# Patient Record
Sex: Female | Born: 1935 | Race: White | Hispanic: No | Smoking: Former smoker
Health system: Southern US, Community
[De-identification: ages and names within clinical notes are randomized; demographics above are authoritative.]

## PROBLEM LIST (undated history)

## (undated) DIAGNOSIS — E039 Hypothyroidism, unspecified: Secondary | ICD-10-CM

## (undated) DIAGNOSIS — Z952 Presence of prosthetic heart valve: Secondary | ICD-10-CM

## (undated) DIAGNOSIS — Z7901 Long term (current) use of anticoagulants: Secondary | ICD-10-CM

## (undated) DIAGNOSIS — M810 Age-related osteoporosis without current pathological fracture: Secondary | ICD-10-CM

## (undated) DIAGNOSIS — I639 Cerebral infarction, unspecified: Secondary | ICD-10-CM

## (undated) DIAGNOSIS — I517 Cardiomegaly: Secondary | ICD-10-CM

## (undated) DIAGNOSIS — F0150 Vascular dementia without behavioral disturbance: Secondary | ICD-10-CM

## (undated) DIAGNOSIS — I10 Essential (primary) hypertension: Secondary | ICD-10-CM

## (undated) DIAGNOSIS — I272 Pulmonary hypertension, unspecified: Secondary | ICD-10-CM

## (undated) DIAGNOSIS — I4821 Permanent atrial fibrillation: Secondary | ICD-10-CM

## (undated) DIAGNOSIS — N390 Urinary tract infection, site not specified: Secondary | ICD-10-CM

## (undated) HISTORY — PX: ABDOMINAL HYSTERECTOMY: SHX81

## (undated) HISTORY — PX: FRACTURE SURGERY: SHX138

## (undated) HISTORY — DX: Age-related osteoporosis without current pathological fracture: M81.0

## (undated) HISTORY — PX: MITRAL VALVE REPLACEMENT: SHX147

---

## 2015-07-06 DIAGNOSIS — N390 Urinary tract infection, site not specified: Secondary | ICD-10-CM

## 2015-07-06 HISTORY — DX: Urinary tract infection, site not specified: N39.0

## 2015-07-12 ENCOUNTER — Encounter (HOSPITAL_COMMUNITY): Payer: Self-pay

## 2015-07-12 ENCOUNTER — Emergency Department (HOSPITAL_COMMUNITY): Payer: Medicare Other

## 2015-07-12 ENCOUNTER — Inpatient Hospital Stay (HOSPITAL_COMMUNITY)
Admission: EM | Admit: 2015-07-12 | Discharge: 2015-07-14 | DRG: 690 | Disposition: A | Discharge status: Home / Self Care | Payer: Medicare Other | Attending: Internal Medicine | Admitting: Internal Medicine

## 2015-07-12 DIAGNOSIS — N390 Urinary tract infection, site not specified: Secondary | ICD-10-CM | POA: Diagnosis present

## 2015-07-12 DIAGNOSIS — I10 Essential (primary) hypertension: Secondary | ICD-10-CM | POA: Diagnosis present

## 2015-07-12 DIAGNOSIS — E860 Dehydration: Secondary | ICD-10-CM | POA: Diagnosis present

## 2015-07-12 DIAGNOSIS — R17 Unspecified jaundice: Secondary | ICD-10-CM | POA: Diagnosis present

## 2015-07-12 DIAGNOSIS — Z79899 Other long term (current) drug therapy: Secondary | ICD-10-CM

## 2015-07-12 DIAGNOSIS — R509 Fever, unspecified: Secondary | ICD-10-CM

## 2015-07-12 DIAGNOSIS — N179 Acute kidney failure, unspecified: Secondary | ICD-10-CM | POA: Diagnosis present

## 2015-07-12 DIAGNOSIS — D649 Anemia, unspecified: Secondary | ICD-10-CM | POA: Diagnosis present

## 2015-07-12 DIAGNOSIS — Z7901 Long term (current) use of anticoagulants: Secondary | ICD-10-CM | POA: Diagnosis not present

## 2015-07-12 DIAGNOSIS — F0150 Vascular dementia without behavioral disturbance: Secondary | ICD-10-CM | POA: Diagnosis present

## 2015-07-12 DIAGNOSIS — Z8673 Personal history of transient ischemic attack (TIA), and cerebral infarction without residual deficits: Secondary | ICD-10-CM

## 2015-07-12 DIAGNOSIS — B9620 Unspecified Escherichia coli [E. coli] as the cause of diseases classified elsewhere: Secondary | ICD-10-CM | POA: Diagnosis present

## 2015-07-12 DIAGNOSIS — D696 Thrombocytopenia, unspecified: Secondary | ICD-10-CM | POA: Diagnosis present

## 2015-07-12 DIAGNOSIS — Z952 Presence of prosthetic heart valve: Secondary | ICD-10-CM

## 2015-07-12 DIAGNOSIS — Z7902 Long term (current) use of antithrombotics/antiplatelets: Secondary | ICD-10-CM | POA: Diagnosis not present

## 2015-07-12 DIAGNOSIS — R112 Nausea with vomiting, unspecified: Secondary | ICD-10-CM | POA: Diagnosis present

## 2015-07-12 DIAGNOSIS — N289 Disorder of kidney and ureter, unspecified: Secondary | ICD-10-CM

## 2015-07-12 HISTORY — DX: Vascular dementia, unspecified severity, without behavioral disturbance, psychotic disturbance, mood disturbance, and anxiety: F01.50

## 2015-07-12 HISTORY — DX: Essential (primary) hypertension: I10

## 2015-07-12 HISTORY — DX: Hypothyroidism, unspecified: E03.9

## 2015-07-12 HISTORY — DX: Presence of prosthetic heart valve: Z95.2

## 2015-07-12 HISTORY — DX: Cerebral infarction, unspecified: I63.9

## 2015-07-12 LAB — COMPREHENSIVE METABOLIC PANEL
ALK PHOS: 67 U/L (ref 38–126)
ALT: 20 U/L (ref 14–54)
AST: 33 U/L (ref 15–41)
Albumin: 4 g/dL (ref 3.5–5.0)
Anion gap: 9 (ref 5–15)
BUN: 28 mg/dL — ABNORMAL HIGH (ref 6–20)
CALCIUM: 9.6 mg/dL (ref 8.9–10.3)
CO2: 28 mmol/L (ref 22–32)
CREATININE: 1.41 mg/dL — AB (ref 0.44–1.00)
Chloride: 99 mmol/L — ABNORMAL LOW (ref 101–111)
GFR, EST AFRICAN AMERICAN: 38 mL/min — AB (ref >60)
GFR, EST NON AFRICAN AMERICAN: 33 mL/min — AB (ref >60)
Glucose, Bld: 224 mg/dL — ABNORMAL HIGH (ref 65–99)
Potassium: 3.9 mmol/L (ref 3.5–5.1)
Sodium: 136 mmol/L (ref 135–145)
Total Bilirubin: 1.6 mg/dL — ABNORMAL HIGH (ref 0.3–1.2)
Total Protein: 6.8 g/dL (ref 6.5–8.1)

## 2015-07-12 LAB — URINALYSIS, ROUTINE W REFLEX MICROSCOPIC
BILIRUBIN URINE: NEGATIVE
Glucose, UA: NEGATIVE mg/dL
NITRITE: POSITIVE — AB
PH: 6 (ref 5.0–8.0)
Protein, ur: 100 mg/dL — AB
Specific Gravity, Urine: 1.02 (ref 1.005–1.030)

## 2015-07-12 LAB — LIPASE, BLOOD: LIPASE: 28 U/L (ref 11–51)

## 2015-07-12 LAB — URINE MICROSCOPIC-ADD ON

## 2015-07-12 LAB — LACTIC ACID, PLASMA: Lactic Acid, Venous: 2.5 mmol/L (ref 0.5–2.0)

## 2015-07-12 LAB — CBC WITH DIFFERENTIAL/PLATELET
Basophils Absolute: 0 10*3/uL (ref 0.0–0.1)
Basophils Relative: 0 %
EOS PCT: 0 %
Eosinophils Absolute: 0 10*3/uL (ref 0.0–0.7)
HEMATOCRIT: 37.5 % (ref 36.0–46.0)
HEMOGLOBIN: 12.4 g/dL (ref 12.0–15.0)
LYMPHS ABS: 0.7 10*3/uL (ref 0.7–4.0)
LYMPHS PCT: 5 %
MCH: 28.8 pg (ref 26.0–34.0)
MCHC: 33.1 g/dL (ref 30.0–36.0)
MCV: 87 fL (ref 78.0–100.0)
Monocytes Absolute: 0.9 10*3/uL (ref 0.1–1.0)
Monocytes Relative: 6 %
NEUTROS PCT: 89 %
Neutro Abs: 12.9 10*3/uL — ABNORMAL HIGH (ref 1.7–7.7)
Platelets: 141 10*3/uL — ABNORMAL LOW (ref 150–400)
RBC: 4.31 MIL/uL (ref 3.87–5.11)
RDW: 14.6 % (ref 11.5–15.5)
WBC: 14.5 10*3/uL — AB (ref 4.0–10.5)

## 2015-07-12 LAB — PROTIME-INR
INR: 2.34 — ABNORMAL HIGH (ref 0.00–1.49)
Prothrombin Time: 25.4 seconds — ABNORMAL HIGH (ref 11.6–15.2)

## 2015-07-12 LAB — TROPONIN I

## 2015-07-12 MED ORDER — WARFARIN SODIUM 2 MG PO TABS: 2.0000 mg | ORAL_TABLET | Freq: Every day | ORAL | Status: DC

## 2015-07-12 MED ORDER — LISINOPRIL 5 MG PO TABS
2.5000 mg | ORAL_TABLET | Freq: Every day | ORAL | Status: DC
Start: 2015-07-12 — End: 2015-07-12

## 2015-07-12 MED ORDER — CLOPIDOGREL BISULFATE 75 MG PO TABS
75.0000 mg | ORAL_TABLET | Freq: Every day | ORAL | Status: DC
Start: 2015-07-12 — End: 2015-07-12

## 2015-07-12 MED ORDER — LISINOPRIL 5 MG PO TABS
2.5000 mg | ORAL_TABLET | Freq: Every day | ORAL | Status: DC
  Administered 2015-07-13 – 2015-07-14 (×2): 2.5 mg via ORAL
  Filled 2015-07-12 (×2): qty 1

## 2015-07-12 MED ORDER — ACETAMINOPHEN 500 MG PO TABS
500.0000 mg | ORAL_TABLET | Freq: Four times a day (QID) | ORAL | Status: DC | PRN
Start: 2015-07-12 — End: 2015-07-14

## 2015-07-12 MED ORDER — SODIUM CHLORIDE 0.9 % IV SOLN: INTRAVENOUS | Status: DC

## 2015-07-12 MED ORDER — CIPROFLOXACIN IN D5W 400 MG/200ML IV SOLN
400.0000 mg | Freq: Two times a day (BID) | INTRAVENOUS | Status: DC
  Administered 2015-07-12: 400 mg via INTRAVENOUS

## 2015-07-12 MED ORDER — VITAMIN C 500 MG PO TABS
500.0000 mg | ORAL_TABLET | Freq: Two times a day (BID) | ORAL | Status: DC
  Administered 2015-07-12 – 2015-07-14 (×4): 500 mg via ORAL
  Filled 2015-07-12 (×8): qty 1

## 2015-07-12 MED ORDER — ATENOLOL 25 MG PO TABS
25.0000 mg | ORAL_TABLET | Freq: Every day | ORAL | Status: DC
  Administered 2015-07-13 – 2015-07-14 (×2): 25 mg via ORAL
  Filled 2015-07-12 (×2): qty 1

## 2015-07-12 MED ORDER — WARFARIN - PHARMACIST DOSING INPATIENT
Freq: Every day | Status: DC
  Administered 2015-07-13: 18:00:00

## 2015-07-12 MED ORDER — ATENOLOL 25 MG PO TABS: 25.0000 mg | ORAL_TABLET | Freq: Every day | ORAL | Status: DC

## 2015-07-12 MED ORDER — CLOPIDOGREL BISULFATE 75 MG PO TABS
75.0000 mg | ORAL_TABLET | Freq: Every day | ORAL | Status: DC
  Administered 2015-07-13 – 2015-07-14 (×2): 75 mg via ORAL
  Filled 2015-07-12 (×2): qty 1

## 2015-07-12 MED ORDER — WARFARIN SODIUM 2 MG PO TABS
2.0000 mg | ORAL_TABLET | Freq: Once | ORAL | Status: AC
  Administered 2015-07-12: 2 mg via ORAL
  Filled 2015-07-12 (×2): qty 1

## 2015-07-12 MED ORDER — CIPROFLOXACIN IN D5W 400 MG/200ML IV SOLN
400.0000 mg | INTRAVENOUS | Status: DC
  Administered 2015-07-13 – 2015-07-14 (×2): 400 mg via INTRAVENOUS
  Filled 2015-07-12 (×3): qty 200

## 2015-07-12 MED ORDER — PROMETHAZINE HCL 25 MG/ML IJ SOLN
12.5000 mg | Freq: Four times a day (QID) | INTRAMUSCULAR | Status: DC | PRN
  Administered 2015-07-12: 12.5 mg via INTRAVENOUS
  Filled 2015-07-12: qty 1

## 2015-07-12 MED ORDER — TRIAMTERENE-HCTZ 37.5-25 MG PO CAPS
1.0000 | ORAL_CAPSULE | Freq: Every day | ORAL | Status: DC
  Filled 2015-07-12 (×2): qty 1

## 2015-07-12 MED ORDER — AMLODIPINE BESYLATE 5 MG PO TABS
2.5000 mg | ORAL_TABLET | Freq: Every day | ORAL | Status: DC
  Administered 2015-07-13 – 2015-07-14 (×2): 2.5 mg via ORAL
  Filled 2015-07-12 (×2): qty 1

## 2015-07-12 MED ORDER — AMLODIPINE BESYLATE 5 MG PO TABS: 2.5000 mg | ORAL_TABLET | Freq: Every day | ORAL | Status: DC

## 2015-07-12 MED ORDER — LEVOTHYROXINE SODIUM 25 MCG PO TABS
125.0000 ug | ORAL_TABLET | Freq: Every day | ORAL | Status: DC
  Administered 2015-07-13 – 2015-07-14 (×2): 125 ug via ORAL
  Filled 2015-07-12 (×2): qty 1

## 2015-07-12 MED ORDER — B COMPLEX-C PO TABS
1.0000 | ORAL_TABLET | Freq: Every day | ORAL | Status: DC
  Administered 2015-07-13 – 2015-07-14 (×2): 1 via ORAL
  Filled 2015-07-12 (×5): qty 1

## 2015-07-12 MED ORDER — ACETAMINOPHEN 325 MG PO TABS
650.0000 mg | ORAL_TABLET | Freq: Once | ORAL | Status: AC
  Administered 2015-07-12: 650 mg via ORAL
  Filled 2015-07-12: qty 2

## 2015-07-12 MED ORDER — SODIUM CHLORIDE 0.9 % IV SOLN
INTRAVENOUS | Status: DC
  Administered 2015-07-12 – 2015-07-13 (×2): via INTRAVENOUS

## 2015-07-12 MED ORDER — DOCUSATE SODIUM 100 MG PO CAPS
100.0000 mg | ORAL_CAPSULE | Freq: Two times a day (BID) | ORAL | Status: DC
  Administered 2015-07-12 – 2015-07-14 (×4): 100 mg via ORAL
  Filled 2015-07-12 (×8): qty 1

## 2015-07-12 MED ORDER — ONDANSETRON HCL 4 MG/2ML IJ SOLN
4.0000 mg | Freq: Four times a day (QID) | INTRAMUSCULAR | Status: DC | PRN
  Administered 2015-07-12: 4 mg via INTRAVENOUS
  Filled 2015-07-12: qty 2

## 2015-07-12 NOTE — ED Provider Notes (Signed)
CSN: [id number]     Arrival date & time 07/12/15  1255 History   First MD Initiated Contact with Patient 07/12/15 1315     Chief Complaint  Patient presents with  . Fever  . Emesis  . Fatigue      Patient is a 81 y.o. female presenting with fever and vomiting. The history is provided by the patient and a caregiver. The history is limited by the condition of the patient (hx dementia).  Fever Associated symptoms: vomiting   Emesis Pt was seen at 1405. Per pt and her family, c/o gradual onset and persistence of constant home fever to "102" (PO) and several episodes of N/V since this morning. Family states pt has been generally weak for the past several days, worse today. No medications were given to tx pt's fever. Pt herself denies abd pain, no CP/SOB, no cough, no back pain. No reported falls, no focal motor weakness.    Past Medical History  Diagnosis Date  . Stroke ([place])   . Hypertension   . Vascular dementia    Past Surgical History  Procedure Laterality Date  . Mitral valve replacement    . Abdominal hysterectomy      Social History  Substance Use Topics  . Smoking status: Never Smoker   . Smokeless tobacco: None  . Alcohol Use: No    Review of Systems  Unable to perform ROS: Dementia  Constitutional: Positive for fever.  Gastrointestinal: Positive for vomiting.      Allergies  Penicillins  Home Medications   Prior to Admission medications   Medication Sig Start Date End Date Taking? Authorizing Provider  acetaminophen (TYLENOL) 500 MG tablet Take 500 mg by mouth every 6 (six) hours as needed for mild pain.   Yes Historical Provider, MD  amLODipine (NORVASC) 2.5 MG tablet Take 2.5 mg by mouth daily.   Yes Historical Provider, MD  atenolol (TENORMIN) 25 MG tablet Take 12.5 mg by mouth.   Yes Historical Provider, MD  b complex vitamins tablet Take 1 tablet by mouth daily.   Yes Historical Provider, MD  clopidogrel (PLAVIX) 75 MG tablet Take 75 mg by mouth  daily.   Yes Historical Provider, MD  docusate sodium (COLACE) 100 MG capsule Take 100 mg by mouth 2 (two) times daily.   Yes Historical Provider, MD  levothyroxine (SYNTHROID, LEVOTHROID) 125 MCG tablet Take 125 mcg by mouth daily before breakfast.   Yes Historical Provider, MD  lisinopril (PRINIVIL,ZESTRIL) 2.5 MG tablet Take 2.5 mg by mouth daily.   Yes Historical Provider, MD  triamterene-hydrochlorothiazide (DYAZIDE) 37.5-25 MG capsule Take 1 capsule by mouth daily.   Yes Historical Provider, MD  vitamin C (ASCORBIC ACID) 500 MG tablet Take 500 mg by mouth 2 (two) times daily.   Yes Historical Provider, MD  warfarin (COUMADIN) 2 MG tablet Take 2 mg by mouth daily. Pt takes 2mg  one day and 4mg  the next   Yes Historical Provider, MD   BP 124/89 mmHg  Pulse 70  Temp(Src) 101.9 F (38.8 C) (Rectal)  Resp 27  Ht 5\' 2"  (1.575 m)  Wt 135 lb (61.236 kg)  BMI 24.69 kg/m2  SpO2 95%   14:17 Orthostatic Vital Signs CS  Orthostatic Lying  - BP- Lying: 112/54 mmHg ; Pulse- Lying: 74  Orthostatic Sitting - BP- Sitting: 117/55 mmHg ; Pulse- Sitting: 68  Orthostatic Standing at 0 minutes - BP- Standing at 0 minutes: 110/70 mmHg ; Pulse- Standing at 0 minutes: 60  Physical Exam  1410: Physical examination:  Nursing notes reviewed; Vital signs and O2 SAT reviewed;  Constitutional: Well developed, Well nourished, In no acute distress; Head:  Normocephalic, atraumatic; Eyes: EOMI, PERRL, No scleral icterus; ENMT: Mouth and pharynx normal, Mucous membranes dry; Neck: Supple, Full range of motion. No meningeal signs.; Cardiovascular: Irregular irregular rate and rhythm, No gallop; Respiratory: Breath sounds clear & equal bilaterally, No wheezes.  Speaking full sentences with ease, Normal respiratory effort/excursion; Chest: Nontender, Movement normal; Abdomen: Soft, Nontender, Nondistended, Normal bowel sounds; Genitourinary: No CVA tenderness; Extremities: Pulses normal, No tenderness, No edema, No  calf edema or asymmetry.; Neuro: Awake, alert, confused per hx dementia. Major CN grossly intact. No facial droop. Speech clear. No gross focal motor deficits in extremities.; Skin: Color normal, Warm, Dry.   ED Course  Procedures (including critical care time)   Labs Review Imaging Review I have personally reviewed and evaluated these images and lab results as part of my medical decision-making.   EKG Interpretation   Date/Time:  Wednesday July 12 2015 13:30:01 EST Ventricular Rate:  63 PR Interval:    QRS Duration: 94 QT Interval:  398 QTC Calculation: 407 R Axis:   49 Text Interpretation:  Atrial fibrillation Anterior infarct, old Repol  abnrm suggests ischemia, lateral leads Artifact No old tracing to compare  Confirmed by [facility]  MD, Nunzio Cory [id number]) on 07/12/2015 2:09:49 PM      MDM  MDM Reviewed: nursing note and vitals Interpretation: labs, ECG and x-ray   Results for orders placed or performed during the hospital encounter of 07/12/15  CBC with Differential  Result Value Ref Range   WBC 14.5 (H) 4.0 - 10.5 K/uL   RBC 4.31 3.87 - 5.11 MIL/uL   Hemoglobin 12.4 12.0 - 15.0 g/dL   HCT 37.5 36.0 - 46.0 %   MCV 87.0 78.0 - 100.0 fL   MCH 28.8 26.0 - 34.0 pg   MCHC 33.1 30.0 - 36.0 g/dL   RDW 14.6 11.5 - 15.5 %   Platelets 141 (L) 150 - 400 K/uL   Neutrophils Relative % 89 %   Neutro Abs 12.9 (H) 1.7 - 7.7 K/uL   Lymphocytes Relative 5 %   Lymphs Abs 0.7 0.7 - 4.0 K/uL   Monocytes Relative 6 %   Monocytes Absolute 0.9 0.1 - 1.0 K/uL   Eosinophils Relative 0 %   Eosinophils Absolute 0.0 0.0 - 0.7 K/uL   Basophils Relative 0 %   Basophils Absolute 0.0 0.0 - 0.1 K/uL  Comprehensive metabolic panel  Result Value Ref Range   Sodium 136 135 - 145 mmol/L   Potassium 3.9 3.5 - 5.1 mmol/L   Chloride 99 (L) 101 - 111 mmol/L   CO2 28 22 - 32 mmol/L   Glucose, Bld 224 (H) 65 - 99 mg/dL   BUN 28 (H) 6 - 20 mg/dL   Creatinine, Ser 1.41 (H) 0.44 - 1.00 mg/dL    Calcium 9.6 8.9 - 10.3 mg/dL   Total Protein 6.8 6.5 - 8.1 g/dL   Albumin 4.0 3.5 - 5.0 g/dL   AST 33 15 - 41 U/L   ALT 20 14 - 54 U/L   Alkaline Phosphatase 67 38 - 126 U/L   Total Bilirubin 1.6 (H) 0.3 - 1.2 mg/dL   GFR calc non Af Amer 33 (L) >60 mL/min   GFR calc Af Amer 38 (L) >60 mL/min   Anion gap 9 5 - 15  Urinalysis, Routine w reflex microscopic (not at  [facility])  Result Value Ref Range   Color, Urine YELLOW YELLOW   APPearance HAZY (A) CLEAR   Specific Gravity, Urine 1.020 1.005 - 1.030   pH 6.0 5.0 - 8.0   Glucose, UA NEGATIVE NEGATIVE mg/dL   Hgb urine dipstick LARGE (A) NEGATIVE   Bilirubin Urine NEGATIVE NEGATIVE   Ketones, ur TRACE (A) NEGATIVE mg/dL   Protein, ur 100 (A) NEGATIVE mg/dL   Nitrite POSITIVE (A) NEGATIVE   Leukocytes, UA MODERATE (A) NEGATIVE  Lipase, blood  Result Value Ref Range   Lipase 28 11 - 51 U/L  Troponin I  Result Value Ref Range   Troponin I <0.03 <0.031 ng/mL  Lactic acid, plasma  Result Value Ref Range   Lactic Acid, Venous 2.5 (HH) 0.5 - 2.0 mmol/L  Protime-INR  Result Value Ref Range   Prothrombin Time 25.4 (H) 11.6 - 15.2 seconds   INR 2.34 (H) 0.00 - 1.49  Urine microscopic-add on  Result Value Ref Range   Squamous Epithelial / LPF 0-5 (A) NONE SEEN   WBC, UA TOO NUMEROUS TO COUNT 0 - 5 WBC/hpf   RBC / HPF 6-30 0 - 5 RBC/hpf   Bacteria, UA MANY (A) NONE SEEN   Dg Abd Acute W/chest 07/12/2015  CLINICAL DATA:  Generalize weakness, vomiting and fever since this morning EXAM: DG ABDOMEN ACUTE W/ 1V CHEST COMPARISON:  None in PACs FINDINGS: The lungs are adequately inflated and clear. The cardiac silhouette is mildly enlarged. The pulmonary vascularity is normal. There are 8 intact sternal wires. There is a valve ring in the mitral position. There is no pleural effusion. The observed bony thorax is unremarkable. Within the abdomen the bowel gas pattern is unremarkable. There is moderately increased colonic stool burden in the hepatic and  splenic flexure regions. There is some stool in the rectosigmoid. There is dense calcification in the wall of the abdominal aorta and within the splenic artery. There is moderate S shaped thoracolumbar scoliosis. IMPRESSION: 1. Mild enlargement of the cardiac silhouette without evidence of pneumonia nor other acute cardiopulmonary abnormality. Status post mitral valve replacement. 2. Mildly increase colonic stool burden may reflect constipation in the appropriate clinical setting. There is no evidence of a small or large bowel obstruction or perforation. Electronically Signed   By: David  Martinique M.D.   On: 07/12/2015 15:59    1630:  Tx UTI with IV rocephin while UC is pending. APAP given for fever. Not orthostatic on VS. Judicious IVF given for mildly elevated BUN/Cr (no old to compare) as well as lactic acid.  Dx and testing d/w pt and family.  Questions answered.  Verb understanding, agreeable to admit. T/C to [facility]. Anastasio Champion, case discussed, including:  HPI, pertinent PM/SHx, VS/PE, dx testing, ED course and treatment:  Agreeable to admit, requests to write temporary orders, obtain medical bed to team APAdmits.   Francine Graven, DO 07/18/15 [phone]

## 2015-07-12 NOTE — ED Notes (Signed)
Pt reports generalized weakness, fever, and vomiting since this morning.  Pt did not have any medication for fever.  Reports temp was 102 orally.

## 2015-07-12 NOTE — ED Notes (Signed)
Report attempted. Bed assignment changed by floor charge. Floor to call back.

## 2015-07-12 NOTE — ED Notes (Signed)
CRITICAL VALUE ALERT  Critical value received: Lactic Acid 2.5 Date of notification:  07/12/2015  Time of notification:  [id number]  Critical value read back:Yes.    Nurse who received alert:  Laurell Josephs RN  MD notified (1st page):  Alvino Chapel  Time of first page:  32  MD notified (2nd page):  Time of second page:  Responding MD:  Alvino Chapel  Time MD responded:  1450

## 2015-07-12 NOTE — Progress Notes (Signed)
ANTIBIOTIC CONSULT NOTE - INITIAL  Pharmacy Consult for Cipro Indication: UTI  Allergies  Allergen Reactions  . Penicillins Swelling    Whole body "swelled up" and had to go to the hospital    Patient Measurements: Height: 5\' 2"  (157.5 cm) Weight: 135 lb (61.236 kg) IBW/kg (Calculated) : 50.1  Vital Signs: Temp: 101 F (38.3 C) (12/07 1630) Temp Source: Rectal (12/07 1630) BP: 97/80 mmHg (12/07 1630) Pulse Rate: 88 (12/07 1630) Intake/Output from previous day:   Intake/Output from this shift:    Labs:  Recent Labs  07/12/15 1327  WBC 14.5*  HGB 12.4  PLT 141*  CREATININE 1.41*   Estimated Creatinine Clearance: 25.6 mL/min (by C-G formula based on Cr of 1.41). No results for input(s): VANCOTROUGH, VANCOPEAK, VANCORANDOM, GENTTROUGH, GENTPEAK, GENTRANDOM, TOBRATROUGH, TOBRAPEAK, TOBRARND, AMIKACINPEAK, AMIKACINTROU, AMIKACIN in the last 72 hours.   Microbiology: No results found for this or any previous visit (from the past 720 hour(s)).  Medical History: Past Medical History  Diagnosis Date  . Stroke ([place])   . Hypertension   . Vascular dementia     Medications:  See med rec   Assessment: 81 yo femal presenting with fever and vomiting. Fever 102, and several episodes of N/V since this morning morning.  UA consistent with UTI. No back pain noted. Treatment for UTI with cipro CrCl 28ml/min Goal of Therapy:  resolution of infection  Plan:  Cipro 400mg  IV q24h Follow up culture results  Monitor V/S and labs  Isac Sarna, BS Vena Austria, BCPS Clinical Pharmacist Pager [phone] 07/12/2015,4:58 PM

## 2015-07-12 NOTE — Progress Notes (Signed)
ANTICOAGULATION CONSULT NOTE - Initial Consult  Pharmacy Consult for Warfarin Indication: Mechanical valve  Allergies  Allergen Reactions  . Penicillins Swelling    Whole body "swelled up" and had to go to the hospital    Patient Measurements: Height: 5\' 2"  (157.5 cm) Weight: 135 lb (61.236 kg) IBW/kg (Calculated) : 50.1   Vital Signs: Temp: 101 F (38.3 C) (12/07 1630) Temp Source: Rectal (12/07 1630) BP: 97/80 mmHg (12/07 1630) Pulse Rate: 88 (12/07 1630)  Labs:  Recent Labs  07/12/15 1327  HGB 12.4  HCT 37.5  PLT 141*  LABPROT 25.4*  INR 2.34*  CREATININE 1.41*  TROPONINI <0.03    Estimated Creatinine Clearance: 25.6 mL/min (by C-G formula based on Cr of 1.41).   Medical History: Past Medical History  Diagnosis Date  . Stroke ([place])   . Hypertension   . Vascular dementia     Medications:   (Not in a hospital admission)  Assessment: 81 yo femal presenting with fever and vomiting. Fever 102, and several episodes of N/V since this morning morning.  Continuation of Warfarin PTA for mechanical valve  Goal of Therapy:  INR 2.5 - 3.5 Monitor platelets by anticoagulation protocol: Yes   Plan:  Coumadin 2 mg po x 1 dose tonight, home regiment INR/PT daily Monitor CBC, platelets  Abner Greenspan, Wilfred Dayrit Bennett 07/12/2015,5:04 PM

## 2015-07-12 NOTE — H&P (Signed)
[facility] History and Physical  Jakaiyah Nunnelee [id number] DOB: Nov 17, 1928 DOA: 07/12/2015  Referring physician: ER PCP: No primary care provider on file.   Chief Complaint: Fever, vomiting  HPI: Courtney [name] is a 81 y.o. female  This is an 81 year old lady who has vascular dementia and appears to be mildly demented presents this morning with gradual onset of nausea and vomiting associated with a fever of 102. She has generally been feeling weak. Her po intake has been poor. She denies any chest pain, abdominal pain, dyspnea or palpitations. She has had no falls. Evaluation in the emergency room shows her to have a UTI and degree of renal failure. She remains hemodynamically stable. She is now being admitted for further management.   Review of Systems:  Unable to obtain clear review of systems secondary to the patient's dementia.  Past Medical History  Diagnosis Date  . Stroke ([place])   . Hypertension   . Vascular dementia    Past Surgical History  Procedure Laterality Date  . Mitral valve replacement    . Abdominal hysterectomy     Social History:  reports that she has never smoked. She does not have any smokeless tobacco history on file. She reports that she does not drink alcohol or use illicit drugs.  Allergies  Allergen Reactions  . Penicillins Swelling    Whole body "swelled up" and had to go to the hospital     Family history: Unable to obtain secondary to the patient's dementia.  Prior to Admission medications   Medication Sig Start Date End Date Taking? Authorizing Provider  acetaminophen (TYLENOL) 500 MG tablet Take 500 mg by mouth every 6 (six) hours as needed for mild pain.   Yes Historical Provider, MD  amLODipine (NORVASC) 2.5 MG tablet Take 2.5 mg by mouth daily.   Yes Historical Provider, MD  atenolol (TENORMIN) 25 MG tablet Take 12.5 mg by mouth.   Yes Historical Provider, MD  b complex vitamins tablet Take 1 tablet by mouth daily.   Yes Historical  Provider, MD  clopidogrel (PLAVIX) 75 MG tablet Take 75 mg by mouth daily.   Yes Historical Provider, MD  docusate sodium (COLACE) 100 MG capsule Take 100 mg by mouth 2 (two) times daily.   Yes Historical Provider, MD  levothyroxine (SYNTHROID, LEVOTHROID) 125 MCG tablet Take 125 mcg by mouth daily before breakfast.   Yes Historical Provider, MD  lisinopril (PRINIVIL,ZESTRIL) 2.5 MG tablet Take 2.5 mg by mouth daily.   Yes Historical Provider, MD  triamterene-hydrochlorothiazide (DYAZIDE) 37.5-25 MG capsule Take 1 capsule by mouth daily.   Yes Historical Provider, MD  vitamin C (ASCORBIC ACID) 500 MG tablet Take 500 mg by mouth 2 (two) times daily.   Yes Historical Provider, MD  warfarin (COUMADIN) 2 MG tablet Take 2 mg by mouth daily. Pt takes 2mg  one day and 4mg  the next   Yes Historical Provider, MD   Physical Exam: Filed Vitals:   07/12/15 1420 07/12/15 1430 07/12/15 1511 07/12/15 1630  BP:  124/89 114/55 97/80  Pulse:  70 65 88  Temp: 101.9 F (38.8 C)   101 F (38.3 C)  TempSrc: Rectal   Rectal  Resp:  27 14 14   Height:      Weight:      SpO2:  95% 99% 100%    Wt Readings from Last 3 Encounters:  07/12/15 61.236 kg (135 lb)    General:  Appears calm and comfortable. She is nontoxic clinically. She is alert.  Eyes: PERRL, normal lids, irises & conjunctiva ENT: grossly normal hearing, lips & tongue Neck: no LAD, masses or thyromegaly Cardiovascular: RRR, no m/r/g. No LE edema. Telemetry: SR, no arrhythmias  Respiratory: CTA bilaterally, no w/r/r. Normal respiratory effort. Abdomen: soft, ntnd Skin: no rash or induration seen on limited exam Musculoskeletal: grossly normal tone BUE/BLE Psychiatric: grossly normal mood and affect, speech fluent and appropriate Neurologic: grossly non-focal.          Labs on Admission:  Basic Metabolic Panel:  Recent Labs Lab 07/12/15 1327  NA 136  K 3.9  CL 99*  CO2 28  GLUCOSE 224*  BUN 28*  CREATININE 1.41*  CALCIUM 9.6    Liver Function Tests:  Recent Labs Lab 07/12/15 1327  AST 33  ALT 20  ALKPHOS 67  BILITOT 1.6*  PROT 6.8  ALBUMIN 4.0    Recent Labs Lab 07/12/15 1327  LIPASE 28   No results for input(s): AMMONIA in the last 168 hours. CBC:  Recent Labs Lab 07/12/15 1327  WBC 14.5*  NEUTROABS 12.9*  HGB 12.4  HCT 37.5  MCV 87.0  PLT 141*   Cardiac Enzymes:  Recent Labs Lab 07/12/15 1327  TROPONINI <0.03    BNP (last 3 results) No results for input(s): BNP in the last 8760 hours.  ProBNP (last 3 results) No results for input(s): PROBNP in the last 8760 hours.  CBG: No results for input(s): GLUCAP in the last 168 hours.  Radiological Exams on Admission: Dg Abd Acute W/chest  07/12/2015  CLINICAL DATA:  Generalize weakness, vomiting and fever since this morning EXAM: DG ABDOMEN ACUTE W/ 1V CHEST COMPARISON:  None in PACs FINDINGS: The lungs are adequately inflated and clear. The cardiac silhouette is mildly enlarged. The pulmonary vascularity is normal. There are 8 intact sternal wires. There is a valve ring in the mitral position. There is no pleural effusion. The observed bony thorax is unremarkable. Within the abdomen the bowel gas pattern is unremarkable. There is moderately increased colonic stool burden in the hepatic and splenic flexure regions. There is some stool in the rectosigmoid. There is dense calcification in the wall of the abdominal aorta and within the splenic artery. There is moderate S shaped thoracolumbar scoliosis. IMPRESSION: 1. Mild enlargement of the cardiac silhouette without evidence of pneumonia nor other acute cardiopulmonary abnormality. Status post mitral valve replacement. 2. Mildly increase colonic stool burden may reflect constipation in the appropriate clinical setting. There is no evidence of a small or large bowel obstruction or perforation. Electronically Signed   By: David  Martinique M.D.   On: 07/12/2015 15:59       Assessment/Plan   1. UTI. She'll be treated with intravenous ciprofloxacin. 2. Dehydration. IV fluids. 3. Vascular dementia. Stable. 4. Hypertension. Continue without medications. 5. Status post mitral valve replacement. Continue with warfarin, per pharmacy.  She'll be admitted to the medical floor. Further recommendations will depend on patient's hospital progress.  Code Status: Full code.  DVT Prophylaxis: Warfarin.  Family Communication: I discussed the plan with the patient's son at the bedside.  Disposition Plan: Home when medically stable.  Time spent: 45 minutes.  Doree Albee [facility] Pager [phone].

## 2015-07-13 ENCOUNTER — Encounter (HOSPITAL_COMMUNITY): Payer: Self-pay | Admitting: Internal Medicine

## 2015-07-13 DIAGNOSIS — I10 Essential (primary) hypertension: Secondary | ICD-10-CM

## 2015-07-13 DIAGNOSIS — D696 Thrombocytopenia, unspecified: Secondary | ICD-10-CM

## 2015-07-13 DIAGNOSIS — Z7901 Long term (current) use of anticoagulants: Secondary | ICD-10-CM

## 2015-07-13 DIAGNOSIS — E860 Dehydration: Secondary | ICD-10-CM

## 2015-07-13 DIAGNOSIS — N179 Acute kidney failure, unspecified: Secondary | ICD-10-CM | POA: Diagnosis present

## 2015-07-13 DIAGNOSIS — N390 Urinary tract infection, site not specified: Principal | ICD-10-CM

## 2015-07-13 DIAGNOSIS — R112 Nausea with vomiting, unspecified: Secondary | ICD-10-CM | POA: Diagnosis present

## 2015-07-13 DIAGNOSIS — Z954 Presence of other heart-valve replacement: Secondary | ICD-10-CM

## 2015-07-13 DIAGNOSIS — F0150 Vascular dementia without behavioral disturbance: Secondary | ICD-10-CM

## 2015-07-13 DIAGNOSIS — Z952 Presence of prosthetic heart valve: Secondary | ICD-10-CM

## 2015-07-13 LAB — CBC
HCT: 35 % — ABNORMAL LOW (ref 36.0–46.0)
Hemoglobin: 11.3 g/dL — ABNORMAL LOW (ref 12.0–15.0)
MCH: 28.2 pg (ref 26.0–34.0)
MCHC: 32.3 g/dL (ref 30.0–36.0)
MCV: 87.3 fL (ref 78.0–100.0)
PLATELETS: 133 10*3/uL — AB (ref 150–400)
RBC: 4.01 MIL/uL (ref 3.87–5.11)
RDW: 14.8 % (ref 11.5–15.5)
WBC: 11.9 10*3/uL — AB (ref 4.0–10.5)

## 2015-07-13 LAB — COMPREHENSIVE METABOLIC PANEL
ALT: 30 U/L (ref 14–54)
ANION GAP: 7 (ref 5–15)
AST: 39 U/L (ref 15–41)
Albumin: 3.5 g/dL (ref 3.5–5.0)
Alkaline Phosphatase: 58 U/L (ref 38–126)
BUN: 30 mg/dL — AB (ref 6–20)
CALCIUM: 9 mg/dL (ref 8.9–10.3)
CO2: 28 mmol/L (ref 22–32)
CREATININE: 1.26 mg/dL — AB (ref 0.44–1.00)
Chloride: 102 mmol/L (ref 101–111)
GFR, EST AFRICAN AMERICAN: 44 mL/min — AB (ref >60)
GFR, EST NON AFRICAN AMERICAN: 38 mL/min — AB (ref >60)
Glucose, Bld: 117 mg/dL — ABNORMAL HIGH (ref 65–99)
Potassium: 3.8 mmol/L (ref 3.5–5.1)
Sodium: 137 mmol/L (ref 135–145)
Total Bilirubin: 1.6 mg/dL — ABNORMAL HIGH (ref 0.3–1.2)
Total Protein: 6.1 g/dL — ABNORMAL LOW (ref 6.5–8.1)

## 2015-07-13 LAB — PROTIME-INR
INR: 2.85 — ABNORMAL HIGH (ref 0.00–1.49)
PROTHROMBIN TIME: 29.4 s — AB (ref 11.6–15.2)

## 2015-07-13 MED ORDER — WARFARIN SODIUM 2 MG PO TABS
2.0000 mg | ORAL_TABLET | Freq: Once | ORAL | Status: AC
  Administered 2015-07-13: 2 mg via ORAL
  Filled 2015-07-13: qty 1

## 2015-07-13 NOTE — Progress Notes (Signed)
[facility] PROGRESS NOTE  Courtney [name] [id number] DOB: February 26, 1929 DOA: 07/12/2015 PCP: No primary care provider on file.    Code Status: Full code Family Communication: Family not available; discussed with patient Disposition Plan: Anticipate discharge to home in the next 24-48 hours.   Consultants:  None  Procedures:  None  Antibiotics:  Rocephin 07/12/15>>  HPI/Subjective: The patient has no complaints of nausea, vomiting, or abdominal pain.  Objective: Filed Vitals:   07/12/15 2034 07/13/15 0442  BP: 122/45 126/56  Pulse: 66 80  Temp: 99.3 F (37.4 C) 101.6 F (38.7 C)  Resp: 22 14   oxygen saturation 95% on room air.  Intake/Output Summary (Last 24 hours) at 07/13/15 1229 Last data filed at 07/13/15 0900  Gross per 24 hour  Intake    790 ml  Output    200 ml  Net    590 ml   Filed Weights   07/12/15 1311 07/12/15 1836  Weight: 61.236 kg (135 lb) 63.957 kg (141 lb)    Exam:   General:  Pleasant alert 81 year old woman in no acute distress.  Cardiovascular: S1, S2, no murmurs rubs or gallops.  Respiratory: Rare crackles in the bases, otherwise clear.  Abdomen: Positive bowel sounds, soft, nontender, nondistended.  Musculoskeletal/extremities: No pedal edema.  Neurologic: She is alert and oriented to herself and hospital. Her speech is clear. Cranial nerves are grossly intact.   Data Reviewed: Basic Metabolic Panel:  Recent Labs Lab 07/12/15 1327 07/13/15 0634  NA 136 137  K 3.9 3.8  CL 99* 102  CO2 28 28  GLUCOSE 224* 117*  BUN 28* 30*  CREATININE 1.41* 1.26*  CALCIUM 9.6 9.0   Liver Function Tests:  Recent Labs Lab 07/12/15 1327 07/13/15 0634  AST 33 39  ALT 20 30  ALKPHOS 67 58  BILITOT 1.6* 1.6*  PROT 6.8 6.1*  ALBUMIN 4.0 3.5    Recent Labs Lab 07/12/15 1327  LIPASE 28   No results for input(s): AMMONIA in the last 168 hours. CBC:  Recent Labs Lab 07/12/15 1327 07/13/15 0634  WBC 14.5* 11.9*   NEUTROABS 12.9*  --   HGB 12.4 11.3*  HCT 37.5 35.0*  MCV 87.0 87.3  PLT 141* 133*   Cardiac Enzymes:  Recent Labs Lab 07/12/15 1327  TROPONINI <0.03   BNP (last 3 results) No results for input(s): BNP in the last 8760 hours.  ProBNP (last 3 results) No results for input(s): PROBNP in the last 8760 hours.  CBG: No results for input(s): GLUCAP in the last 168 hours.  Recent Results (from the past 240 hour(s))  Urine culture     Status: None (Preliminary result)   Collection Time: 07/12/15  1:44 PM  Result Value Ref Range Status   Specimen Description URINE, CLEAN CATCH  Final   Special Requests NONE  Final   Culture   Final    >=100,000 COLONIES/mL ESCHERICHIA COLI Performed at [facility]    Report Status PENDING  Incomplete  Blood culture (routine x 2)     Status: None (Preliminary result)   Collection Time: 07/12/15  1:58 PM  Result Value Ref Range Status   Specimen Description BLOOD RIGHT ARM  Final   Special Requests BOTTLES DRAWN AEROBIC AND ANAEROBIC 8CC EACH  Final   Culture NO GROWTH < 24 HOURS  Final   Report Status PENDING  Incomplete  Blood culture (routine x 2)     Status: None (Preliminary result)   Collection Time: 07/12/15  5:00 PM  Result Value Ref Range Status   Specimen Description BLOOD RIGHT ARM  Final   Special Requests   Final    BOTTLES DRAWN AEROBIC AND ANAEROBIC AEB=6CC ANA=8CC   Culture NO GROWTH < 24 HOURS  Final   Report Status PENDING  Incomplete     Studies: Dg Abd Acute W/chest  07/12/2015  CLINICAL DATA:  Generalize weakness, vomiting and fever since this morning EXAM: DG ABDOMEN ACUTE W/ 1V CHEST COMPARISON:  None in PACs FINDINGS: The lungs are adequately inflated and clear. The cardiac silhouette is mildly enlarged. The pulmonary vascularity is normal. There are 8 intact sternal wires. There is a valve ring in the mitral position. There is no pleural effusion. The observed bony thorax is unremarkable. Within the abdomen  the bowel gas pattern is unremarkable. There is moderately increased colonic stool burden in the hepatic and splenic flexure regions. There is some stool in the rectosigmoid. There is dense calcification in the wall of the abdominal aorta and within the splenic artery. There is moderate S shaped thoracolumbar scoliosis. IMPRESSION: 1. Mild enlargement of the cardiac silhouette without evidence of pneumonia nor other acute cardiopulmonary abnormality. Status post mitral valve replacement. 2. Mildly increase colonic stool burden may reflect constipation in the appropriate clinical setting. There is no evidence of a small or large bowel obstruction or perforation. Electronically Signed   By: David  Martinique M.D.   On: 07/12/2015 15:59    Scheduled Meds: . amLODipine  2.5 mg Oral Daily  . atenolol  25 mg Oral Daily  . B-complex with vitamin C  1 tablet Oral Daily  . ciprofloxacin  400 mg Intravenous Q24H  . clopidogrel  75 mg Oral Daily  . docusate sodium  100 mg Oral BID  . levothyroxine  125 mcg Oral QAC breakfast  . lisinopril  2.5 mg Oral Daily  . vitamin C  500 mg Oral BID  . warfarin  2 mg Oral Once  . Warfarin - Pharmacist Dosing Inpatient   Does not apply q1800   Continuous Infusions: . sodium chloride 100 mL/hr at 07/12/15 1430   Assessment and plan:  Principal Problem:   Nausea and vomiting Active Problems:   UTI (lower urinary tract infection)   Dehydration   Vascular dementia   Essential hypertension   Thrombocytopenia (HCC)   Chronic anticoagulation   H/O mitral valve replacement   AKI (acute kidney injury) ([place])    1. Nausea and vomiting, likely secondary to urinary tract infection. On admission, the patient was febrile with a temperature of 101.9. She was hemostatically stable and was oxygenating in the 90s on room air. Acute abdominal series revealed mildly increase colonic stool burden reflecting constipation, but no bowel obstruction or perforation. Her liver  transaminases were within normal limits, but her total bilirubin was slightly elevated at 1.6. Her lipase was within normal limits. Her urinalysis was consistent with UTI. -A urine culture was ordered and she was started on Rocephin. Zofran was ordered as needed for nausea. Maintenance IV fluids were ordered for mild dehydration. -The patient is clinically improved. Her diet has been advanced when she is tolerating well - Will await the urine culture.  Dehydration with acute kidney injury. The patient's baseline creatinine is unknown, but it was 1.41 on admission. She is treated chronically with Maxide and lisinopril. Maxzide was discontinued. Lisinopril will be continued. IV fluids were started and will be continued. -Her creatinine has improved. We'll continue to monitor.  Essential hypertension. The  patient is treated chronically with amlodipine, Maxide, and lisinopril. Because of her mild dehydration and AKI, Maxide was discontinued. We'll continue amlodipine and lisinopril. Her blood pressure is reasonable, but will consider increasing the dose of amlodipine off of Maxide if her blood pressure becomes uncontrolled.  History of aortic valve replacement on chronic anticoagulation with Coumadin. Patient's troponin I was negative. Her INR was therapeutic on admission. We'll continue Coumadin per pharmacy.  Vascular dementia. The patient is treated with Plavix and Coumadin. She is certainly at risk of bleeding, but apparently these are her chronic medications. They will be continued with caution.  Thrombocytopenia. The patient's baseline platelet count is unknown. It was 141 on admission. With IV fluid hydration, it has fallen to 133. The etiology of her thrombocytopenia is unknown, but could be chronic from antiplatelet therapy or acute from infection. We'll continue to monitor.   Time spent: 35 minutes    [facility] Pager [phone]. If 7PM-7AM, please contact  night-coverage at [email or web address], password [facility] 07/13/2015, 12:29 PM  LOS: 1 day

## 2015-07-13 NOTE — Care Management Note (Signed)
Case Management Note  Patient Details  Name: Courtney [name] MRN: [id number] Date of Birth: 1929/02/01  Subjective/Objective:                  Pt admitted from home with nausea and vomiting. Pt lives with her son and will return home at discharge. Pt is independent with ADL's.  Action/Plan: No CM needs noted.  Expected Discharge Date:  07/14/15               Expected Discharge Plan:  Home/Self Care  In-House Referral:  NA  Discharge planning Services  CM Consult  Post Acute Care Choice:  NA Choice offered to:  NA  DME Arranged:    DME Agency:     HH Arranged:    HH Agency:     Status of Service:  Completed, signed off  Medicare Important Message Given:    Date Medicare IM Given:    Medicare IM give by:    Date Additional Medicare IM Given:    Additional Medicare Important Message give by:     If discussed at [place] of Stay Meetings, dates discussed:    Additional Comments:  Joylene Draft, RN 07/13/2015, 1:34 PM

## 2015-07-13 NOTE — Progress Notes (Signed)
ANTICOAGULATION CONSULT NOTE - Initial Consult  Pharmacy Consult for Warfarin Indication: Mechanical valve  Allergies  Allergen Reactions  . Penicillins Swelling    Whole body "swelled up" and had to go to the hospital    Patient Measurements: Height: 5\' 2"  (157.5 cm) Weight: 141 lb (63.957 kg) IBW/kg (Calculated) : 50.1   Vital Signs: Temp: 101.6 F (38.7 C) (12/08 0442) Temp Source: Oral (12/08 0442) BP: 126/56 mmHg (12/08 0442) Pulse Rate: 80 (12/08 0442)  Labs:  Recent Labs  07/12/15 1327 07/13/15 0634  HGB 12.4 11.3*  HCT 37.5 35.0*  PLT 141* 133*  LABPROT 25.4* 29.4*  INR 2.34* 2.85*  CREATININE 1.41* 1.26*  TROPONINI <0.03  --     Estimated Creatinine Clearance: 29.2 mL/min (by C-G formula based on Cr of 1.26).   Medical History: Past Medical History  Diagnosis Date  . Stroke ([place])   . Hypertension   . Vascular dementia   . Hypothyroidism   . History of mitral valve replacement     Medications:  Prescriptions prior to admission  Medication Sig Dispense Refill Last Dose  . acetaminophen (TYLENOL) 500 MG tablet Take 500 mg by mouth every 6 (six) hours as needed for mild pain.   Past Week at Unknown time  . amLODipine (NORVASC) 2.5 MG tablet Take 2.5 mg by mouth daily.   07/12/2015 at 1200  . atenolol (TENORMIN) 25 MG tablet Take 12.5 mg by mouth.   07/12/2015 at Unknown time  . b complex vitamins tablet Take 1 tablet by mouth daily.   07/12/2015 at Unknown time  . clopidogrel (PLAVIX) 75 MG tablet Take 75 mg by mouth daily.   07/12/2015 at Unknown time  . docusate sodium (COLACE) 100 MG capsule Take 100 mg by mouth 2 (two) times daily.   Past Week at Unknown time  . levothyroxine (SYNTHROID, LEVOTHROID) 125 MCG tablet Take 125 mcg by mouth daily before breakfast.   07/12/2015 at Unknown time  . lisinopril (PRINIVIL,ZESTRIL) 2.5 MG tablet Take 2.5 mg by mouth daily.   07/12/2015 at 1200  . triamterene-hydrochlorothiazide (DYAZIDE) 37.5-25 MG capsule Take 1  capsule by mouth daily.   07/12/2015 at Unknown time  . vitamin C (ASCORBIC ACID) 500 MG tablet Take 500 mg by mouth 2 (two) times daily.   07/12/2015 at Unknown time  . warfarin (COUMADIN) 2 MG tablet Take 2 mg by mouth daily. Pt takes 2mg  one day and 4mg  the next   07/12/2015 at [place] time    Assessment: 81 yo femal presenting with fever and vomiting. Fever 102, and several episodes of N/V since this morning morning. Pt alternates 2mg  and 4mg  at home. INR 2.85 today. In lieu of cipro initiation, will continue with 2mg  today of coumadin in anticipation that INR will rise. Platelets 133 but stable. Continuation of Warfarin PTA for mechanical valve  Goal of Therapy:  INR 2.5 - 3.5 Monitor platelets by anticoagulation protocol: Yes   Plan:  Coumadin 2 mg po x 1 dose tonight INR/PT daily Monitor CBC, platelets  Trenton Gammon, Mars Scheaffer L 07/13/2015,8:23 AM

## 2015-07-14 DIAGNOSIS — N390 Urinary tract infection, site not specified: Secondary | ICD-10-CM | POA: Insufficient documentation

## 2015-07-14 LAB — BASIC METABOLIC PANEL
ANION GAP: 7 (ref 5–15)
BUN: 28 mg/dL — AB (ref 6–20)
CALCIUM: 8.8 mg/dL — AB (ref 8.9–10.3)
CO2: 27 mmol/L (ref 22–32)
CREATININE: 1.06 mg/dL — AB (ref 0.44–1.00)
Chloride: 102 mmol/L (ref 101–111)
GFR calc Af Amer: 54 mL/min — ABNORMAL LOW (ref >60)
GFR calc non Af Amer: 47 mL/min — ABNORMAL LOW (ref >60)
GLUCOSE: 101 mg/dL — AB (ref 65–99)
Potassium: 3.5 mmol/L (ref 3.5–5.1)
Sodium: 136 mmol/L (ref 135–145)

## 2015-07-14 LAB — CBC
HCT: 33.3 % — ABNORMAL LOW (ref 36.0–46.0)
Hemoglobin: 10.9 g/dL — ABNORMAL LOW (ref 12.0–15.0)
MCH: 28.7 pg (ref 26.0–34.0)
MCHC: 32.7 g/dL (ref 30.0–36.0)
MCV: 87.6 fL (ref 78.0–100.0)
PLATELETS: 143 10*3/uL — AB (ref 150–400)
RBC: 3.8 MIL/uL — ABNORMAL LOW (ref 3.87–5.11)
RDW: 14.9 % (ref 11.5–15.5)
WBC: 10.2 10*3/uL (ref 4.0–10.5)

## 2015-07-14 LAB — PROTIME-INR
INR: 2.69 — AB (ref 0.00–1.49)
PROTHROMBIN TIME: 28.2 s — AB (ref 11.6–15.2)

## 2015-07-14 LAB — URINE CULTURE: Culture: >=100000

## 2015-07-14 MED ORDER — WARFARIN SODIUM 2 MG PO TABS: 4.0000 mg | ORAL_TABLET | Freq: Once | ORAL | Status: DC

## 2015-07-14 MED ORDER — CIPROFLOXACIN HCL 500 MG PO TABS: 500.0000 mg | ORAL_TABLET | Freq: Two times a day (BID) | ORAL | Status: DC

## 2015-07-14 NOTE — Progress Notes (Signed)
Patient with orders to be discharged home. Discharge instructions given, patient and son verbalized understanding. Prescriptions given. Patient stable. Patient left in private vehicle.

## 2015-07-14 NOTE — Progress Notes (Signed)
ANTICOAGULATION CONSULT NOTE   Pharmacy Consult for Warfarin Indication: Mechanical valve  Allergies  Allergen Reactions  . Penicillins Swelling    Whole body "swelled up" and had to go to the hospital    Patient Measurements: Height: 5\' 2"  (157.5 cm) Weight: 141 lb (63.957 kg) IBW/kg (Calculated) : 50.1   Vital Signs: Temp: 98.6 F (37 C) (12/09 0504) Temp Source: Oral (12/09 0504) BP: 131/67 mmHg (12/09 0504) Pulse Rate: 84 (12/09 0504)  Labs:  Recent Labs  07/12/15 1327 07/13/15 0634 07/14/15 0445  HGB 12.4 11.3* 10.9*  HCT 37.5 35.0* 33.3*  PLT 141* 133* 143*  LABPROT 25.4* 29.4* 28.2*  INR 2.34* 2.85* 2.69*  CREATININE 1.41* 1.26* 1.06*  TROPONINI <0.03  --   --     Estimated Creatinine Clearance: 34.7 mL/min (by C-G formula based on Cr of 1.06).   Medical History: Past Medical History  Diagnosis Date  . Stroke ([place])   . Hypertension   . Vascular dementia   . Hypothyroidism   . History of mitral valve replacement     Medications:  Prescriptions prior to admission  Medication Sig Dispense Refill Last Dose  . acetaminophen (TYLENOL) 500 MG tablet Take 500 mg by mouth every 6 (six) hours as needed for mild pain.   Past Week at Unknown time  . amLODipine (NORVASC) 2.5 MG tablet Take 2.5 mg by mouth daily.   07/12/2015 at 1200  . atenolol (TENORMIN) 25 MG tablet Take 12.5 mg by mouth.   07/12/2015 at Unknown time  . b complex vitamins tablet Take 1 tablet by mouth daily.   07/12/2015 at Unknown time  . clopidogrel (PLAVIX) 75 MG tablet Take 75 mg by mouth daily.   07/12/2015 at Unknown time  . docusate sodium (COLACE) 100 MG capsule Take 100 mg by mouth 2 (two) times daily.   Past Week at Unknown time  . levothyroxine (SYNTHROID, LEVOTHROID) 125 MCG tablet Take 125 mcg by mouth daily before breakfast.   07/12/2015 at Unknown time  . lisinopril (PRINIVIL,ZESTRIL) 2.5 MG tablet Take 2.5 mg by mouth daily.   07/12/2015 at 1200  . triamterene-hydrochlorothiazide  (DYAZIDE) 37.5-25 MG capsule Take 1 capsule by mouth daily.   07/12/2015 at Unknown time  . vitamin C (ASCORBIC ACID) 500 MG tablet Take 500 mg by mouth 2 (two) times daily.   07/12/2015 at Unknown time  . warfarin (COUMADIN) 2 MG tablet Take 2 mg by mouth daily. Pt takes 2mg  one day and 4mg  the next   07/12/2015 at [place] time    Assessment: 81 yo femal presenting with fever and vomiting to continue coumadin for mechanical valve.  Goal of Therapy:  INR 2.5 - 3.5 Monitor platelets by anticoagulation protocol: Yes   Plan:  Coumadin 4 mg po x 1 dose tonight INR/PT daily Monitor CBC, platelets  Electra Paladino Poteet 07/14/2015,9:22 AM

## 2015-07-14 NOTE — Discharge Summary (Addendum)
Physician Discharge Summary  Courtney [name] [id number] DOB: 09-Mar-1929 DOA: 07/12/2015  PCP: No primary care provider on file. (her PCP is in [place] date: 07/12/2015 Discharge date: 07/14/2015  Time spent: Greater than 30 minutes  Recommendations for Outpatient Follow-up:  1. Recommend recheck of her INR in 1 week or less. Also recommend follow-up of her platelet count and hemoglobin/hematocrit.    Discharge Diagnoses:  1. Escherichia coli urinary tract infection. 2. Nausea and vomiting secondary to UTI. Resolved. 3. Dehydration with acute kidney injury, secondary to prerenal azotemia. 4. Central hypertension. 5. History of aortic valve replacement on chronic anticoagulation with Coumadin. 6. Vascular dementia. 7. Thrombocytopenia. 8. Normocytic anemia. 9. Mild hyperbilirubinemia.   Discharge Condition: Improved.  Diet recommendation: Heart healthy.  Filed Weights   07/12/15 1311 07/12/15 1836  Weight: 61.236 kg (135 lb) 63.957 kg (141 lb)    History of present illness:  The patient is an 81 year old woman with a history of vascular dementia, aortic valve replacement on Coumadin, and hypertension, who presented to the emergency department on 07/12/2015 with a chief complaint of fever and vomiting. During evaluation in the ED, she was found to have a urinary tract infection. She was admitted for further evaluation and management.  Hospital Course:   1. Nausea and vomiting, likely secondary to urinary tract infection. On admission, the patient was febrile with a temperature of 101.9. She was hemostatically stable and was oxygenating in the 90s on room air. Acute abdominal series revealed mildly increase colonic stool burden reflecting constipation, but no bowel obstruction or perforation. Her liver transaminases were within normal limits, but her total bilirubin was slightly elevated at 1.6. Her lipase was within normal limits. Her urinalysis was consistent with  UTI. -A urine culture was ordered and she was started on Rocephin. Antibiotic therapy was changed to Cipro IV. Zofran was ordered as needed for nausea. Maintenance IV fluids were ordered for mild dehydration. -The patient improved clinically and symptomatically. Her diet was advanced which he tolerated well. Her urine culture grew out Escherichia coli, sensitive to both Rocephin and Cipro. -She was discharged on several more days of Cipro.  Dehydration with acute kidney injury. The patient's baseline creatinine was unknown, but it was 1.41 on admission. She is treated chronically with Maxide and lisinopril. Maxzide was withheld.  Lisinopril was  continued. IV fluids were started and will be continued. -Her creatinine has improved and was 1.06 at the time of discharge.   Essential hypertension. The patient is treated chronically with amlodipine, Maxide, and lisinopril. Because of her mild dehydration and AKI, Maxide was discontinued. She was continued on  amlodipine and lisinopril. Her blood pressure is improved. Maxide was restarted upon discharge.   History of aortic valve replacement on chronic anticoagulation with Coumadin. Patient's troponin I was negative. Her INR was therapeutic on admission and remained therapeutic.   Vascular dementia. The patient is treated with Plavix, but is also on Coumadin for aortic valve replacement.. She is certainly at risk of bleeding, but apparently these are her chronic medications and she was continued on them. This was discussed with her son who concurred.   Thrombocytopenia. The patient's baseline platelet count is unknown. It was 141 on admission. With IV fluid hydration, it has fell  to 133. The etiology of her thrombocytopenia is unknown, but could be chronic from antiplatelet therapy or acute from infection.Her platelet count did improve to 143 at the time of discharge.   Normocytic anemia. The patient's hemoglobin was 12.4  and admission. With IV  fluid hydration, it fell gradually to 10.9 at the time of discharge. There is no evidence of GI or GU bleeding. The decrease in her hemoglobin was likely hemodilution from IV fluids. Would recommend outpatient monitoring.    Procedures:  None   Consultations:  None  Discharge Exam: Filed Vitals:   07/13/15 1500 07/14/15 0504  BP: 122/60 131/67  Pulse: 78 84  Temp: 98.9 F (37.2 C) 98.6 F (37 C)  Resp: 16 16   oxygen saturation 95% on room air.  General: Pleasant alert 81 year old woman in no acute distress.  Cardiovascular: S1, S2, with an S2 click.  Respiratory: Clear to auscultation bilaterally.  Abdomen: Positive bowel sounds, soft, nontender, nondistended.  Musculoskeletal/extremities: No pedal edema.  Neurologic: She is alert and oriented to herself and hospital. Her speech is clear. Cranial nerves are grossly intact.  Discharge Instructions   Discharge Instructions    Diet - low sodium heart healthy    Complete by:  As directed      Increase activity slowly    Complete by:  As directed           Current Discharge Medication List    START taking these medications   Details  ciprofloxacin (CIPRO) 500 MG tablet Take 1 tablet (500 mg total) by mouth 2 (two) times daily. Antibiotic be taken for 3 more days, starting tomorrow. Qty: 6 tablet, Refills: 0      CONTINUE these medications which have NOT CHANGED   Details  acetaminophen (TYLENOL) 500 MG tablet Take 500 mg by mouth every 6 (six) hours as needed for mild pain.    amLODipine (NORVASC) 2.5 MG tablet Take 2.5 mg by mouth daily.    atenolol (TENORMIN) 25 MG tablet Take 12.5 mg by mouth.    b complex vitamins tablet Take 1 tablet by mouth daily.    clopidogrel (PLAVIX) 75 MG tablet Take 75 mg by mouth daily.    docusate sodium (COLACE) 100 MG capsule Take 100 mg by mouth 2 (two) times daily.    levothyroxine (SYNTHROID, LEVOTHROID) 125 MCG tablet Take 125 mcg by mouth daily before breakfast.     lisinopril (PRINIVIL,ZESTRIL) 2.5 MG tablet Take 2.5 mg by mouth daily.    triamterene-hydrochlorothiazide (DYAZIDE) 37.5-25 MG capsule Take 1 capsule by mouth daily.    vitamin C (ASCORBIC ACID) 500 MG tablet Take 500 mg by mouth 2 (two) times daily.    warfarin (COUMADIN) 2 MG tablet Take 2 mg by mouth daily. Pt takes 2mg  one day and 4mg  the next       Allergies  Allergen Reactions  . Penicillins Swelling    Whole body "swelled up" and had to go to the hospital   Follow-up Information    Please follow up.   Why:  Follow-up with your primary care physician in one week to have your INR blood test rechecked and for hospital follow-up of acute UTI.       The results of significant diagnostics from this hospitalization (including imaging, microbiology, ancillary and laboratory) are listed below for reference.    Significant Diagnostic Studies: Dg Abd Acute W/chest  07/12/2015  CLINICAL DATA:  Generalize weakness, vomiting and fever since this morning EXAM: DG ABDOMEN ACUTE W/ 1V CHEST COMPARISON:  None in PACs FINDINGS: The lungs are adequately inflated and clear. The cardiac silhouette is mildly enlarged. The pulmonary vascularity is normal. There are 8 intact sternal wires. There is a valve ring in the mitral  position. There is no pleural effusion. The observed bony thorax is unremarkable. Within the abdomen the bowel gas pattern is unremarkable. There is moderately increased colonic stool burden in the hepatic and splenic flexure regions. There is some stool in the rectosigmoid. There is dense calcification in the wall of the abdominal aorta and within the splenic artery. There is moderate S shaped thoracolumbar scoliosis. IMPRESSION: 1. Mild enlargement of the cardiac silhouette without evidence of pneumonia nor other acute cardiopulmonary abnormality. Status post mitral valve replacement. 2. Mildly increase colonic stool burden may reflect constipation in the appropriate clinical  setting. There is no evidence of a small or large bowel obstruction or perforation. Electronically Signed   By: David  Martinique M.D.   On: 07/12/2015 15:59    Microbiology: Recent Results (from the past 240 hour(s))  Urine culture     Status: None   Collection Time: 07/12/15  1:44 PM  Result Value Ref Range Status   Specimen Description URINE, CLEAN CATCH  Final   Special Requests NONE  Final   Culture   Final    >=100,000 COLONIES/mL ESCHERICHIA COLI Performed at [facility]    Report Status 07/14/2015 FINAL  Final   Organism ID, Bacteria ESCHERICHIA COLI  Final      Susceptibility   Escherichia coli - MIC*    AMPICILLIN <=2 SENSITIVE Sensitive     CEFAZOLIN <=4 SENSITIVE Sensitive     CEFTRIAXONE <=1 SENSITIVE Sensitive     CIPROFLOXACIN <=0.25 SENSITIVE Sensitive     GENTAMICIN <=1 SENSITIVE Sensitive     IMIPENEM <=0.25 SENSITIVE Sensitive     NITROFURANTOIN <=16 SENSITIVE Sensitive     TRIMETH/SULFA <=20 SENSITIVE Sensitive     AMPICILLIN/SULBACTAM <=2 SENSITIVE Sensitive     PIP/TAZO <=4 SENSITIVE Sensitive     * >=100,000 COLONIES/mL ESCHERICHIA COLI  Blood culture (routine x 2)     Status: None (Preliminary result)   Collection Time: 07/12/15  1:58 PM  Result Value Ref Range Status   Specimen Description BLOOD RIGHT ARM  Final   Special Requests BOTTLES DRAWN AEROBIC AND ANAEROBIC 8CC EACH  Final   Culture NO GROWTH < 24 HOURS  Final   Report Status PENDING  Incomplete  Blood culture (routine x 2)     Status: None (Preliminary result)   Collection Time: 07/12/15  5:00 PM  Result Value Ref Range Status   Specimen Description BLOOD RIGHT ARM  Final   Special Requests   Final    BOTTLES DRAWN AEROBIC AND ANAEROBIC AEB=6CC ANA=8CC   Culture NO GROWTH < 24 HOURS  Final   Report Status PENDING  Incomplete     Labs: Basic Metabolic Panel:  Recent Labs Lab 07/12/15 1327 07/13/15 0634 07/14/15 0445  NA 136 137 136  K 3.9 3.8 3.5  CL 99* 102 102  CO2 28 28  27   GLUCOSE 224* 117* 101*  BUN 28* 30* 28*  CREATININE 1.41* 1.26* 1.06*  CALCIUM 9.6 9.0 8.8*   Liver Function Tests:  Recent Labs Lab 07/12/15 1327 07/13/15 0634  AST 33 39  ALT 20 30  ALKPHOS 67 58  BILITOT 1.6* 1.6*  PROT 6.8 6.1*  ALBUMIN 4.0 3.5    Recent Labs Lab 07/12/15 1327  LIPASE 28   No results for input(s): AMMONIA in the last 168 hours. CBC:  Recent Labs Lab 07/12/15 1327 07/13/15 0634 07/14/15 0445  WBC 14.5* 11.9* 10.2  NEUTROABS 12.9*  --   --   HGB 12.4  11.3* 10.9*  HCT 37.5 35.0* 33.3*  MCV 87.0 87.3 87.6  PLT 141* 133* 143*   Cardiac Enzymes:  Recent Labs Lab 07/12/15 1327  TROPONINI <0.03   BNP: BNP (last 3 results) No results for input(s): BNP in the last 8760 hours.  ProBNP (last 3 results) No results for input(s): PROBNP in the last 8760 hours.  CBG: No results for input(s): GLUCAP in the last 168 hours.     Signed:  Ernestyne Caldwell  [facility] 07/14/2015, 2:04 PM

## 2015-07-14 NOTE — Care Management Important Message (Signed)
Important Message  Patient Details  Name: Kayley Lolli MRN: [id number] Date of Birth: 04/05/1929   Medicare Important Message Given:  N/A - LOS <3 / Initial given by admissions    Joylene Draft, RN 07/14/2015, 2:02 PM

## 2015-07-14 NOTE — Care Management Note (Signed)
Case Management Note  Patient Details  Name: Courtney [name] MRN: [id number] Date of Birth: 10/13/1928  Subjective/Objective:                    Action/Plan:   Expected Discharge Date:  07/14/15               Expected Discharge Plan:  Home/Self Care  In-House Referral:  NA  Discharge planning Services  CM Consult  Post Acute Care Choice:  NA Choice offered to:  NA  DME Arranged:    DME Agency:     HH Arranged:    [place] Agency:     Status of Service:  Completed, signed off  Medicare Important Message Given:  N/A - LOS <3 / Initial given by admissions Date Medicare IM Given:    Medicare IM give by:    Date Additional Medicare IM Given:    Additional Medicare Important Message give by:     If discussed at [place] of Stay Meetings, dates discussed:    Additional Comments: Pt discharged home today. No Cm needs noted. Pts son given list of PCP in area if pt desires to transfer care from [facility]. Christinia Gully [place], RN 07/14/2015, 2:03 PM

## 2015-07-17 LAB — CULTURE, BLOOD (ROUTINE X 2)
CULTURE: NO GROWTH
Culture: NO GROWTH

## 2015-11-29 ENCOUNTER — Encounter (HOSPITAL_COMMUNITY): Payer: Self-pay | Admitting: *Deleted

## 2015-11-29 ENCOUNTER — Inpatient Hospital Stay (HOSPITAL_COMMUNITY)
Admission: EM | Admit: 2015-11-29 | Discharge: 2015-12-04 | DRG: 482 | Disposition: A | Discharge status: Skilled Nursing Facility | Payer: Medicare Other | Attending: Internal Medicine | Admitting: Internal Medicine

## 2015-11-29 ENCOUNTER — Emergency Department (HOSPITAL_COMMUNITY): Payer: Medicare Other

## 2015-11-29 DIAGNOSIS — I272 Other secondary pulmonary hypertension: Secondary | ICD-10-CM | POA: Diagnosis present

## 2015-11-29 DIAGNOSIS — D696 Thrombocytopenia, unspecified: Secondary | ICD-10-CM | POA: Diagnosis present

## 2015-11-29 DIAGNOSIS — Y92012 Bathroom of single-family (private) house as the place of occurrence of the external cause: Secondary | ICD-10-CM

## 2015-11-29 DIAGNOSIS — Z87891 Personal history of nicotine dependence: Secondary | ICD-10-CM

## 2015-11-29 DIAGNOSIS — F0150 Vascular dementia without behavioral disturbance: Secondary | ICD-10-CM | POA: Diagnosis present

## 2015-11-29 DIAGNOSIS — Z7902 Long term (current) use of antithrombotics/antiplatelets: Secondary | ICD-10-CM | POA: Diagnosis not present

## 2015-11-29 DIAGNOSIS — E860 Dehydration: Secondary | ICD-10-CM | POA: Diagnosis present

## 2015-11-29 DIAGNOSIS — W19XXXA Unspecified fall, initial encounter: Secondary | ICD-10-CM

## 2015-11-29 DIAGNOSIS — I482 Chronic atrial fibrillation, unspecified: Secondary | ICD-10-CM | POA: Diagnosis present

## 2015-11-29 DIAGNOSIS — Z952 Presence of prosthetic heart valve: Secondary | ICD-10-CM

## 2015-11-29 DIAGNOSIS — Z8673 Personal history of transient ischemic attack (TIA), and cerebral infarction without residual deficits: Secondary | ICD-10-CM | POA: Diagnosis not present

## 2015-11-29 DIAGNOSIS — Z7901 Long term (current) use of anticoagulants: Secondary | ICD-10-CM

## 2015-11-29 DIAGNOSIS — Z01818 Encounter for other preprocedural examination: Secondary | ICD-10-CM | POA: Diagnosis not present

## 2015-11-29 DIAGNOSIS — W010XXA Fall on same level from slipping, tripping and stumbling without subsequent striking against object, initial encounter: Secondary | ICD-10-CM | POA: Diagnosis present

## 2015-11-29 DIAGNOSIS — I10 Essential (primary) hypertension: Secondary | ICD-10-CM | POA: Diagnosis present

## 2015-11-29 DIAGNOSIS — E039 Hypothyroidism, unspecified: Secondary | ICD-10-CM | POA: Diagnosis present

## 2015-11-29 DIAGNOSIS — S72142A Displaced intertrochanteric fracture of left femur, initial encounter for closed fracture: Principal | ICD-10-CM | POA: Diagnosis present

## 2015-11-29 DIAGNOSIS — S72143A Displaced intertrochanteric fracture of unspecified femur, initial encounter for closed fracture: Secondary | ICD-10-CM | POA: Diagnosis present

## 2015-11-29 DIAGNOSIS — Y92009 Unspecified place in unspecified non-institutional (private) residence as the place of occurrence of the external cause: Secondary | ICD-10-CM

## 2015-11-29 DIAGNOSIS — S72002A Fracture of unspecified part of neck of left femur, initial encounter for closed fracture: Secondary | ICD-10-CM

## 2015-11-29 DIAGNOSIS — I517 Cardiomegaly: Secondary | ICD-10-CM | POA: Diagnosis not present

## 2015-11-29 DIAGNOSIS — Z954 Presence of other heart-valve replacement: Secondary | ICD-10-CM | POA: Diagnosis not present

## 2015-11-29 DIAGNOSIS — R079 Chest pain, unspecified: Secondary | ICD-10-CM | POA: Diagnosis not present

## 2015-11-29 DIAGNOSIS — Y92099 Unspecified place in other non-institutional residence as the place of occurrence of the external cause: Secondary | ICD-10-CM

## 2015-11-29 DIAGNOSIS — F0280 Dementia in other diseases classified elsewhere without behavioral disturbance: Secondary | ICD-10-CM | POA: Diagnosis present

## 2015-11-29 DIAGNOSIS — M25552 Pain in left hip: Secondary | ICD-10-CM | POA: Diagnosis present

## 2015-11-29 HISTORY — DX: Urinary tract infection, site not specified: N39.0

## 2015-11-29 HISTORY — DX: Long term (current) use of anticoagulants: Z79.01

## 2015-11-29 HISTORY — DX: Permanent atrial fibrillation: I48.21

## 2015-11-29 HISTORY — DX: Cardiomegaly: I51.7

## 2015-11-29 HISTORY — DX: Pulmonary hypertension, unspecified: I27.20

## 2015-11-29 LAB — BASIC METABOLIC PANEL
ANION GAP: 8 (ref 5–15)
BUN: 24 mg/dL — ABNORMAL HIGH (ref 6–20)
CALCIUM: 10 mg/dL (ref 8.9–10.3)
CHLORIDE: 101 mmol/L (ref 101–111)
CO2: 29 mmol/L (ref 22–32)
CREATININE: 1.01 mg/dL — AB (ref 0.44–1.00)
GFR calc non Af Amer: 49 mL/min — ABNORMAL LOW (ref >60)
GFR, EST AFRICAN AMERICAN: 57 mL/min — AB (ref >60)
GLUCOSE: 92 mg/dL (ref 65–99)
Potassium: 3.7 mmol/L (ref 3.5–5.1)
Sodium: 138 mmol/L (ref 135–145)

## 2015-11-29 LAB — CBC WITH DIFFERENTIAL/PLATELET
BASOS PCT: 0 %
Basophils Absolute: 0 10*3/uL (ref 0.0–0.1)
Eosinophils Absolute: 0 10*3/uL (ref 0.0–0.7)
Eosinophils Relative: 1 %
HEMATOCRIT: 38.6 % (ref 36.0–46.0)
HEMOGLOBIN: 12.5 g/dL (ref 12.0–15.0)
LYMPHS ABS: 0.6 10*3/uL — AB (ref 0.7–4.0)
Lymphocytes Relative: 8 %
MCH: 27.7 pg (ref 26.0–34.0)
MCHC: 32.4 g/dL (ref 30.0–36.0)
MCV: 85.6 fL (ref 78.0–100.0)
MONO ABS: 0.5 10*3/uL (ref 0.1–1.0)
MONOS PCT: 6 %
NEUTROS ABS: 6.6 10*3/uL (ref 1.7–7.7)
NEUTROS PCT: 85 %
Platelets: 168 10*3/uL (ref 150–400)
RBC: 4.51 MIL/uL (ref 3.87–5.11)
RDW: 14.8 % (ref 11.5–15.5)
WBC: 7.8 10*3/uL (ref 4.0–10.5)

## 2015-11-29 LAB — PROTIME-INR
INR: 2.3 — ABNORMAL HIGH (ref 0.00–1.49)
Prothrombin Time: 25 seconds — ABNORMAL HIGH (ref 11.6–15.2)

## 2015-11-29 LAB — ABO/RH: ABO/RH(D): A POS

## 2015-11-29 MED ORDER — LISINOPRIL 5 MG PO TABS
2.5000 mg | ORAL_TABLET | Freq: Every day | ORAL | Status: DC
  Administered 2015-11-30 – 2015-12-04 (×5): 2.5 mg via ORAL
  Filled 2015-11-29 (×5): qty 1

## 2015-11-29 MED ORDER — POTASSIUM CHLORIDE IN NACL 20-0.9 MEQ/L-% IV SOLN
INTRAVENOUS | Status: DC
  Administered 2015-11-29: 1000 mL via INTRAVENOUS
  Administered 2015-11-30: 04:00:00 via INTRAVENOUS

## 2015-11-29 MED ORDER — AMLODIPINE BESYLATE 5 MG PO TABS
2.5000 mg | ORAL_TABLET | Freq: Every day | ORAL | Status: DC
  Administered 2015-11-30 – 2015-12-04 (×5): 2.5 mg via ORAL
  Filled 2015-11-29 (×5): qty 1

## 2015-11-29 MED ORDER — SODIUM CHLORIDE 0.9 % IV SOLN
INTRAVENOUS | Status: DC
  Administered 2015-11-29: 09:00:00 via INTRAVENOUS

## 2015-11-29 MED ORDER — ATENOLOL 25 MG PO TABS
12.5000 mg | ORAL_TABLET | Freq: Every day | ORAL | Status: DC
  Administered 2015-11-30 – 2015-12-04 (×5): 12.5 mg via ORAL
  Filled 2015-11-29 (×5): qty 1

## 2015-11-29 MED ORDER — ACETAMINOPHEN 325 MG PO TABS: 650.0000 mg | ORAL_TABLET | Freq: Four times a day (QID) | ORAL | Status: DC | PRN

## 2015-11-29 MED ORDER — ACETAMINOPHEN 650 MG RE SUPP: 650.0000 mg | Freq: Four times a day (QID) | RECTAL | Status: DC | PRN

## 2015-11-29 MED ORDER — HEPARIN (PORCINE) IN NACL 100-0.45 UNIT/ML-% IJ SOLN
850.0000 [IU]/h | INTRAMUSCULAR | Status: DC
  Administered 2015-11-29: 850 [IU]/h via INTRAVENOUS
  Filled 2015-11-29: qty 250

## 2015-11-29 MED ORDER — FENTANYL CITRATE (PF) 100 MCG/2ML IJ SOLN
50.0000 ug | INTRAMUSCULAR | Status: AC | PRN
  Administered 2015-11-29 (×2): 50 ug via INTRAVENOUS
  Filled 2015-11-29 (×2): qty 2

## 2015-11-29 MED ORDER — ALBUTEROL SULFATE (2.5 MG/3ML) 0.083% IN NEBU: 2.5000 mg | INHALATION_SOLUTION | RESPIRATORY_TRACT | Status: DC | PRN

## 2015-11-29 MED ORDER — HYDROCODONE-ACETAMINOPHEN 5-325 MG PO TABS
1.0000 | ORAL_TABLET | ORAL | Status: DC | PRN
  Administered 2015-11-29: 1 via ORAL
  Administered 2015-11-30: 2 via ORAL
  Administered 2015-11-30: 1 via ORAL
  Filled 2015-11-29 (×2): qty 1
  Filled 2015-11-29: qty 2
  Filled 2015-11-29: qty 1

## 2015-11-29 MED ORDER — VITAMIN C 500 MG PO TABS
500.0000 mg | ORAL_TABLET | Freq: Two times a day (BID) | ORAL | Status: DC
  Administered 2015-11-29 – 2015-12-04 (×10): 500 mg via ORAL
  Filled 2015-11-29 (×10): qty 1

## 2015-11-29 MED ORDER — LEVOTHYROXINE SODIUM 25 MCG PO TABS
125.0000 ug | ORAL_TABLET | Freq: Every day | ORAL | Status: DC
  Administered 2015-11-30 – 2015-12-04 (×5): 125 ug via ORAL
  Filled 2015-11-29 (×5): qty 1

## 2015-11-29 MED ORDER — ONDANSETRON HCL 4 MG/2ML IJ SOLN: 4.0000 mg | Freq: Three times a day (TID) | INTRAMUSCULAR | Status: DC | PRN

## 2015-11-29 MED ORDER — MORPHINE SULFATE (PF) 2 MG/ML IV SOLN: 2.0000 mg | INTRAVENOUS | Status: DC | PRN

## 2015-11-29 MED ORDER — DOCUSATE SODIUM 100 MG PO CAPS
100.0000 mg | ORAL_CAPSULE | Freq: Two times a day (BID) | ORAL | Status: DC
  Administered 2015-11-29 – 2015-12-04 (×11): 100 mg via ORAL
  Filled 2015-11-29 (×11): qty 1

## 2015-11-29 MED ORDER — SODIUM CHLORIDE 0.9 % IV SOLN: Freq: Once | INTRAVENOUS | Status: AC

## 2015-11-29 MED ORDER — MORPHINE SULFATE (PF) 2 MG/ML IV SOLN
2.0000 mg | INTRAVENOUS | Status: DC | PRN
  Administered 2015-11-29 – 2015-12-03 (×5): 2 mg via INTRAVENOUS
  Filled 2015-11-29 (×5): qty 1

## 2015-11-29 NOTE — ED Notes (Signed)
Pt repositioned. Pt reports some relief of left hip pain. nad noted.

## 2015-11-29 NOTE — Progress Notes (Signed)
Do not give information to [place] family or Ruthe Mannan have them call family

## 2015-11-29 NOTE — Consult Note (Addendum)
HOSPITAL CONSULT [phone] (HIP FRACTURE )  MDM= MODERATE COMPLEXITY COMP HISTORY COMP EXAM  Patient ID: Courtney [name], female   DOB: [birth year], 82 y.o.   MRN: [id number]  New patient   Chief Complaint  Patient presents with  . Fall     Courtney [name] is a 82 y.o. female.   HPI-82 years old very active mild dementia works out 3 days a week at a gym fell going to the bathroom Complains  pain Location left hip Duration today April 26 date of injury Severity severe if moved Quality sharp and dull Modified by movement  Review of Systems (all) Review of Systems  Constitutional: Negative for fever.  HENT: Negative for ear pain.   Eyes: Negative for pain.  Respiratory: Negative for cough and stridor.   Cardiovascular: Negative for chest pain.  Gastrointestinal: Negative for heartburn.  Genitourinary: Negative for dysuria.  Musculoskeletal: Negative for myalgias.  Skin: Negative for rash.  Neurological: Negative for tingling and headaches.  Endo/Heme/Allergies: Negative for polydipsia. Bruises/bleeds easily.  Psychiatric/Behavioral: Negative for depression.    Past Medical History  Diagnosis Date  . Stroke ([place])   . Hypertension   . Vascular dementia   . Hypothyroidism   . History of mitral valve replacement     Past Surgical History  Procedure Laterality Date  . Mitral valve replacement    . Abdominal hysterectomy      History reviewed. No pertinent family history. the family history was reviewed and there was no history of osteoporotic fracture or family history of anesthesia risk factors   Social History Social History  Substance Use Topics  . Smoking status: Never Smoker   . Smokeless tobacco: None  . Alcohol Use: No    Allergies  Allergen Reactions  . Penicillins Swelling    Has patient had a PCN reaction causing immediate rash, facial/tongue/throat swelling, SOB or lightheadedness with hypotension: Yes Has patient had a PCN reaction causing severe rash involving  mucus membranes or skin necrosis: No Has patient had a PCN reaction that required hospitalization No Has patient had a PCN reaction occurring within the last 10 years: No If all of the above answers are "NO", then may proceed with Cephalosporin use. Whole body "swelled up" and had to go to the hospital    Current Facility-Administered Medications  Medication Dose Route Frequency Provider Last Rate Last Dose  . 0.9 % NaCl with KCl 20 mEq/ L  infusion   Intravenous Continuous Rexene Alberts, MD      . acetaminophen (TYLENOL) tablet 650 mg  650 mg Oral Q6H PRN Rexene Alberts, MD       Or  . acetaminophen (TYLENOL) suppository 650 mg  650 mg Rectal Q6H PRN Rexene Alberts, MD      . albuterol (PROVENTIL) (2.5 MG/3ML) 0.083% nebulizer solution 2.5 mg  2.5 mg Nebulization Q2H PRN Rexene Alberts, MD      . Derrill Memo ON 11/30/2015] amLODipine (NORVASC) tablet 2.5 mg  2.5 mg Oral Daily Rexene Alberts, MD      . Derrill Memo ON 11/30/2015] atenolol (TENORMIN) tablet 12.5 mg  12.5 mg Oral Daily Rexene Alberts, MD      . docusate sodium (COLACE) capsule 100 mg  100 mg Oral BID Rexene Alberts, MD      . HYDROcodone-acetaminophen (NORCO/VICODIN) 5-325 MG per tablet 1-2 tablet  1-2 tablet Oral Q4H PRN Rexene Alberts, MD      . Derrill Memo ON 11/30/2015] levothyroxine (SYNTHROID, LEVOTHROID) tablet 125 mcg  125 mcg Oral QAC breakfast  Rexene Alberts, MD      . Derrill Memo ON 11/30/2015] lisinopril (PRINIVIL,ZESTRIL) tablet 2.5 mg  2.5 mg Oral Daily Rexene Alberts, MD      . morphine 2 MG/ML injection 2 mg  2 mg Intravenous Q2H PRN Rexene Alberts, MD      . vitamin C (ASCORBIC ACID) tablet 500 mg  500 mg Oral BID Rexene Alberts, MD         Physical Exam(=30) Blood pressure 110/78, pulse 60, temperature 97.9 F (36.6 C), temperature source Oral, resp. rate 16, height 5\' 6"  (1.676 m), weight 132 lb (59.875 kg), SpO2 93 %. Gen. Appearance  Appearance appears younger than stated age Peripheral vascular system no peripheral edema but skin  changes consistent with venous stasis Lymph nodes negative axilla and groin Gait nonambulatory secondary to fracture  Upper extremities  Inspection revealed no malalignment or asymmetry  Assessment of range of motion: Full range of motion was recorded  Assessment of stability: Elbow wrist and hand and shoulder were stable  Assessment of muscle strength and tone revealed grade 5 muscle strength and normal muscle tone  Skin was normal without rash lesion or ulceration  Lower extremities-right  Inspection revealed no malalignment or asymmetry  Assessment of range of motion: Full range of motion was recorded  Assessment of stability: Elbow wrist and hand and shoulder were stable  Assessment of muscle strength and tone revealed grade 5 muscle strength and normal muscle tone  Skin discoloration around the ankle and leg from venous stasis most likely  Lower extremity left Inspection tenderness left greater trochanteric region left proximal thigh and the leg is in external rotation She can move her foot we did move her knee or hip because of the fracture Ankle is stable knee is stable hip not tested because of fracture Muscle tone normal dorsiflexion of the foot strength normal plantar flexion normal Skin normal except for discoloration as described  Coordination was tested by finger-to-nose nose and was normal Deep tendon reflexes were 2+ in the upper extremities 2+ on the right lower extremity not tested on the left Examination of sensation by touch was normal  Mental status  Oriented to time person and place normal despite reported dementia  Mood and affect normal without depression anxiety or agitation  Dx:   Data Reviewed  I reviewed the images and the reports and my independent interpretation is two-part intertrochanteric fracture intact posterior medial buttress left hip fracture   Assessment  Left hip intratrochanteric fracture stable  Plan   Left hip surgery internal  fixation with gamma nail  Cardiology consult requested Stop Plavix she did get some today Stop Coumadin  This procedure has been fully reviewed with the patient and written informed consent has been obtained.    Carole Civil MD

## 2015-11-29 NOTE — ED Notes (Signed)
EDP at bedside  

## 2015-11-29 NOTE — Progress Notes (Addendum)
ANTICOAGULATION CONSULT NOTE - Initial Consult  Pharmacy Consult for heparin Indication: MVR  Allergies  Allergen Reactions  . Penicillins Swelling    Has patient had a PCN reaction causing immediate rash, facial/tongue/throat swelling, SOB or lightheadedness with hypotension: Yes Has patient had a PCN reaction causing severe rash involving mucus membranes or skin necrosis: No Has patient had a PCN reaction that required hospitalization No Has patient had a PCN reaction occurring within the last 10 years: No If all of the above answers are "NO", then may proceed with Cephalosporin use. Whole body "swelled up" and had to go to the hospital    Patient Measurements: Height: 5\' 6"  (167.6 cm) Weight: 132 lb (59.875 kg) IBW/kg (Calculated) : 59.3 Heparin Dosing Weight: 59.9 kg  Vital Signs: Temp: 97.9 F (36.6 C) (04/26 1133) Temp Source: Oral (04/26 1133) BP: 110/78 mmHg (04/26 1131) Pulse Rate: 60 (04/26 1131)  Labs:  Recent Labs  11/29/15 0909  HGB 12.5  HCT 38.6  PLT 168  LABPROT 25.0*  INR 2.30*  CREATININE 1.01*    Estimated Creatinine Clearance: 38.1 mL/min (by C-G formula based on Cr of 1.01).   Medical History: Past Medical History  Diagnosis Date  . Stroke ([place])   . Hypertension   . Vascular dementia   . Hypothyroidism   . History of mitral valve replacement   . UTI (lower urinary tract infection)     Medications:  Prescriptions prior to admission  Medication Sig Dispense Refill Last Dose  . amLODipine (NORVASC) 2.5 MG tablet Take 2.5 mg by mouth daily.   11/29/2015 at Unknown time  . atenolol (TENORMIN) 25 MG tablet Take 12.5 mg by mouth.   11/29/2015 at Unknown time  . b complex vitamins tablet Take 1 tablet by mouth daily.   11/29/2015 at Unknown time  . clopidogrel (PLAVIX) 75 MG tablet Take 75 mg by mouth daily.   11/29/2015 at Unknown time  . levothyroxine (SYNTHROID, LEVOTHROID) 125 MCG tablet Take 125 mcg by mouth daily before breakfast.    11/29/2015 at Unknown time  . lisinopril (PRINIVIL,ZESTRIL) 2.5 MG tablet Take 2.5 mg by mouth daily.   11/29/2015 at Unknown time  . triamterene-hydrochlorothiazide (DYAZIDE) 37.5-25 MG capsule Take 1 capsule by mouth daily.   11/29/2015 at Unknown time  . vitamin C (ASCORBIC ACID) 500 MG tablet Take 500 mg by mouth 2 (two) times daily.   11/29/2015 at Unknown time  . warfarin (COUMADIN) 2 MG tablet Take 2 mg by mouth daily. Pt takes 2mg  one day and 4mg  the next   11/28/2015 at 1700  . acetaminophen (TYLENOL) 500 MG tablet Take 500 mg by mouth every 6 (six) hours as needed for mild pain.   unknown  . ciprofloxacin (CIPRO) 500 MG tablet Take 1 tablet (500 mg total) by mouth 2 (two) times daily. Antibiotic be taken for 3 more days, starting tomorrow. (Patient not taking: Reported on 11/29/2015) 6 tablet 0   . docusate sodium (COLACE) 100 MG capsule Take 100 mg by mouth 2 (two) times daily.   unknown  . ibandronate (BONIVA) 150 MG tablet Take 150 mg by mouth every 30 (thirty) days.  0 2 weeks    Assessment: 82 yo lady on coumadin PTA for MVR.  She broke her hip and coumadin will be held in anticipation for surgery.  Her INR is slightly subtherapeutic at 2.30.   Goal of Therapy:  Heparin level 0.3-0.7 units/ml Monitor platelets by anticoagulation protocol: Yes   Plan:  Start  heparin drip at 850 units/hr with no bolus Check heparin level in ~ 8 hours after start Daily HL and CBC while on heparin Monitor for bleeding complications  Yailene Badia Poteet 11/29/2015,4:39 PM  Initial HL is 0.17 units/hr.  Will increase drip to 1000 units/hr.  Check HL 6 hours after rate change

## 2015-11-29 NOTE — ED Provider Notes (Signed)
CSN: [id number]     Arrival date & time 11/29/15  0818 History  By signing my name below, I, Terressa Koyanagi, attest that this documentation has been prepared under the direction and in the presence of Elnora Morrison, MD. Electronically Signed: Terressa Koyanagi, ED Scribe. 11/29/2015. 8:33 AM.  Chief Complaint  Patient presents with  . Fall   The history is provided by the patient. No language interpreter was used.   PCP: No primary care provider on file. HPI Comments: Courtney [name] is a 82 y.o. female, with PMHx noted below including HTN, stroke, and vascular dementia, who presents to the Emergency Department, via EMS, complaining of a mechanical fall sustained this morning after pt slipped and fell on her left side. Associated Sx include severe left hip pain. Pt denies head trauma or LOC resulting from the fall. Pt denies chest pain, SOB, abd pain, or any other pain at this time. Pt further denies weakness or any urinary Sx.   Past Medical History  Diagnosis Date  . Stroke ([place])   . Hypertension   . Vascular dementia   . Hypothyroidism   . History of mitral valve replacement    Past Surgical History  Procedure Laterality Date  . Mitral valve replacement    . Abdominal hysterectomy     No family history on file. Social History  Substance Use Topics  . Smoking status: Never Smoker   . Smokeless tobacco: None  . Alcohol Use: No   OB History    No data available     Review of Systems  Constitutional: Negative for fever.  Respiratory: Negative for shortness of breath.   Cardiovascular: Negative for chest pain.  Gastrointestinal: Negative for abdominal pain.  Genitourinary: Negative for dysuria, urgency, frequency, decreased urine volume and difficulty urinating.  Musculoskeletal: Positive for arthralgias (left hip).  Neurological: Negative for weakness.  All other systems reviewed and are negative.  Allergies  Penicillins  Home Medications   Prior to Admission medications    Medication Sig Start Date End Date Taking? Authorizing Provider  amLODipine (NORVASC) 2.5 MG tablet Take 2.5 mg by mouth daily.   Yes Historical Provider, MD  atenolol (TENORMIN) 25 MG tablet Take 12.5 mg by mouth.   Yes Historical Provider, MD  b complex vitamins tablet Take 1 tablet by mouth daily.   Yes Historical Provider, MD  clopidogrel (PLAVIX) 75 MG tablet Take 75 mg by mouth daily.   Yes Historical Provider, MD  levothyroxine (SYNTHROID, LEVOTHROID) 125 MCG tablet Take 125 mcg by mouth daily before breakfast.   Yes Historical Provider, MD  lisinopril (PRINIVIL,ZESTRIL) 2.5 MG tablet Take 2.5 mg by mouth daily.   Yes Historical Provider, MD  triamterene-hydrochlorothiazide (DYAZIDE) 37.5-25 MG capsule Take 1 capsule by mouth daily.   Yes Historical Provider, MD  vitamin C (ASCORBIC ACID) 500 MG tablet Take 500 mg by mouth 2 (two) times daily.   Yes Historical Provider, MD  warfarin (COUMADIN) 2 MG tablet Take 2 mg by mouth daily. Pt takes 2mg  one day and 4mg  the next   Yes Historical Provider, MD  acetaminophen (TYLENOL) 500 MG tablet Take 500 mg by mouth every 6 (six) hours as needed for mild pain.    Historical Provider, MD  ciprofloxacin (CIPRO) 500 MG tablet Take 1 tablet (500 mg total) by mouth 2 (two) times daily. Antibiotic be taken for 3 more days, starting tomorrow. Patient not taking: Reported on 11/29/2015 07/14/15   Rexene Alberts, MD  docusate sodium (COLACE) 100 MG  capsule Take 100 mg by mouth 2 (two) times daily.    Historical Provider, MD  ibandronate (BONIVA) 150 MG tablet Take 150 mg by mouth every 30 (thirty) days. 10/17/15   Historical Provider, MD   Triage Vitals: BP 144/69 mmHg  Pulse 62  Temp(Src) 98.1 F (36.7 C) (Oral)  Resp 16  Ht 5\' 6"  (1.676 m)  Wt 132 lb (59.875 kg)  BMI 21.32 kg/m2  SpO2 98% Physical Exam  Constitutional: She is oriented to person, place, and time. She appears well-developed and well-nourished. No distress.  HENT:  Head: Normocephalic  and atraumatic.  Mouth/Throat: Mucous membranes are dry.  Eyes: EOM are normal. Pupils are equal, round, and reactive to light.  Eyes track normal   Neck: Normal range of motion.  Cardiovascular: Normal rate, regular rhythm and normal heart sounds.   Pulmonary/Chest: Effort normal and breath sounds normal.  Abdominal: Soft. She exhibits no distension. There is no tenderness.  Musculoskeletal: She exhibits tenderness.       Left hip: She exhibits tenderness (lateral left hip).       Left knee: No tenderness found.  2+ pulse distally in left leg. No other tenderness in left leg. No pain with ROM of right hip. Tenderness lateral hip and any rom of left hip  Neurological: She is alert and oriented to person, place, and time.  Skin: Skin is warm and dry.  Psychiatric:  Mild dementia, pleasant, answers questions without difficulty, alert to place and person. Does not know her medical hx  Nursing note and vitals reviewed.   ED Course  Procedures (including critical care time) DIAGNOSTIC STUDIES: Oxygen Saturation is 98% on ra, nl by my interpretation.    COORDINATION OF CARE: 8:30 AM: Discussed treatment plan which includes imaging with pt at bedside; patient verbalizes understanding and agrees with treatment plan.  Labs Review Labs Reviewed  BASIC METABOLIC PANEL - Abnormal; Notable for the following:    BUN 24 (*)    Creatinine, Ser 1.01 (*)    GFR calc non Af Amer 49 (*)    GFR calc Af Amer 57 (*)    All other components within normal limits  CBC WITH DIFFERENTIAL/PLATELET - Abnormal; Notable for the following:    Lymphs Abs 0.6 (*)    All other components within normal limits  PROTIME-INR - Abnormal; Notable for the following:    Prothrombin Time 25.0 (*)    INR 2.30 (*)    All other components within normal limits    Imaging Review Dg Chest 1 View  11/29/2015  CLINICAL DATA:  Fall, left hip pain EXAM: CHEST 1 VIEW COMPARISON:  07/12/2015 FINDINGS: Cardiomegaly evident  with the prior mitral valve replacement. Stable vascular congestion and hyperinflation. No focal pneumonia, collapse or consolidation. Negative for edema, effusion or pneumothorax. Trachea midline. Aortic atherosclerosis evident. Bones are osteopenic. Degenerative changes noted of the spine. IMPRESSION: Stable cardiomegaly and postoperative findings. No interval change or superimposed acute process. Electronically Signed   By: Jerilynn Mages.  Shick M.D.   On: 11/29/2015 09:20   Dg Hip Unilat With Pelvis 2-3 Views Left  11/29/2015  CLINICAL DATA:  Status post fall in the bathroom this morning now complaining of pain in the upper left thigh EXAM: DG HIP (WITH OR WITHOUT PELVIS) 2-3V LEFT COMPARISON:  None in PACs FINDINGS: The bones are osteopenic. There is an acute mildly angulated fracture of the intertrochanteric region of the left hip. The left acetabulum and pubic rami appear intact as does the  iliac crest. The right hemipelvis and right hip are unremarkable. IMPRESSION: There is an acute slightly angulated intertrochanteric fracture of the left hip. No pelvic fracture is observed. Electronically Signed   By: David  Martinique M.D.   On: 11/29/2015 09:18   I have personally reviewed and evaluated these images as part of my medical decision-making.   EKG Interpretation None      MDM   Final diagnoses:  H/O mitral valve replacement  Fall, initial encounter  Hip fracture, left, closed, initial encounter [facility])   Patient presents after reported mechanical fall, patient recalls details however does not know her own medical history well. On review of medical history patient does have vascular dementia. Patient is on Coumadin for which she was not aware. Plan for screening blood work, x-ray left hip, pain meds and reassessment. Concern clinically for hip fracture.  X-ray reviewed revealing hip fracture, discussed with orthopedics. Patient is on Plavix and Coumadin. Plan to hold Plavix and reverse Coumadin based on  timing of surgery. Discussed this with hospitalist.  The patients results and plan were reviewed and discussed.   Any x-rays performed were independently reviewed by myself.   Differential diagnosis were considered with the presenting HPI.  Medications  fentaNYL (SUBLIMAZE) injection 50 mcg (50 mcg Intravenous Given 11/29/15 0912)  0.9 %  sodium chloride infusion ( Intravenous New Bag/Given 11/29/15 0912)  ondansetron (ZOFRAN) injection 4 mg (not administered)  morphine 2 MG/ML injection 2 mg (not administered)    Filed Vitals:   11/29/15 1003 11/29/15 1014 11/29/15 1015 11/29/15 1030  BP:  108/97  101/54  Pulse:  62 62 62  Temp: 97.9 F (36.6 C)     TempSrc: Oral     Resp:      Height:      Weight:      SpO2:  93% 91%     Final diagnoses:  H/O mitral valve replacement  Fall, initial encounter  Hip fracture, left, closed, initial encounter [facility])    Admission/ observation were discussed with the admitting physician, patient and/or family and they are comfortable with the plan.    Elnora Morrison, MD 11/29/15 [phone]

## 2015-11-29 NOTE — ED Notes (Signed)
Pt comes in by EMS from home. Pt fell in the bathroom this morning. Pt is having pain in her upper left thigh pain. Per EMS, pt tripped on a rug at her sink. Pt denies hitting her head or any loss of consciousness.   VS stable, CBG 160. IV established

## 2015-11-29 NOTE — H&P (Signed)
History and Physical    Courtney [name] [id number] DOB: [birth year] DOA: 11/29/2015  Referring MD/NP/PA: Dr. Reather Converse PCP: M.D. in [facility]  Outpatient Specialists: None specifically Patient coming from: Home  Chief Complaint: Fall and left hip pain  HPI: Courtney [name] is a 82 y.o. female with medical history significant of mitral valve replacement on Coumadin, vascular dementia, previous stroke with no significant residuals other than dementia, hypothyroidism, and hypertension. She presents today with a complaint of left hip pain after falling early this morning. She does not quite remember what happened, so the history is being provided by her son, Karene Schnittker. Accordingly, the patient was in her usual state of health. She can up early this morning to go to the bathroom. Apparently, she slipped and fell. [name] found her on the floor in the bathroom. She was not incontinent. There was no loss of consciousness. There was no apparent head trauma. However, she did complain of left hip pain. She denies any recent fever, chills, chest pain, shortness of breath, dizziness, abdominal pain, nausea/vomiting/diarrhea, or pain with urination.  ED Course: In the ED, she was afebrile and hemodynamically stable. Her lab data were significant for a BUN of 24, creatinine of 1.01, and INR of 2.3. Pelvic and hip x-ray revealed an acute slightly angulated intratrochanteric fracture of the left hip with no pelvic fracture observed. Chest x-ray revealed stable cardiomegaly and postoperative findings. She is being admitted for further evaluation and management.  Review of Systems: As per HPI otherwise 10 point review of systems negative.    Past Medical History  Diagnosis Date  . Stroke ([place])   . Hypertension   . Vascular dementia   . Hypothyroidism   . History of mitral valve replacement     Past Surgical History  Procedure Laterality Date  . Mitral valve replacement    . Abdominal hysterectomy      Social  history: She is divorced, but her divorced husband lives with her. Her son [name] also lives with them. He is their primary caretaker. She has a remote smoking history, but stopped decades ago. She does not have any smokeless tobacco history on file. She reports that she does not drink alcohol or use illicit drugs.  Allergies  Allergen Reactions  . Penicillins Swelling    Has patient had a PCN reaction causing immediate rash, facial/tongue/throat swelling, SOB or lightheadedness with hypotension: Yes Has patient had a PCN reaction causing severe rash involving mucus membranes or skin necrosis: No Has patient had a PCN reaction that required hospitalization No Has patient had a PCN reaction occurring within the last 10 years: No If all of the above answers are "NO", then may proceed with Cephalosporin use. Whole body "swelled up" and had to go to the hospital    Family history. Family history positive for osteoarthritis and heart disease.  Prior to Admission medications   Medication Sig Start Date End Date Taking? Authorizing Provider  amLODipine (NORVASC) 2.5 MG tablet Take 2.5 mg by mouth daily.   Yes Historical Provider, MD  atenolol (TENORMIN) 25 MG tablet Take 12.5 mg by mouth.   Yes Historical Provider, MD  b complex vitamins tablet Take 1 tablet by mouth daily.   Yes Historical Provider, MD  clopidogrel (PLAVIX) 75 MG tablet Take 75 mg by mouth daily.   Yes Historical Provider, MD  levothyroxine (SYNTHROID, LEVOTHROID) 125 MCG tablet Take 125 mcg by mouth daily before breakfast.   Yes Historical Provider, MD  lisinopril (PRINIVIL,ZESTRIL) 2.5 MG  tablet Take 2.5 mg by mouth daily.   Yes Historical Provider, MD  triamterene-hydrochlorothiazide (DYAZIDE) 37.5-25 MG capsule Take 1 capsule by mouth daily.   Yes Historical Provider, MD  vitamin C (ASCORBIC ACID) 500 MG tablet Take 500 mg by mouth 2 (two) times daily.   Yes Historical Provider, MD  warfarin (COUMADIN) 2 MG tablet Take 2 mg by  mouth daily. Pt takes 2mg  one day and 4mg  the next   Yes Historical Provider, MD  acetaminophen (TYLENOL) 500 MG tablet Take 500 mg by mouth every 6 (six) hours as needed for mild pain.    Historical Provider, MD  ciprofloxacin (CIPRO) 500 MG tablet Take 1 tablet (500 mg total) by mouth 2 (two) times daily. Antibiotic be taken for 3 more days, starting tomorrow. Patient not taking: Reported on 11/29/2015 07/14/15   Rexene Alberts, MD  docusate sodium (COLACE) 100 MG capsule Take 100 mg by mouth 2 (two) times daily.    Historical Provider, MD  ibandronate (BONIVA) 150 MG tablet Take 150 mg by mouth every 30 (thirty) days. 10/17/15   Historical Provider, MD    Physical Exam: Filed Vitals:   11/29/15 1015 11/29/15 1030 11/29/15 1131 11/29/15 1133  BP:  101/54 110/78   Pulse: 62 62 60   Temp:    97.9 F (36.6 C)  TempSrc:    Oral  Resp:      Height:      Weight:      SpO2: 91%  93%       Constitutional: NAD, calm, comfortable Filed Vitals:   11/29/15 1015 11/29/15 1030 11/29/15 1131 11/29/15 1133  BP:  101/54 110/78   Pulse: 62 62 60   Temp:    97.9 F (36.6 C)  TempSrc:    Oral  Resp:      Height:      Weight:      SpO2: 91%  93%    General: Pleasant 82 year old Caucasian woman in no acute distress.  Eyes: PERRL, lids and conjunctivae normal ENMT: Mucous membranes are moist. Posterior pharynx clear of any exudate or lesions.Normal dentition.  Neck: normal, supple, no masses, no thyromegaly Respiratory: clear to auscultation bilaterally, no wheezing, no crackles. Normal respiratory effort. No accessory muscle use.  Cardiovascular: S1, S2, with an S2 click. No extremity edema. 2+ pedal pulses. Abdomen: no tenderness, no masses palpated. No hepatosplenomegaly. Bowel sounds positive.  Musculoskeletal: left lower extremity with moderate pain and mild edema over the proximal thigh/hip area with some shortening and external rotation.  Good ROM, no contractures on the right upper and  right lower extremity and left upper extremity . Normal muscle tone.  Skin: no rashes, lesions, ulcers. No induration Neurologic: CN 2-12 grossly intact. Sensation intact, DTR normal. Strength 5/5 in all 4.  Psychiatric:  Alert and oriented x 2. Normal mood.  Neurologic: Cranial nerves II through XII are grossly intact.   Labs on Admission: I have personally reviewed following labs and imaging studies  CBC:  Recent Labs Lab 11/29/15 0909  WBC 7.8  NEUTROABS 6.6  HGB 12.5  HCT 38.6  MCV 85.6  PLT [id number]   Basic Metabolic Panel:  Recent Labs Lab 11/29/15 0909  NA 138  K 3.7  CL 101  CO2 29  GLUCOSE 92  BUN 24*  CREATININE 1.01*  CALCIUM 10.0   GFR: Estimated Creatinine Clearance: 38.1 mL/min (by C-G formula based on Cr of 1.01). Liver Function Tests: No results for input(s): AST, ALT, ALKPHOS, BILITOT, PROT,  ALBUMIN in the last 168 hours. No results for input(s): LIPASE, AMYLASE in the last 168 hours. No results for input(s): AMMONIA in the last 168 hours. Coagulation Profile:  Recent Labs Lab 11/29/15 0909  INR 2.30*   Cardiac Enzymes: No results for input(s): CKTOTAL, CKMB, CKMBINDEX, TROPONINI in the last 168 hours. BNP (last 3 results) No results for input(s): PROBNP in the last 8760 hours. HbA1C: No results for input(s): HGBA1C in the last 72 hours. CBG: No results for input(s): GLUCAP in the last 168 hours. Lipid Profile: No results for input(s): CHOL, HDL, LDLCALC, TRIG, CHOLHDL, LDLDIRECT in the last 72 hours. Thyroid Function Tests: No results for input(s): TSH, T4TOTAL, FREET4, T3FREE, THYROIDAB in the last 72 hours. Anemia Panel: No results for input(s): VITAMINB12, FOLATE, FERRITIN, TIBC, IRON, RETICCTPCT in the last 72 hours. Urine analysis:    Component Value Date/Time   COLORURINE YELLOW 07/12/2015 1345   APPEARANCEUR HAZY* 07/12/2015 1345   LABSPEC 1.020 07/12/2015 1345   PHURINE 6.0 07/12/2015 1345   GLUCOSEU NEGATIVE 07/12/2015 1345    HGBUR LARGE* 07/12/2015 1345   BILIRUBINUR NEGATIVE 07/12/2015 1345   KETONESUR TRACE* 07/12/2015 1345   PROTEINUR 100* 07/12/2015 1345   NITRITE POSITIVE* 07/12/2015 1345   LEUKOCYTESUR MODERATE* 07/12/2015 1345   Sepsis Labs: @LABRCNTIP (procalcitonin:4,lacticidven:4) )No results found for this or any previous visit (from the past 240 hour(s)).   Radiological Exams on Admission: Dg Chest 1 View  11/29/2015  CLINICAL DATA:  Fall, left hip pain EXAM: CHEST 1 VIEW COMPARISON:  07/12/2015 FINDINGS: Cardiomegaly evident with the prior mitral valve replacement. Stable vascular congestion and hyperinflation. No focal pneumonia, collapse or consolidation. Negative for edema, effusion or pneumothorax. Trachea midline. Aortic atherosclerosis evident. Bones are osteopenic. Degenerative changes noted of the spine. IMPRESSION: Stable cardiomegaly and postoperative findings. No interval change or superimposed acute process. Electronically Signed   By: Jerilynn Mages.  Shick M.D.   On: 11/29/2015 09:20   Dg Hip Unilat With Pelvis 2-3 Views Left  11/29/2015  CLINICAL DATA:  Status post fall in the bathroom this morning now complaining of pain in the upper left thigh EXAM: DG HIP (WITH OR WITHOUT PELVIS) 2-3V LEFT COMPARISON:  None in PACs FINDINGS: The bones are osteopenic. There is an acute mildly angulated fracture of the intertrochanteric region of the left hip. The left acetabulum and pubic rami appear intact as does the iliac crest. The right hemipelvis and right hip are unremarkable. IMPRESSION: There is an acute slightly angulated intertrochanteric fracture of the left hip. No pelvic fracture is observed. Electronically Signed   By: David  Martinique M.D.   On: 11/29/2015 09:18    EKG: Independently reviewed. Pending   Assessment/Plan Principal Problem:   Intertrochanteric fracture of left hip [facility]) Active Problems:   Chronic anticoagulation   H/O mitral valve replacement   Fall at home   Dehydration   Vascular  dementia   Essential hypertension   1. Fall at home resulting in acute left (hip) intertrochanteric fracture. No evidence of syncope or head trauma. 2. Chronic anticoagulation with Coumadin secondary to history of mitral valve replacement. Patient's INR is 2.30 on admission.  3. Vascular dementia. Currently stable without behavioral disturbance. Patient is also treated with Plavix chronically. 4. Hypertension. Patient is treated chronically with amlodipine, lisinopril, Dyazide, and atenolol. Currently stable, but blood pressure on the lower side of normal given the number of hypertensive medications she is on. 3. Mild dehydration. Patient is BUN is 25 and her creatinine is 1.01, likely from  mild dehydration from the fall and Dyazide.    Plan: 1. Orthopedic surgeon, Dr. Aline Brochure was consulted. He plans left hip surgery, but requests cardiology consult regarding anticoagulation bridging or not. 2. Will discontinue Coumadin and Plavix for now. Will start a heparin drip as a bridge until cardiology assesses the patient and make recommendations. 3. Gentle IV fluids for hydration. Will hold Dyazide. 4. Opiate analgesics as needed for pain will be ordered.   DVT prophylaxis:Coumadin >>> heparin drip  Code Status:full code Family Communication: discussed with son [name] Disposition Plan: likely skilled nursing facility placement when clinically appropriate.  Consults called: Arther Abbott M.D. (and cardiology pending). Admission status: Admit to inpatient telemetry   [facility] MD [facility] Pager [phone]   If 7PM-7AM, please contact night-coverage [email or web address] Password [email or web address]  11/29/2015, 1:21 PM

## 2015-11-30 ENCOUNTER — Encounter (HOSPITAL_COMMUNITY): Payer: Self-pay | Admitting: Physician Assistant

## 2015-11-30 ENCOUNTER — Inpatient Hospital Stay (HOSPITAL_COMMUNITY): Payer: Medicare Other

## 2015-11-30 DIAGNOSIS — Z01818 Encounter for other preprocedural examination: Secondary | ICD-10-CM

## 2015-11-30 DIAGNOSIS — E039 Hypothyroidism, unspecified: Secondary | ICD-10-CM

## 2015-11-30 DIAGNOSIS — F0150 Vascular dementia without behavioral disturbance: Secondary | ICD-10-CM

## 2015-11-30 DIAGNOSIS — Z7901 Long term (current) use of anticoagulants: Secondary | ICD-10-CM

## 2015-11-30 DIAGNOSIS — R079 Chest pain, unspecified: Secondary | ICD-10-CM

## 2015-11-30 DIAGNOSIS — I10 Essential (primary) hypertension: Secondary | ICD-10-CM

## 2015-11-30 DIAGNOSIS — Z954 Presence of other heart-valve replacement: Secondary | ICD-10-CM

## 2015-11-30 DIAGNOSIS — I482 Chronic atrial fibrillation, unspecified: Secondary | ICD-10-CM | POA: Diagnosis present

## 2015-11-30 DIAGNOSIS — Z8673 Personal history of transient ischemic attack (TIA), and cerebral infarction without residual deficits: Secondary | ICD-10-CM

## 2015-11-30 LAB — CBC
HEMATOCRIT: 34.4 % — AB (ref 36.0–46.0)
Hemoglobin: 11.5 g/dL — ABNORMAL LOW (ref 12.0–15.0)
MCH: 28.3 pg (ref 26.0–34.0)
MCHC: 33.4 g/dL (ref 30.0–36.0)
MCV: 84.7 fL (ref 78.0–100.0)
Platelets: 150 10*3/uL (ref 150–400)
RBC: 4.06 MIL/uL (ref 3.87–5.11)
RDW: 15 % (ref 11.5–15.5)
WBC: 7.1 10*3/uL (ref 4.0–10.5)

## 2015-11-30 LAB — HEPATIC FUNCTION PANEL
ALBUMIN: 3.7 g/dL (ref 3.5–5.0)
ALK PHOS: 52 U/L (ref 38–126)
ALT: 15 U/L (ref 14–54)
AST: 20 U/L (ref 15–41)
BILIRUBIN, TOTAL: 1.2 mg/dL
Bilirubin, Direct: 0.2 mg/dL (ref 0.1–0.5)
Indirect Bilirubin: 1 mg/dL — ABNORMAL HIGH (ref 0.3–0.9)
TOTAL PROTEIN: 5.8 g/dL — AB (ref 6.5–8.1)
Total Bilirubin: 1.2 mg/dL (ref 0.3–1.2)

## 2015-11-30 LAB — BASIC METABOLIC PANEL
Anion gap: 9 (ref 5–15)
BUN: 22 mg/dL — ABNORMAL HIGH (ref 6–20)
CHLORIDE: 105 mmol/L (ref 101–111)
CO2: 24 mmol/L (ref 22–32)
Calcium: 9.1 mg/dL (ref 8.9–10.3)
Creatinine, Ser: 0.93 mg/dL (ref 0.44–1.00)
GFR calc non Af Amer: 54 mL/min — ABNORMAL LOW (ref >60)
Glucose, Bld: 102 mg/dL — ABNORMAL HIGH (ref 65–99)
POTASSIUM: 3.9 mmol/L (ref 3.5–5.1)
SODIUM: 138 mmol/L (ref 135–145)

## 2015-11-30 LAB — SURGICAL PCR SCREEN
MRSA, PCR: NEGATIVE
Staphylococcus aureus: POSITIVE — AB

## 2015-11-30 LAB — PROTIME-INR
INR: 2.62 — AB (ref 0.00–1.49)
INR: 2.76 — AB (ref 0.00–1.49)
PROTHROMBIN TIME: 27.6 s — AB (ref 11.6–15.2)
PROTHROMBIN TIME: 28.7 s — AB (ref 11.6–15.2)

## 2015-11-30 LAB — HEPARIN LEVEL (UNFRACTIONATED)
HEPARIN UNFRACTIONATED: 0.35 [IU]/mL (ref 0.30–0.70)
Heparin Unfractionated: 0.17 IU/mL — ABNORMAL LOW (ref 0.30–0.70)
Heparin Unfractionated: 0.32 IU/mL (ref 0.30–0.70)

## 2015-11-30 MED ORDER — ONDANSETRON HCL 4 MG/2ML IJ SOLN
4.0000 mg | Freq: Four times a day (QID) | INTRAMUSCULAR | Status: DC | PRN
  Administered 2015-11-30 – 2015-12-02 (×2): 4 mg via INTRAVENOUS
  Filled 2015-11-30 (×2): qty 2

## 2015-11-30 MED ORDER — HEPARIN (PORCINE) IN NACL 100-0.45 UNIT/ML-% IJ SOLN
1050.0000 [IU]/h | INTRAMUSCULAR | Status: DC
  Administered 2015-11-30: 1050 [IU]/h via INTRAVENOUS
  Administered 2015-11-30: 1000 [IU]/h via INTRAVENOUS
  Filled 2015-11-30: qty 250

## 2015-11-30 MED ORDER — MUPIROCIN 2 % EX OINT
1.0000 "application " | TOPICAL_OINTMENT | Freq: Two times a day (BID) | CUTANEOUS | Status: DC
  Administered 2015-11-30 – 2015-12-04 (×8): 1 via NASAL
  Filled 2015-11-30 (×2): qty 22

## 2015-11-30 MED ORDER — CHLORHEXIDINE GLUCONATE CLOTH 2 % EX PADS
6.0000 | MEDICATED_PAD | Freq: Every day | CUTANEOUS | Status: DC
  Administered 2015-11-30 – 2015-12-03 (×3): 6 via TOPICAL

## 2015-11-30 NOTE — Progress Notes (Signed)
CRITICAL VALUE ALERT  Critical value received:  Staph Aureus Positive PCR  Date of notification:  11/30/15  Time of notification:  [id number]  Critical value read back:Yes.    Nurse who received alert:  Sharyn Blitz, RN  MD notified (1st page):  Dr. Caryn Section  Time of first page:  [phone]  MD notified (2nd page):  Time of second page:  Responding MD:    Time MD responded:

## 2015-11-30 NOTE — Clinical Social Work Placement (Signed)
   CLINICAL SOCIAL WORK PLACEMENT  NOTE  Date:  11/30/2015  Patient Details  Name: Courtney [name] MRN: [id number] Date of Birth: 1929/07/14  Clinical Social Work is seeking post-discharge placement for this patient at the [place] level of care (*CSW will initial, date and re-position this form in  chart as items are completed):  Yes   Patient/family provided with [place] Work Department's list of facilities offering this level of care within the geographic area requested by the patient (or if unable, by the patient's family).  Yes   Patient/family informed of their freedom to choose among providers that offer the needed level of care, that participate in Medicare, Medicaid or managed care program needed by the patient, have an available bed and are willing to accept the patient.  Yes   Patient/family informed of [place]'s ownership interest in [facility] and [facility], as well as of the fact that they are under no obligation to receive care at these facilities.  PASRR submitted to EDS on 11/30/15     PASRR number received on 11/30/15     Existing PASRR number confirmed on       FL2 transmitted to all facilities in geographic area requested by pt/family on 11/30/15     FL2 transmitted to all facilities within larger geographic area on       Patient informed that his/her managed care company has contracts with or will negotiate with certain facilities, including the following:            Patient/family informed of bed offers received.  Patient chooses bed at       Physician recommends and patient chooses bed at      Patient to be transferred to   on  .  Patient to be transferred to facility by       Patient family notified on   of transfer.  Name of family member notified:        PHYSICIAN       Additional Comment:    _______________________________________________ Salome Arnt, [place] 11/30/2015, 10:35  AM [phone]

## 2015-11-30 NOTE — Clinical Social Work Note (Signed)
Clinical Social Work Assessment  Patient Details  Name: Courtney [name] MRN: [id number] Date of Birth: 25-Nov-1928  Date of referral:  11/30/15               Reason for consult:  Discharge Planning                Permission sought to share information with:  Family Supports Permission granted to share information::  Yes, Verbal Permission Granted  Name::     Courtney [name]::     Relationship::  son  Contact Information:     Housing/Transportation Living arrangements for the past 2 months:  Single Family Home Source of Information:  Patient, Adult Children Patient Interpreter Needed:  None Criminal Activity/Legal Involvement Pertinent to Current Situation/Hospitalization:  No - Comment as needed Significant Relationships:  Adult Children Lives with:  Adult Children Do you feel safe going back to the place where you live?   (will need rehab first) Need for family participation in patient care:  Yes (Comment)  Care giving concerns:  Pt lives with son, but will need rehab due to hip fracture.   Social Worker assessment / plan:  CSW met with pt at bedside. Pt reports she lives with her son, Courtney [name]. He also care for his father. Pt shared that she lived in [place], but her son moved her here to live with him. Pt fell in the bathroom, fracturing her hip. Surgery scheduled for tomorrow. Pt is generally fairly active and works out at [employer] several times a week. Courtney [name] gives pt her medications, but she is otherwise independent. CSW discussed SNF with pt and with Courtney [name] on phone. They are aware of Medicare coverage/criteria. David requests placement at [facility].   Employment status:  Retired Forensic scientist:  Medicare PT Recommendations:  Not assessed at this time Information / Referral to community resources:  [place]  Patient/Family's Response to care:  Pt and son request for CSW to initiate bed search.   Patient/Family's Understanding of and Emotional Response to Diagnosis,  Current Treatment, and Prognosis:  Pt and pt's son aware of plan for surgery tomorrow. Pt is very positive about rehab.   Emotional Assessment Appearance:  Appears stated age Attitude/Demeanor/Rapport:  Other (Very pleasant) Affect (typically observed):  Appropriate Orientation:  Oriented to Self, Oriented to Place, Oriented to  Time Alcohol / Substance use:  Not Applicable Psych involvement (Current and /or in the community):  No (Comment)  Discharge Needs  Concerns to be addressed:  Discharge Planning Concerns Readmission within the last 30 days:  No Current discharge risk:  Physical Impairment Barriers to Discharge:  Continued Medical Work up   Salome Arnt, [place] 11/30/2015, 10:51 AM [phone]

## 2015-11-30 NOTE — Progress Notes (Signed)
ANTICOAGULATION CONSULT NOTE - follow up  Pharmacy Consult for heparin Indication: MVR  Allergies  Allergen Reactions  . Penicillins Swelling    Has patient had a PCN reaction causing immediate rash, facial/tongue/throat swelling, SOB or lightheadedness with hypotension: Yes Has patient had a PCN reaction causing severe rash involving mucus membranes or skin necrosis: No Has patient had a PCN reaction that required hospitalization No Has patient had a PCN reaction occurring within the last 10 years: No If all of the above answers are "NO", then may proceed with Cephalosporin use. Whole body "swelled up" and had to go to the hospital   Patient Measurements: Height: 5\' 6"  (167.6 cm) Weight: 132 lb (59.875 kg) IBW/kg (Calculated) : 59.3 Heparin Dosing Weight: 59.9 kg  Vital Signs: Temp: 98.3 F (36.8 C) (04/27 1343) Temp Source: Oral (04/27 1343) BP: 133/82 mmHg (04/27 1343) Pulse Rate: 68 (04/27 1343)  Labs:  Recent Labs  11/29/15 0909 11/30/15 0040 11/30/15 0525 11/30/15 1015 11/30/15 1416  HGB 12.5  --  11.5*  --   --   HCT 38.6  --  34.4*  --   --   PLT 168  --  150  --   --   LABPROT 25.0* 27.6*  --   --   --   INR 2.30* 2.62*  --   --   --   HEPARINUNFRC  --  0.17*  --  0.35 0.32  CREATININE 1.01*  --  0.93  --   --    Estimated Creatinine Clearance: 41.4 mL/min (by C-G formula based on Cr of 0.93).  Medical History: Past Medical History  Diagnosis Date  . Stroke ([place])   . Hypertension   . Vascular dementia   . Hypothyroidism   . History of mitral valve replacement   . UTI (lower urinary tract infection)   . Permanent atrial fibrillation ([place])   . Chronic anticoagulation     coumadin, CHADS2VASC=6   Medications:  Prescriptions prior to admission  Medication Sig Dispense Refill Last Dose  . amLODipine (NORVASC) 2.5 MG tablet Take 2.5 mg by mouth daily.   11/29/2015 at Unknown time  . atenolol (TENORMIN) 25 MG tablet Take 12.5 mg by mouth.   11/29/2015  at Unknown time  . b complex vitamins tablet Take 1 tablet by mouth daily.   11/29/2015 at Unknown time  . clopidogrel (PLAVIX) 75 MG tablet Take 75 mg by mouth daily.   11/29/2015 at Unknown time  . levothyroxine (SYNTHROID, LEVOTHROID) 125 MCG tablet Take 125 mcg by mouth daily before breakfast.   11/29/2015 at Unknown time  . lisinopril (PRINIVIL,ZESTRIL) 2.5 MG tablet Take 2.5 mg by mouth daily.   11/29/2015 at Unknown time  . triamterene-hydrochlorothiazide (DYAZIDE) 37.5-25 MG capsule Take 1 capsule by mouth daily.   11/29/2015 at Unknown time  . vitamin C (ASCORBIC ACID) 500 MG tablet Take 500 mg by mouth 2 (two) times daily.   11/29/2015 at Unknown time  . warfarin (COUMADIN) 2 MG tablet Take 2 mg by mouth daily. Pt takes 2mg  one day and 4mg  the next   11/28/2015 at 1700  . acetaminophen (TYLENOL) 500 MG tablet Take 500 mg by mouth every 6 (six) hours as needed for mild pain.   unknown  . ciprofloxacin (CIPRO) 500 MG tablet Take 1 tablet (500 mg total) by mouth 2 (two) times daily. Antibiotic be taken for 3 more days, starting tomorrow. (Patient not taking: Reported on 11/29/2015) 6 tablet 0   . docusate  sodium (COLACE) 100 MG capsule Take 100 mg by mouth 2 (two) times daily.   unknown  . ibandronate (BONIVA) 150 MG tablet Take 150 mg by mouth every 30 (thirty) days.  0 2 weeks   Assessment: 82 yo lady on coumadin PTA for MVR.  She broke her hip and coumadin will be held in anticipation for surgery.  Her INR remains at therapeutic level.  Defer any reversal to MD.  Heparin level is now therapeutic x 2 consecutive checks.  No bleeding reported. H/H, PLTs slightly trending down.   Coumadin on hold.    Goal of Therapy:  Heparin level 0.3-0.7 units/ml Monitor platelets by anticoagulation protocol: Yes   Plan:  Continue Heparin drip at 1050 units/hr  Daily HL, INR and CBC Monitor for bleeding complications  Hart Robinsons A 11/30/2015,3:51 PM

## 2015-11-30 NOTE — Consult Note (Addendum)
CARDIOLOGY CONSULT NOTE   Patient ID: Courtney [name] MRN: [id number] DOB/AGE: January 20, 1929 82 y.o.  Admit date: 11/29/2015  Primary Physician   No primary care provider on file. Primary Cardiologist  Dr. Maisie Fus, [place], [place] Reason for Consultation   Anticoagulation recommendations, pt w/ hip fx  [id number] Courtney [name] is a 82 y.o. year old female with a history of CVA, HTN, vascular dementia, MVR, atrial fib on coumadin.  She had a mechanical fall with a hip fracture and needs surgery. Cardiology was asked to consult.  Courtney [name] is generally very active. She recently quit driving because she got lost, but stays busy around the house. She is able to climb stairs without stopping and does not feel particularly short of breath at the top. She never gets chest pain. She does not think she had any bypass grafts placed at the time of her mitral valve replacement, which was at [facility] in [facility] less than 10 years ago.    She has some mild daytime edema, but no orthopnea or PND. Her cardiac status has been stable, no hospitalizations for chest pain or heart failure. She is compliant with her Coumadin. She does not get palpitations and is not aware of her irregular heart rate.     Past Medical History  Diagnosis Date  . Stroke ([place])   . Hypertension   . Vascular dementia   . Hypothyroidism   . History of mitral valve replacement   . UTI (lower urinary tract infection)   . Permanent atrial fibrillation ([place])   . Chronic anticoagulation     coumadin, CHADS2VASC=6     Past Surgical History  Procedure Laterality Date  . Mitral valve replacement    . Abdominal hysterectomy      Allergies  Allergen Reactions  . Penicillins Swelling    Has patient had a PCN reaction causing immediate rash, facial/tongue/throat swelling, SOB or lightheadedness with hypotension: Yes Has patient had a PCN reaction causing severe rash involving mucus membranes or skin necrosis: No Has  patient had a PCN reaction that required hospitalization No Has patient had a PCN reaction occurring within the last 10 years: No If all of the above answers are "NO", then may proceed with Cephalosporin use. Whole body "swelled up" and had to go to the hospital    I have reviewed the patient's current medications . amLODipine  2.5 mg Oral Daily  . atenolol  12.5 mg Oral Daily  . docusate sodium  100 mg Oral BID  . levothyroxine  125 mcg Oral QAC breakfast  . lisinopril  2.5 mg Oral Daily  . vitamin C  500 mg Oral BID   . 0.9 % NaCl with KCl 20 mEq / L 70 mL/hr at 11/30/15 0429  . heparin 1,050 Units/hr (11/30/15 0827)   acetaminophen **OR** acetaminophen, albuterol, HYDROcodone-acetaminophen, morphine injection  Medication Sig  amLODipine (NORVASC) 2.5 MG tablet Take 2.5 mg by mouth daily.  atenolol (TENORMIN) 25 MG tablet Take 12.5 mg by mouth.  b complex vitamins tablet Take 1 tablet by mouth daily.  clopidogrel (PLAVIX) 75 MG tablet Take 75 mg by mouth daily.  levothyroxine (SYNTHROID, LEVOTHROID) 125 MCG tablet Take 125 mcg by mouth daily before breakfast.  lisinopril (PRINIVIL,ZESTRIL) 2.5 MG tablet Take 2.5 mg by mouth daily.  triamterene-hydrochlorothiazide (DYAZIDE) 37.5-25 MG capsule Take 1 capsule by mouth daily.  vitamin C (ASCORBIC ACID) 500 MG tablet Take 500 mg by mouth 2 (two) times daily.  warfarin (COUMADIN)  2 MG tablet Take 2 mg by mouth daily. Pt takes 2mg  one day and 4mg  the next  acetaminophen (TYLENOL) 500 MG tablet Take 500 mg by mouth every 6 (six) hours as needed for mild pain.  ciprofloxacin (CIPRO) 500 MG tablet Take 1 tablet (500 mg total) by mouth 2 (two) times daily. Antibiotic be taken for 3 more days, starting tomorrow. Patient not taking: Reported on 11/29/2015  docusate sodium (COLACE) 100 MG capsule Take 100 mg by mouth 2 (two) times daily.  ibandronate (BONIVA) 150 MG tablet Take 150 mg by mouth every 30 (thirty) days.     Social History    Social History  . Marital Status: Divorced    Spouse Name: N/A  . Number of Children: N/A  . Years of Education: N/A   Occupational History  . Retired    Social History Main Topics  . Smoking status: Never Smoker   . Smokeless tobacco: Not on file  . Alcohol Use: No  . Drug Use: No  . Sexual Activity: No   Other Topics Concern  . Not on file   Social History Narrative   Lives with her ex-husband and son when in [place]. Has a house in [place], [place].    Family Status  Relation Status Death Age  . Mother Deceased   . Father Deceased    History reviewed. No pertinent family history.   ROS:  Full 14 point review of systems complete and found to be negative unless listed above.  Physical Exam: Blood pressure 132/66, pulse 71, temperature 98.1 F (36.7 C), temperature source Oral, resp. rate 18, height 5\' 6"  (1.676 m), weight 132 lb (59.875 kg), SpO2 95 %.  General: Well developed, well nourished, female in no acute distress Head: Eyes PERRLA, No xanthomas.   Normocephalic and atraumatic, oropharynx without edema or exudate. Dentition: fair Lungs: slightly decreased BS bases Heart: Heart irregular rate and rhythm with S1, S2, 2/6 murmur, + valve click. pulses are 2+ all 4 extrem.   Neck: No carotid bruits. No lymphadenopathy.  JVD not elevated. Abdomen: Bowel sounds present, abdomen soft and non-tender without masses or hernias noted. Msk:  No spine or cva tenderness. No weakness, LLE joint deformity noted, no effusions. Extremities: No clubbing or cyanosis. No edema.  Neuro: Alert and oriented X 3. No focal deficits noted. Psych:  Good affect, responds appropriately Skin: No rashes or lesions noted. Chronic stasis changes bilateral LE  Labs:   Lab Results  Component Value Date   WBC 7.1 11/30/2015   HGB 11.5* 11/30/2015   HCT 34.4* 11/30/2015   MCV 84.7 11/30/2015   PLT 150 11/30/2015    Recent Labs  11/30/15 0040  INR 2.62*     Recent Labs Lab  11/30/15 0525  NA 138  K 3.9  CL 105  CO2 24  BUN 22*  CREATININE 0.93  CALCIUM 9.1  PROT 5.8*  BILITOT 1.2  ALKPHOS 52  ALT 15  AST 20  GLUCOSE 102*  ALBUMIN 3.7   Echo: ordered  ECG:  Atrial fib, controlled VR  Radiology:  Dg Chest 1 View 11/29/2015  CLINICAL DATA:  Fall, left hip pain EXAM: CHEST 1 VIEW COMPARISON:  07/12/2015 FINDINGS: Cardiomegaly evident with the prior mitral valve replacement. Stable vascular congestion and hyperinflation. No focal pneumonia, collapse or consolidation. Negative for edema, effusion or pneumothorax. Trachea midline. Aortic atherosclerosis evident. Bones are osteopenic. Degenerative changes noted of the spine. IMPRESSION: Stable cardiomegaly and postoperative findings. No interval change or  superimposed acute process. Electronically Signed   By: Jerilynn Mages.  Shick M.D.   On: 11/29/2015 09:20   Dg Hip Unilat With Pelvis 2-3 Views Left 11/29/2015  CLINICAL DATA:  Status post fall in the bathroom this morning now complaining of pain in the upper left thigh EXAM: DG HIP (WITH OR WITHOUT PELVIS) 2-3V LEFT COMPARISON:  None in PACs FINDINGS: The bones are osteopenic. There is an acute mildly angulated fracture of the intertrochanteric region of the left hip. The left acetabulum and pubic rami appear intact as does the iliac crest. The right hemipelvis and right hip are unremarkable. IMPRESSION: There is an acute slightly angulated intertrochanteric fracture of the left hip. No pelvic fracture is observed. Electronically Signed   By: David  [place] M.D.   On: 11/29/2015 09:18    ASSESSMENT AND PLAN:   The patient was seen today by Dr Bronson Ing, the patient evaluated and the data reviewed.   Principal Problem:   Intertrochanteric fracture of left hip (HCC) - per Surgical team/IM    Preop cardiac evaluation - pt has multiple cardiac risk factors, but no known CAD (not sure why she is on Plavix) -have contacted primary MDs office for records - per pt reports, can  do 4 mets without chest pain or SOB - do not think ischemic eval needed before surgery    H/O mitral valve replacement - have ordered an echo - notes from primary MD pending.    Atrial fib, permanent - rate is controlled - would continue BB in the perioperative period, can change to metoprolol 2.5 mg q 4-6 hr while NPO    Chronic anticoagulation, CHADS2VASC=6 - coumadin on hold - MD advise on starting heparin when INR < 2.5 since she has a valve  Otherwise, per IM Active Problems:   Dehydration   Vascular dementia   Essential hypertension   Fall at home   Hypothyroidism   Signed: Lenoard Aden 11/30/2015 10:43 AM [place] [id number]  Co-Sign MD  The patient was seen and examined, and I agree with the history, physical exam, assessment and plan as documented above which has been discussed with R. Barrett PA-C, with modifications as noted below. Pt denies chest pain/shortness of breath. Does not need ischemic evaluation prior to surgery. Echocardiogram ordered to assess LV function and mitral valve prosthesis. Continue IV heparin throughout perioperative period and restart warfarin when deemed feasible by orthopedic surgery. I assume she is on Plavix for prior h/o CVA but not entirely certain. Attempting to obtain records. No known CAD per patient. Agree with continued perioperative beta blockade to attenuate CV risk.  Kate Sable, MD, [facility]  11/30/2015 11:41 AM  ADDENDUM: Old nuclear stress test showed myocardial scar, no ischemia, indicative of CAD. No changes to management. Await echocardiogram results.

## 2015-11-30 NOTE — Progress Notes (Signed)
Pt had a 7 beat run of VTACH on telemetry this morning.  Pt asymptomatic, alert, BP 132/66 HR 71.  Dr. Caryn Section paged and made aware.  Will continue to monitor patient.

## 2015-11-30 NOTE — NC FL2 (Signed)
[place] LEVEL OF CARE SCREENING TOOL     IDENTIFICATION  Patient Name: Courtney [name] Birthdate: June 19, 1929 Sex: female Admission Date (Current Location): 11/29/2015  [facility] and [place] Number:  [employer] and Address:  [facility] [address] [place]      Provider Number: [id number]  Attending Physician Name and Address:  Rexene Alberts, MD  Relative Name and Phone Number:       Current Level of Care: Hospital Recommended Level of Care: [place] Prior Approval Number:    Date Approved/Denied:   PASRR Number: [id number]  Discharge Plan: SNF    Current Diagnoses: Patient Active Problem List   Diagnosis Date Noted  . Closed left hip fracture ([place]) 11/29/2015  . Fall at home 11/29/2015  . Intertrochanteric fracture of left hip ([place]) 11/29/2015  . Hypothyroidism 11/29/2015  . Urinary tract infectious disease   . Nausea and vomiting 07/13/2015  . Thrombocytopenia ([place]) 07/13/2015  . Chronic anticoagulation 07/13/2015  . H/O mitral valve replacement 07/13/2015  . AKI (acute kidney injury) ([place]) 07/13/2015  . Fever 07/12/2015  . UTI (lower urinary tract infection) 07/12/2015  . Dehydration 07/12/2015  . Vascular dementia 07/12/2015  . Essential hypertension 07/12/2015    Orientation RESPIRATION BLADDER Height & Weight     Self, Time, Situation, Place  Normal Continent Weight: 132 lb (59.875 kg) Height:  5\' 6"  (167.6 cm)  BEHAVIORAL SYMPTOMS/MOOD NEUROLOGICAL BOWEL NUTRITION STATUS  Other (Comment) (n/a)  (n/a) Continent Diet (low sodium heart healthy)  AMBULATORY STATUS COMMUNICATION OF NEEDS Skin     Verbally Skin abrasions, Bruising                       Personal Care Assistance Level of Assistance   (will update after surgery. independent at baseline)           Functional Limitations Info  Sight, Hearing, Speech Sight Info: Adequate Hearing Info: Impaired Speech Info: Impaired     SPECIAL CARE FACTORS FREQUENCY  PT (By licensed PT)                    Contractures Contractures Info: Not present    Additional Factors Info  Code Status, Allergies Code Status Info: Full code Allergies Info: Penicillins           Current Medications (11/30/2015):  This is the current hospital active medication list Current Facility-Administered Medications  Medication Dose Route Frequency Provider Last Rate Last Dose  . 0.9 % NaCl with KCl 20 mEq/ L  infusion   Intravenous Continuous Rexene Alberts, MD 70 mL/hr at 11/30/15 0429    . acetaminophen (TYLENOL) tablet 650 mg  650 mg Oral Q6H PRN Rexene Alberts, MD       Or  . acetaminophen (TYLENOL) suppository 650 mg  650 mg Rectal Q6H PRN Rexene Alberts, MD      . albuterol (PROVENTIL) (2.5 MG/3ML) 0.083% nebulizer solution 2.5 mg  2.5 mg Nebulization Q2H PRN Rexene Alberts, MD      . amLODipine (NORVASC) tablet 2.5 mg  2.5 mg Oral Daily Rexene Alberts, MD   2.5 mg at 11/30/15 0921  . atenolol (TENORMIN) tablet 12.5 mg  12.5 mg Oral Daily Rexene Alberts, MD   12.5 mg at 11/30/15 0920  . docusate sodium (COLACE) capsule 100 mg  100 mg Oral BID Rexene Alberts, MD   100 mg at 11/30/15 0920  . heparin ADULT infusion 100 units/mL (  25000 units/250 mL)  1,050 Units/hr Intravenous Continuous Rexene Alberts, MD 10.5 mL/hr at 11/30/15 0827 1,050 Units/hr at 11/30/15 0827  . HYDROcodone-acetaminophen (NORCO/VICODIN) 5-325 MG per tablet 1-2 tablet  1-2 tablet Oral Q4H PRN Rexene Alberts, MD   2 tablet at 11/30/15 0140  . levothyroxine (SYNTHROID, LEVOTHROID) tablet 125 mcg  125 mcg Oral QAC breakfast Rexene Alberts, MD   125 mcg at 11/30/15 [phone]  . lisinopril (PRINIVIL,ZESTRIL) tablet 2.5 mg  2.5 mg Oral Daily Rexene Alberts, MD   2.5 mg at 11/30/15 0920  . morphine 2 MG/ML injection 2 mg  2 mg Intravenous Q2H PRN Rexene Alberts, MD   2 mg at 11/29/15 2213  . vitamin C (ASCORBIC ACID) tablet 500 mg  500 mg Oral BID Rexene Alberts, MD   500 mg at  11/30/15 [id number]     Discharge Medications: Please see discharge summary for a list of discharge medications.  Relevant Imaging Results:  Relevant Lab Results:   Additional Information SS#: [id number]  Salome Arnt, [place]

## 2015-11-30 NOTE — Progress Notes (Signed)
ANTICOAGULATION CONSULT NOTE - follow up  Pharmacy Consult for heparin Indication: MVR  Allergies  Allergen Reactions  . Penicillins Swelling    Has patient had a PCN reaction causing immediate rash, facial/tongue/throat swelling, SOB or lightheadedness with hypotension: Yes Has patient had a PCN reaction causing severe rash involving mucus membranes or skin necrosis: No Has patient had a PCN reaction that required hospitalization No Has patient had a PCN reaction occurring within the last 10 years: No If all of the above answers are "NO", then may proceed with Cephalosporin use. Whole body "swelled up" and had to go to the hospital   Patient Measurements: Height: 5\' 6"  (167.6 cm) Weight: 132 lb (59.875 kg) IBW/kg (Calculated) : 59.3 Heparin Dosing Weight: 59.9 kg  Vital Signs: Temp: 98.1 F (36.7 C) (04/27 0627) Temp Source: Oral (04/27 0627) BP: 110/74 mmHg (04/27 0627) Pulse Rate: 72 (04/27 0627)  Labs:  Recent Labs  11/29/15 0909 11/30/15 0040 11/30/15 0525  HGB 12.5  --  11.5*  HCT 38.6  --  34.4*  PLT 168  --  150  LABPROT 25.0* 27.6*  --   INR 2.30* 2.62*  --   HEPARINUNFRC  --  0.17*  --   CREATININE 1.01*  --  0.93   Estimated Creatinine Clearance: 41.4 mL/min (by C-G formula based on Cr of 0.93).  Medical History: Past Medical History  Diagnosis Date  . Stroke ([place])   . Hypertension   . Vascular dementia   . Hypothyroidism   . History of mitral valve replacement   . UTI (lower urinary tract infection)    Medications:  Prescriptions prior to admission  Medication Sig Dispense Refill Last Dose  . amLODipine (NORVASC) 2.5 MG tablet Take 2.5 mg by mouth daily.   11/29/2015 at Unknown time  . atenolol (TENORMIN) 25 MG tablet Take 12.5 mg by mouth.   11/29/2015 at Unknown time  . b complex vitamins tablet Take 1 tablet by mouth daily.   11/29/2015 at Unknown time  . clopidogrel (PLAVIX) 75 MG tablet Take 75 mg by mouth daily.   11/29/2015 at Unknown time   . levothyroxine (SYNTHROID, LEVOTHROID) 125 MCG tablet Take 125 mcg by mouth daily before breakfast.   11/29/2015 at Unknown time  . lisinopril (PRINIVIL,ZESTRIL) 2.5 MG tablet Take 2.5 mg by mouth daily.   11/29/2015 at Unknown time  . triamterene-hydrochlorothiazide (DYAZIDE) 37.5-25 MG capsule Take 1 capsule by mouth daily.   11/29/2015 at Unknown time  . vitamin C (ASCORBIC ACID) 500 MG tablet Take 500 mg by mouth 2 (two) times daily.   11/29/2015 at Unknown time  . warfarin (COUMADIN) 2 MG tablet Take 2 mg by mouth daily. Pt takes 2mg  one day and 4mg  the next   11/28/2015 at 1700  . acetaminophen (TYLENOL) 500 MG tablet Take 500 mg by mouth every 6 (six) hours as needed for mild pain.   unknown  . ciprofloxacin (CIPRO) 500 MG tablet Take 1 tablet (500 mg total) by mouth 2 (two) times daily. Antibiotic be taken for 3 more days, starting tomorrow. (Patient not taking: Reported on 11/29/2015) 6 tablet 0   . docusate sodium (COLACE) 100 MG capsule Take 100 mg by mouth 2 (two) times daily.   unknown  . ibandronate (BONIVA) 150 MG tablet Take 150 mg by mouth every 30 (thirty) days.  0 2 weeks   Assessment: 82 yo lady on coumadin PTA for MVR.  She broke her hip and coumadin will be held in  anticipation for surgery.  Her INR remains at therapeutic level.  Defer any reversal to MD.  Heparin level is below goal but given that pt is "therapeutic" with Coumadin will not aggressively increase Heparin gtt at this time.  H/H, PLTs slightly trending down.    Goal of Therapy:  Heparin level 0.3-0.7 units/ml Monitor platelets by anticoagulation protocol: Yes   Plan:  Increase Heparin drip to 1050 units/hr  Recheck heparin level and INR in ~ 6 hours Daily HL, INR and CBC Monitor for bleeding complications  Hart Robinsons A 11/30/2015,8:22 AM

## 2015-11-30 NOTE — Progress Notes (Signed)
PROGRESS NOTE    Courtney [name]  [id number] DOB: 07-Jul-1929 DOA: 11/29/2015 PCP: No primary care provider on file. M.D. in [place]. Outpatient Specialists:     Brief Narrative:   Patient is an 82 year old woman with a history of mitral valve replacement on Coumadin, chronic atrial fibrillation, vascular dementia from a previous stroke, hypothyroidism, and hypertension. She presented to the ED on 11/29/2015 after falling at home. She complained of left hip pain. In the ED, she was afebrile and hemodynamically stable. Her lab data were significant for BUN of 24, creatinine of 1.01, and INR of 2.3. Pelvic and hip x-ray revealed an acute slightly angulated intertrochanteric fracture of the left hip with no pelvic fracture observed. Her chest x-ray revealed no acute findings. She was admitted for evaluation and management of acute left hip fracture.  Assessment & Plan:   Principal Problem:   Intertrochanteric fracture of left hip [facility]) Active Problems:   Chronic anticoagulation   H/O mitral valve replacement   Fall at home   Chronic atrial fibrillation [facility])   Dehydration   Vascular dementia   Essential hypertension   Hypothyroidism   1. Fall resulting in acute intratrochanteric fracture of the left hip. Patient was started on analgesics as needed for pain. Orthopedic surgeon, Dr. Aline Brochure was consulted. He requested a cardiology consult regarding whether the patient's chronic anticoagulation should be bridged or not. In the meantime, Coumadin was withheld and she was started on a heparin drip. Cardiologist, Dr. Jacinta Shoe agreed with perioperative heparin and deferred restarting Coumadin when feasible by orthopedic surgery. -Operative repair is planned for tomorrow.  Chronic anticoagulation secondary to mitral valve replacement and chronic atrial fibrillation. Patient is treated chronically with Coumadin for anticoagulation and atenolol for rate control. -As above, Coumadin was withheld  and she is being bridged with heparin. Atenolol was restarted. Her rate has been controlled. -Cardiology opined and ordered a 2-D echocardiogram to evaluate her LV function and mitral valve prosthesis. Of note, an old nuclear stress test was reviewed by cardiology and it showed myocardial scar, no ischemia, indicative of CAD.  Hypertension. Patient is treated chronically with amlodipine, lisinopril, Dyazide, and atenolol. On admission, her blood pressure was on the lower side of normal, so Dyazide was withheld. Her blood pressure still stable in control.  Mild dehydration. On admission, patient's BUN was 25 and her creatinine was 1.01. It was felt that she was mildly dehydrated from the fallen from Dyazide. She was started on gentle IV fluids and Dyazide was withheld. Her BUN and creatinine have improved.  Hypothyroidism. Patient was continued on Synthroid.   DVT prophylaxis: (Lovenox/Heparin/SCD's/anticoagulated/None (if comfort care) Code Status: (Full/Partial - specify details) Family Communication: (Specify name, relationship & date discussed. NO "discussed with patient") Disposition Plan: (specify when and where you expect patient to be discharged)   Consultants:   Cardiology  Orthopedic surgery  Procedures:   Pending left hip repair  Pending echo  Antimicrobials:  None    Subjective: Patient has no complaints of left leg pain, until she tries to move it. This afternoon, nursing reports some confusion.  Objective: Filed Vitals:   11/29/15 2158 11/30/15 0627 11/30/15 0917 11/30/15 1343  BP: 118/66 110/74 132/66 133/82  Pulse: 66 72 71 68  Temp: 98.1 F (36.7 C) 98.1 F (36.7 C)  98.3 F (36.8 C)  TempSrc: Oral Oral  Oral  Resp: 18 18  19   Height:      Weight:      SpO2: 94% 95%  97%  Intake/Output Summary (Last 24 hours) at 11/30/15 1629 Last data filed at 11/30/15 [id number]  Gross per 24 hour  Intake    240 ml  Output    800 ml  Net   -560 ml   Filed  Weights   11/29/15 0819  Weight: 59.875 kg (132 lb)    Examination:  General exam: Appears calm and comfortable; No acute distress. Respiratory system: Clear to auscultation. Respiratory effort normal. Cardiovascular system: Irregular, irregular, with systolic click. RRR.  No pedal edema. Gastrointestinal system: Abdomen is nondistended, soft and nontender. No organomegaly or masses felt. Normal bowel sounds heard. Central nervous system: Alert and oriented to herself, and hospital, but she is not oriented to the name of the hospital, time, or year. She follows commands. Her speech is clear. No focal neurological deficits. Extremities: Shortening of her left leg; no erythema, but mild to moderate tenderness palpated over the left hip area; no pedal edema.  Skin: No rashes, lesions or ulcers Psychiatry: Pleasant affect. Speech is clear.     Data Reviewed: I have personally reviewed following labs and imaging studies  CBC:  Recent Labs Lab 11/29/15 0909 11/30/15 0525  WBC 7.8 7.1  NEUTROABS 6.6  --   HGB 12.5 11.5*  HCT 38.6 34.4*  MCV 85.6 84.7  PLT 168 [id number]   Basic Metabolic Panel:  Recent Labs Lab 11/29/15 0909 11/30/15 0525  NA 138 138  K 3.7 3.9  CL 101 105  CO2 29 24  GLUCOSE 92 102*  BUN 24* 22*  CREATININE 1.01* 0.93  CALCIUM 10.0 9.1   GFR: Estimated Creatinine Clearance: 41.4 mL/min (by C-G formula based on Cr of 0.93). Liver Function Tests:  Recent Labs Lab 11/30/15 0525  AST 20  ALT 15  ALKPHOS 52  BILITOT 1.2  PROT 5.8*  ALBUMIN 3.7   No results for input(s): LIPASE, AMYLASE in the last 168 hours. No results for input(s): AMMONIA in the last 168 hours. Coagulation Profile:  Recent Labs Lab 11/29/15 0909 11/30/15 0040  INR 2.30* 2.62*   Cardiac Enzymes: No results for input(s): CKTOTAL, CKMB, CKMBINDEX, TROPONINI in the last 168 hours. BNP (last 3 results) No results for input(s): PROBNP in the last 8760 hours. HbA1C: No results  for input(s): HGBA1C in the last 72 hours. CBG: No results for input(s): GLUCAP in the last 168 hours. Lipid Profile: No results for input(s): CHOL, HDL, LDLCALC, TRIG, CHOLHDL, LDLDIRECT in the last 72 hours. Thyroid Function Tests: No results for input(s): TSH, T4TOTAL, FREET4, T3FREE, THYROIDAB in the last 72 hours. Anemia Panel: No results for input(s): VITAMINB12, FOLATE, FERRITIN, TIBC, IRON, RETICCTPCT in the last 72 hours. Urine analysis:    Component Value Date/Time   COLORURINE YELLOW 07/12/2015 1345   APPEARANCEUR HAZY* 07/12/2015 1345   LABSPEC 1.020 07/12/2015 1345   PHURINE 6.0 07/12/2015 1345   GLUCOSEU NEGATIVE 07/12/2015 1345   HGBUR LARGE* 07/12/2015 1345   BILIRUBINUR NEGATIVE 07/12/2015 1345   KETONESUR TRACE* 07/12/2015 1345   PROTEINUR 100* 07/12/2015 1345   NITRITE POSITIVE* 07/12/2015 1345   LEUKOCYTESUR MODERATE* 07/12/2015 1345   Sepsis Labs: @LABRCNTIP (procalcitonin:4,lacticidven:4)  ) Recent Results (from the past 240 hour(s))  Surgical pcr screen     Status: Abnormal   Collection Time: 11/30/15  9:08 AM  Result Value Ref Range Status   MRSA, PCR NEGATIVE NEGATIVE Final   Staphylococcus aureus POSITIVE (A) NEGATIVE Final    Comment:        The Xpert SA Assay (FDA approved  for NASAL specimens in patients over 64 years of age), is one component of a comprehensive surveillance program.  Test performance has been validated by [facility] for patients greater than or equal to 68 year old. It is not intended to diagnose infection nor to guide or monitor treatment. RESULT CALLED TO, READ BACK BY AND VERIFIED WITH: MAYS,J. AT 1304 ON 11/30/2015 BY BAUGHAM,M.          Radiology Studies: Dg Chest 1 View  11/29/2015  CLINICAL DATA:  Fall, left hip pain EXAM: CHEST 1 VIEW COMPARISON:  07/12/2015 FINDINGS: Cardiomegaly evident with the prior mitral valve replacement. Stable vascular congestion and hyperinflation. No focal pneumonia, collapse or  consolidation. Negative for edema, effusion or pneumothorax. Trachea midline. Aortic atherosclerosis evident. Bones are osteopenic. Degenerative changes noted of the spine. IMPRESSION: Stable cardiomegaly and postoperative findings. No interval change or superimposed acute process. Electronically Signed   By: Jerilynn Mages.  Shick M.D.   On: 11/29/2015 09:20   Dg Hip Unilat With Pelvis 2-3 Views Left  11/29/2015  CLINICAL DATA:  Status post fall in the bathroom this morning now complaining of pain in the upper left thigh EXAM: DG HIP (WITH OR WITHOUT PELVIS) 2-3V LEFT COMPARISON:  None in PACs FINDINGS: The bones are osteopenic. There is an acute mildly angulated fracture of the intertrochanteric region of the left hip. The left acetabulum and pubic rami appear intact as does the iliac crest. The right hemipelvis and right hip are unremarkable. IMPRESSION: There is an acute slightly angulated intertrochanteric fracture of the left hip. No pelvic fracture is observed. Electronically Signed   By: David  Martinique M.D.   On: 11/29/2015 09:18        Scheduled Meds: . amLODipine  2.5 mg Oral Daily  . atenolol  12.5 mg Oral Daily  . Chlorhexidine Gluconate Cloth  6 each Topical Daily  . docusate sodium  100 mg Oral BID  . levothyroxine  125 mcg Oral QAC breakfast  . lisinopril  2.5 mg Oral Daily  . mupirocin ointment  1 application Nasal BID  . vitamin C  500 mg Oral BID   Continuous Infusions: . 0.9 % NaCl with KCl 20 mEq / L 70 mL/hr at 11/30/15 0429  . heparin 1,050 Units/hr (11/30/15 0827)     LOS: 1 day    Time spent: 48 minutes    Rexene Alberts, MD [facility] Pager [phone]  If 7PM-7AM, please contact night-coverage [email or web address] Password [facility] 11/30/2015, 4:29 PM

## 2015-12-01 ENCOUNTER — Encounter (HOSPITAL_COMMUNITY)
Admission: EM | Disposition: A | Discharge status: Skilled Nursing Facility | Payer: Self-pay | Source: Home / Self Care | Attending: Internal Medicine

## 2015-12-01 ENCOUNTER — Inpatient Hospital Stay (HOSPITAL_COMMUNITY): Payer: Medicare Other | Admitting: Anesthesiology

## 2015-12-01 ENCOUNTER — Encounter (HOSPITAL_COMMUNITY): Payer: Self-pay | Admitting: Anesthesiology

## 2015-12-01 ENCOUNTER — Inpatient Hospital Stay (HOSPITAL_COMMUNITY): Payer: Medicare Other

## 2015-12-01 DIAGNOSIS — I272 Other secondary pulmonary hypertension: Secondary | ICD-10-CM

## 2015-12-01 DIAGNOSIS — I517 Cardiomegaly: Secondary | ICD-10-CM

## 2015-12-01 DIAGNOSIS — D696 Thrombocytopenia, unspecified: Secondary | ICD-10-CM

## 2015-12-01 DIAGNOSIS — S72143A Displaced intertrochanteric fracture of unspecified femur, initial encounter for closed fracture: Secondary | ICD-10-CM | POA: Diagnosis present

## 2015-12-01 HISTORY — DX: Cardiomegaly: I51.7

## 2015-12-01 HISTORY — DX: Pulmonary hypertension, unspecified: I27.20

## 2015-12-01 HISTORY — PX: INTRAMEDULLARY (IM) NAIL INTERTROCHANTERIC: SHX5875

## 2015-12-01 LAB — CBC
HCT: 33.2 % — ABNORMAL LOW (ref 36.0–46.0)
HEMATOCRIT: 33.3 % — AB (ref 36.0–46.0)
Hemoglobin: 11 g/dL — ABNORMAL LOW (ref 12.0–15.0)
Hemoglobin: 11.2 g/dL — ABNORMAL LOW (ref 12.0–15.0)
MCH: 28.1 pg (ref 26.0–34.0)
MCH: 28.4 pg (ref 26.0–34.0)
MCHC: 33.1 g/dL (ref 30.0–36.0)
MCHC: 33.6 g/dL (ref 30.0–36.0)
MCV: 84.7 fL (ref 78.0–100.0)
MCV: 84.5 fL (ref 78.0–100.0)
PLATELETS: 115 10*3/uL — AB (ref 150–400)
PLATELETS: 109 10*3/uL — AB (ref 150–400)
RBC: 3.92 MIL/uL (ref 3.87–5.11)
RBC: 3.94 MIL/uL (ref 3.87–5.11)
RDW: 15.3 % (ref 11.5–15.5)
RDW: 15.2 % (ref 11.5–15.5)
WBC: 6.9 10*3/uL (ref 4.0–10.5)
WBC: 6.8 10*3/uL (ref 4.0–10.5)

## 2015-12-01 LAB — BASIC METABOLIC PANEL
ANION GAP: 7 (ref 5–15)
BUN: 15 mg/dL (ref 6–20)
CALCIUM: 9.1 mg/dL (ref 8.9–10.3)
CO2: 25 mmol/L (ref 22–32)
Chloride: 106 mmol/L (ref 101–111)
Creatinine, Ser: 0.81 mg/dL (ref 0.44–1.00)
GLUCOSE: 112 mg/dL — AB (ref 65–99)
Potassium: 4 mmol/L (ref 3.5–5.1)
Sodium: 138 mmol/L (ref 135–145)

## 2015-12-01 LAB — ECHOCARDIOGRAM COMPLETE
HEIGHTINCHES: 66 in
Weight: 2112 oz

## 2015-12-01 LAB — PROTIME-INR
INR: 2 — AB (ref 0.00–1.49)
INR: 1.94 — ABNORMAL HIGH (ref 0.00–1.49)
PROTHROMBIN TIME: 22.6 s — AB (ref 11.6–15.2)
Prothrombin Time: 22 seconds — ABNORMAL HIGH (ref 11.6–15.2)

## 2015-12-01 LAB — T4, FREE: FREE T4: 1.8 ng/dL — AB (ref 0.61–1.12)

## 2015-12-01 LAB — TSH: TSH: 0.394 u[IU]/mL (ref 0.350–4.500)

## 2015-12-01 LAB — HEPARIN LEVEL (UNFRACTIONATED): HEPARIN UNFRACTIONATED: 0.24 [IU]/mL — AB (ref 0.30–0.70)

## 2015-12-01 SURGERY — FIXATION, FRACTURE, INTERTROCHANTERIC, WITH INTRAMEDULLARY ROD
Anesthesia: General | Site: Hip | Laterality: Left

## 2015-12-01 MED ORDER — BUPIVACAINE-EPINEPHRINE (PF) 0.5% -1:200000 IJ SOLN
INTRAMUSCULAR | Status: AC
  Filled 2015-12-01: qty 60

## 2015-12-01 MED ORDER — GLYCOPYRROLATE 0.2 MG/ML IJ SOLN
INTRAMUSCULAR | Status: DC | PRN
  Administered 2015-12-01: 0.6 mg via INTRAVENOUS

## 2015-12-01 MED ORDER — FENTANYL CITRATE (PF) 100 MCG/2ML IJ SOLN
INTRAMUSCULAR | Status: AC
  Filled 2015-12-01: qty 2

## 2015-12-01 MED ORDER — GLYCOPYRROLATE 0.2 MG/ML IJ SOLN
INTRAMUSCULAR | Status: AC
  Filled 2015-12-01: qty 3

## 2015-12-01 MED ORDER — ACETAMINOPHEN 10 MG/ML IV SOLN
1000.0000 mg | Freq: Four times a day (QID) | INTRAVENOUS | Status: AC
  Administered 2015-12-01 – 2015-12-02 (×4): 1000 mg via INTRAVENOUS
  Filled 2015-12-01 (×3): qty 100

## 2015-12-01 MED ORDER — NEOSTIGMINE METHYLSULFATE 10 MG/10ML IV SOLN
INTRAVENOUS | Status: DC | PRN
  Administered 2015-12-01: 1 mg via INTRAVENOUS
  Administered 2015-12-01: 4 mg via INTRAVENOUS

## 2015-12-01 MED ORDER — TRAMADOL HCL 50 MG PO TABS
50.0000 mg | ORAL_TABLET | Freq: Four times a day (QID) | ORAL | Status: DC
  Administered 2015-12-02: 50 mg via ORAL
  Filled 2015-12-01 (×2): qty 1

## 2015-12-01 MED ORDER — ETOMIDATE 2 MG/ML IV SOLN
INTRAVENOUS | Status: DC | PRN
  Administered 2015-12-01: 10 mg via INTRAVENOUS

## 2015-12-01 MED ORDER — BUPIVACAINE-EPINEPHRINE (PF) 0.5% -1:200000 IJ SOLN
INTRAMUSCULAR | Status: DC | PRN
  Administered 2015-12-01: 60 mL via PERINEURAL

## 2015-12-01 MED ORDER — ONDANSETRON HCL 4 MG/2ML IJ SOLN
4.0000 mg | Freq: Once | INTRAMUSCULAR | Status: AC
  Administered 2015-12-01: 4 mg via INTRAVENOUS

## 2015-12-01 MED ORDER — ONDANSETRON HCL 4 MG/2ML IJ SOLN
INTRAMUSCULAR | Status: AC
  Filled 2015-12-01: qty 2

## 2015-12-01 MED ORDER — CLINDAMYCIN PHOSPHATE 900 MG/50ML IV SOLN
900.0000 mg | INTRAVENOUS | Status: AC
  Administered 2015-12-01: 900 mg via INTRAVENOUS
  Filled 2015-12-01 (×2): qty 50

## 2015-12-01 MED ORDER — ACETAMINOPHEN 10 MG/ML IV SOLN
INTRAVENOUS | Status: AC
  Filled 2015-12-01: qty 100

## 2015-12-01 MED ORDER — FENTANYL CITRATE (PF) 100 MCG/2ML IJ SOLN
INTRAMUSCULAR | Status: DC | PRN
  Administered 2015-12-01 (×6): 25 ug via INTRAVENOUS

## 2015-12-01 MED ORDER — ROCURONIUM BROMIDE 100 MG/10ML IV SOLN
INTRAVENOUS | Status: DC | PRN
  Administered 2015-12-01: 45 mg via INTRAVENOUS

## 2015-12-01 MED ORDER — FENTANYL CITRATE (PF) 100 MCG/2ML IJ SOLN
INTRAMUSCULAR | Status: AC
Start: 2015-12-01 — End: 2015-12-01
  Filled 2015-12-01: qty 2

## 2015-12-01 MED ORDER — LACTATED RINGERS IV SOLN
INTRAVENOUS | Status: DC
  Administered 2015-12-01: 1000 mL via INTRAVENOUS

## 2015-12-01 MED ORDER — SODIUM CHLORIDE 0.9 % IR SOLN
Status: DC | PRN
  Administered 2015-12-01: 1000 mL

## 2015-12-01 MED ORDER — SODIUM CHLORIDE 0.9 % IV SOLN
INTRAVENOUS | Status: DC | PRN
  Administered 2015-12-01: 16:00:00 via INTRAVENOUS

## 2015-12-01 MED ORDER — SODIUM CHLORIDE 0.9 % IV SOLN
INTRAVENOUS | Status: DC
  Administered 2015-12-01: 20:00:00 via INTRAVENOUS

## 2015-12-01 MED ORDER — MIDAZOLAM HCL 2 MG/2ML IJ SOLN
INTRAMUSCULAR | Status: AC
  Filled 2015-12-01: qty 2

## 2015-12-01 MED ORDER — POVIDONE-IODINE 10 % EX SWAB: 2.0000 "application " | Freq: Once | CUTANEOUS | Status: DC

## 2015-12-01 MED ORDER — FENTANYL CITRATE (PF) 100 MCG/2ML IJ SOLN: 25.0000 ug | INTRAMUSCULAR | Status: DC | PRN

## 2015-12-01 MED ORDER — CHLORHEXIDINE GLUCONATE 4 % EX LIQD
60.0000 mL | Freq: Once | CUTANEOUS | Status: AC
  Administered 2015-12-01: 4 via TOPICAL
  Filled 2015-12-01: qty 15

## 2015-12-01 MED ORDER — ONDANSETRON HCL 4 MG/2ML IJ SOLN: 4.0000 mg | Freq: Once | INTRAMUSCULAR | Status: DC | PRN

## 2015-12-01 MED ORDER — MIDAZOLAM HCL 2 MG/2ML IJ SOLN
1.0000 mg | INTRAMUSCULAR | Status: DC | PRN
  Administered 2015-12-01: 2 mg via INTRAVENOUS

## 2015-12-01 MED ORDER — ETOMIDATE 2 MG/ML IV SOLN
INTRAVENOUS | Status: AC
  Filled 2015-12-01: qty 10

## 2015-12-01 SURGICAL SUPPLY — 53 items
BAG HAMPER (MISCELLANEOUS) ×3 IMPLANT
BIT DRILL AO GAMMA 4.2X300 (BIT) ×3 IMPLANT
BLADE HEX COATED 2.75 (ELECTRODE) ×3 IMPLANT
BLADE SURG SZ10 CARB STEEL (BLADE) ×6 IMPLANT
BNDG GAUZE ELAST 4 BULKY (GAUZE/BANDAGES/DRESSINGS) ×3 IMPLANT
CHLORAPREP W/TINT 26ML (MISCELLANEOUS) ×3 IMPLANT
CLOTH BEACON ORANGE TIMEOUT ST (SAFETY) ×3 IMPLANT
COVER LIGHT HANDLE STERIS (MISCELLANEOUS) ×6 IMPLANT
COVER MAYO STAND XLG (DRAPE) ×3 IMPLANT
DECANTER SPIKE VIAL GLASS SM (MISCELLANEOUS) ×6 IMPLANT
DRAPE STERI IOBAN 125X83 (DRAPES) ×3 IMPLANT
DRSG AQUACEL AG ADV 3.5X10 (GAUZE/BANDAGES/DRESSINGS) ×3 IMPLANT
DRSG MEPILEX BORDER 4X12 (GAUZE/BANDAGES/DRESSINGS) ×3 IMPLANT
ELECT REM PT RETURN 9FT ADLT (ELECTROSURGICAL) ×3
ELECTRODE REM PT RTRN 9FT ADLT (ELECTROSURGICAL) ×1 IMPLANT
GLOVE BIOGEL PI IND STRL 7.0 (GLOVE) ×1 IMPLANT
GLOVE BIOGEL PI INDICATOR 7.0 (GLOVE) ×2
GLOVE SKINSENSE NS SZ8.0 LF (GLOVE) ×2
GLOVE SKINSENSE STRL SZ8.0 LF (GLOVE) ×1 IMPLANT
GLOVE SS N UNI LF 8.5 STRL (GLOVE) ×3 IMPLANT
GOWN STRL REUS W/TWL LRG LVL3 (GOWN DISPOSABLE) ×9 IMPLANT
GOWN STRL REUS W/TWL XL LVL3 (GOWN DISPOSABLE) ×3 IMPLANT
GUIDEROD T2 3X1000 (ROD) ×3 IMPLANT
INST SET MAJOR BONE (KITS) ×3 IMPLANT
K-WIRE  3.2X450M STR (WIRE) ×2
K-WIRE 3.2X450M STR (WIRE) ×1
KIT BLADEGUARD II DBL (SET/KITS/TRAYS/PACK) ×3 IMPLANT
KIT ROOM TURNOVER AP CYSTO (KITS) ×3 IMPLANT
KWIRE 3.2X450M STR (WIRE) ×1 IMPLANT
MANIFOLD NEPTUNE II (INSTRUMENTS) ×3 IMPLANT
MARKER SKIN DUAL TIP RULER LAB (MISCELLANEOUS) ×3 IMPLANT
NAIL TROCH GAMMA 11X18 (Nail) ×3 IMPLANT
NEEDLE HYPO 21X1.5 SAFETY (NEEDLE) ×3 IMPLANT
NEEDLE SPNL 18GX3.5 QUINCKE PK (NEEDLE) ×3 IMPLANT
NS IRRIG 1000ML POUR BTL (IV SOLUTION) ×3 IMPLANT
PACK BASIC III (CUSTOM PROCEDURE TRAY) ×2
PACK SRG BSC III STRL LF ECLPS (CUSTOM PROCEDURE TRAY) ×1 IMPLANT
PENCIL HANDSWITCHING (ELECTRODE) ×3 IMPLANT
SCREW LAG GAMMA 3 TI 10.5X105M (Screw) ×3 IMPLANT
SCREW LOCKING T2 F/T  5X37.5MM (Screw) ×2 IMPLANT
SCREW LOCKING T2 F/T 5X37.5MM (Screw) ×1 IMPLANT
SET BASIN LINEN APH (SET/KITS/TRAYS/PACK) ×3 IMPLANT
SLING ARM FOAM STRAP LRG (SOFTGOODS) IMPLANT
SLING ARM FOAM STRAP MED (SOFTGOODS) IMPLANT
SLING ARM FOAM STRAP XLG (SOFTGOODS) ×3 IMPLANT
SPONGE LAP 18X18 X RAY DECT (DISPOSABLE) ×6 IMPLANT
STAPLER VISISTAT 35W (STAPLE) ×3 IMPLANT
SUT MNCRL 0 VIOLET CTX 36 (SUTURE) ×2 IMPLANT
SUT MON AB 2-0 CT1 36 (SUTURE) ×3 IMPLANT
SUT MONOCRYL 0 CTX 36 (SUTURE) ×4
SYR 30ML LL (SYRINGE) ×3 IMPLANT
SYR BULB IRRIGATION 50ML (SYRINGE) ×6 IMPLANT
YANKAUER SUCT BULB TIP NO VENT (SUCTIONS) ×3 IMPLANT

## 2015-12-01 NOTE — Care Management Important Message (Signed)
Important Message  Patient Details  Name: Courtney [name] MRN: [id number] Date of Birth: Nov 05, 1928   Medicare Important Message Given:  Yes    Alvie Heidelberg, RN 12/01/2015, 9:19 AM

## 2015-12-01 NOTE — Progress Notes (Signed)
PROGRESS NOTE    Courtney [name]  [id number] DOB: 10-Dec-1928 DOA: 11/29/2015 PCP: No primary care provider on file. M.D. in [place]. Outpatient Specialists:     Brief Narrative:   Patient is an 82 year old woman with a history of mitral valve replacement on Coumadin, chronic atrial fibrillation, vascular dementia from a previous stroke, hypothyroidism, and hypertension. She presented to the ED on 11/29/2015 after falling at home. She complained of left hip pain. In the ED, she was afebrile and hemodynamically stable. Her lab data were significant for BUN of 24, creatinine of 1.01, and INR of 2.3. Pelvic and hip x-ray revealed an acute slightly angulated intertrochanteric fracture of the left hip with no pelvic fracture observed. Her chest x-ray revealed no acute findings. She was admitted for evaluation and management of acute left hip fracture.  Assessment & Plan:   Principal Problem:   Closed fracture of intertrochanteric section of femur [facility]) Active Problems:   Chronic anticoagulation   H/O mitral valve replacement   Fall at home   Chronic atrial fibrillation ([place])   Dehydration   Vascular dementia   Essential hypertension   Thrombocytopenia ([facility])   Hypothyroidism   Biatrial enlargement   Pulmonary hypertension, moderate to severe ([place])   1. Fall resulting in acute intratrochanteric fracture of the left hip. Patient was started on analgesics as needed for pain. Orthopedic surgeon, Dr. Aline Brochure was consulted. He requested a cardiology consult regarding whether the patient's chronic anticoagulation should be bridged or not. In the meantime, Coumadin was withheld and she was started on a heparin drip. Cardiologist, Dr. Jacinta Shoe agreed with perioperative heparin and deferred restarting Coumadin when feasible by orthopedic surgery. -Operative repair with intramedullary nail intratrochanteric left hip fracture, performed 12/01/15. -Postoperative recommendations regarding  anticoagulation, physical therapy, and analgesics, will be deferred, in part, to Dr. Aline Brochure.  Chronic anticoagulation secondary to mitral valve replacement and chronic atrial fibrillation. Patient is treated chronically with Coumadin for anticoagulation and atenolol for rate control. -As above, Coumadin was withheld and she is being bridged with heparin. Atenolol was restarted. Her rate has been controlled. -Cardiology opined and ordered a 2-D echocardiogram to evaluate her LV function and mitral valve prosthesis. Of note, an old nuclear stress test was reviewed by cardiology and it showed myocardial scar, no ischemia, indicative of CAD. -Echo revealed an EF of 60-65%, normal functioning mechanical mitral valve; severe left atrial and massive right atrial enlargement with severe pulmonary hypertension. -Restart Coumadin when feasible by orthopedic surgery.  Severe biatrial enlargement and severe pulmonary hypertension with preserved left ventricle systolic function. -Noted per echo.  Thrombocytopenia. Patient's platelet count was within normal limits on admission. It fills significantly to 115 and then to 109 following the start of heparin. HIT panel ordered.  Hypertension. Patient is treated chronically with amlodipine, lisinopril, Dyazide, and atenolol. On admission, her blood pressure was on the lower side of normal, so Dyazide was withheld. Her blood pressure is trending up , but still not malignant. Will adjust doses of medications if needed following surgery.  Mild dehydration. On admission, patient's BUN was 25 and her creatinine was 1.01. It was felt that she was mildly dehydrated from the fallen from Dyazide. She was started on gentle IV fluids and Dyazide was withheld. Her BUN and creatinine have improved. -We will adjust IV fluids accordingly in the perioperative period.  Hypothyroidism. Patient was continued on Synthroid. TSH was within normal limits at 0.39 and free T4 was  slightly elevated at 1.8. -We'll continue current dose. Patient  will need follow-up TSH and free T4 in 3-6 months for reevaluation.   DVT prophylaxis: Coumadin>>> heparin Code Status: full code Family Communication: Family not available, discussed with patient. Disposition Plan: Likely discharge to skilled nursing facility when clinically appropriate.   Consultants:   Cardiology  Orthopedic surgery  Procedures:  1. Left hip operative repair, 12/01/15  2. Echo 11/30/2015: - Left ventricle: The cavity size was normal. Wall thickness was  increased in a pattern of mild LVH. Systolic function was normal.  The estimated ejection fraction was in the range of 60% to 65%.  Wall motion was normal; there were no regional wall motion  abnormalities. The study was not technically sufficient to allow  evaluation of LV diastolic dysfunction due to atrial  fibrillation. - Aortic valve: Mildly to moderately calcified annulus. Trileaflet;  mildly thickened, mildly calcified leaflets. Possibly very mild  aortic stenosis. There was mild regurgitation. - Mitral valve: [employer] mechanical mitral valve noted, which  appears to be functioning normally. - Left atrium: The atrium was severely dilated. - Right ventricle: The cavity size was mildly dilated. - Right atrium: The atrium was massively dilated. - Atrial septum: No defect or patent foramen ovale was identified. - Tricuspid valve: Mildly thickened leaflets. There was  moderate-severe regurgitation. - Pulmonary arteries: Systolic pressure was severely increased. PA  peak pressure: 65 mm Hg (S). - Inferior vena cava: The vessel was dilated. The respirophasic  diameter changes were blunted (< 50%), consistent with elevated   central venous pressure.  Antimicrobials:  None    Subjective: (Patient was seen earlier this afternoon-delayed entry-patient is lying in bed and has no complaints of pain, shortness of breath, or bloody  stools) sees.  Objective: Filed Vitals:   12/01/15 1440 12/01/15 1445 12/01/15 1450 12/01/15 1455  BP: 146/89 146/89 148/70 148/70  Pulse:      Temp:      TempSrc:      Resp: 18 18 20 19   Height:      Weight:      SpO2: 100% 97% 99% 99%    Intake/Output Summary (Last 24 hours) at 12/01/15 1707 Last data filed at 12/01/15 1612  Gross per 24 hour  Intake 3138.33 ml  Output   2150 ml  Net 988.33 ml   Filed Weights   11/29/15 0819 12/01/15 1412  Weight: 59.875 kg (132 lb) 59.875 kg (132 lb)    Examination:  General exam: Appears calm and comfortable; No acute distress. Respiratory system: Clear to auscultation. Respiratory effort normal. Cardiovascular system: Irregular, irregular, with systolic click. RRR.  No pedal edema. Gastrointestinal system: Abdomen is nondistended, soft and nontender. No organomegaly or masses felt. Normal bowel sounds heard. Central nervous system: Alert and oriented to herself, and hospital, but she is not oriented to the name of the hospital, time, or year. She follows commands. Her speech is clear. No focal neurological deficits. Extremities: Shortening of her left leg; no erythema, but mild to moderate tenderness palpated over the left hip area; no pedal edema.  Skin: No rashes, lesions or ulcers Psychiatry: Pleasant affect. Speech is clear.     Data Reviewed: I have personally reviewed following labs and imaging studies  CBC:  Recent Labs Lab 11/29/15 0909 11/30/15 0525 12/01/15 0600 12/01/15 0810  WBC 7.8 7.1 6.9 6.8  NEUTROABS 6.6  --   --   --   HGB 12.5 11.5* 11.0* 11.2*  HCT 38.6 34.4* 33.2* 33.3*  MCV 85.6 84.7 84.7 84.5  PLT 168  150 115* 0000000*   Basic Metabolic Panel:  Recent Labs Lab 11/29/15 0909 11/30/15 0525 12/01/15 0600  NA 138 138 138  K 3.7 3.9 4.0  CL 101 105 106  CO2 29 24 25   GLUCOSE 92 102* 112*  BUN 24* 22* 15  CREATININE 1.01* 0.93 0.81  CALCIUM 10.0 9.1 9.1   GFR: Estimated Creatinine Clearance:  47.5 mL/min (by C-G formula based on Cr of 0.81). Liver Function Tests:  Recent Labs Lab 11/30/15 0525  AST 20  ALT 15  ALKPHOS 52  BILITOT 1.2  PROT 5.8*  ALBUMIN 3.7   No results for input(s): LIPASE, AMYLASE in the last 168 hours. No results for input(s): AMMONIA in the last 168 hours. Coagulation Profile:  Recent Labs Lab 11/29/15 0909 11/30/15 0040 11/30/15 1416 12/01/15 0810 12/01/15 1217  INR 2.30* 2.62* 2.76* 2.00* 1.94*   Cardiac Enzymes: No results for input(s): CKTOTAL, CKMB, CKMBINDEX, TROPONINI in the last 168 hours. BNP (last 3 results) No results for input(s): PROBNP in the last 8760 hours. HbA1C: No results for input(s): HGBA1C in the last 72 hours. CBG: No results for input(s): GLUCAP in the last 168 hours. Lipid Profile: No results for input(s): CHOL, HDL, LDLCALC, TRIG, CHOLHDL, LDLDIRECT in the last 72 hours. Thyroid Function Tests:  Recent Labs  12/01/15 0600  TSH 0.394  FREET4 1.80*   Anemia Panel: No results for input(s): VITAMINB12, FOLATE, FERRITIN, TIBC, IRON, RETICCTPCT in the last 72 hours. Urine analysis:    Component Value Date/Time   COLORURINE YELLOW 07/12/2015 1345   APPEARANCEUR HAZY* 07/12/2015 1345   LABSPEC 1.020 07/12/2015 1345   PHURINE 6.0 07/12/2015 1345   GLUCOSEU NEGATIVE 07/12/2015 1345   HGBUR LARGE* 07/12/2015 1345   BILIRUBINUR NEGATIVE 07/12/2015 1345   KETONESUR TRACE* 07/12/2015 1345   PROTEINUR 100* 07/12/2015 1345   NITRITE POSITIVE* 07/12/2015 1345   LEUKOCYTESUR MODERATE* 07/12/2015 1345   Sepsis Labs: @LABRCNTIP (procalcitonin:4,lacticidven:4)  ) Recent Results (from the past 240 hour(s))  Surgical pcr screen     Status: Abnormal   Collection Time: 11/30/15  9:08 AM  Result Value Ref Range Status   MRSA, PCR NEGATIVE NEGATIVE Final   Staphylococcus aureus POSITIVE (A) NEGATIVE Final    Comment:        The Xpert SA Assay (FDA approved for NASAL specimens in patients over 21 years of  age), is one component of a comprehensive surveillance program.  Test performance has been validated by [facility] for patients greater than or equal to 61 year old. It is not intended to diagnose infection nor to guide or monitor treatment. RESULT CALLED TO, READ BACK BY AND VERIFIED WITH: MAYS,J. AT 1304 ON 11/30/2015 BY BAUGHAM,M.          Radiology Studies: No results found.      Scheduled Meds: . [MAR Hold] amLODipine  2.5 mg Oral Daily  . [MAR Hold] atenolol  12.5 mg Oral Daily  . [MAR Hold] Chlorhexidine Gluconate Cloth  6 each Topical Daily  . [MAR Hold] docusate sodium  100 mg Oral BID  . [MAR Hold] levothyroxine  125 mcg Oral QAC breakfast  . [MAR Hold] lisinopril  2.5 mg Oral Daily  . [MAR Hold] mupirocin ointment  1 application Nasal BID  . povidone-iodine  2 application Topical Once  . traMADol  50 mg Oral Q6H  . [MAR Hold] vitamin C  500 mg Oral BID   Continuous Infusions: . 0.9 % NaCl with KCl 20 mEq / L 50  mL/hr at 11/30/15 1808  . lactated ringers Stopped (12/01/15 1605)     LOS: 2 days    Time spent: 17 minutes    Rexene Alberts, MD [facility] Pager [phone]  If 7PM-7AM, please contact night-coverage [email or web address] Password [facility] 12/01/2015, 5:07 PM

## 2015-12-01 NOTE — Clinical Social Work Placement (Signed)
   CLINICAL SOCIAL WORK PLACEMENT  NOTE  Date:  12/01/2015  Patient Details  Name: Courtney [name] MRN: [id number] Date of Birth: November 22, 1928  Clinical Social Work is seeking post-discharge placement for this patient at the [place] level of care (*CSW will initial, date and re-position this form in  chart as items are completed):  Yes   Patient/family provided with [place] Work Department's list of facilities offering this level of care within the geographic area requested by the patient (or if unable, by the patient's family).  Yes   Patient/family informed of their freedom to choose among providers that offer the needed level of care, that participate in Medicare, Medicaid or managed care program needed by the patient, have an available bed and are willing to accept the patient.  Yes   Patient/family informed of [facility]'s ownership interest in [facility] and [facility], as well as of the fact that they are under no obligation to receive care at these facilities.  PASRR submitted to EDS on 11/30/15     PASRR number received on 11/30/15     Existing PASRR number confirmed on       FL2 transmitted to all facilities in geographic area requested by pt/family on 11/30/15     FL2 transmitted to all facilities within larger geographic area on       Patient informed that his/her managed care company has contracts with or will negotiate with certain facilities, including the following:        Yes   Patient/family informed of bed offers received.  Patient chooses bed at [facility]     Physician recommends and patient chooses bed at      Patient to be transferred to [facility] on  .  Patient to be transferred to facility by       Patient family notified on   of transfer.  Name of family member notified:        PHYSICIAN       Additional Comment:    _______________________________________________ Salome Arnt, [place] 12/01/2015, 8:31 AM [phone]

## 2015-12-01 NOTE — Progress Notes (Signed)
Subjective: Hip pain    Objective: Vital signs in last 24 hours: Temp:  [98.1 F (36.7 C)-98.5 F (36.9 C)] 98.5 F (36.9 C) (04/28 0622) Pulse Rate:  [68-74] 74 (04/28 0622) Resp:  [18-19] 18 (04/28 0622) BP: (122-133)/(66-82) 122/67 mmHg (04/28 0622) SpO2:  [97 %-98 %] 97 % (04/28 0622)  Intake/Output from previous day: 04/27 0701 - 04/28 0700 In: 2538.3 [I.V.:2538.3] Out: 2525 [Urine:2525] Intake/Output this shift:     Recent Labs  11/29/15 0909 11/30/15 0525 12/01/15 0600  HGB 12.5 11.5* 11.0*    Recent Labs  11/30/15 0525 12/01/15 0600  WBC 7.1 6.9  RBC 4.06 3.92  HCT 34.4* 33.2*  PLT 150 115*    Recent Labs  11/30/15 0525 12/01/15 0600  NA 138 138  K 3.9 4.0  CL 105 106  CO2 24 25  BUN 22* 15  CREATININE 0.93 0.81  GLUCOSE 102* 112*  CALCIUM 9.1 9.1    Recent Labs  11/30/15 0040 11/30/15 1416  INR 2.62* 2.76*   Confused ABD soft Neurovascular intact Sensation intact distally Intact pulses distally Dorsiflexion/Plantar flexion intact Need a PT/INR  Assessment/Plan:  Echo results pending I turned the heparin off at 8 am !    Arther Abbott 12/01/2015, 8:12 AM

## 2015-12-01 NOTE — Anesthesia Procedure Notes (Addendum)
Procedure Name: Intubation Date/Time: 12/01/2015 3:12 PM Performed by: Andree Elk, Lasonja Lakins A Pre-anesthesia Checklist: Patient identified, Patient being monitored, Timeout performed, Emergency Drugs available and Suction available Patient Re-evaluated:Patient Re-evaluated prior to inductionOxygen Delivery Method: Circle System Utilized Preoxygenation: Pre-oxygenation with 100% oxygen Intubation Type: IV induction Ventilation: Mask ventilation without difficulty Laryngoscope Size: 3 and Miller Grade View: Grade I Tube type: Oral Tube size: 7.0 mm Number of attempts: 1 Airway Equipment and Method: Stylet Placement Confirmation: ETT inserted through vocal cords under direct vision,  positive ETCO2 and breath sounds checked- equal and bilateral Secured at: 21 cm Tube secured with: Tape Dental Injury: Teeth and Oropharynx as per pre-operative assessment

## 2015-12-01 NOTE — Anesthesia Preprocedure Evaluation (Signed)
Anesthesia Evaluation  Patient identified by MRN, date of birth, ID band Patient awake    Reviewed: Allergy & Precautions, NPO status , Patient's Chart, lab work & pertinent test results, reviewed documented beta blocker date and time   Airway Mallampati: II  TM Distance: >3 FB     Dental  (+) Edentulous Upper, Partial Lower   Pulmonary  pulm HTN    breath sounds clear to auscultation       Cardiovascular Exercise Tolerance: Good hypertension, Pt. on medications and Pt. on home beta blockers + dysrhythmias Atrial Fibrillation + Valvular Problems/Murmurs (s/p MVR)  Rhythm:Irregular Rate:Normal  Pt has been ambulatory and active.   Neuro/Psych PSYCHIATRIC DISORDERS (mild dementia ) CVA    GI/Hepatic   Endo/Other  Hypothyroidism   Renal/GU      Musculoskeletal   Abdominal   Peds  Hematology   Anesthesia Other Findings Heparin stopped at 0800.  INR still elevated 1.94   Reproductive/Obstetrics                             Anesthesia Physical Anesthesia Plan  ASA: III  Anesthesia Plan: General   Post-op Pain Management:    Induction: Intravenous  Airway Management Planned: Oral ETT  Additional Equipment:   Intra-op Plan:   Post-operative Plan: Extubation in OR  Informed Consent: I have reviewed the patients History and Physical, chart, labs and discussed the procedure including the risks, benefits and alternatives for the proposed anesthesia with the patient or authorized representative who has indicated his/her understanding and acceptance.     Plan Discussed with:   Anesthesia Plan Comments:         Anesthesia Quick Evaluation

## 2015-12-01 NOTE — Progress Notes (Signed)
Echo reviewed. Normal systolic function, EF [id number] with severe left atrial and massive right atrial enlargement with severe pulmonary hypertension. Mechanical mitral valve is functioning normally.  No changes to medical management in the perioperative period.

## 2015-12-01 NOTE — Progress Notes (Signed)
Heparin drip discontinued per Dr Ruthe Mannan orders. Will continue to monitor patient.

## 2015-12-01 NOTE — Transfer of Care (Signed)
Immediate Anesthesia Transfer of Care Note  Patient: Courtney [name]  Procedure(s) Performed: Procedure(s): INTRAMEDULLARY (IM) NAIL INTERTROCHANTRIC (Left)  Patient Location: PACU  Anesthesia Type:General  Level of Consciousness: sedated and patient cooperative  Airway & Oxygen Therapy: Patient Spontanous Breathing and Patient connected to face mask oxygen  Post-op Assessment: Report given to RN and Post -op Vital signs reviewed and stable  Post vital signs: Reviewed and stable  Last Vitals:  Filed Vitals:   12/01/15 1450 12/01/15 1455  BP: 148/70 148/70  Pulse:    Temp:    Resp: 20 19    Last Pain:  Filed Vitals:   12/01/15 1455  PainSc: 3       Patients Stated Pain Goal: 7 ([id number])  Complications: No apparent anesthesia complications

## 2015-12-01 NOTE — Care Management Note (Signed)
Case Management Note  Patient Details  Name: Jalene Lawrie MRN: [id number] Date of Birth: September 30, 1928  Subjective/Objective:    Spoke with patient whois alert but pleasantly confused and forgetful.   Stated lives here in [place] with her son . From [facility] originally. Pateint here with fractuered hip that will require surgical intervention anticipate discharge to SNF.              Action/Plan: Discharge to SNF   Expected Discharge Date:  12/06/15               Expected Discharge Plan:     In-House Referral:  Clinical Social Work  Discharge planning Services  CM Consult  Post Acute Care Choice:    Choice offered to:     DME Arranged:    DME Agency:     HH Arranged:    HH Agency:     Status of Service:  In process, will continue to follow  Medicare Important Message Given:  Yes Date Medicare IM Given:    Medicare IM give by:    Date Additional Medicare IM Given:    Additional Medicare Important Message give by:     If discussed at [place] of Stay Meetings, dates discussed:    Additional Comments:  Alvie Heidelberg, RN 12/01/2015, 12:06 PM

## 2015-12-01 NOTE — Progress Notes (Signed)
Verified that 3 units of PC available from blood bank

## 2015-12-01 NOTE — Op Note (Signed)
Surgical dictation for 12/01/2015  .Courtney [name]  Dictating procedure intramedullary nailing left intertrochanteric hip fracture with gamma nail  Preop diagnosis stable left intertrochanteric fracture of the hip  Postop diagnosis same  Procedure open treatment internal fixation left hip with gamma nail  Implants size 105 mm lag screw, short 125 and the nail, acorn in sliding mode, 37.5 mm distal locking screw  Surgeon Aline Brochure  Assisted by Simonne Maffucci  Gen. anesthesia  1 unit of blood was given  Estimated blood loss 350 mL  Brief history this is an 82 year old female on Plavix and Coumadin history of a mitral valve mild dementia but in overall active person even at 89. She injured her left hip sustained a fracture. Rule out time for the Plavix and warfarin to wear off and then took her to surgery today. Cardiology consult was obtained prior to surgery. Patient will need heparin bridge back to Coumadin  Details of procedure:  The surgical site was marked. The chart was reviewed. She was taken to surgery for general anesthesia she was then placed on the fracture table. Her right leg was flexed and abduction and externally rotated and placed in a padded leg holder the left leg was placed in traction.  The C-arm was brought in and gentle traction and internal rotation were used to reduce the fracture. This was confirmed by AP lateral and oblique x-rays  Leg was prepped and draped sterilely timeout was completed  A proximal incision was made at the greater trochanter extended proximally down to fascia. Extra care was taken to control any subcutaneous bleeding due to the history of Plavix and warfarin. The fascia was split in line  with the skin incision and the fracture hematoma was evacuated. The greater trochanter was identified and the curved awl was passed. AP lateral x-ray confirmed placement of the all in good position  Guidewire was passed through the cannulated awl nail was  removed.  X-rays confirmed guidewire placement  125 nail was passed over the guidewire guidewire was removed  Second proximal incision was made to pass the lag screw cannula.   perforating drill was used to breach the cortex and then a guidewire was placed in the center center position on AP and lateral films. We did noted this time that the fracture had to be remanipulated as fracture reduction was lost. AP and lateral x-rays allowed [place] to reduce the fracture and proceed  Once the guidewire was in place we measured 205 mm over reamed it with a reamer and then past the lag screw with good purchase  X-rays confirm lag screw position by taken AP and lateral films  Irrigated the proximal portion of the nail and passed the acorn in reverse the screwdriver quarter turn per manufacture technique to allow sliding of the nail. We did use compression using the compression we are prior to acorn placement  Once this was in satisfactory position on x-ray we made a third incision past the distal locking screw cannula and then drilled and passed a 37.5 mm distal locking screw  We irrigated all wounds closed the proximal wound with 2 layers of 0 Monocryl suture we injected 30 mL of Marcaine to deep and then superficially this was Marcaine with epinephrine  We stapled all incisions closed  The postoperative plan  Weightbearing as tolerated Heparin bridge back to warfarin we can proceed as there was no spinal  Appropriate monitoring of hemoglobin  Staples out at 2 weeks   X-rays at 2 weeks, 6 weeks  and 12 weeks

## 2015-12-01 NOTE — Brief Op Note (Signed)
11/29/2015 - 12/01/2015  4:49 PM  PATIENT:  [name]  82 y.o. female  PRE-OPERATIVE DIAGNOSIS:  INTERTROCHANTERIC FRACTURE LEFT HIP  POST-OPERATIVE DIAGNOSIS:  INTERTROCHANTERIC FRACTURE LEFT HIP  PROCEDURE:  Procedure(s): INTRAMEDULLARY (IM) NAIL INTERTROCHANTRIC (Left) hip fracture  We used a short gamma nail with 105 mm lag screw an acorn in sliding mode and a distal 37.5 locking screw  This was a two-part fracture posterior medial buttress intact  SURGEON:  Surgeon(s) and Role:    * Carole Civil, MD - Primary  PHYSICIAN ASSISTANT:   ASSISTANTS: Simonne Maffucci assisted  We used general anesthesia  1 unit of blood was given  No drains  We did use Marcaine with epinephrine 60 mL there were no specimens   DISPOSITION OF SPECIMEN:  N/A  COUNTS:  YES  TOURNIQUET:  * No tourniquets in log *  DICTATION: .Dragon Dictation  PLAN OF CARE: Admit to inpatient   PATIENT DISPOSITION:  PACU - hemodynamically stable.   Delay start of Pharmacological VTE agent (>24hrs) due to surgical blood loss or risk of bleeding: no

## 2015-12-02 LAB — HEPARIN INDUCED PLATELET AB (HIT ANTIBODY): Heparin Induced Plt Ab: 0.288 OD (ref 0.000–0.400)

## 2015-12-02 LAB — CBC WITH DIFFERENTIAL/PLATELET
BASOS PCT: 0 %
Basophils Absolute: 0 10*3/uL (ref 0.0–0.1)
Eosinophils Absolute: 0.3 10*3/uL (ref 0.0–0.7)
Eosinophils Relative: 4 %
HEMATOCRIT: 32.5 % — AB (ref 36.0–46.0)
Hemoglobin: 10.6 g/dL — ABNORMAL LOW (ref 12.0–15.0)
Lymphocytes Relative: 9 %
Lymphs Abs: 0.7 10*3/uL (ref 0.7–4.0)
MCH: 27.9 pg (ref 26.0–34.0)
MCHC: 32.6 g/dL (ref 30.0–36.0)
MCV: 85.5 fL (ref 78.0–100.0)
MONOS PCT: 13 %
Monocytes Absolute: 1 10*3/uL (ref 0.1–1.0)
NEUTROS ABS: 5.4 10*3/uL (ref 1.7–7.7)
NEUTROS PCT: 74 %
Platelets: 111 10*3/uL — ABNORMAL LOW (ref 150–400)
RBC: 3.8 MIL/uL — AB (ref 3.87–5.11)
RDW: 15.6 % — ABNORMAL HIGH (ref 11.5–15.5)
WBC: 7.3 10*3/uL (ref 4.0–10.5)

## 2015-12-02 LAB — BASIC METABOLIC PANEL
ANION GAP: 5 (ref 5–15)
BUN: 15 mg/dL (ref 6–20)
CALCIUM: 8.9 mg/dL (ref 8.9–10.3)
CHLORIDE: 104 mmol/L (ref 101–111)
CO2: 26 mmol/L (ref 22–32)
Creatinine, Ser: 0.81 mg/dL (ref 0.44–1.00)
GFR calc non Af Amer: >60 mL/min (ref >60)
Glucose, Bld: 99 mg/dL (ref 65–99)
Potassium: 4.2 mmol/L (ref 3.5–5.1)
Sodium: 135 mmol/L (ref 135–145)

## 2015-12-02 LAB — PROTIME-INR
INR: 2.15 — ABNORMAL HIGH (ref 0.00–1.49)
PROTHROMBIN TIME: 23.8 s — AB (ref 11.6–15.2)

## 2015-12-02 MED ORDER — ACETAMINOPHEN 10 MG/ML IV SOLN
INTRAVENOUS | Status: AC
  Filled 2015-12-02: qty 200

## 2015-12-02 MED ORDER — WARFARIN SODIUM 2 MG PO TABS
4.0000 mg | ORAL_TABLET | Freq: Once | ORAL | Status: AC
  Administered 2015-12-02: 4 mg via ORAL
  Filled 2015-12-02: qty 2

## 2015-12-02 MED ORDER — WARFARIN - PHARMACIST DOSING INPATIENT
Freq: Every day | Status: DC
  Administered 2015-12-03: 1
  Administered 2015-12-04: 18:00:00

## 2015-12-02 NOTE — Progress Notes (Signed)
Patient ID: Courtney [name], female   DOB: 04-30-1929, 82 y.o.   MRN: [id number]  Postop day #1 status post intramedullary nailing left intertrochanteric fracture of the hip  Patient is doing very well she is alert she's awake. She doesn't look like she's had any surgery.   BP 124/57 mmHg  Pulse 78  Temp(Src) 98.6 F (37 C) (Oral)  Resp 20  Ht 5\' 6"  (1.676 m)  Wt 150 lb 12.7 oz (68.4 kg)  BMI 24.35 kg/m2  SpO2 100%  Her dressing is dry. Her limb alignment is normal. She has some ecchymosis around the thigh and lower leg. She was on Plavix and warfarin and needs to be bridged back over to both medications which can begin today.  She moves her foot normally she has good perfusion to the foot she has chronic discoloration of her lower legs.  Start physical therapy today Discharge plan the patient wishes to go to the [facility] and then rehabilitation in [facility]    Component Value Date/Time   NA 135 12/02/2015 0628   K 4.2 12/02/2015 0628   CL 104 12/02/2015 0628   CO2 26 12/02/2015 0628   GLUCOSE 99 12/02/2015 0628   BUN 15 12/02/2015 0628   CREATININE 0.81 12/02/2015 0628   CALCIUM 8.9 12/02/2015 0628   GFRNONAA >60 12/02/2015 0628   GFRAA >60 12/02/2015 0628    CBC Latest Ref Rng 12/02/2015 12/01/2015 12/01/2015  WBC 4.0 - 10.5 K/uL 7.3 6.8 6.9  Hemoglobin 12.0 - 15.0 g/dL 10.6(L) 11.2(L) 11.0(L)  Hematocrit 36.0 - 46.0 % 32.5(L) 33.3(L) 33.2(L)  Platelets 150 - 400 K/uL 111(L) 109(L) 115(L)

## 2015-12-02 NOTE — Evaluation (Signed)
Physical Therapy Evaluation Patient Details Name: Courtney [name] MRN: [id number] DOB: 1928/08/18 Today's Date: 12/02/2015   History of Present Illness  Courtney [name] is an 82yo white female who sustained a fall at home with immediate pain, and came to [facility] found to have L hip fracture. Pt presents today at POD1 s/p IM nailing. At baseline, pt is an active community dweller, fully independent.   Clinical Impression  Pt motivated to complete all therex in bed, although with poor pain tolerance, very limited range and requiring moderate to max physical assist to perform. Performance of basic bed mobility and transfers is very limited, requiring heavy verbal cuing and heavy physical assistance. Pt demonstrating impairment of strength, balance, and activity tolerance, limiting indep in ADL and functional mobility within the home and community. Pt will benefit from skilled PT intervention to address the above deficits in order to restore patient to PLOF and improve safety for return to home.       Follow Up Recommendations SNF    Equipment Recommendations  None recommended by PT    Recommendations for Other Services       Precautions / Restrictions Precautions Precautions: Fall Restrictions Weight Bearing Restrictions: Yes LLE Weight Bearing: Weight bearing as tolerated      Mobility  Bed Mobility Overal bed mobility: Needs Assistance;+2 for physical assistance Bed Mobility: Supine to Sit     Supine to sit: +2 for physical assistance;Total assist     General bed mobility comments: LLE very painful, pt not tolerating changes inposition well, requires help with trunk and LLE.   Transfers Overall transfer level: Needs assistance Equipment used: Rolling walker (2 wheeled) Transfers: Stand Pivot Transfers   Stand pivot transfers: Max assist       General transfer comment: Heavy verbal cues for sequencing and safety; pt is unable to WB on LLE due to pain, and arms become weak after 2 min.  Anxiety increases.   Ambulation/Gait Ambulation/Gait assistance:  (Inappropraite due to pain/weakness/anxiety. )              Stairs            Wheelchair Mobility    Modified Rankin (Stroke Patients Only)       Balance                                             Pertinent Vitals/Pain Pain Assessment: 0-10 Pain Score: 9  Pain Location: L hip. 0/10 at rest.  Pain Descriptors / Indicators: Operative site guarding Pain Intervention(s): Limited activity within patient's tolerance;Patient requesting pain meds-RN notified;Ice applied    Home Living Family/patient expects to be discharged to:: Skilled nursing facility Living Arrangements: Children Available Help at Discharge: Family Type of Home: House Home Access: Level entry     Home Layout: Two level Home Equipment: Environmental consultant - 2 wheels;Cane - quad;Bedside commode;Shower seat      Prior Function Level of Independence: Independent;Needs assistance         Comments: works out at [employer] 3x weekly. 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      Hand Dominance        Extremity/Trunk Assessment                         Communication   Communication: No difficulties  Cognition  Arousal/Alertness: Awake/alert Behavior During Therapy: WFL for tasks assessed/performed Overall Cognitive Status: History of cognitive impairments - at baseline       Memory: Decreased short-term memory              General Comments      Exercises Total Joint Exercises Ankle Circles/Pumps: AAROM;Both;20 reps Quad Sets: Left;AAROM Heel Slides: AAROM;Left;15 reps Hip ABduction/ADduction: AAROM;Left;15 reps Straight Leg Raises: AAROM;Left;15 reps      Assessment/Plan    PT Assessment Patient needs continued PT services  PT Diagnosis Difficulty walking;Abnormality of gait;Generalized weakness;Acute pain   PT Problem List Decreased  strength;Decreased mobility;Decreased range of motion;Decreased activity tolerance  PT Treatment Interventions DME instruction;Gait training;Functional mobility training;Therapeutic activities;Therapeutic exercise   PT Goals (Current goals can be found in the Care Plan section) Acute Rehab PT Goals Patient Stated Goal: Regain indep in ambulaiton/IADL PT Goal Formulation: With patient Time For Goal Achievement: 12/16/15 Potential to Achieve Goals: Fair    Frequency BID   Barriers to discharge        Co-evaluation               End of Session Equipment Utilized During Treatment: Gait belt Activity Tolerance: Patient limited by pain Patient left: in chair Nurse Communication: Mobility status;Need for lift equipment         Time: 1457-1534 PT Time Calculation (min) (ACUTE ONLY): 37 min   Charges:   PT Evaluation $PT Eval Moderate Complexity: 1 Procedure PT Treatments $Therapeutic Exercise: 8-22 mins $Therapeutic Activity: 8-22 mins   PT G Codes:        8:56 PM, 2015-12-29 Etta Grandchild, PT, DPT PRN Physical Therapist - [place] License # [id number] [phone] (mobile)

## 2015-12-02 NOTE — Progress Notes (Signed)
PROGRESS NOTE    Courtney [name]  [id number] DOB: October 22, 1928 DOA: 11/29/2015 PCP: No primary care provider on file. M.D. in [place]. Outpatient Specialists:     Brief Narrative:   Patient is an 82 year old woman with a history of mitral valve replacement on Coumadin, chronic atrial fibrillation, vascular dementia from a previous stroke, hypothyroidism, and hypertension. She presented to the ED on 11/29/2015 after falling at home. She complained of left hip pain. In the ED, she was afebrile and hemodynamically stable. Her lab data were significant for BUN of 24, creatinine of 1.01, and INR of 2.3. Pelvic and hip x-ray revealed an acute slightly angulated intertrochanteric fracture of the left hip with no pelvic fracture observed. Her chest x-ray revealed no acute findings. She was admitted for evaluation and management of acute left hip fracture.  Assessment & Plan:   Principal Problem:   Closed fracture of intertrochanteric section of femur [facility]) Active Problems:   Chronic anticoagulation   H/O mitral valve replacement   Fall at home   Chronic atrial fibrillation ([place])   Dehydration   Vascular dementia   Essential hypertension   Thrombocytopenia (HCC)   Hypothyroidism   Biatrial enlargement   Pulmonary hypertension, moderate to severe ([place])   1. Fall resulting in acute intratrochanteric fracture of the left hip. Patient was started on analgesics as needed for pain. Orthopedic surgeon, Dr. Aline Brochure was consulted. He requested a cardiology consult regarding whether the patient's chronic anticoagulation should be bridged or not. In the meantime, Coumadin was withheld and she was started on a heparin drip. Cardiologist, Dr. Jacinta Shoe agreed with perioperative heparin and deferred restarting Coumadin when feasible to orthopedic surgery. -Operative repair with intramedullary nail intratrochanteric left hip fracture, performed on 12/01/15. -We'll restart Coumadin for  anticoagulation. -Physical therapy ordered per Dr. Aline Brochure.  Chronic anticoagulation secondary to mitral valve replacement and chronic atrial fibrillation. Patient is treated chronically with Coumadin for anticoagulation and atenolol for rate control. -As above, Coumadin was withheld and she is being bridged with heparin. Atenolol was restarted. Her rate has been controlled. -Cardiology opined and ordered a 2-D echocardiogram to evaluate her LV function and mitral valve prosthesis. Of note, an old nuclear stress test was reviewed by cardiology and it showed myocardial scar, no ischemia, indicative of CAD. -Echo revealed an EF of 60-65%, normal functioning mechanical mitral valve; severe left atrial and massive right atrial enlargement with severe pulmonary hypertension. -Per Dr. Aline Brochure, will restart Coumadin. Will hold Plavix for another day or 2 before restarting it (for treatment of previous stroke). -? Need for heparin versus Lovenox for bridging. Patient's INR today is 2.15.  Severe biatrial enlargement and severe pulmonary hypertension with preserved left ventricle systolic function. -Noted per echo.  Thrombocytopenia. Patient's platelet count was within normal limits on admission. It decreased significantly to 115 and then to 109 following the start of heparin. HIT panel ordered and was apparently negative. -We'll continue to monitor her platelet count. Could restart Lovenox instead a heparin for bridging if patient's INR becomes subtherapeutic.  Hypertension. Patient is treated chronically with amlodipine, lisinopril, Dyazide, and atenolol. On admission, her blood pressure was on the lower side of normal, so Dyazide was withheld. Her blood pressure is trending up , but still not malignant. Will adjust doses of medications if needed following surgery.  Mild dehydration. On admission, patient's BUN was 25 and her creatinine was 1.01. It was felt that she was mildly dehydrated from the  fallen from Dyazide. She was started on gentle IV  fluids and Dyazide was withheld. Her BUN and creatinine have improved. -We will adjust IV fluids accordingly in the perioperative period.  Hypothyroidism. Patient was continued on Synthroid. TSH was within normal limits at 0.39 and free T4 was slightly elevated at 1.8. -We'll continue current dose. Patient will need follow-up TSH and free T4 in 3-6 months for reevaluation.   DVT prophylaxis: Coumadin>>> heparin>>> Coumadin Code Status: full code Family Communication: Family not available. Disposition Plan: Likely discharge to skilled nursing facility when clinically appropriate in the next couple of days.   Consultants:   Cardiology  Orthopedic surgery  Procedures:  1. Left hip operative repair-intramedullary nail intertrochanteric left hip fracture, Dr. Aline Brochure, 12/01/15  2. Echo 11/30/2015: - Left ventricle: The cavity size was normal. Wall thickness was  increased in a pattern of mild LVH. Systolic function was normal.  The estimated ejection fraction was in the range of 60% to 65%.  Wall motion was normal; there were no regional wall motion  abnormalities. The study was not technically sufficient to allow  evaluation of LV diastolic dysfunction due to atrial  fibrillation. - Aortic valve: Mildly to moderately calcified annulus. Trileaflet;  mildly thickened, mildly calcified leaflets. Possibly very mild  aortic stenosis. There was mild regurgitation. - Mitral valve: [employer] mechanical mitral valve noted, which  appears to be functioning normally. - Left atrium: The atrium was severely dilated. - Right ventricle: The cavity size was mildly dilated. - Right atrium: The atrium was massively dilated. - Atrial septum: No defect or patent foramen ovale was identified. - Tricuspid valve: Mildly thickened leaflets. There was  moderate-severe regurgitation. - Pulmonary arteries: Systolic pressure was severely increased.  PA  peak pressure: 65 mm Hg (S). - Inferior vena cava: The vessel was dilated. The respirophasic  diameter changes were blunted (< 50%), consistent with elevated   central venous pressure.  Antimicrobials:  Perioperative clindamycin.    Subjective: Patient denies left hip pain at rest. She has no complaints of chest pain or shortness of breath.  Objective: Filed Vitals:   12/01/15 1800 12/01/15 1822 12/01/15 2025 12/02/15 0634  BP:  124/50 144/74 124/57  Pulse:   85 78  Temp: 98 F (36.7 C) 97.5 F (36.4 C) 99 F (37.2 C) 98.6 F (37 C)  TempSrc:   Oral Oral  Resp:   18 20  Height:      Weight:    68.4 kg (150 lb 12.7 oz)  SpO2:  98% 99% 100%    Intake/Output Summary (Last 24 hours) at 12/02/15 1716 Last data filed at 12/02/15 0800  Gross per 24 hour  Intake    240 ml  Output    400 ml  Net   -160 ml   Filed Weights   11/29/15 0819 12/01/15 1412 12/02/15 0634  Weight: 59.875 kg (132 lb) 59.875 kg (132 lb) 68.4 kg (150 lb 12.7 oz)    Examination:  General exam: Appears calm and comfortable; No acute distress. Respiratory system: Clear to auscultation. Respiratory effort normal. Cardiovascular system: Irregular, irregular, with systolic click. RRR.  No pedal edema. Gastrointestinal system: Abdomen is nondistended, soft and nontender. No organomegaly or masses felt. Normal bowel sounds heard. Central nervous system: Alert and oriented to herself, and hospital, but she is not oriented to the name of the hospital, time, or year. She follows commands. Her speech is clear. No focal neurological deficits. Extremities: Left hip postoperative site with bandage in place. Patient able to wiggle toes on both feet. No  pedal edema.  Skin: No rashes, lesions or ulcers Psychiatry: Pleasant affect. Speech is clear.     Data Reviewed: I have personally reviewed following labs and imaging studies  CBC:  Recent Labs Lab 11/29/15 0909 11/30/15 0525 12/01/15 0600  12/01/15 0810 12/02/15 0628  WBC 7.8 7.1 6.9 6.8 7.3  NEUTROABS 6.6  --   --   --  5.4  HGB 12.5 11.5* 11.0* 11.2* 10.6*  HCT 38.6 34.4* 33.2* 33.3* 32.5*  MCV 85.6 84.7 84.7 84.5 85.5  PLT 168 150 115* 109* [id number]*   Basic Metabolic Panel:  Recent Labs Lab 11/29/15 0909 11/30/15 0525 12/01/15 0600 12/02/15 0628  NA 138 138 138 135  K 3.7 3.9 4.0 4.2  CL 101 105 106 104  CO2 29 24 25 26   GLUCOSE 92 102* 112* 99  BUN 24* 22* 15 15  CREATININE 1.01* 0.93 0.81 0.81  CALCIUM 10.0 9.1 9.1 8.9   GFR: Estimated Creatinine Clearance: 47.5 mL/min (by C-G formula based on Cr of 0.81). Liver Function Tests:  Recent Labs Lab 11/30/15 0525  AST 20  ALT 15  ALKPHOS 52  BILITOT 1.2  PROT 5.8*  ALBUMIN 3.7   No results for input(s): LIPASE, AMYLASE in the last 168 hours. No results for input(s): AMMONIA in the last 168 hours. Coagulation Profile:  Recent Labs Lab 11/30/15 0040 11/30/15 1416 12/01/15 0810 12/01/15 1217 12/02/15 0628  INR 2.62* 2.76* 2.00* 1.94* 2.15*   Cardiac Enzymes: No results for input(s): CKTOTAL, CKMB, CKMBINDEX, TROPONINI in the last 168 hours. BNP (last 3 results) No results for input(s): PROBNP in the last 8760 hours. HbA1C: No results for input(s): HGBA1C in the last 72 hours. CBG: No results for input(s): GLUCAP in the last 168 hours. Lipid Profile: No results for input(s): CHOL, HDL, LDLCALC, TRIG, CHOLHDL, LDLDIRECT in the last 72 hours. Thyroid Function Tests:  Recent Labs  12/01/15 0600  TSH 0.394  FREET4 1.80*   Anemia Panel: No results for input(s): VITAMINB12, FOLATE, FERRITIN, TIBC, IRON, RETICCTPCT in the last 72 hours. Urine analysis:    Component Value Date/Time   COLORURINE YELLOW 07/12/2015 1345   APPEARANCEUR HAZY* 07/12/2015 1345   LABSPEC 1.020 07/12/2015 1345   PHURINE 6.0 07/12/2015 1345   GLUCOSEU NEGATIVE 07/12/2015 1345   HGBUR LARGE* 07/12/2015 1345   BILIRUBINUR NEGATIVE 07/12/2015 1345   KETONESUR  TRACE* 07/12/2015 1345   PROTEINUR 100* 07/12/2015 1345   NITRITE POSITIVE* 07/12/2015 1345   LEUKOCYTESUR MODERATE* 07/12/2015 1345   Sepsis Labs: @LABRCNTIP (procalcitonin:4,lacticidven:4)  ) Recent Results (from the past 240 hour(s))  Surgical pcr screen     Status: Abnormal   Collection Time: 11/30/15  9:08 AM  Result Value Ref Range Status   MRSA, PCR NEGATIVE NEGATIVE Final   Staphylococcus aureus POSITIVE (A) NEGATIVE Final    Comment:        The Xpert SA Assay (FDA approved for NASAL specimens in patients over 31 years of age), is one component of a comprehensive surveillance program.  Test performance has been validated by [facility] for patients greater than or equal to 15 year old. It is not intended to diagnose infection nor to guide or monitor treatment. RESULT CALLED TO, READ BACK BY AND VERIFIED WITH: MAYS,J. AT 1304 ON 11/30/2015 BY BAUGHAM,M.          Radiology Studies: Dg Hip Operative Unilat With Pelvis Left  12/01/2015  CLINICAL DATA:  Left hip fracture. EXAM: OPERATIVE LEFT HIP (WITH PELVIS IF PERFORMED)  VIEWS TECHNIQUE: Fluoroscopic spot image(s) were submitted for interpretation post-operatively. COMPARISON:  None. FINDINGS: Open reduction internal fixation left hip with good anatomic alignment. Hardware intact . IMPRESSION: ORIF left hip. Electronically Signed   By: Marcello Moores  Register   On: 12/01/2015 17:16        Scheduled Meds: . amLODipine  2.5 mg Oral Daily  . atenolol  12.5 mg Oral Daily  . Chlorhexidine Gluconate Cloth  6 each Topical Daily  . docusate sodium  100 mg Oral BID  . levothyroxine  125 mcg Oral QAC breakfast  . lisinopril  2.5 mg Oral Daily  . mupirocin ointment  1 application Nasal BID  . traMADol  50 mg Oral Q6H  . vitamin C  500 mg Oral BID  . Warfarin - Pharmacist Dosing Inpatient   Does not apply q1800   Continuous Infusions: . sodium chloride 75 mL/hr at 12/01/15 1934     LOS: 3 days    Time spent: 60  minutes    Rexene Alberts, MD [facility] Pager [phone]  If 7PM-7AM, please contact night-coverage [email or web address] Password [facility] 12/02/2015, 5:16 PM

## 2015-12-03 LAB — CBC
HEMATOCRIT: 31.6 % — AB (ref 36.0–46.0)
Hemoglobin: 10.3 g/dL — ABNORMAL LOW (ref 12.0–15.0)
MCH: 27.9 pg (ref 26.0–34.0)
MCHC: 32.6 g/dL (ref 30.0–36.0)
MCV: 85.6 fL (ref 78.0–100.0)
Platelets: 140 10*3/uL — ABNORMAL LOW (ref 150–400)
RBC: 3.69 MIL/uL — AB (ref 3.87–5.11)
RDW: 15.8 % — ABNORMAL HIGH (ref 11.5–15.5)
WBC: 7.4 10*3/uL (ref 4.0–10.5)

## 2015-12-03 LAB — HEPARIN LEVEL (UNFRACTIONATED): HEPARIN UNFRACTIONATED: 2.02 [IU]/mL — AB (ref 0.30–0.70)

## 2015-12-03 LAB — TYPE AND SCREEN
ABO/RH(D): A POS
Antibody Screen: NEGATIVE
UNIT DIVISION: 0
UNIT DIVISION: 0
Unit division: 0

## 2015-12-03 LAB — PROTIME-INR
INR: 2.08 — ABNORMAL HIGH (ref 0.00–1.49)
PROTHROMBIN TIME: 23.2 s — AB (ref 11.6–15.2)

## 2015-12-03 MED ORDER — WARFARIN SODIUM 5 MG PO TABS
5.0000 mg | ORAL_TABLET | Freq: Once | ORAL | Status: AC
  Administered 2015-12-03: 5 mg via ORAL
  Filled 2015-12-03: qty 1

## 2015-12-03 MED ORDER — HEPARIN (PORCINE) IN NACL 100-0.45 UNIT/ML-% IJ SOLN: 1050.0000 [IU]/h | INTRAMUSCULAR | Status: DC

## 2015-12-03 MED ORDER — HEPARIN (PORCINE) IN NACL 100-0.45 UNIT/ML-% IJ SOLN
1050.0000 [IU]/h | INTRAMUSCULAR | Status: DC
  Administered 2015-12-03: 1050 [IU]/h via INTRAVENOUS
  Filled 2015-12-03: qty 250

## 2015-12-03 NOTE — Progress Notes (Signed)
Patient ID: Courtney [name], female   DOB: [birth year], 82 y.o.   MRN: [id number] Postop day 2 I am nail left hip did not do well with therapy yesterday  BP 125/60 mmHg  Pulse 68  Temp(Src) 99.1 F (37.3 C) (Oral)  Resp 19  Ht 5\' 6"  (1.676 m)  Wt 150 lb 12.7 oz (68.4 kg)  BMI 24.35 kg/m2  SpO2 90%  Patient can go to skilled care whenever a bed is available with the following orders  The postoperative plan  Weightbearing as tolerated Heparin bridge back to warfarin we can proceed as there was no spinal  Appropriate monitoring of hemoglobin  Staples out at 2 weeks  Follow-up in my office in 2 weeks after discharge X-rays at 2 weeks, 6 weeks and 12 weeks

## 2015-12-03 NOTE — Progress Notes (Signed)
[place] for heparin Indication: MVR  Allergies  Allergen Reactions  . Penicillins Swelling    Has patient had a PCN reaction causing immediate rash, facial/tongue/throat swelling, SOB or lightheadedness with hypotension: Yes Has patient had a PCN reaction causing severe rash involving mucus membranes or skin necrosis: No Has patient had a PCN reaction that required hospitalization No Has patient had a PCN reaction occurring within the last 10 years: No If all of the above answers are "NO", then may proceed with Cephalosporin use. Whole body "swelled up" and had to go to the hospital   Patient Measurements: Height: 5\' 6"  (167.6 cm) Weight: 150 lb 12.7 oz (68.4 kg) IBW/kg (Calculated) : 59.3 Heparin Dosing Weight: 59.9 kg  Vital Signs: Temp: 99.5 F (37.5 C) (04/30 1425) Temp Source: Oral (04/30 1425) BP: 123/86 mmHg (04/30 1425) Pulse Rate: 81 (04/30 1425)  Labs:  Recent Labs  12/01/15 0600  12/01/15 0810 12/01/15 1217 12/02/15 0628 12/03/15 0611 12/03/15 1845  HGB 11.0*  --  11.2*  --  10.6* 10.3*  --   HCT 33.2*  --  33.3*  --  32.5* 31.6*  --   PLT 115*  --  109*  --  111* 140*  --   LABPROT  --   < > 22.6* 22.0* 23.8* 23.2*  --   INR  --   < > 2.00* 1.94* 2.15* 2.08*  --   HEPARINUNFRC 0.24*  --   --   --   --   --  2.02*  CREATININE 0.81  --   --   --  0.81  --   --   < > = values in this interval not displayed. Estimated Creatinine Clearance: 47.5 mL/min (by C-G formula based on Cr of 0.81).  Medical History: Past Medical History  Diagnosis Date  . Stroke ([place])   . Hypertension   . Vascular dementia   . Hypothyroidism   . History of mitral valve replacement   . UTI (lower urinary tract infection)   . Permanent atrial fibrillation ([place])   . Chronic anticoagulation     coumadin, CHADS2VASC=6  . Biatrial enlargement 12/01/2015  . Pulmonary hypertension, moderate to severe ([place]) 12/01/2015   Medications:   Prescriptions prior to admission  Medication Sig Dispense Refill Last Dose  . amLODipine (NORVASC) 2.5 MG tablet Take 2.5 mg by mouth daily.   11/29/2015 at Unknown time  . atenolol (TENORMIN) 25 MG tablet Take 12.5 mg by mouth.   11/29/2015 at Unknown time  . b complex vitamins tablet Take 1 tablet by mouth daily.   11/29/2015 at Unknown time  . clopidogrel (PLAVIX) 75 MG tablet Take 75 mg by mouth daily.   11/29/2015 at Unknown time  . levothyroxine (SYNTHROID, LEVOTHROID) 125 MCG tablet Take 125 mcg by mouth daily before breakfast.   11/29/2015 at Unknown time  . lisinopril (PRINIVIL,ZESTRIL) 2.5 MG tablet Take 2.5 mg by mouth daily.   11/29/2015 at Unknown time  . triamterene-hydrochlorothiazide (DYAZIDE) 37.5-25 MG capsule Take 1 capsule by mouth daily.   11/29/2015 at Unknown time  . vitamin C (ASCORBIC ACID) 500 MG tablet Take 500 mg by mouth 2 (two) times daily.   11/29/2015 at Unknown time  . warfarin (COUMADIN) 2 MG tablet Take 2 mg by mouth daily. Pt takes 2mg  one day and 4mg  the next   11/28/2015 at 1700  . acetaminophen (TYLENOL) 500 MG tablet Take 500 mg by mouth every 6 (six) hours  as needed for mild pain.   unknown  . ciprofloxacin (CIPRO) 500 MG tablet Take 1 tablet (500 mg total) by mouth 2 (two) times daily. Antibiotic be taken for 3 more days, starting tomorrow. (Patient not taking: Reported on 11/29/2015) 6 tablet 0   . docusate sodium (COLACE) 100 MG capsule Take 100 mg by mouth 2 (two) times daily.   unknown  . ibandronate (BONIVA) 150 MG tablet Take 150 mg by mouth every 30 (thirty) days.  0 2 weeks   Assessment: 82 yo lady on heparin for MVR.  Heparin level is elevated  Goal of Therapy:  Heparin level 0.3-0.7 units/ml Monitor platelets by anticoagulation protocol: Yes   Plan:  Hold heparin for 1 hour Restart heparin at 850 units/hr Daily INR, heparin level and CBC Monitor for bleeding complications  Dare Spillman [place] 12/03/2015,7:48 PM

## 2015-12-03 NOTE — Anesthesia Postprocedure Evaluation (Signed)
Anesthesia Post Note  Patient: Courtney [name]  Procedure(s) Performed: Procedure(s) (LRB): INTRAMEDULLARY (IM) NAIL INTERTROCHANTRIC (Left)  Patient location during evaluation: Nursing Unit Anesthesia Type: Spinal Level of consciousness: awake and alert Pain management: pain level controlled Vital Signs Assessment: post-procedure vital signs reviewed and stable Cardiovascular status: stable Postop Assessment: no signs of nausea or vomiting Anesthetic complications: no    Last Vitals:  Filed Vitals:   12/02/15 2131 12/03/15 0549  BP: 120/55 125/60  Pulse: 74 68  Temp: 37.2 C 37.3 C  Resp: 17 19    Last Pain:  Filed Vitals:   12/03/15 0550  PainSc: Asleep                 Sharra Cayabyab A

## 2015-12-03 NOTE — Progress Notes (Signed)
PROGRESS NOTE    Courtney [name]  [id number] DOB: [birth year] DOA: 11/29/2015 PCP: No primary care provider on file. M.D. in [place]. Outpatient Specialists:     Brief Narrative:   Patient is an 82 year old woman with a history of mitral valve replacement on Coumadin, chronic atrial fibrillation, vascular dementia from a previous stroke, hypothyroidism, and hypertension. She presented to the ED on 11/29/2015 after falling at home. She complained of left hip pain. In the ED, she was afebrile and hemodynamically stable. Her lab data were significant for BUN of 24, creatinine of 1.01, and INR of 2.3. Pelvic and hip x-ray revealed an acute slightly angulated intertrochanteric fracture of the left hip with no pelvic fracture observed. Her chest x-ray revealed no acute findings. She was admitted for evaluation and management of acute left hip fracture.  Assessment & Plan:   Principal Problem:   Closed fracture of intertrochanteric section of femur [facility]) Active Problems:   Chronic anticoagulation   H/O mitral valve replacement   Fall at home   Chronic atrial fibrillation ([place])   Dehydration   Vascular dementia   Essential hypertension   Thrombocytopenia (HCC)   Hypothyroidism   Biatrial enlargement   Pulmonary hypertension, moderate to severe ([place])   1. Fall resulting in acute intratrochanteric fracture of the left hip. Patient was started on analgesics as needed for pain. Orthopedic surgeon, Dr. Aline Brochure was consulted. He requested a cardiology consult regarding whether the patient's chronic anticoagulation should be bridged or not. In the meantime, Coumadin was withheld and she was started on a heparin drip. Cardiologist, Dr. Jacinta Shoe agreed with perioperative heparin and deferred restarting Coumadin when feasible to orthopedic surgery. -Operative repair with intramedullary nail intratrochanteric left hip fracture, performed on 12/01/15. - Coumadin restarted on 12/02/15 for  anticoagulation. -Physical therapy ordered per Dr. Aline Brochure. PT recommended skilled nursing facility placement for rehabilitation.  Chronic anticoagulation secondary to mitral valve replacement and chronic atrial fibrillation. Patient is treated chronically with Coumadin for anticoagulation and atenolol for rate control. -As above, Coumadin was withheld and she is being bridged with heparin. Atenolol was restarted. Her rate has been controlled. -Cardiology opined and ordered a 2-D echocardiogram to evaluate her LV function and mitral valve prosthesis. Of note, an old nuclear stress test was reviewed by cardiology and it showed myocardial scar, no ischemia, indicative of CAD. -Echo revealed an EF of 60-65%, normal functioning mechanical mitral valve; severe left atrial and massive right atrial enlargement with severe pulmonary hypertension. - Coumadin restarted on 12/02/15 following Dr. Ruthe Mannan recommendation. Will hold Plavix for another day or 2 before restarting it (for treatment of previous stroke). -Patient's INR is 2.08; INR is at least 2.5-3.0. We'll restart heparin drip cautiously due to recent thrombocytopenia.  Severe biatrial enlargement and severe pulmonary hypertension with preserved left ventricle systolic function. -Noted per echo.  Thrombocytopenia. Patient's platelet count was within normal limits on admission. It decreased significantly to 115 and then to 109 following the start of heparin. HIT panel ordered and was apparently negative. -Platelet count has improved to 140. Heparin is being restarted cautiously due to her relatively subtherapeutic INR in the setting of mitral valve replacement. This was discussed with pharmacy.   Hypertension. Patient is treated chronically with amlodipine, lisinopril, Dyazide, and atenolol. On admission, her blood pressure was on the lower side of normal, so Dyazide was withheld. Her blood pressure has trended up , but still not malignant. Will  adjust doses of medications if needed following surgery.  Mild dehydration. On admission,  patient's BUN was 25 and her creatinine was 1.01. It was felt that she was mildly dehydrated from the fallen from Dyazide. She was started on gentle IV fluids and Dyazide was withheld. Her BUN and creatinine have improved. -We will adjust IV fluids accordingly in the perioperative period.  Hypothyroidism. Patient was continued on Synthroid. TSH was within normal limits at 0.39 and free T4 was slightly elevated at 1.8. -We'll continue the current dose. Patient will need follow-up TSH and free T4 in 3-6 months for reevaluation.   DVT prophylaxis: Coumadin and heparin Code Status: full code Family Communication: Family not available. Disposition Plan: Likely discharge to skilled nursing facility when clinically appropriate in the next day or 2.   Consultants:   Cardiology  Orthopedic surgery  Procedures:  1. Left hip operative repair-intramedullary nail intertrochanteric left hip fracture, Dr. Aline Brochure, 12/01/15  2. Echo 11/30/2015: - Left ventricle: The cavity size was normal. Wall thickness was  increased in a pattern of mild LVH. Systolic function was normal.  The estimated ejection fraction was in the range of 60% to 65%.  Wall motion was normal; there were no regional wall motion  abnormalities. The study was not technically sufficient to allow  evaluation of LV diastolic dysfunction due to atrial  fibrillation. - Aortic valve: Mildly to moderately calcified annulus. Trileaflet;  mildly thickened, mildly calcified leaflets. Possibly very mild  aortic stenosis. There was mild regurgitation. - Mitral valve: [employer]. Jude mechanical mitral valve noted, which  appears to be functioning normally. - Left atrium: The atrium was severely dilated. - Right ventricle: The cavity size was mildly dilated. - Right atrium: The atrium was massively dilated. - Atrial septum: No defect or patent  foramen ovale was identified. - Tricuspid valve: Mildly thickened leaflets. There was  moderate-severe regurgitation. - Pulmonary arteries: Systolic pressure was severely increased. PA  peak pressure: 65 mm Hg (S). - Inferior vena cava: The vessel was dilated. The respirophasic  diameter changes were blunted (< 50%), consistent with elevated   central venous pressure.  Antimicrobials:  Perioperative clindamycin.    Subjective: Patient denies left hip pain at rest.  Objective: Filed Vitals:   12/02/15 2131 12/03/15 0549 12/03/15 1226 12/03/15 1425  BP: 120/55 125/60  123/86  Pulse: 74 68 76 81  Temp: 98.9 F (37.2 C) 99.1 F (37.3 C)  99.5 F (37.5 C)  TempSrc: Oral Oral  Oral  Resp: 17 19  18   Height:      Weight:      SpO2: 94% 90% 93% 90%    Intake/Output Summary (Last 24 hours) at 12/03/15 1614 Last data filed at 12/03/15 1200  Gross per 24 hour  Intake    720 ml  Output    800 ml  Net    -80 ml   Filed Weights   11/29/15 0819 12/01/15 1412 12/02/15 0634  Weight: 59.875 kg (132 lb) 59.875 kg (132 lb) 68.4 kg (150 lb 12.7 oz)    Examination:  General exam: Appears calm and comfortable; No acute distress. Respiratory system: Clear to auscultation. Respiratory effort normal. Cardiovascular system: Irregular, irregular, with systolic click.  No pedal edema. Gastrointestinal system: Abdomen is nondistended, soft and nontender. No organomegaly or masses felt. Normal bowel sounds heard. Central nervous system: Alert and oriented to herself, and hospital, but she is not oriented to the name of the hospital. She follows commands. Her speech is clear. No focal neurological deficits. Extremities: Left hip postoperative site with bandage in place. Patient able  to wiggle toes on both feet. No pedal edema.  Skin: No rashes, lesions or ulcers Psychiatry: Pleasant affect. Speech is clear.     Data Reviewed: I have personally reviewed following labs and imaging  studies  CBC:  Recent Labs Lab 11/29/15 0909 11/30/15 0525 12/01/15 0600 12/01/15 0810 12/02/15 0628 12/03/15 0611  WBC 7.8 7.1 6.9 6.8 7.3 7.4  NEUTROABS 6.6  --   --   --  5.4  --   HGB 12.5 11.5* 11.0* 11.2* 10.6* 10.3*  HCT 38.6 34.4* 33.2* 33.3* 32.5* 31.6*  MCV 85.6 84.7 84.7 84.5 85.5 85.6  PLT 168 150 115* 109* 111* [id number]*   Basic Metabolic Panel:  Recent Labs Lab 11/29/15 0909 11/30/15 0525 12/01/15 0600 12/02/15 0628  NA 138 138 138 135  K 3.7 3.9 4.0 4.2  CL 101 105 106 104  CO2 29 24 25 26   GLUCOSE 92 102* 112* 99  BUN 24* 22* 15 15  CREATININE 1.01* 0.93 0.81 0.81  CALCIUM 10.0 9.1 9.1 8.9   GFR: Estimated Creatinine Clearance: 47.5 mL/min (by C-G formula based on Cr of 0.81). Liver Function Tests:  Recent Labs Lab 11/30/15 0525  AST 20  ALT 15  ALKPHOS 52  BILITOT 1.2  PROT 5.8*  ALBUMIN 3.7   No results for input(s): LIPASE, AMYLASE in the last 168 hours. No results for input(s): AMMONIA in the last 168 hours. Coagulation Profile:  Recent Labs Lab 11/30/15 1416 12/01/15 0810 12/01/15 1217 12/02/15 0628 12/03/15 0611  INR 2.76* 2.00* 1.94* 2.15* 2.08*   Cardiac Enzymes: No results for input(s): CKTOTAL, CKMB, CKMBINDEX, TROPONINI in the last 168 hours. BNP (last 3 results) No results for input(s): PROBNP in the last 8760 hours. HbA1C: No results for input(s): HGBA1C in the last 72 hours. CBG: No results for input(s): GLUCAP in the last 168 hours. Lipid Profile: No results for input(s): CHOL, HDL, LDLCALC, TRIG, CHOLHDL, LDLDIRECT in the last 72 hours. Thyroid Function Tests:  Recent Labs  12/01/15 0600  TSH 0.394  FREET4 1.80*   Anemia Panel: No results for input(s): VITAMINB12, FOLATE, FERRITIN, TIBC, IRON, RETICCTPCT in the last 72 hours. Urine analysis:    Component Value Date/Time   COLORURINE YELLOW 07/12/2015 1345   APPEARANCEUR HAZY* 07/12/2015 1345   LABSPEC 1.020 07/12/2015 1345   PHURINE 6.0 07/12/2015 1345    GLUCOSEU NEGATIVE 07/12/2015 1345   HGBUR LARGE* 07/12/2015 1345   BILIRUBINUR NEGATIVE 07/12/2015 1345   KETONESUR TRACE* 07/12/2015 1345   PROTEINUR 100* 07/12/2015 1345   NITRITE POSITIVE* 07/12/2015 1345   LEUKOCYTESUR MODERATE* 07/12/2015 1345   Sepsis Labs: @LABRCNTIP (procalcitonin:4,lacticidven:4)  ) Recent Results (from the past 240 hour(s))  Surgical pcr screen     Status: Abnormal   Collection Time: 11/30/15  9:08 AM  Result Value Ref Range Status   MRSA, PCR NEGATIVE NEGATIVE Final   Staphylococcus aureus POSITIVE (A) NEGATIVE Final    Comment:        The Xpert SA Assay (FDA approved for NASAL specimens in patients over 64 years of age), is one component of a comprehensive surveillance program.  Test performance has been validated by [facility] for patients greater than or equal to 29 year old. It is not intended to diagnose infection nor to guide or monitor treatment. RESULT CALLED TO, READ BACK BY AND VERIFIED WITH: MAYS,J. AT 1304 ON 11/30/2015 BY BAUGHAM,M.          Radiology Studies: Dg Hip Operative Unilat With Pelvis Left  12/01/2015  CLINICAL DATA:  Left hip fracture. EXAM: OPERATIVE LEFT HIP (WITH PELVIS IF PERFORMED)  VIEWS TECHNIQUE: Fluoroscopic spot image(s) were submitted for interpretation post-operatively. COMPARISON:  None. FINDINGS: Open reduction internal fixation left hip with good anatomic alignment. Hardware intact . IMPRESSION: ORIF left hip. Electronically Signed   By: Marcello Moores  Register   On: 12/01/2015 17:16        Scheduled Meds: . amLODipine  2.5 mg Oral Daily  . atenolol  12.5 mg Oral Daily  . Chlorhexidine Gluconate Cloth  6 each Topical Daily  . docusate sodium  100 mg Oral BID  . levothyroxine  125 mcg Oral QAC breakfast  . lisinopril  2.5 mg Oral Daily  . mupirocin ointment  1 application Nasal BID  . traMADol  50 mg Oral Q6H  . vitamin C  500 mg Oral BID  . warfarin  5 mg Oral Once  . Warfarin - Pharmacist  Dosing Inpatient   Does not apply q1800   Continuous Infusions: . sodium chloride 40 mL/hr at 12/02/15 1751  . heparin 1,050 Units/hr (12/03/15 1110)     LOS: 4 days    Time spent: 30 minutes    Rexene Alberts, MD [facility] Pager [phone]  If 7PM-7AM, please contact night-coverage [email or web address] Password [email or web address] 12/03/2015, 4:14 PM

## 2015-12-03 NOTE — Progress Notes (Addendum)
[place] for coumadin with heparin bridge Indication: MVR  Allergies  Allergen Reactions  . Penicillins Swelling    Has patient had a PCN reaction causing immediate rash, facial/tongue/throat swelling, SOB or lightheadedness with hypotension: Yes Has patient had a PCN reaction causing severe rash involving mucus membranes or skin necrosis: No Has patient had a PCN reaction that required hospitalization No Has patient had a PCN reaction occurring within the last 10 years: No If all of the above answers are "NO", then may proceed with Cephalosporin use. Whole body "swelled up" and had to go to the hospital   Patient Measurements: Height: 5\' 6"  (167.6 cm) Weight: 150 lb 12.7 oz (68.4 kg) IBW/kg (Calculated) : 59.3 Heparin Dosing Weight: 59.9 kg  Vital Signs: Temp: 99.1 F (37.3 C) (04/30 0549) Temp Source: Oral (04/30 0549) BP: 125/60 mmHg (04/30 0549) Pulse Rate: 68 (04/30 0549)  Labs:  Recent Labs  11/30/15 1015  11/30/15 1416  12/01/15 0600 12/01/15 0810 12/01/15 1217 12/02/15 0628 12/03/15 0611  HGB  --   --   --   < > 11.0* 11.2*  --  10.6* 10.3*  HCT  --   --   --   < > 33.2* 33.3*  --  32.5* 31.6*  PLT  --   --   --   < > 115* 109*  --  111* 140*  LABPROT  --   < > 28.7*  --   --  22.6* 22.0* 23.8* 23.2*  INR  --   < > 2.76*  --   --  2.00* 1.94* 2.15* 2.08*  HEPARINUNFRC 0.35  --  0.32  --  0.24*  --   --   --   --   CREATININE  --   --   --   --  0.81  --   --  0.81  --   < > = values in this interval not displayed. Estimated Creatinine Clearance: 47.5 mL/min (by C-G formula based on Cr of 0.81).  Medical History: Past Medical History  Diagnosis Date  . Stroke ([place])   . Hypertension   . Vascular dementia   . Hypothyroidism   . History of mitral valve replacement   . UTI (lower urinary tract infection)   . Permanent atrial fibrillation ([place])   . Chronic anticoagulation     coumadin, CHADS2VASC=6  . Biatrial  enlargement 12/01/2015  . Pulmonary hypertension, moderate to severe ([place]) 12/01/2015   Medications:  Prescriptions prior to admission  Medication Sig Dispense Refill Last Dose  . amLODipine (NORVASC) 2.5 MG tablet Take 2.5 mg by mouth daily.   11/29/2015 at Unknown time  . atenolol (TENORMIN) 25 MG tablet Take 12.5 mg by mouth.   11/29/2015 at Unknown time  . b complex vitamins tablet Take 1 tablet by mouth daily.   11/29/2015 at Unknown time  . clopidogrel (PLAVIX) 75 MG tablet Take 75 mg by mouth daily.   11/29/2015 at Unknown time  . levothyroxine (SYNTHROID, LEVOTHROID) 125 MCG tablet Take 125 mcg by mouth daily before breakfast.   11/29/2015 at Unknown time  . lisinopril (PRINIVIL,ZESTRIL) 2.5 MG tablet Take 2.5 mg by mouth daily.   11/29/2015 at Unknown time  . triamterene-hydrochlorothiazide (DYAZIDE) 37.5-25 MG capsule Take 1 capsule by mouth daily.   11/29/2015 at Unknown time  . vitamin C (ASCORBIC ACID) 500 MG tablet Take 500 mg by mouth 2 (two) times daily.   11/29/2015 at Unknown time  .  warfarin (COUMADIN) 2 MG tablet Take 2 mg by mouth daily. Pt takes 2mg  one day and 4mg  the next   11/28/2015 at 1700  . acetaminophen (TYLENOL) 500 MG tablet Take 500 mg by mouth every 6 (six) hours as needed for mild pain.   unknown  . ciprofloxacin (CIPRO) 500 MG tablet Take 1 tablet (500 mg total) by mouth 2 (two) times daily. Antibiotic be taken for 3 more days, starting tomorrow. (Patient not taking: Reported on 11/29/2015) 6 tablet 0   . docusate sodium (COLACE) 100 MG capsule Take 100 mg by mouth 2 (two) times daily.   unknown  . ibandronate (BONIVA) 150 MG tablet Take 150 mg by mouth every 30 (thirty) days.  0 2 weeks   Assessment: 82 yo lady on coumadin PTA for MVR.  She broke her hip and coumadin was held anticipation for surgery.  She is now POD #2 and heparin will be resumed.  Coumadin resumed yesterday.  INR 2.08.  No bleeding complications noted  Goal of Therapy:  Heparin level 0.3-0.7  units/ml INR 2.5-3.5 Monitor platelets by anticoagulation protocol: Yes   Plan:  Resume heparin at 1050 units/hr with no bolus Check heparin level ~ 8 hours after retart Coumadin 5 mg po today Daily INR, heparin level and CBC Monitor for bleeding complications  Courtney [name] 12/03/2015,8:14 AM

## 2015-12-03 NOTE — Progress Notes (Signed)
Critical lab value called in by lab tech Karleen Hampshire. Heparin study:2.02. Provider notified.

## 2015-12-03 NOTE — Progress Notes (Signed)
2Physical Therapy Treatment Patient Details Name: Courtney [name] MRN: [id number] DOB: May 12, 1929 Today's Date: 12/03/2015    History of Present Illness Courtney [name] is an 82yo white female who sustained a fall at home with immediate pain, and came to [facility] found to have L hip fracture. Pt presents today at POD2 s/p IM nailing. At baseline, pt is an active community dweller, fully independent.     PT Comments    Pt tolerating treatment session well, motivated and able to complete entire PT sesssion as planned. Pt continues to make progress toward goals as evidenced by improved pain management, improved indep c transfers, and improved indep in bed mobility. Pt's greatest limitation continues to be weakness which continues to limit ability to perform all mobility and HEP at baseline function. Patient presenting with impairment of strength, pain, range of motion, balance, and activity tolerance, limiting ability to perform ADL and mobility tasks at  baseline level of function. Patient will benefit from skilled intervention to address the above impairments and limitations, in order to restore to prior level of function, improve patient safety upon discharge, and to decrease caregiver burden.    Follow Up Recommendations  SNF     Equipment Recommendations  None recommended by PT    Recommendations for Other Services       Precautions / Restrictions Precautions Precautions: Fall Restrictions LLE Weight Bearing: Weight bearing as tolerated    Mobility  Bed Mobility Overal bed mobility: Needs Assistance Bed Mobility: Supine to Sit     Supine to sit: Max assist     General bed mobility comments: Imporved ability to self assisty with BUE, including HHA, but still needs some trunk support from PT. Better with scooting and weight shifting. Lateral sliding in bed is improved, but still total assist c draw sheet.   Transfers Overall transfer level: Needs assistance   Transfers: Sit to/from  Stand Sit to Stand: Mod assist;From elevated surface         General transfer comment: requires cues for safe use of stedy, foot placement, tibial placement, but is motivated and performes 3x with growing confidence and facility. The patient is limited by weakness and anxiety more than pain todya, but all are imporved since yesterday.   Ambulation/Gait                 Stairs            Wheelchair Mobility    Modified Rankin (Stroke Patients Only)       Balance Overall balance assessment: Needs assistance   Sitting balance-Leahy Scale: Good Sitting balance - Comments: limited pain tolerance to >90degrees L hip flexion during sitting upright.                             Cognition Arousal/Alertness: Awake/alert Behavior During Therapy: WFL for tasks assessed/performed Overall Cognitive Status: History of cognitive impairments - at baseline       Memory: Decreased short-term memory              Exercises Total Joint Exercises Ankle Circles/Pumps: AAROM;Both;20 reps Quad Sets: Left;AROM;15 reps Heel Slides: AAROM;Left;15 reps Hip ABduction/ADduction: AAROM;Left;15 reps Straight Leg Raises:  (attempted, but too weak to perform with any benefit. )    General Comments        Pertinent Vitals/Pain Pain Assessment: 0-10 Pain Score: 8  Pain Location: L hip 0/10 at rest, improved tolerance since yesterdday.  Pain Descriptors / Indicators: Operative  site guarding Pain Intervention(s): Limited activity within patient's tolerance;Monitored during session;Premedicated before session;Repositioned;Ice applied    Home Living                      Prior Function            PT Goals (current goals can now be found in the care plan section) Acute Rehab PT Goals Patient Stated Goal: Regain indep in ambulaiton/IADL PT Goal Formulation: With patient Time For Goal Achievement: 12/16/15 Potential to Achieve Goals: Fair Progress towards PT  goals: Progressing toward goals    Frequency  BID    PT Plan Current plan remains appropriate    Co-evaluation             End of Session Equipment Utilized During Treatment: Gait belt Activity Tolerance: Patient limited by pain;Patient tolerated treatment well;Patient limited by fatigue Patient left: in chair;with nursing/sitter in room;with call bell/phone within reach     Time: 1130-1200 PT Time Calculation (min) (ACUTE ONLY): 30 min  Charges:  $Therapeutic Exercise: 8-22 mins $Therapeutic Activity: 8-22 mins                    G Codes:      1:22 PM, 12-12-2015 Etta Grandchild, PT, DPT PRN Physical Therapist - [place] License # [id number] ([phone] (mobile)

## 2015-12-04 ENCOUNTER — Encounter (HOSPITAL_COMMUNITY): Payer: Self-pay | Admitting: Orthopedic Surgery

## 2015-12-04 ENCOUNTER — Inpatient Hospital Stay
Admission: RE | Admit: 2015-12-04 | Discharge: 2016-01-23 | Disposition: A | Discharge status: Home / Self Care | Payer: Medicare Other | Source: Ambulatory Visit | Attending: Internal Medicine | Admitting: Internal Medicine

## 2015-12-04 DIAGNOSIS — M25552 Pain in left hip: Principal | ICD-10-CM

## 2015-12-04 LAB — BASIC METABOLIC PANEL
Anion gap: 6 (ref 5–15)
BUN: 21 mg/dL (ref 4–21)
BUN: 21 mg/dL — AB (ref 6–20)
CHLORIDE: 102 mmol/L (ref 101–111)
CO2: 28 mmol/L (ref 22–32)
CREATININE: 0.9 mg/dL (ref <=1.1)
CREATININE: 0.88 mg/dL (ref 0.44–1.00)
Calcium: 9 mg/dL (ref 8.9–10.3)
GFR calc Af Amer: >60 mL/min (ref >60)
GFR calc non Af Amer: 58 mL/min — ABNORMAL LOW (ref >60)
GLUCOSE: 101 mg/dL — AB (ref 65–99)
Glucose: 101 mg/dL
POTASSIUM: 4.1 mmol/L (ref 3.5–5.1)
SODIUM: 136 mmol/L (ref 135–145)
Sodium: 136 mmol/L — AB (ref 137–147)

## 2015-12-04 LAB — PROTIME-INR
INR: 2.05 — AB (ref 0.00–1.49)
Prothrombin Time: 23 seconds — ABNORMAL HIGH (ref 11.6–15.2)

## 2015-12-04 LAB — CBC
HEMATOCRIT: 30.9 % — AB (ref 36.0–46.0)
HEMOGLOBIN: 10.1 g/dL — AB (ref 12.0–15.0)
MCH: 28.1 pg (ref 26.0–34.0)
MCHC: 32.7 g/dL (ref 30.0–36.0)
MCV: 86.1 fL (ref 78.0–100.0)
Platelets: 164 10*3/uL (ref 150–400)
RBC: 3.59 MIL/uL — AB (ref 3.87–5.11)
RDW: 15.6 % — ABNORMAL HIGH (ref 11.5–15.5)
WBC: 6.9 10*3/uL (ref 4.0–10.5)

## 2015-12-04 LAB — HEPARIN LEVEL (UNFRACTIONATED): Heparin Unfractionated: <0.1 IU/mL — ABNORMAL LOW (ref 0.30–0.70)

## 2015-12-04 LAB — CBC AND DIFFERENTIAL: WBC: 6.9 10^3/mL

## 2015-12-04 MED ORDER — HYDROCODONE-ACETAMINOPHEN 5-325 MG PO TABS: 1.0000 | ORAL_TABLET | Freq: Four times a day (QID) | ORAL | Status: DC | PRN

## 2015-12-04 MED ORDER — CLOPIDOGREL BISULFATE 75 MG PO TABS: 75.0000 mg | ORAL_TABLET | Freq: Every day | ORAL | Status: DC

## 2015-12-04 MED ORDER — WARFARIN SODIUM 2 MG PO TABS: ORAL_TABLET | ORAL | Status: DC

## 2015-12-04 MED ORDER — WARFARIN SODIUM 2 MG PO TABS
4.0000 mg | ORAL_TABLET | Freq: Once | ORAL | Status: AC
  Administered 2015-12-04: 4 mg via ORAL
  Filled 2015-12-04: qty 2

## 2015-12-04 NOTE — Addendum Note (Signed)
Addendum  created 12/04/15 0850 by Mickel Baas, CRNA   Modules edited: Charges VN

## 2015-12-04 NOTE — Clinical Social Work Placement (Signed)
   CLINICAL SOCIAL WORK PLACEMENT  NOTE  Date:  12/04/2015  Patient Details  Name: Courtney [name] MRN: [id number] Date of Birth: May 31, 1929  Clinical Social Work is seeking post-discharge placement for this patient at the [place] level of care (*CSW will initial, date and re-position this form in  chart as items are completed):  Yes   Patient/family provided with [place] Work Department's list of facilities offering this level of care within the geographic area requested by the patient (or if unable, by the patient's family).  Yes   Patient/family informed of their freedom to choose among providers that offer the needed level of care, that participate in Medicare, Medicaid or managed care program needed by the patient, have an available bed and are willing to accept the patient.  Yes   Patient/family informed of [facility]'s ownership interest in [facility] and [facility], as well as of the fact that they are under no obligation to receive care at these facilities.  PASRR submitted to EDS on 11/30/15     PASRR number received on 11/30/15     Existing PASRR number confirmed on       FL2 transmitted to all facilities in geographic area requested by pt/family on 11/30/15     FL2 transmitted to all facilities within larger geographic area on       Patient informed that his/her managed care company has contracts with or will negotiate with certain facilities, including the following:        Yes   Patient/family informed of bed offers received.  Patient chooses bed at [facility]     Physician recommends and patient chooses bed at      Patient to be transferred to [facility] on 12/04/15.  Patient to be transferred to facility by staff     Patient family notified on 12/04/15 of transfer.  Name of family member notified:  David-son     PHYSICIAN       Additional Comment:     _______________________________________________ Salome Arnt, LCSW 12/04/2015, 2:51 PM [phone]

## 2015-12-04 NOTE — Addendum Note (Signed)
Addendum  created 12/04/15 0848 by Mickel Baas, CRNA   Modules edited: Charges VN

## 2015-12-04 NOTE — Progress Notes (Signed)
Physical Therapy Treatment Patient Details Name: Courtney [name] MRN: [id number] DOB: 05/09/1929 Today's Date: 12/04/2015    History of Present Illness Courtney [name] is an 82yo white female who sustained a fall at home with immediate pain, and came to [facility] found to have L hip fracture. Pt presents today at POD2 s/p IM nailing. At baseline, pt is an active community dweller, fully independent.     PT Comments    Pt received in bed after getting cleaned up by nursing aide.  Pt was agreeable to PT tx.  Pt's son called, and was inquiring about pt's d/c plans.  PT deferred answer to RN.  Pt continues to require Max A for supine<>sit transfer, however she is able to participate more and more with the transfer each time.  Pt also required Max A for sit<>stand transfer, however, today she was able to perform it with a RW, and also able to perform stand pivot transfer with Max A and RW over to the chair.  Pt is fatigued at the end of the tx session.  Continue to recommend SNF at d/c to improve pt's balance, strength and endurance.   Follow Up Recommendations  SNF     Equipment Recommendations       Recommendations for Other Services       Precautions / Restrictions Precautions Precautions: Fall Restrictions Weight Bearing Restrictions: Yes LLE Weight Bearing: Weight bearing as tolerated    Mobility  Bed Mobility Overal bed mobility: Needs Assistance Bed Mobility: Supine to Sit     Supine to sit: Max assist;HOB elevated     General bed mobility comments: HOB raised.  Assist required for pt to advance L LE towards the EOB, but she was able to shift the R LE, and her shoulders.  Assist required for pt to raise trunk up off bed, and then cues to use UE's behind her to prop herself up.  Bed pad utilized to assist pt's hips to the EOB  Transfers Overall transfer level: Needs assistance Equipment used: Rolling walker (2 wheeled) Transfers: Sit to/from [facility] Sit to Stand: Max  assist;From elevated surface Stand pivot transfers: Max assist       General transfer comment: Pt required vc's for hand placement on the RW.  On initial trial, pt expressed fear and pain in her knees, and did not come into st anding.  2nd trial, pt was able to come into st anding with Max A.  Pt then able to perform lateral weight shifting and demonstrated ability to advance her feet to pivot over to the chair.  Pt education on bearing weight through her hands and not forearms on the RW.   Ambulation/Gait                 Stairs            Wheelchair Mobility    Modified Rankin (Stroke Patients Only)       Balance           Standing balance support: Bilateral upper extremity supported Standing balance-Leahy Scale: Fair                      Cognition Arousal/Alertness: Awake/alert Behavior During Therapy: WFL for tasks assessed/performed Overall Cognitive Status: History of cognitive impairments - at baseline       Memory: Decreased short-term memory              Exercises Total Joint Exercises Ankle Circles/Pumps: AAROM;Both;10 reps;Supine Quad  Sets: 5 reps;Strengthening;Supine;Other (comment) (Attempted - poor quality) Heel Slides: AAROM;Left;10 reps;Other (comment) (10 reps on the right)    General Comments        Pertinent Vitals/Pain Pain Assessment: 0-10 Pain Score: 2  Pain Location: L hip - 2/10 at rest, but increases to 8/10 during mobility, and returns back to a 2/10 at the end of tx.  Pain Intervention(s): Limited activity within patient's tolerance;Monitored during session;Repositioned;Relaxation;Utilized relaxation techniques    Home Living                      Prior Function            PT Goals (current goals can now be found in the care plan section) Acute Rehab PT Goals Patient Stated Goal: Regain indep in ambulaiton/IADL PT Goal Formulation: With patient Time For Goal Achievement: 12/16/15 Potential to  Achieve Goals: Fair Progress towards PT goals: Progressing toward goals    Frequency  BID    PT Plan Current plan remains appropriate    Co-evaluation             End of Session Equipment Utilized During Treatment: Gait belt Activity Tolerance: Patient limited by pain;Patient tolerated treatment well;Patient limited by fatigue Patient left: in chair;with nursing/sitter in room;with call bell/phone within reach     Time: 1105-1139 PT Time Calculation (min) (ACUTE ONLY): 34 min  Charges:  $Therapeutic Exercise: 8-22 mins $Therapeutic Activity: 8-22 mins                    G Codes:      Beth Octavian Godek, PT, DPT X: [id number]   12/04/2015, 12:50 PM

## 2015-12-04 NOTE — Progress Notes (Addendum)
[place] for heparin and coumadin Indication: MVR  Allergies  Allergen Reactions  . Penicillins Swelling    Has patient had a PCN reaction causing immediate rash, facial/tongue/throat swelling, SOB or lightheadedness with hypotension: Yes Has patient had a PCN reaction causing severe rash involving mucus membranes or skin necrosis: No Has patient had a PCN reaction that required hospitalization No Has patient had a PCN reaction occurring within the last 10 years: No If all of the above answers are "NO", then may proceed with Cephalosporin use. Whole body "swelled up" and had to go to the hospital   Patient Measurements: Height: 5\' 6"  (167.6 cm) Weight: 160 lb 0.9 oz (72.6 kg) IBW/kg (Calculated) : 59.3 Heparin Dosing Weight: 59.9 kg  Vital Signs: Temp: 98.8 F (37.1 C) (05/01 0505) Temp Source: Oral (05/01 0505) BP: 140/68 mmHg (05/01 0505) Pulse Rate: 72 (05/01 0505)  Labs:  Recent Labs  12/02/15 0628 12/03/15 0611 12/03/15 1845 12/04/15 0503  HGB 10.6* 10.3*  --  10.1*  HCT 32.5* 31.6*  --  30.9*  PLT 111* 140*  --  164  LABPROT 23.8* 23.2*  --  23.0*  INR 2.15* 2.08*  --  2.05*  HEPARINUNFRC  --   --  2.02* <0.10*  CREATININE 0.81  --   --  0.88   Estimated Creatinine Clearance: 47.7 mL/min (by C-G formula based on Cr of 0.88).  Medical History: Past Medical History  Diagnosis Date  . Stroke ([place])   . Hypertension   . Vascular dementia   . Hypothyroidism   . History of mitral valve replacement   . UTI (lower urinary tract infection)   . Permanent atrial fibrillation ([place])   . Chronic anticoagulation     coumadin, CHADS2VASC=6  . Biatrial enlargement 12/01/2015  . Pulmonary hypertension, moderate to severe ([place]) 12/01/2015   Medications:  Prescriptions prior to admission  Medication Sig Dispense Refill Last Dose  . amLODipine (NORVASC) 2.5 MG tablet Take 2.5 mg by mouth daily.   11/29/2015 at Unknown time  .  atenolol (TENORMIN) 25 MG tablet Take 12.5 mg by mouth.   11/29/2015 at Unknown time  . b complex vitamins tablet Take 1 tablet by mouth daily.   11/29/2015 at Unknown time  . clopidogrel (PLAVIX) 75 MG tablet Take 75 mg by mouth daily.   11/29/2015 at Unknown time  . levothyroxine (SYNTHROID, LEVOTHROID) 125 MCG tablet Take 125 mcg by mouth daily before breakfast.   11/29/2015 at Unknown time  . lisinopril (PRINIVIL,ZESTRIL) 2.5 MG tablet Take 2.5 mg by mouth daily.   11/29/2015 at Unknown time  . triamterene-hydrochlorothiazide (DYAZIDE) 37.5-25 MG capsule Take 1 capsule by mouth daily.   11/29/2015 at Unknown time  . vitamin C (ASCORBIC ACID) 500 MG tablet Take 500 mg by mouth 2 (two) times daily.   11/29/2015 at Unknown time  . warfarin (COUMADIN) 2 MG tablet Take 2 mg by mouth daily. Pt takes 2mg  one day and 4mg  the next   11/28/2015 at 1700  . acetaminophen (TYLENOL) 500 MG tablet Take 500 mg by mouth every 6 (six) hours as needed for mild pain.   unknown  . ciprofloxacin (CIPRO) 500 MG tablet Take 1 tablet (500 mg total) by mouth 2 (two) times daily. Antibiotic be taken for 3 more days, starting tomorrow. (Patient not taking: Reported on 11/29/2015) 6 tablet 0   . docusate sodium (COLACE) 100 MG capsule Take 100 mg by mouth 2 (two) times daily.  unknown  . ibandronate (BONIVA) 150 MG tablet Take 150 mg by mouth every 30 (thirty) days.  0 2 weeks   Assessment: 82 yo lady on heparin for MVR. She broke her hip and coumadin was held anticipation for surgery. She is now POD #3 and heparin was resumed. Coumadin resumed yesterday. INR 2.05. No bleeding complications noted Heparin level is subtherapeutic <0.1.  Heparin was  to be held for 1 hr last night, was not restarted.   Goal of Therapy:  Heparin level 0.3-0.7 units/ml INR 2.5-3.5 Monitor platelets by anticoagulation protocol: Yes     Plan:  Coumadin 4mg  today x 1  Increase heparin at 1050 units/hr Recheck HL in 8 hrs Daily INR, heparin  level and CBC Monitor for bleeding complications  Isac Sarna, BS Vena Austria, BCPS Clinical Pharmacist Pager [phone] 12/04/2015,7:48 AM

## 2015-12-04 NOTE — Progress Notes (Signed)
Patient discharged to [facility].  Packet with prescription sent with NT.

## 2015-12-04 NOTE — Discharge Summary (Signed)
Physician Discharge Summary  Courtney [name] [id number] DOB: Apr 02, 1929 DOA: 11/29/2015  PCP: M.D. in [place] date: 11/29/2015 Discharge date: 12/04/2015  Time spent: Greater than 30 minutes  Recommendations for Outpatient Follow-up:  1. Per orthopedic surgeon, Dr. Aline Brochure: Weightbearing as tolerated; appropriate monitoring of her hemoglobin; take staples out in 2 weeks; follow-up in his office in 2 weeks after discharge; recommend x-rays at 2 weeks, 6 weeks, and 12 weeks. 2. Patient will need an appointment to follow-up with Dr. Aline Brochure in 2 weeks. 3. Recommend continued monitoring of her INR/PTT.  4. Recommend follow-up TSH and free T4 in 3-6 months. 5. Patient was discharged to the [facility] for rehabilitation.   Discharge Diagnoses:  1. Fall at home resulting in an acute intratrochanteric fracture of the left hip. -Status post operative repair with intramedullary nail performed on 12/01/15, Dr. Aline Brochure. 2. Chronic anticoagulation secondary to mitral valve replacement and chronic atrial fibrillation. 3. Severe biatrial enlargement and severe pulmonary hypertension with preserved left ventricular systolic function, per echo. 4. Thrombocytopenia, possibly secondary to platelet aggregation on IV heparin. 5. Mild dehydration. 6. Essential hypertension. 7. Hypothyroidism. 8. Vascular dementia.   Discharge Condition: Improved  Diet recommendation: Heart healthy  Filed Weights   12/01/15 1412 12/02/15 0634 12/04/15 0505  Weight: 59.875 kg (132 lb) 68.4 kg (150 lb 12.7 oz) 72.6 kg (160 lb 0.9 oz)    History of present illness:  Patient is an 82 year old woman with a history of mitral valve replacement on Coumadin, chronic atrial fibrillation, vascular dementia from a previous stroke, hypothyroidism, and hypertension. She presented to the ED on 11/29/2015 after falling at home. She complained of left hip pain. In the ED, she was afebrile and hemodynamically stable. Her lab  data were significant for BUN of 24, creatinine of 1.01, and INR of 2.3. Pelvic and hip x-ray revealed an acute slightly angulated intertrochanteric fracture of the left hip with no pelvic fracture observed. Her chest x-ray revealed no acute findings. She was admitted for evaluation and management of acute left hip fracture.  Hospital Course:  1. Fall resulting in acute intratrochanteric fracture of the left hip. Patient was started on analgesics as needed for pain. Orthopedic surgeon, Dr. Aline Brochure was consulted. He requested a cardiology consult regarding whether the patient's chronic anticoagulation should be bridged or not. In the meantime, Coumadin was withheld and she was started on a heparin drip. Cardiologist, Dr. Jacinta Shoe agreed with perioperative heparin and deferred restarting Coumadin when feasible to orthopedic surgery. -Operative repair with intramedullary nail intratrochanteric left hip fracture, performed on 12/01/15. - Coumadin restarted on 12/02/15 for anticoagulation. -Physical therapy ordered per Dr. Aline Brochure. PT recommended skilled nursing facility placement for rehabilitation. -Recommendations for outpatient therapy and appropriate follow-up were dictated above.  Chronic anticoagulation secondary to mitral valve replacement and chronic atrial fibrillation. Patient is treated chronically with Coumadin for anticoagulation and atenolol for rate control. -As above, Coumadin was withheld and she was bridged with heparin. Atenolol was restarted. Her rate has been controlled. -Cardiology opined and ordered a 2-D echocardiogram to evaluate her LV function and mitral valve prosthesis. Of note, an old nuclear stress test was reviewed by cardiology and it showed myocardial scar, no ischemia, indicative of CAD. -Echo revealed an EF of 60-65%, normal functioning mechanical mitral valve; severe left atrial and massive right atrial enlargement with severe pulmonary hypertension. - Coumadin  restarted on 12/02/15 following Dr. Ruthe Mannan recommendation. Heparin drip was restarted for a slightly subtherapeutic INR. Restarting Plavix (for treatment of  vascular dementia) was postponed in the setting of Coumadin and the heparin drip.  -Per my discussion with pharmacy, the patient was given 5 mg of Coumadin on 12/03/15. She should be given 4 mg tonight and then restarted on her normal regimen of 4 mg alternating with 2 mg daily.  Severe biatrial enlargement and severe pulmonary hypertension with preserved left ventricle systolic function. -Noted per echo.  Thrombocytopenia. Patient's platelet count was within normal limits on admission. It decreased significantly to 115 and then to 109 following the start of heparin. HIT panel ordered and was apparently negative. Heparin was temporarily discontinued during the operation. Her platelet count improved and normalized at 164. The possible etiology of the thrombocytopenia was platelet aggregation on heparin, but not antibody induced.  Hypertension. Patient is treated chronically with amlodipine, lisinopril, Dyazide, and atenolol. On admission, her blood pressure was on the lower side of normal, so Dyazide was withheld. Her blood pressure has trended up , but still not malignant. Will adjust doses of medications if needed following surgery.  Mild dehydration. On admission, patient's BUN was 25 and her creatinine was 1.01. It was felt that she was mildly dehydrated from the fallen from Dyazide. She was started on gentle IV fluids and Dyazide was withheld. Her BUN and creatinine have improved. -Dyazide was restarted at discharge due to her blood pressure trending up. Would recommend close monitoring of her renal function.  Hypothyroidism. Patient was continued on Synthroid. TSH was within normal limits at 0.39 and free T4 was slightly elevated at 1.8. -Synthroid dose was continued without change. Patient will need follow-up TSH and free T4 in 3-6  months for reevaluation.    Procedures: 1. Left hip operative repair-intramedullary nail intertrochanteric left hip fracture, Dr. Aline Brochure, 12/01/15  2. Echo 11/30/2015: - Left ventricle: The cavity size was normal. Wall thickness was  increased in a pattern of mild LVH. Systolic function was normal.  The estimated ejection fraction was in the range of 60% to 65%.  Wall motion was normal; there were no regional wall motion  abnormalities. The study was not technically sufficient to allow  evaluation of LV diastolic dysfunction due to atrial  fibrillation. - Aortic valve: Mildly to moderately calcified annulus. Trileaflet;  mildly thickened, mildly calcified leaflets. Possibly very mild  aortic stenosis. There was mild regurgitation. - Mitral valve: [employer] mechanical mitral valve noted, which  appears to be functioning normally. - Left atrium: The atrium was severely dilated. - Right ventricle: The cavity size was mildly dilated. - Right atrium: The atrium was massively dilated. - Atrial septum: No defect or patent foramen ovale was identified. - Tricuspid valve: Mildly thickened leaflets. There was  moderate-severe regurgitation. - Pulmonary arteries: Systolic pressure was severely increased. PA  peak pressure: 65 mm Hg (S). - Inferior vena cava: The vessel was dilated. The respirophasic  diameter changes were blunted (< 50%), consistent with elevated   central venous pressure.  Consultations:  Orthopedic surgery, Dr. Aline Brochure  Cardiology  Discharge Exam: Filed Vitals:   12/04/15 0505 12/04/15 0937  BP: 140/68 130/60  Pulse: 72 81  Temp: 98.8 F (37.1 C)   Resp: 18 18    Respiratory system: Clear to auscultation. Respiratory effort normal. Cardiovascular system: Irregular, irregular, with systolic click. No pedal edema. Gastrointestinal system: Abdomen is nondistended, soft and nontender. No organomegaly or masses felt. Normal bowel sounds  heard. Central nervous system: Alert and oriented to herself, and hospital. Extremities: Left hip postoperative site with bandage in place. Patient  able to wiggle toes on both feet. No pedal edema.  Skin: No rashes, lesions or ulcers Psychiatry: Pleasant affect. Speech is clear. Occasionally pleasantly confused.  Discharge Instructions   Discharge Instructions    Diet - low sodium heart healthy    Complete by:  As directed      Increase activity slowly    Complete by:  As directed           Current Discharge Medication List    START taking these medications   Details  HYDROcodone-acetaminophen (NORCO/VICODIN) 5-325 MG tablet Take 1 tablet by mouth every 6 (six) hours as needed for moderate pain. Qty: 30 tablet, Refills: 0      CONTINUE these medications which have CHANGED   Details  clopidogrel (PLAVIX) 75 MG tablet Take 1 tablet (75 mg total) by mouth daily. Start 12/05/15    warfarin (COUMADIN) 2 MG tablet 4 mg alternating with 2 mg daily. Start with 4 mg dose this evening on 12/04/15.      CONTINUE these medications which have NOT CHANGED   Details  amLODipine (NORVASC) 2.5 MG tablet Take 2.5 mg by mouth daily.    atenolol (TENORMIN) 25 MG tablet Take 12.5 mg by mouth.    b complex vitamins tablet Take 1 tablet by mouth daily.    levothyroxine (SYNTHROID, LEVOTHROID) 125 MCG tablet Take 125 mcg by mouth daily before breakfast.    lisinopril (PRINIVIL,ZESTRIL) 2.5 MG tablet Take 2.5 mg by mouth daily.    triamterene-hydrochlorothiazide (DYAZIDE) 37.5-25 MG capsule Take 1 capsule by mouth daily.    vitamin C (ASCORBIC ACID) 500 MG tablet Take 500 mg by mouth 2 (two) times daily.    acetaminophen (TYLENOL) 500 MG tablet Take 500 mg by mouth every 6 (six) hours as needed for mild pain.    docusate sodium (COLACE) 100 MG capsule Take 100 mg by mouth 2 (two) times daily.    ibandronate (BONIVA) 150 MG tablet Take 150 mg by mouth every 30 (thirty) days. Refills: 0       STOP taking these medications     ciprofloxacin (CIPRO) 500 MG tablet        Allergies  Allergen Reactions  . Penicillins Swelling    Has patient had a PCN reaction causing immediate rash, facial/tongue/throat swelling, SOB or lightheadedness with hypotension: Yes Has patient had a PCN reaction causing severe rash involving mucus membranes or skin necrosis: No Has patient had a PCN reaction that required hospitalization No Has patient had a PCN reaction occurring within the last 10 years: No If all of the above answers are "NO", then may proceed with Cephalosporin use. Whole body "swelled up" and had to go to the hospital      The results of significant diagnostics from this hospitalization (including imaging, microbiology, ancillary and laboratory) are listed below for reference.    Significant Diagnostic Studies: Dg Chest 1 View  11/29/2015  CLINICAL DATA:  Fall, left hip pain EXAM: CHEST 1 VIEW COMPARISON:  07/12/2015 FINDINGS: Cardiomegaly evident with the prior mitral valve replacement. Stable vascular congestion and hyperinflation. No focal pneumonia, collapse or consolidation. Negative for edema, effusion or pneumothorax. Trachea midline. Aortic atherosclerosis evident. Bones are osteopenic. Degenerative changes noted of the spine. IMPRESSION: Stable cardiomegaly and postoperative findings. No interval change or superimposed acute process. Electronically Signed   By: Jerilynn Mages.  Shick M.D.   On: 11/29/2015 09:20   Dg Hip Operative Unilat With Pelvis Left  12/01/2015  CLINICAL DATA:  Left hip fracture.  EXAM: OPERATIVE LEFT HIP (WITH PELVIS IF PERFORMED)  VIEWS TECHNIQUE: Fluoroscopic spot image(s) were submitted for interpretation post-operatively. COMPARISON:  None. FINDINGS: Open reduction internal fixation left hip with good anatomic alignment. Hardware intact . IMPRESSION: ORIF left hip. Electronically Signed   By: Marcello Moores  Register   On: 12/01/2015 17:16   Dg Hip Unilat With Pelvis  2-3 Views Left  11/29/2015  CLINICAL DATA:  Status post fall in the bathroom this morning now complaining of pain in the upper left thigh EXAM: DG HIP (WITH OR WITHOUT PELVIS) 2-3V LEFT COMPARISON:  None in PACs FINDINGS: The bones are osteopenic. There is an acute mildly angulated fracture of the intertrochanteric region of the left hip. The left acetabulum and pubic rami appear intact as does the iliac crest. The right hemipelvis and right hip are unremarkable. IMPRESSION: There is an acute slightly angulated intertrochanteric fracture of the left hip. No pelvic fracture is observed. Electronically Signed   By: David  Martinique M.D.   On: 11/29/2015 09:18    Microbiology: Recent Results (from the past 240 hour(s))  Surgical pcr screen     Status: Abnormal   Collection Time: 11/30/15  9:08 AM  Result Value Ref Range Status   MRSA, PCR NEGATIVE NEGATIVE Final   Staphylococcus aureus POSITIVE (A) NEGATIVE Final    Comment:        The Xpert SA Assay (FDA approved for NASAL specimens in patients over 63 years of age), is one component of a comprehensive surveillance program.  Test performance has been validated by [facility] for patients greater than or equal to 91 year old. It is not intended to diagnose infection nor to guide or monitor treatment. RESULT CALLED TO, READ BACK BY AND VERIFIED WITH: MAYS,J. AT 1304 ON 11/30/2015 BY BAUGHAM,M.      Labs: Basic Metabolic Panel:  Recent Labs Lab 11/29/15 0909 11/30/15 0525 12/01/15 0600 12/02/15 0628 12/04/15 0503  NA 138 138 138 135 136  K 3.7 3.9 4.0 4.2 4.1  CL 101 105 106 104 102  CO2 29 24 25 26 28   GLUCOSE 92 102* 112* 99 101*  BUN 24* 22* 15 15 21*  CREATININE 1.01* 0.93 0.81 0.81 0.88  CALCIUM 10.0 9.1 9.1 8.9 9.0   Liver Function Tests:  Recent Labs Lab 11/30/15 0525  AST 20  ALT 15  ALKPHOS 52  BILITOT 1.2  PROT 5.8*  ALBUMIN 3.7   No results for input(s): LIPASE, AMYLASE in the last 168 hours. No results  for input(s): AMMONIA in the last 168 hours. CBC:  Recent Labs Lab 11/29/15 0909  12/01/15 0600 12/01/15 0810 12/02/15 0628 12/03/15 0611 12/04/15 0503  WBC 7.8  < > 6.9 6.8 7.3 7.4 6.9  NEUTROABS 6.6  --   --   --  5.4  --   --   HGB 12.5  < > 11.0* 11.2* 10.6* 10.3* 10.1*  HCT 38.6  < > 33.2* 33.3* 32.5* 31.6* 30.9*  MCV 85.6  < > 84.7 84.5 85.5 85.6 86.1  PLT 168  < > 115* 109* 111* 140* 164  < > = values in this interval not displayed. Cardiac Enzymes: No results for input(s): CKTOTAL, CKMB, CKMBINDEX, TROPONINI in the last 168 hours. BNP: BNP (last 3 results) No results for input(s): BNP in the last 8760 hours.  ProBNP (last 3 results) No results for input(s): PROBNP in the last 8760 hours.  CBG: No results for input(s): GLUCAP in the last 168 hours.  Signed:  Vannessa Godown MD.  [facility] 12/04/2015, 1:32 PM

## 2015-12-04 NOTE — Care Management Important Message (Signed)
Important Message  Patient Details  Name: Courtney [name] MRN: [id number] Date of Birth: 1929/01/12   Medicare Important Message Given:  Yes    Alvie Heidelberg, RN 12/04/2015, 1:48 PM

## 2015-12-04 NOTE — Progress Notes (Signed)
Report called to Charlsie Quest, nurse at [facility].  Patient to discharge to room [address].

## 2015-12-04 NOTE — NC FL2 (Signed)
[place] LEVEL OF CARE SCREENING TOOL     IDENTIFICATION  Patient Name: Courtney [name] Birthdate: January 12, 1929 Sex: female Admission Date (Current Location): 11/29/2015  [facility] and [place] Number:  [employer] and Address:  [facility] [address], [place]      Provider Number: [id number]  Attending Physician Name and Address:  Rexene Alberts, MD  Relative Name and Phone Number:       Current Level of Care: Hospital Recommended Level of Care: [place] Prior Approval Number:    Date Approved/Denied:   PASRR Number: [id number]  Discharge Plan: SNF    Current Diagnoses: Patient Active Problem List   Diagnosis Date Noted  . Biatrial enlargement 12/01/2015  . Pulmonary hypertension, moderate to severe ([place]) 12/01/2015  . Closed fracture of intertrochanteric section of femur ([place])   . Chronic atrial fibrillation ([place]) 11/30/2015  . Closed left hip fracture ([place]) 11/29/2015  . Fall at home 11/29/2015  . Hypothyroidism 11/29/2015  . Urinary tract infectious disease   . Nausea and vomiting 07/13/2015  . Thrombocytopenia ([place]) 07/13/2015  . Chronic anticoagulation 07/13/2015  . H/O mitral valve replacement 07/13/2015  . AKI (acute kidney injury) ([place]) 07/13/2015  . Fever 07/12/2015  . UTI (lower urinary tract infection) 07/12/2015  . Dehydration 07/12/2015  . Vascular dementia 07/12/2015  . Essential hypertension 07/12/2015    Orientation RESPIRATION BLADDER Height & Weight     Self, Time, Situation, Place  Normal Incontinent Weight: 160 lb 0.9 oz (72.6 kg) Height:  5\' 6"  (167.6 cm)  BEHAVIORAL SYMPTOMS/MOOD NEUROLOGICAL BOWEL NUTRITION STATUS  Other (Comment) (n/a)  (n/a) Incontinent Diet (low sodium heart healthy)  AMBULATORY STATUS COMMUNICATION OF NEEDS Skin   Total Care Verbally Skin abrasions                       Personal Care Assistance Level of Assistance  Bathing, Feeding, Dressing  Bathing Assistance: Maximum assistance Feeding assistance: Limited assistance Dressing Assistance: Maximum assistance     Functional Limitations Info  Sight, Hearing, Speech Sight Info: Adequate Hearing Info: Impaired Speech Info: Impaired    SPECIAL CARE FACTORS FREQUENCY  PT (By licensed PT)                    Contractures Contractures Info: Not present    Additional Factors Info  Code Status, Allergies Code Status Info: Full code Allergies Info: Penicillins           Current Medications (12/04/2015):  This is the current hospital active medication list Current Facility-Administered Medications  Medication Dose Route Frequency Provider Last Rate Last Dose  . 0.9 %  sodium chloride infusion   Intravenous Continuous Rexene Alberts, MD 40 mL/hr at 12/02/15 1751    . acetaminophen (TYLENOL) tablet 650 mg  650 mg Oral Q6H PRN Rexene Alberts, MD       Or  . acetaminophen (TYLENOL) suppository 650 mg  650 mg Rectal Q6H PRN Rexene Alberts, MD      . albuterol (PROVENTIL) (2.5 MG/3ML) 0.083% nebulizer solution 2.5 mg  2.5 mg Nebulization Q2H PRN Rexene Alberts, MD      . amLODipine (NORVASC) tablet 2.5 mg  2.5 mg Oral Daily Rexene Alberts, MD   2.5 mg at 12/04/15 [id number]  . atenolol (TENORMIN) tablet 12.5 mg  12.5 mg Oral Daily Rexene Alberts, MD   12.5 mg at 12/04/15 [id number]  . Chlorhexidine Gluconate Cloth 2 % PADS 6  each  6 each Topical Daily Rexene Alberts, MD   6 each at 12/03/15 1100  . docusate sodium (COLACE) capsule 100 mg  100 mg Oral BID Rexene Alberts, MD   100 mg at 12/04/15 0939  . heparin ADULT infusion 100 units/mL (25000 units/250 mL)  1,050 Units/hr Intravenous Continuous Rexene Alberts, MD 10.5 mL/hr at 12/04/15 0840 1,050 Units/hr at 12/04/15 0840  . HYDROcodone-acetaminophen (NORCO/VICODIN) 5-325 MG per tablet 1-2 tablet  1-2 tablet Oral Q4H PRN Rexene Alberts, MD   1 tablet at 11/30/15 1857  . levothyroxine (SYNTHROID, LEVOTHROID) tablet 125 mcg  125 mcg Oral QAC breakfast  Rexene Alberts, MD   125 mcg at 12/04/15 [phone]  . lisinopril (PRINIVIL,ZESTRIL) tablet 2.5 mg  2.5 mg Oral Daily Rexene Alberts, MD   2.5 mg at 12/04/15 0937  . morphine 2 MG/ML injection 2 mg  2 mg Intravenous Q2H PRN Rexene Alberts, MD   2 mg at 12/03/15 1053  . mupirocin ointment (BACTROBAN) 2 % 1 application  1 application Nasal BID Rexene Alberts, MD   1 application at [phone]  . ondansetron (ZOFRAN) injection 4 mg  4 mg Intravenous Q6H PRN Hewitt Shorts [place], PA-C   4 mg at 12/02/15 1750  . traMADol (ULTRAM) tablet 50 mg  50 mg Oral Q6H Carole Civil, MD   50 mg at 12/02/15 1212  . vitamin C (ASCORBIC ACID) tablet 500 mg  500 mg Oral BID Rexene Alberts, MD   500 mg at 12/04/15 [id number]  . warfarin (COUMADIN) tablet 4 mg  4 mg Oral Once Rexene Alberts, MD      . Warfarin - Pharmacist Dosing Inpatient   Does not apply [id number] Rexene Alberts, MD   1 each at 12/03/15 1703     Discharge Medications: Please see discharge summary for a list of discharge medications.  Relevant Imaging Results:  Relevant Lab Results:   Additional Information SS#: [id number]  Salome Arnt, [place]

## 2015-12-05 ENCOUNTER — Encounter: Payer: Self-pay | Admitting: Internal Medicine

## 2015-12-05 ENCOUNTER — Other Ambulatory Visit: Payer: Self-pay

## 2015-12-05 ENCOUNTER — Non-Acute Institutional Stay (SKILLED_NURSING_FACILITY): Payer: Medicare Other | Admitting: Internal Medicine

## 2015-12-05 ENCOUNTER — Encounter (HOSPITAL_COMMUNITY)
Admission: RE | Admit: 2015-12-05 | Discharge: 2015-12-05 | Disposition: A | Discharge status: Home / Self Care | Payer: Medicare Other | Source: Skilled Nursing Facility | Attending: Internal Medicine | Admitting: Internal Medicine

## 2015-12-05 DIAGNOSIS — S72142D Displaced intertrochanteric fracture of left femur, subsequent encounter for closed fracture with routine healing: Secondary | ICD-10-CM | POA: Diagnosis not present

## 2015-12-05 DIAGNOSIS — I517 Cardiomegaly: Secondary | ICD-10-CM

## 2015-12-05 DIAGNOSIS — Z7901 Long term (current) use of anticoagulants: Secondary | ICD-10-CM

## 2015-12-05 DIAGNOSIS — M810 Age-related osteoporosis without current pathological fracture: Secondary | ICD-10-CM | POA: Insufficient documentation

## 2015-12-05 DIAGNOSIS — I482 Chronic atrial fibrillation, unspecified: Secondary | ICD-10-CM

## 2015-12-05 DIAGNOSIS — I10 Essential (primary) hypertension: Secondary | ICD-10-CM

## 2015-12-05 DIAGNOSIS — D696 Thrombocytopenia, unspecified: Secondary | ICD-10-CM | POA: Diagnosis not present

## 2015-12-05 DIAGNOSIS — E039 Hypothyroidism, unspecified: Secondary | ICD-10-CM

## 2015-12-05 LAB — BASIC METABOLIC PANEL
Anion gap: 5 (ref 5–15)
BUN: 18 mg/dL (ref 6–20)
CALCIUM: 8.9 mg/dL (ref 8.9–10.3)
CO2: 28 mmol/L (ref 22–32)
Chloride: 105 mmol/L (ref 101–111)
Creatinine, Ser: 0.68 mg/dL (ref 0.44–1.00)
GFR calc Af Amer: >60 mL/min (ref >60)
Glucose, Bld: 96 mg/dL (ref 65–99)
POTASSIUM: 3.8 mmol/L (ref 3.5–5.1)
Sodium: 138 mmol/L (ref 135–145)

## 2015-12-05 LAB — PROTIME-INR
INR: 2.35 — AB (ref 0.00–1.49)
PROTHROMBIN TIME: 25.4 s — AB (ref 11.6–15.2)

## 2015-12-05 MED ORDER — HYDROCODONE-ACETAMINOPHEN 5-325 MG PO TABS: 1.0000 | ORAL_TABLET | Freq: Four times a day (QID) | ORAL | Status: DC | PRN

## 2015-12-05 NOTE — Telephone Encounter (Signed)
Rx faxed to [facility] at [phone].   Phone #: [phone]  [address] [place]  [address]

## 2015-12-05 NOTE — Progress Notes (Signed)
Patient ID: Courtney [name], female   DOB: 05/02/1929, 82 y.o.   MRN: [id number]      This is a comprehensive admission note to [facility] personally performed by Unice Cobble MD on this date less than 30 days from date of admission. Included are preadmission medical/surgical history;reconciled medication list; family history; social history and comprehensive review of systems.  Corrections and additions to the records were documented . Comprehensive physical exam was also performed. Additionally a clinical summary was entered for each active diagnosis pertinent to this admission in the Problem List to enhance continuity of care.  PCP: Dr Gayland Curry in [place], [place]  HPI: She sustained a closed left hip fracture 11/29/15 while walking down a [place] at her son's house here in [place]. She was visiting her son; she lives in [place]. There was no history suggesting a cardio or neuro prodrome prior to the fall. Denied were any change in heart rhythm or rate prior to the event. There was no associated chest pain or shortness of breath . Also specifically denied prior to the episode were headache, limb weakness, tingling, or numbness. No seizure activity noted.  The fall was in the context of osteoporosis for which she is on generic Boniva. She is unsure how long she's been on this oral agent. Perioperatively she demonstrated thrombocytopenia while on heparin. Platelet count was low as 109,000. Off heparin it rose to 164,000.  She has a history of mitral valve replacement and pulmonary hypertension. Pre op Echocardiogram 11/30/15 revealed an ejection fraction of 60-65 percent; severe left atrial enlargement; massive right atrial enlargement. She is on warfarin as well as clopidogrel.The  reason for the latter is unclear; but PMH lists CVA. She has a diagnosis of hypothyroidism; TSH was 0.394 on 12/01/15. She was continued on Synthroid 125 g. Significantly she has chronic atrial fibrillation  by history. She has hypertension and is on a potassium sparing agent triamterene/HCTZ as well as lisinopril. She was discharged to [facility] for rehabilitation.  Past medical and surgical history:Total abdominal hysterectomy; past history of stroke  Social history:Reviewed not be verified;  Family history:No history and chart ; mother and father had heart attacks.  Comprehensive review of systems: Negative except for constipation.  Cardiovascular: No chest pain, palpitations,paroxysmal nocturnal dyspnea, claudication, edema  Respiratory: No cough, sputum production,hemoptysis, DOE , significant snoring,apnea   Gastrointestinal: No heartburn,dysphagia,abdominal pain, nausea / vomiting,rectal bleeding, melena Genitourinary: No dysuria,hematuria, pyuria,  incontinence, nocturia Dermatologic: No rash, pruritus, change in appearance of skin Neurologic: No dizziness,headache,syncope, seizures, numbness , tingling Endocrine: No change in hair/skin/ nails, excessive thirst, excessive hunger, excessive urination  Hematologic/lymphatic: No lymphadenopathy,abnormal bleeding Allergy/immunology: No itchy/ watery eyes, significant sneezing, urticaria, angioedema  Physical exam:  Pertinent or positive findings: She is alert but exhibits signs of dementia. She has an upper plate and lower partial. Heart rate is irregular/irregular. Hyperpigmentation is present over the lower legs bilaterally. She has 1+ pitting edema on the left. Bruising is noted of the dorsum of the hands, right greater than left. She has fusiform change the knees. She has OA  changes in the hands. DIP changes are more prominent than PIP changes. She gave the date as March or April 2012 or 2013. She was unable to identify the president. General appearance:Adequately nourished; no acute distress , increased work of breathing is present.   Lymphatic: No lymphadenopathy about the head, neck, axilla . Eyes: No conjunctival inflammation or  lid edema is present. There is no scleral icterus. Ears:  External ear exam shows no significant lesions or deformities.   Nose:  External nasal examination shows no deformity or inflammation. Nasal mucosa are pink and moist without lesions ,exudates Oral exam: lips and gums are healthy appearing.There is no oropharyngeal erythema or exudate . Neck:  No thyromegaly, masses, tenderness noted.    Heart:  Normal rate and regular rhythm. S1 and S2 normal without gallop, murmur, click, rub .  Lungs:Chest clear to auscultation without wheezes, rhonchi,rales , rubs. Abdomen:Bowel sounds are normal. Abdomen is soft and nontender with no organomegaly, hernias,masses. GU: deferred  Extremities:  No cyanosis, clubbing Strength equal  in upper & lower extremities but generally decreased Balance,Rhomberg,finger to nose testing could not be completed due to clinical state Deep tendon reflexes in UE are equal Skin: Warm & dry w/o tenting. No significant lesions or rash.  See clinical summary under each active problem in the Problem List with associated updated therapeutic plan

## 2015-12-05 NOTE — Assessment & Plan Note (Signed)
Pharmacy consult to assess need for dual therapy as there is increased risk of bleeding with polypharmacy

## 2015-12-05 NOTE — Assessment & Plan Note (Signed)
Monitor potassium and renal function closely

## 2015-12-05 NOTE — Assessment & Plan Note (Signed)
OP  follow-up approximately 12/18/15 for staple removal [facility]

## 2015-12-05 NOTE — Patient Instructions (Addendum)
New orders for [employer] entry. Pharmacy consult as to need for Clopidigrel while on warfarin BMET and PT/INR  12/09/15; no change in warfarin dose Stop Boniva L thyroxine  112 mcg daily in place of L-thyroxine 125 g daily

## 2015-12-05 NOTE — Assessment & Plan Note (Signed)
Monitor PT/INR and platelet count Consult Pharmacy as to need for dual therapy

## 2015-12-05 NOTE — Assessment & Plan Note (Signed)
With history of chronic atrial fibrillation safe TSH goal would be 2-5 Decrease dose and check TSH in 3 months

## 2015-12-09 ENCOUNTER — Other Ambulatory Visit (HOSPITAL_COMMUNITY)
Admission: AD | Admit: 2015-12-09 | Discharge: 2015-12-09 | Disposition: A | Discharge status: Home / Self Care | Payer: Medicare Other | Source: Skilled Nursing Facility | Attending: Internal Medicine | Admitting: Internal Medicine

## 2015-12-09 DIAGNOSIS — C131 Malignant neoplasm of aryepiglottic fold, hypopharyngeal aspect: Secondary | ICD-10-CM | POA: Insufficient documentation

## 2015-12-09 LAB — BASIC METABOLIC PANEL
Anion gap: 8 (ref 5–15)
BUN: 16 mg/dL (ref 6–20)
BUN: 16 mg/dL (ref 4–21)
CALCIUM: 9 mg/dL (ref 8.9–10.3)
CO2: 28 mmol/L (ref 22–32)
CREATININE: 0.86 mg/dL (ref 0.44–1.00)
CREATININE: 0.9 mg/dL (ref <=1.1)
Chloride: 101 mmol/L (ref 101–111)
GFR calc non Af Amer: 60 mL/min — ABNORMAL LOW (ref >60)
Glucose, Bld: 93 mg/dL (ref 65–99)
Glucose: 93 mg/dL
Potassium: 3.8 mmol/L (ref 3.5–5.1)
SODIUM: 137 mmol/L (ref 135–145)
SODIUM: 137 mmol/L (ref 137–147)

## 2015-12-09 LAB — PROTIME-INR
INR: 2.67 — AB (ref 0.00–1.49)
PROTHROMBIN TIME: 28 s — AB (ref 11.6–15.2)

## 2015-12-12 ENCOUNTER — Non-Acute Institutional Stay (SKILLED_NURSING_FACILITY): Payer: Medicare Other | Admitting: Internal Medicine

## 2015-12-12 DIAGNOSIS — R059 Cough, unspecified: Secondary | ICD-10-CM

## 2015-12-12 DIAGNOSIS — R05 Cough: Secondary | ICD-10-CM

## 2015-12-12 DIAGNOSIS — D696 Thrombocytopenia, unspecified: Secondary | ICD-10-CM | POA: Diagnosis not present

## 2015-12-12 NOTE — Progress Notes (Signed)
Patient ID: Courtney [name], female   DOB: Jan 22, 1929, 82 y.o.   MRN: [id number]   This is an acute visit.  Level care skilled.  Facility [facility].  Chief complaint acute visit secondary to cough.  History of present illness.  Patient is a very pleasant 82 year old female who sustained a closed left hip fracture on 11/29/2015.  She did receive a hip replacement.  Very optimally she demonstrated thrombocytopenia on heparin but off heparin a rose into normal range at 164,000.  She has a history of mitral valve replacement pulmonary hypertension.  Echo on 11/30/2015 showed an ejection fraction of 6065% left atrial enlargement massive right atrial enlargement.  She is on Coumadin as well as Plavix updated INR is pending on May 11.  She has chronic A. fib as well.  Patient apparently has developed a productive cough and I'm following up on this.  She is not complaining of any shortness of breath or chest pain she has been afebrile.  Family medical social history as been reviewed include total abdominal hysterectomy and past history of CVA-this was reviewed per admission note on 12/05/2015.  Review of systems.  In general no complaints of fever or chills.  Skin does not complain of rashes or itching.  Head ears eyes nose mouth and throat does not complain of sore throat or nasal discharge or visual changes.  Cardiac does not complain of chest pain.  Muscle skeletal is not complaining of joint pain or hip pain currently.  Neurologic does not complain of dizziness headache or numbness.  Psych does not complain of depression or anxiety currently.  Physical exam.  Temperature is 98.2 pulse 72 respirations 18 blood pressure 138/74.  Gen. this a very pleasant elderly female in no distress lying comfortably in bed.  Her skin is warm and dry.  Eyes pupils appear reactive to light sclerae and conjunctivae are clear I don't see any drainage.  Oropharynx is clear mucous membranes  moist.  Chest largely clear to auscultation except for some slight congestion noticed in the left lower lobe there is no labored breathing.  Heart is regular with occasional irregular beats with a click appreciated status post valve replacement.  She has mild left lower extremity edema.  Edema is fairly minimal of her feet bilaterally.  Abdomen is soft nontender positive bowel sounds.  Musculoskeletal does have arthritic changes of her knees bilaterally as well as of her fingers bilaterally.  Neurologic is grossly intact her speech is clear no lateralizing findings.  Psych she is pleasant and appropriate follows commands without difficulty.  Labs.  12/09/2015.  Sodium 137 potassium 3.8 BUN 16 creatinine 0.86.  INR-2.67.  12/04/2015.  WBC 6.9 hemoglobin 10.1 platelets 164.  Assessment plan.  #1-history of cough-with some mild congestion left lobe again this is fairly minimal but with recent surgery she is at risk for respiratory issues will order a chest x-ray two-view-also will start Mucinex 600 mg twice a day for 5 days and monitor.  Monitor vital signs pulse ox every shift for 48 hours.  #2 thrombocytopenia this had apparently resolved with patient being off heparin she is now on Coumadin for anticoagulation with history of A. fib and valve replacement--update PT/INR has been ordered for May 11-also will update a CBC to keep an eye on her platelets.  [id number]

## 2015-12-14 ENCOUNTER — Encounter (HOSPITAL_COMMUNITY)
Admission: AD | Admit: 2015-12-14 | Discharge: 2015-12-14 | Disposition: A | Discharge status: Home / Self Care | Payer: Medicare Other | Source: Skilled Nursing Facility | Attending: Family Medicine | Admitting: Family Medicine

## 2015-12-14 LAB — CBC WITH DIFFERENTIAL/PLATELET
BASOS ABS: 0 10*3/uL (ref 0.0–0.1)
BASOS PCT: 0 %
EOS PCT: 2 %
Eosinophils Absolute: 0.1 10*3/uL (ref 0.0–0.7)
HEMATOCRIT: 35.7 % — AB (ref 36.0–46.0)
Hemoglobin: 11.4 g/dL — ABNORMAL LOW (ref 12.0–15.0)
LYMPHS PCT: 12 %
Lymphs Abs: 0.8 10*3/uL (ref 0.7–4.0)
MCH: 27 pg (ref 26.0–34.0)
MCHC: 31.9 g/dL (ref 30.0–36.0)
MCV: 84.4 fL (ref 78.0–100.0)
Monocytes Absolute: 0.6 10*3/uL (ref 0.1–1.0)
Monocytes Relative: 10 %
NEUTROS ABS: 4.7 10*3/uL (ref 1.7–7.7)
Neutrophils Relative %: 76 %
PLATELETS: 326 10*3/uL (ref 150–400)
RBC: 4.23 MIL/uL (ref 3.87–5.11)
RDW: 15.5 % (ref 11.5–15.5)
WBC: 6.3 10*3/uL (ref 4.0–10.5)

## 2015-12-14 LAB — PROTIME-INR
INR: 1.65 — AB (ref 0.00–1.49)
PROTHROMBIN TIME: 19.5 s — AB (ref 11.6–15.2)

## 2015-12-14 LAB — CBC AND DIFFERENTIAL: WBC: 6.3 10^3/mL

## 2015-12-15 ENCOUNTER — Ambulatory Visit (HOSPITAL_COMMUNITY)
Admission: RE | Admit: 2015-12-15 | Discharge: 2015-12-15 | Disposition: A | Discharge status: Home / Self Care | Payer: No Typology Code available for payment source | Source: Ambulatory Visit | Attending: Internal Medicine | Admitting: Internal Medicine

## 2015-12-15 DIAGNOSIS — X58XXXD Exposure to other specified factors, subsequent encounter: Secondary | ICD-10-CM | POA: Diagnosis not present

## 2015-12-15 DIAGNOSIS — M25552 Pain in left hip: Secondary | ICD-10-CM | POA: Diagnosis present

## 2015-12-15 DIAGNOSIS — S72002D Fracture of unspecified part of neck of left femur, subsequent encounter for closed fracture with routine healing: Secondary | ICD-10-CM | POA: Diagnosis not present

## 2015-12-16 ENCOUNTER — Encounter: Payer: Self-pay | Admitting: Internal Medicine

## 2015-12-16 DIAGNOSIS — R05 Cough: Secondary | ICD-10-CM | POA: Insufficient documentation

## 2015-12-16 DIAGNOSIS — R059 Cough, unspecified: Secondary | ICD-10-CM | POA: Insufficient documentation

## 2015-12-18 ENCOUNTER — Ambulatory Visit (INDEPENDENT_AMBULATORY_CARE_PROVIDER_SITE_OTHER): Payer: Medicare Other | Admitting: Orthopedic Surgery

## 2015-12-18 ENCOUNTER — Encounter: Payer: Self-pay | Admitting: Orthopedic Surgery

## 2015-12-18 VITALS — BP 82/65 | HR 71 | Ht 64.0 in | Wt 160.0 lb

## 2015-12-18 DIAGNOSIS — S72142D Displaced intertrochanteric fracture of left femur, subsequent encounter for closed fracture with routine healing: Secondary | ICD-10-CM

## 2015-12-18 NOTE — Progress Notes (Signed)
Postop appointment status post left gamma nail staples were taken out today x-rays show perfect position of the gamma nail fracture consolidating well impacted well patient's leg lengths are equal no pain with hip flexion 125 follow-up 4 weeks x-ray short gamma nail

## 2015-12-18 NOTE — Patient Instructions (Addendum)
Xray left hip in 4 weeks  [facility]

## 2015-12-19 ENCOUNTER — Encounter: Payer: Self-pay | Admitting: Internal Medicine

## 2015-12-19 ENCOUNTER — Non-Acute Institutional Stay (SKILLED_NURSING_FACILITY): Payer: Medicare Other | Admitting: Internal Medicine

## 2015-12-19 ENCOUNTER — Encounter (HOSPITAL_COMMUNITY)
Admission: RE | Admit: 2015-12-19 | Discharge: 2015-12-19 | Disposition: A | Discharge status: Home / Self Care | Payer: Medicare Other | Source: Skilled Nursing Facility | Attending: *Deleted | Admitting: *Deleted

## 2015-12-19 DIAGNOSIS — I482 Chronic atrial fibrillation, unspecified: Secondary | ICD-10-CM

## 2015-12-19 DIAGNOSIS — R05 Cough: Secondary | ICD-10-CM

## 2015-12-19 DIAGNOSIS — Z7901 Long term (current) use of anticoagulants: Secondary | ICD-10-CM

## 2015-12-19 DIAGNOSIS — Z5181 Encounter for therapeutic drug level monitoring: Secondary | ICD-10-CM | POA: Diagnosis not present

## 2015-12-19 DIAGNOSIS — R059 Cough, unspecified: Secondary | ICD-10-CM

## 2015-12-19 LAB — PROTIME-INR
INR: 1.8 — ABNORMAL HIGH (ref 0.00–1.49)
Prothrombin Time: 20.8 seconds — ABNORMAL HIGH (ref 11.6–15.2)

## 2015-12-19 NOTE — Progress Notes (Signed)
Location:  [facility] Room Number: 156/D Place of Service:  SNF (31) Provider:  [place] Lewis  No primary care provider on file.  No care team member to display  Extended Emergency Contact Information Primary Emergency Contact: [facility] Address: [address]          [place], [place] [address] [place] Phone: [phone] Relation: Son Secondary Emergency Contact: Sanagustin,Cindy Address: [employer] [place] [address] [place] Phone: [phone] Mobile Phone: [phone] Relation: Other  Code Status:  Full Code Goals of care: Advanced Directive information Advanced Directives 12/19/2015  Does patient have an advance directive? Yes  Does patient want to make changes to advanced directive? No - Patient declined  Copy of advanced directive(s) in chart? Yes  Would patient like information on creating an advanced directive? -     Chief Complaint  Patient presents with  . Acute Visit    Afib,Anticoagulant    HPI:  Pt is a 82 y.o. female seen today for an acute visit for Follow-up of anticoagulation-she does have a history of mitral valve replacement as well as atrial fibrillation currently rate controlled-she also has a left hip fracture repair.  She is currently on Coumadin 2.5 mg on Tuesday Thursdays and Saturdays she is on 4 mg all other days-INR today is 1.8 it was 1.65 previously-there's been no increased bruising or bleeding.  I also recently saw  Her  for cough chest x-ray did not show any acute process she has completed a course of Mucinex that she still has a cough at times although apparently this has improved--she does not complain of any shortness of breath fever or chills   Past Medical History  Diagnosis Date  . Stroke [facility])     on Clopidrigel  . Hypertension   . Vascular dementia   . Hypothyroidism   . History of mitral valve replacement   . UTI (lower urinary tract infection) 07/2015      n&v; AKI  . Permanent atrial fibrillation ([place])   . Chronic anticoagulation     coumadin, CHADS2VASC=6  . Biatrial enlargement 12/01/2015  . Pulmonary hypertension, moderate to severe ([place]) 12/01/2015  . Osteoporosis     on Boniva   Past Surgical History  Procedure Laterality Date  . Mitral valve replacement    . Abdominal hysterectomy    . Intramedullary (im) nail intertrochanteric Left 12/01/2015    Procedure: INTRAMEDULLARY (IM) NAIL INTERTROCHANTRIC;  Surgeon: Carole Civil, MD;  Location: [facility];  Service: Orthopedics;  Laterality: Left;  . Fracture surgery  [id number]    Operative repair with intramedullary nail intratrochanteric Left Hip fracture    Allergies  Allergen Reactions  . Penicillins Swelling    Has patient had a PCN reaction causing immediate rash, facial/tongue/throat swelling, SOB or lightheadedness with hypotension: Yes Has patient had a PCN reaction causing severe rash involving mucus membranes or skin necrosis: No Has patient had a PCN reaction that required hospitalization No Has patient had a PCN reaction occurring within the last 10 years: No If all of the above answers are "NO", then may proceed with Cephalosporin use. Whole body "swelled up" and had to go to the hospital    Current Outpatient Prescriptions on File Prior to Visit  Medication Sig Dispense Refill  . acetaminophen (TYLENOL) 500 MG tablet Take 500 mg by mouth every 6 (six) hours as needed for mild pain.    Marland Kitchen  amLODipine (NORVASC) 2.5 MG tablet Take 2.5 mg by mouth daily.    Marland Kitchen atenolol (TENORMIN) 25 MG tablet Take 12.5 mg by mouth.    Marland Kitchen b complex vitamins tablet Take 1 tablet by mouth daily.    . clopidogrel (PLAVIX) 75 MG tablet Take 1 tablet (75 mg total) by mouth daily. Start 12/05/15    . docusate sodium (COLACE) 100 MG capsule Take 100 mg by mouth 2 (two) times daily.    Marland Kitchen HYDROcodone-acetaminophen (NORCO/VICODIN) 5-325 MG tablet Take 1 tablet by mouth every 6 (six) hours as needed  for moderate pain. 30 tablet 0  . levothyroxine (SYNTHROID, LEVOTHROID) 112 MCG tablet Take 1 tablet (112 mcg total) by mouth daily. 90 tablet 3  . lisinopril (PRINIVIL,ZESTRIL) 2.5 MG tablet Take 2.5 mg by mouth daily.    Marland Kitchen triamterene-hydrochlorothiazide (DYAZIDE) 37.5-25 MG capsule Take 1 capsule by mouth daily.    . vitamin C (ASCORBIC ACID) 500 MG tablet Take 500 mg by mouth 2 (two) times daily.     No current facility-administered medications on file prior to visit.     Review of Systems   General does not complain of any fever or chills.  Skin does not complain of rashes or itching.  Head ears eyes nose mouth and throat does not complain of nasal discharge or sore throat.  Respiratory continues to have a cough at times although apparently this has improved some does not complain of any shortness of breath.  Cardiac no chest pain.  GI is not complaining of any nausea or vomiting diarrhea constipation or abdominal discomfort.  Musculoskeletal is status post left hip fracture with repair but is not complaining of any significant pain at this time.  Neurologic is not complaining of dizziness headache or numbness.  And psych does not complain of depression or anxiety continues to be pleasant   There is no immunization history on file for this patient. Pertinent  Health Maintenance Due  Topic Date Due  . DEXA SCAN  12/18/2016 (Originally 10/11/1995)  . PNA vac Low Risk Adult (1 of 2 - PCV13) 12/18/2016 (Originally 10/11/1995)  . INFLUENZA VACCINE  03/05/2016   No flowsheet data found. Functional Status Survey:    Filed Vitals:   12/19/15 1350  BP: 137/82  Pulse: 65  Temp: 97.8 F (36.6 C)  TempSrc: Oral  Resp: 18  Height: 5\' 4"  (1.626 m)  Weight: 145 lb 12.8 oz (66.134 kg)   Body mass index is 25.01 kg/(m^2). Physical Exam   In general this is a pleasant elderly female in no distress.  Her skin is warm and dry she does have venous stasis changes of her lower  legs bilaterally.  Oropharynx is clear mucous membranes moist.  Eyes pupils appear reactive to light sclera and conjunctiva are clear visual acuity appears grossly intact she has prescription lenses.  Chest is clear to auscultation there is no labored breathing.  Heart is irregular irregular rate and rhythm without murmur gallop or rub she has mild lower extremity edema with venous stasis changes.  Abdomen soft nontender positive bowel sounds.  Muscle skeletal is ambulatory in a wheelchair moves her extremities at baseline again limited left lower extremity status post recent hip surgery.  Neurologic is grossly intact her speech is clear no lateralizing findings.  Psych she continues to be pleasant and appropriate apparently with some confusion at times  Labs reviewed:  INR has noted above  Recent Labs  12/04/15 0503 12/05/15 0725 12/09/15 0445  NA 136  138 137  K 4.1 3.8 3.8  CL 102 105 101  CO2 28 28 28   GLUCOSE 101* 96 93  BUN 21* 18 16  CREATININE 0.88 0.68 0.86  CALCIUM 9.0 8.9 9.0    Recent Labs  07/12/15 1327 07/13/15 0634 11/30/15 0525  AST 33 39 20  ALT 20 30 15   ALKPHOS 67 58 52  BILITOT 1.6* 1.6* 1.2  PROT 6.8 6.1* 5.8*  ALBUMIN 4.0 3.5 3.7    Recent Labs  11/29/15 0909  12/02/15 0628 12/03/15 0611 12/04/15 12/04/15 0503 12/14/15 0720  WBC 7.8  < > 7.3 7.4 6.9 6.9 6.3  NEUTROABS 6.6  --  5.4  --   --   --  4.7  HGB 12.5  < > 10.6* 10.3*  --  10.1* 11.4*  HCT 38.6  < > 32.5* 31.6*  --  30.9* 35.7*  MCV 85.6  < > 85.5 85.6  --  86.1 84.4  PLT 168  < > 111* 140*  --  164 326  < > = values in this interval not displayed. Lab Results  Component Value Date   TSH 0.394 12/01/2015   No results found for: HGBA1C No results found for: CHOL, HDL, LDLCALC, LDLDIRECT, TRIG, CHOLHDL  Significant Diagnostic Results in last 30 days:  Dg Chest 1 View  11/29/2015  CLINICAL DATA:  Fall, left hip pain EXAM: CHEST 1 VIEW COMPARISON:  07/12/2015 FINDINGS:  Cardiomegaly evident with the prior mitral valve replacement. Stable vascular congestion and hyperinflation. No focal pneumonia, collapse or consolidation. Negative for edema, effusion or pneumothorax. Trachea midline. Aortic atherosclerosis evident. Bones are osteopenic. Degenerative changes noted of the spine. IMPRESSION: Stable cardiomegaly and postoperative findings. No interval change or superimposed acute process. Electronically Signed   By: Jerilynn Mages.  Shick M.D.   On: 11/29/2015 09:20   Dg Hip Operative Unilat With Pelvis Left  12/01/2015  CLINICAL DATA:  Left hip fracture. EXAM: OPERATIVE LEFT HIP (WITH PELVIS IF PERFORMED)  VIEWS TECHNIQUE: Fluoroscopic spot image(s) were submitted for interpretation post-operatively. COMPARISON:  None. FINDINGS: Open reduction internal fixation left hip with good anatomic alignment. Hardware intact . IMPRESSION: ORIF left hip. Electronically Signed   By: Marcello Moores  Register   On: 12/01/2015 17:16   Dg Hip Unilat With Pelvis 2-3 Views Left  12/15/2015  CLINICAL DATA:  Intertrochanteric left hip fracture. Subsequent encounter. EXAM: DG HIP (WITH OR WITHOUT PELVIS) 2-3V LEFT COMPARISON:  12/01/2015 FINDINGS: There has been placement of a screw and intramedullary rod transfixing an intertrochanteric left hip fracture in near anatomic alignment. Overlying skin staples noted. Peripheral vascular calcification also seen. IMPRESSION: Internal fixation of intertrochanteric left hip fracture in near anatomic alignment. Electronically Signed   By: Earle Gell M.D.   On: 12/15/2015 16:56   Dg Hip Unilat With Pelvis 2-3 Views Left  11/29/2015  CLINICAL DATA:  Status post fall in the bathroom this morning now complaining of pain in the upper left thigh EXAM: DG HIP (WITH OR WITHOUT PELVIS) 2-3V LEFT COMPARISON:  None in PACs FINDINGS: The bones are osteopenic. There is an acute mildly angulated fracture of the intertrochanteric region of the left hip. The left acetabulum and pubic rami  appear intact as does the iliac crest. The right hemipelvis and right hip are unremarkable. IMPRESSION: There is an acute slightly angulated intertrochanteric fracture of the left hip. No pelvic fracture is observed. Electronically Signed   By: David  Martinique M.D.   On: 11/29/2015 09:18    Assessment/Plan #1  history of atrial fibrillation on chronic anticoagulation-INR is slightly subtherapeutic will give 4 mg of Coumadin tonight and continue with previous dosing 4 mg on Monday Wednesday Friday and Sunday 2.5 mg all other days-recheck PT/INR on Friday, May 19. This appears rate controlled on atenolol  #2 cough will prescribe routine Robitussin every 6 hours routine for 3 days and monitor his C of getting this routinely will help for a few days no shortness of breath no fever no chills this appears to be improving.  [place], [place], [place]

## 2015-12-22 ENCOUNTER — Encounter (HOSPITAL_COMMUNITY)
Admission: RE | Admit: 2015-12-22 | Discharge: 2015-12-22 | Disposition: A | Discharge status: Home / Self Care | Payer: Medicare Other | Source: Skilled Nursing Facility | Attending: *Deleted | Admitting: *Deleted

## 2015-12-22 LAB — PROTIME-INR
INR: 2.35 — AB (ref 0.00–1.49)
Prothrombin Time: 25.5 seconds — ABNORMAL HIGH (ref 11.6–15.2)

## 2015-12-22 LAB — POCT INR: INR: 2.4 — AB (ref <=1.1)

## 2015-12-25 ENCOUNTER — Encounter (HOSPITAL_COMMUNITY)
Admission: RE | Admit: 2015-12-25 | Discharge: 2015-12-25 | Disposition: A | Discharge status: Home / Self Care | Payer: Medicare Other | Source: Skilled Nursing Facility | Attending: *Deleted | Admitting: *Deleted

## 2015-12-25 LAB — PROTIME-INR
INR: 2.31 — AB (ref 0.00–1.49)
PROTHROMBIN TIME: 25.1 s — AB (ref 11.6–15.2)

## 2015-12-26 ENCOUNTER — Encounter: Payer: Self-pay | Admitting: Internal Medicine

## 2015-12-26 ENCOUNTER — Non-Acute Institutional Stay (SKILLED_NURSING_FACILITY): Payer: Medicare Other | Admitting: Internal Medicine

## 2015-12-26 DIAGNOSIS — Z7901 Long term (current) use of anticoagulants: Secondary | ICD-10-CM | POA: Diagnosis not present

## 2015-12-26 DIAGNOSIS — I482 Chronic atrial fibrillation, unspecified: Secondary | ICD-10-CM

## 2015-12-26 DIAGNOSIS — I10 Essential (primary) hypertension: Secondary | ICD-10-CM

## 2015-12-26 DIAGNOSIS — E039 Hypothyroidism, unspecified: Secondary | ICD-10-CM

## 2015-12-26 NOTE — Assessment & Plan Note (Signed)
Rate control is adequate

## 2015-12-26 NOTE — Patient Instructions (Signed)
New orders for [facility] entry 12/06/15:CBC and differential BMET  TSH PT/INR

## 2015-12-26 NOTE — Assessment & Plan Note (Signed)
Blood pressure control is adequate; no change indicated unless cough persists or progresses

## 2015-12-26 NOTE — Assessment & Plan Note (Signed)
PT/INR is stable; no evidence of bleeding dyscrasias PT/INR should be checked every 5-7 days.

## 2015-12-26 NOTE — Assessment & Plan Note (Signed)
TSH 01/02/16

## 2015-12-26 NOTE — Progress Notes (Signed)
Patient ID: Courtney [name], female   DOB: Jan 01, 1929, 82 y.o.   MRN: [id number]    [facility] Room:156  Chief Complaint  Patient presents with  . Medical Management of Chronic Issues    Follow up AFIB   Allergies  Allergen Reactions  . Penicillins Swelling    Has patient had a PCN reaction causing immediate rash, facial/tongue/throat swelling, SOB or lightheadedness with hypotension: Yes Has patient had a PCN reaction causing severe rash involving mucus membranes or skin necrosis: No Has patient had a PCN reaction that required hospitalization No Has patient had a PCN reaction occurring within the last 10 years: No If all of the above answers are "NO", then may proceed with Cephalosporin use. Whole body "swelled up" and had to go to the hospital    This is a nursing facility follow up of chronic medical diagnoses  Interim medical record and care since last [place] visit was updated with review of diagnostic studies and change in clinical status since last visit were documented.  HPI:The patient is on dual prophylactic therapy of A Fib  at this time; warfarin as well as Plavix. The significant past history has been delineated in the problem list. Her PT/INR has been stable over the last 4 days on the  dual therapy. She has a mild anemia with hemoglobin 11.4 & hematocrit 35.7.   Comprehensive review of systems: She denies any active symptoms at this time. She was seen for a cough 12/16/15. She denies active cardiopulmonary symptoms at this time. She is on an ACE inhibitor. Constitutional: No fever,significant weight change, fatigue  Cardiovascular: No chest pain, palpitations,paroxysmal nocturnal dyspnea, claudication, edema  Respiratory: No cough, sputum production,hemoptysis, DOE , significant snoring,apnea   Gastrointestinal: No heartburn,dysphagia,abdominal pain, nausea / vomiting,rectal bleeding, melena,change in bowels Genitourinary: No dysuria,hematuria, pyuria,   incontinence, nocturia Dermatologic: No rash, pruritus, change in appearance of skin Neurologic: No dizziness,headache,syncope, seizures, numbness , tingling Psychiatric: No significant anxiety , depression, insomnia, anorexia Hematologic/lymphatic: No significant bruising, lymphadenopathy,abnormal bleeding despite the dual therapy and intermittent thrombocytopenia  Physical exam:  Pertinent or positive findings: She is alert and communicative. Unfortunately she has dementia manifested by inability to give the month or date. She describes the year as 2020. She has an upper plate and lower partial. Heart rhythm is irregular; rate is well controlled. 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 Pedal pulses are decreased. DTRs are 1/2+ and equal. General appearance:Adequately nourished; no acute distress , increased work of breathing is present.   Lymphatic: No lymphadenopathy about the head, neck, axilla . Eyes: No conjunctival inflammation or lid edema is present. There is no scleral icterus. Ears:  External ear exam shows no significant lesions or deformities.   Nose:  External nasal examination shows no deformity or inflammation. Nasal mucosa are pink and moist without lesions ,exudates Oral exam: lips and gums are healthy appearing.There is no oropharyngeal erythema or exudate . Neck:  No thyromegaly, masses, tenderness noted.    Heart:  No gallop, murmur, click, rub .  Lungs:Chest clear to auscultation without wheezes, rhonchi,rales , rubs. Abdomen:Bowel sounds are normal. Abdomen is soft and nontender with no organomegaly, hernias,masses. GU: deferred as previously addressed. Extremities:  No cyanosis, clubbing,edema  Neurologic exam : Balance,Rhomberg,finger to nose testing could not be completed due to clinical state Skin: Warm & dry w/o tenting. No significant lesions or rash.    See summary under each active problem in the Problem List with associated updated  therapeutic  plan

## 2016-01-02 ENCOUNTER — Encounter (HOSPITAL_COMMUNITY)
Admission: RE | Admit: 2016-01-02 | Discharge: 2016-01-02 | Disposition: A | Discharge status: Home / Self Care | Payer: Medicare Other | Source: Skilled Nursing Facility | Attending: *Deleted | Admitting: *Deleted

## 2016-01-02 LAB — BASIC METABOLIC PANEL
ANION GAP: 5 (ref 5–15)
BUN: 21 mg/dL — ABNORMAL HIGH (ref 6–20)
CO2: 31 mmol/L (ref 22–32)
Calcium: 9.9 mg/dL (ref 8.9–10.3)
Chloride: 101 mmol/L (ref 101–111)
Creatinine, Ser: 0.92 mg/dL (ref 0.44–1.00)
GFR calc Af Amer: >60 mL/min (ref >60)
GFR, EST NON AFRICAN AMERICAN: 55 mL/min — AB (ref >60)
GLUCOSE: 92 mg/dL (ref 65–99)
POTASSIUM: 3.7 mmol/L (ref 3.5–5.1)
Sodium: 137 mmol/L (ref 135–145)

## 2016-01-02 LAB — CBC WITH DIFFERENTIAL/PLATELET
Basophils Absolute: 0 10*3/uL (ref 0.0–0.1)
Basophils Relative: 0 %
Eosinophils Absolute: 0.1 10*3/uL (ref 0.0–0.7)
Eosinophils Relative: 2 %
HEMATOCRIT: 36.8 % (ref 36.0–46.0)
Hemoglobin: 11.6 g/dL — ABNORMAL LOW (ref 12.0–15.0)
LYMPHS ABS: 1 10*3/uL (ref 0.7–4.0)
LYMPHS PCT: 25 %
MCH: 26.5 pg (ref 26.0–34.0)
MCHC: 31.5 g/dL (ref 30.0–36.0)
MCV: 84 fL (ref 78.0–100.0)
MONO ABS: 0.3 10*3/uL (ref 0.1–1.0)
Monocytes Relative: 8 %
NEUTROS ABS: 2.6 10*3/uL (ref 1.7–7.7)
Neutrophils Relative %: 65 %
Platelets: 204 10*3/uL (ref 150–400)
RBC: 4.38 MIL/uL (ref 3.87–5.11)
RDW: 15.4 % (ref 11.5–15.5)
WBC: 4 10*3/uL (ref 4.0–10.5)

## 2016-01-02 LAB — PROTIME-INR
INR: 2.17 — ABNORMAL HIGH (ref 0.00–1.49)
Prothrombin Time: 24 seconds — ABNORMAL HIGH (ref 11.6–15.2)

## 2016-01-02 LAB — TSH: TSH: 0.807 u[IU]/mL (ref 0.350–4.500)

## 2016-01-04 ENCOUNTER — Other Ambulatory Visit: Payer: Self-pay

## 2016-01-04 MED ORDER — HYDROCODONE-ACETAMINOPHEN 5-325 MG PO TABS: 1.0000 | ORAL_TABLET | Freq: Four times a day (QID) | ORAL | Status: DC | PRN

## 2016-01-04 NOTE — Telephone Encounter (Signed)
Rx faxed to [facility] at [phone].   [address] [place], [place] [address]  Phone #: [phone] 

## 2016-01-09 ENCOUNTER — Encounter (HOSPITAL_COMMUNITY)
Admission: AD | Admit: 2016-01-09 | Discharge: 2016-01-09 | Disposition: A | Discharge status: Home / Self Care | Payer: Medicare Other | Source: Skilled Nursing Facility | Attending: Internal Medicine | Admitting: Internal Medicine

## 2016-01-09 DIAGNOSIS — Z4789 Encounter for other orthopedic aftercare: Secondary | ICD-10-CM | POA: Insufficient documentation

## 2016-01-09 DIAGNOSIS — I482 Chronic atrial fibrillation: Secondary | ICD-10-CM | POA: Diagnosis not present

## 2016-01-09 DIAGNOSIS — Z7901 Long term (current) use of anticoagulants: Secondary | ICD-10-CM | POA: Diagnosis present

## 2016-01-09 DIAGNOSIS — E039 Hypothyroidism, unspecified: Secondary | ICD-10-CM | POA: Diagnosis present

## 2016-01-09 DIAGNOSIS — Z9181 History of falling: Secondary | ICD-10-CM | POA: Diagnosis present

## 2016-01-09 LAB — PROTIME-INR
INR: 2.02 — ABNORMAL HIGH (ref 0.00–1.49)
Prothrombin Time: 22.7 seconds — ABNORMAL HIGH (ref 11.6–15.2)

## 2016-01-15 ENCOUNTER — Ambulatory Visit: Payer: Medicare Other | Admitting: Orthopedic Surgery

## 2016-01-15 ENCOUNTER — Encounter: Payer: Self-pay | Admitting: Orthopedic Surgery

## 2016-01-15 ENCOUNTER — Inpatient Hospital Stay (INDEPENDENT_AMBULATORY_CARE_PROVIDER_SITE_OTHER): Payer: Medicare Other

## 2016-01-15 VITALS — BP 107/67 | Ht 64.0 in | Wt 145.0 lb

## 2016-01-15 DIAGNOSIS — S72142D Displaced intertrochanteric fracture of left femur, subsequent encounter for closed fracture with routine healing: Secondary | ICD-10-CM | POA: Diagnosis not present

## 2016-01-15 NOTE — Progress Notes (Signed)
Follow-up postop visit status post internal fixation left hip with short gamma nail for peritrochanteric fracture  Chief Complaint  Patient presents with  . Follow-up    LEFT IM NAIL, DOS  12/01/15   BP 107/67 mmHg  Ht 5\' 4"  (1.626 m)  Wt 145 lb (65.772 kg)  BMI 24.88 kg/m2  Today's x-ray shows fracture consolidation in nicely no implant malfunction's or complications  Approximately 6 weeks post fixation so we can continue with weightbearing as tolerated and follow-up for six-week x-ray

## 2016-01-16 ENCOUNTER — Encounter (HOSPITAL_COMMUNITY)
Admission: RE | Admit: 2016-01-16 | Discharge: 2016-01-16 | Disposition: A | Discharge status: Home / Self Care | Payer: Medicare Other | Source: Skilled Nursing Facility | Attending: Internal Medicine | Admitting: Internal Medicine

## 2016-01-16 DIAGNOSIS — I482 Chronic atrial fibrillation: Secondary | ICD-10-CM | POA: Diagnosis not present

## 2016-01-16 LAB — PROTIME-INR
INR: 2.19 — ABNORMAL HIGH (ref 0.00–1.49)
Prothrombin Time: 24.1 seconds — ABNORMAL HIGH (ref 11.6–15.2)

## 2016-01-23 ENCOUNTER — Encounter: Payer: Self-pay | Admitting: Internal Medicine

## 2016-01-23 ENCOUNTER — Non-Acute Institutional Stay (SKILLED_NURSING_FACILITY): Payer: Medicare Other | Admitting: Internal Medicine

## 2016-01-23 DIAGNOSIS — E038 Other specified hypothyroidism: Secondary | ICD-10-CM | POA: Diagnosis not present

## 2016-01-23 DIAGNOSIS — I10 Essential (primary) hypertension: Secondary | ICD-10-CM | POA: Diagnosis not present

## 2016-01-23 DIAGNOSIS — I482 Chronic atrial fibrillation, unspecified: Secondary | ICD-10-CM

## 2016-01-23 DIAGNOSIS — S72142D Displaced intertrochanteric fracture of left femur, subsequent encounter for closed fracture with routine healing: Secondary | ICD-10-CM

## 2016-01-23 NOTE — Progress Notes (Signed)
Location:  [facility] Room Number: 156/D Place of Service:  SNF [phone]) Provider:  Leeanne Deed, MD  Patient Care Team: Hendricks Limes, MD as PCP - General (Internal Medicine)  Extended Emergency Contact Information Primary Emergency Contact: [facility] Address: [address]          [place], [place] [address] [place] Phone: [phone] Relation: Son Secondary Emergency Contact: Roets,Cindy Address: [place] [address] [place] Phone: [phone] Mobile Phone: [phone] Relation: Other  Code Status:  Full Code Goals of care: Advanced Directive information Advanced Directives 01/23/2016  Does patient have an advance directive? Yes  Type of Advance Directive -  Does patient want to make changes to advanced directive? No - Patient declined  Copy of advanced directive(s) in chart? Yes  Would patient like information on creating an advanced directive? -     Chief Complaint  Patient presents with  . Discharge Note    HPI:  Pt is a 82 y.o. female seen today for Discharge from nursing facility-she had been here for rehabilitation after sustaining a left reamer fracture after a fall-this was surgically repaired-she has been followed by orthopedics has done well with rehabilitation and is weightbearing as tolerated.  She will need PT and OT as an outpatient.  Brother medical issues including atrial fibrillation been well controlled during her stay here she does continue on atenolol for rate control she is on Coumadin INRs have been therapeutic-this will need updating when she goes home by home health and primary care provider notified of results.  She also has a history of mitral valve repair she is on Plavix in addition to the Coumadin for anticoagulation.  In regards hypertension this is been stable she is on Dyazide 3 7. 5-25 milligrams a day as well as low dose Norvasc 2.5  mg a day as well as Lisinopril 2.5 mg a day recent blood pressures 118/74-110/63.  She also has a history hypothyroidism recent TSH of 0.807 showed improvement she is on Synthroid.  Currently she has no acute complaints she is looking forward to going home she will be with her son for a while who lives in [place] actually lives in [place].     Past Medical History  Diagnosis Date  . Stroke [facility])     on Clopidrigel  . Hypertension   . Vascular dementia   . Hypothyroidism   . History of mitral valve replacement   . UTI (lower urinary tract infection) 07/2015    n&v; AKI  . Permanent atrial fibrillation ([place])   . Chronic anticoagulation     coumadin, CHADS2VASC=6  . Biatrial enlargement 12/01/2015  . Pulmonary hypertension, moderate to severe ([place]) 12/01/2015  . Osteoporosis     on Boniva   Past Surgical History  Procedure Laterality Date  . Mitral valve replacement    . Abdominal hysterectomy    . Intramedullary (im) nail intertrochanteric Left 12/01/2015    Procedure: INTRAMEDULLARY (IM) NAIL INTERTROCHANTRIC;  Surgeon: Carole Civil, MD;  Location: [facility];  Service: Orthopedics;  Laterality: Left;  . Fracture surgery  [id number]    Operative repair with intramedullary nail intratrochanteric Left Hip fracture    Allergies  Allergen Reactions  . Penicillins Swelling    Has patient had a PCN reaction causing immediate rash, facial/tongue/throat swelling, SOB or lightheadedness with hypotension: Yes Has patient had a PCN reaction  causing severe rash involving mucus membranes or skin necrosis: No Has patient had a PCN reaction that required hospitalization No Has patient had a PCN reaction occurring within the last 10 years: No If all of the above answers are "NO", then may proceed with Cephalosporin use. Whole body "swelled up" and had to go to the hospital    Current Outpatient Prescriptions on File Prior to Visit  Medication Sig Dispense Refill  .  acetaminophen (TYLENOL) 500 MG tablet Take 500 mg by mouth every 6 (six) hours as needed for mild pain.    Marland Kitchen amLODipine (NORVASC) 2.5 MG tablet Take 2.5 mg by mouth daily.    Marland Kitchen atenolol (TENORMIN) 25 MG tablet Take 12.5 mg by mouth daily.     Marland Kitchen b complex vitamins tablet Take 1 tablet by mouth daily.    . clopidogrel (PLAVIX) 75 MG tablet Take 1 tablet (75 mg total) by mouth daily. Start 12/05/15    . docusate sodium (COLACE) 100 MG capsule Take 100 mg by mouth 2 (two) times daily.    . feeding supplement, ENSURE ENLIVE, (ENSURE ENLIVE) LIQD Take 237 mLs by mouth 2 (two) times daily between meals.     Marland Kitchen HYDROcodone-acetaminophen (NORCO/VICODIN) 5-325 MG tablet Take 1 tablet by mouth every 6 (six) hours as needed for moderate pain. 30 tablet 0  . levothyroxine (SYNTHROID, LEVOTHROID) 112 MCG tablet Take 1 tablet (112 mcg total) by mouth daily. 90 tablet 3  . lisinopril (PRINIVIL,ZESTRIL) 2.5 MG tablet Take 2.5 mg by mouth daily.    Marland Kitchen triamterene-hydrochlorothiazide (DYAZIDE) 37.5-25 MG capsule Take 1 capsule by mouth daily.    . vitamin C (ASCORBIC ACID) 500 MG tablet Take 500 mg by mouth 2 (two) times daily.    Marland Kitchen warfarin (COUMADIN) 1 MG tablet Take 0.5mg  with 2mg  to equal 2.5mg  on Tues, Thurs, and Saturday    . warfarin (COUMADIN) 2 MG tablet Take 2 mg along with 0.5 mg by mouth on Tues,Thu,Sat at 5:00 pm. Take 4 mg by mouth on Sun,Mon,Fri at 5:00 pm     No current facility-administered medications on file prior to visit.    Review of Systems   In general does not complain of any fever or chills.  Skin does not complain of rashes or itching.  Head ears eyes nose mouth and throat has prescription lenses is not complaining of sore throat or visual changes.  Respiratory not complaining of cough or shortness of breath did have a cough earlier in her stay but this appears to have resolved.  Cardiac does not complaining of chest pain or palpitations does have a history of atrial  fibrillation.  GI does not complain of nausea vomiting diarrhea or constipation or abdominal discomfort says she has a good appetite.  GU is not complaining of dysuria.  Muscle skeletal at this point pain appears to be controlled she is on Norco as needed-she is ambulating with a walker.  Neurologic is not complaining of dizziness headache or syncopal-type episodes.  Psych does have a history of dementia I would consider this as mild/moderate-appears to be doing well with supportive care does not overtly complain of depression or anxiety continues to be bright alert very pleasant individual.     There is no immunization history on file for this patient. Pertinent  Health Maintenance Due  Topic Date Due  . DEXA SCAN  12/18/2016 (Originally 10/11/1995)  . PNA vac Low Risk Adult (1 of 2 - PCV13) 12/18/2016 (Originally 10/11/1995)  . INFLUENZA VACCINE  03/05/2016   No flowsheet data found. Functional Status Survey:    Filed Vitals:   01/23/16 1027  BP: 118/74  Pulse: 77  Temp: 97.9 F (36.6 C)  TempSrc: Oral  Resp: 20  Height: 5\' 7"  (1.702 m)  Weight: 135 lb 6.4 oz (61.417 kg)   Body mass index is 21.2 kg/(m^2). Physical Exam   In general this is a very pleasant elderly female in no distress.  Her skin is warm and dry she does have venous stasis changes lower extremities.  Eyes prescription lenses are in place visual acuity appears grossly intact sclera and conjunctivae are clear.  Oropharynx is clear mucous membranes moist she does have dentures.  Chest is clear to auscultation there is no labored breathing.  Heart is regular irregular rate and rhythm without murmur gallop or rub she has  trace lower extremity edema pedal pulses intact.  Abdomen is soft nontender positive bowel sounds.  Muscle skeletal is able to stand without assistance and ambulate with walker although still somewhat weak she does have arthritic changes most prominently of her hands strength appears  to be intact however. Neurologic is grossly intact no lateralizing findings her speech is clear.  Psych she is oriented to self gives a fairly accurate history says she lives in [place] but will be with her son in [place] for a while-again her dementia appears to be in the mild-to-moderate category.     Labs reviewed:  Recent Labs  12/05/15 0725 12/09/15 12/09/15 0445 01/02/16 0820  NA 138 137 137 137  K 3.8  --  3.8 3.7  CL 105  --  101 101  CO2 28  --  28 31  GLUCOSE 96  --  93 92  BUN 18 16 16  21*  CREATININE 0.68 0.9 0.86 0.92  CALCIUM 8.9  --  9.0 9.9    Recent Labs  07/12/15 1327 07/13/15 0634 11/30/15 0525  AST 33 39 20  ALT 20 30 15   ALKPHOS 67 58 52  BILITOT 1.6* 1.6* 1.2  PROT 6.8 6.1* 5.8*  ALBUMIN 4.0 3.5 3.7    Recent Labs  12/02/15 0628  12/04/15 0503 12/14/15 12/14/15 0720 01/02/16 0820  WBC 7.3  < > 6.9 6.3 6.3 4.0  NEUTROABS 5.4  --   --   --  4.7 2.6  HGB 10.6*  < > 10.1*  --  11.4* 11.6*  HCT 32.5*  < > 30.9*  --  35.7* 36.8  MCV 85.5  < > 86.1  --  84.4 84.0  PLT 111*  < > 164  --  326 204  < > = values in this interval not displayed. Lab Results  Component Value Date   TSH 0.807 01/02/2016   No results found for: HGBA1C No results found for: CHOL, HDL, LDLCALC, LDLDIRECT, TRIG, CHOLHDL  Significant Diagnostic Results in last 30 days:  Dg Hip Unilat With Pelvis 2-3 Views Left  01/15/2016  2 views of the left hip AP and lateral no pelvis Fracture left hip treated with internal fixation Findings: There is a nail device in the proximal femur with the lag screw and a distal locking screw. The fracture is impacted. There is no significant displacement or angulation. There are no hardware complications. Impression stable fixation with internal fixation short gamma nail left hip   Assessment/Plan  #1 history of left femur fracture with repair-she has been followed by orthopedics as been cleared for weightbearing as tolerated-uses a  walker but will need continued  PT and OT has not patient gained strength.  #2 history of atrial fibrillation this appears rate controlled on atenolol-she is on Coumadin for anticoagulation-also continues on Plavix and note she does have a previous history of mitral valve repair-has been followed by cardiology.  INR has been therapeutic during her stay here this will need updating by home health with primary care provider notified of results.  #3-history of hypertension this appears stable on Dyazide lisinopril and Norvasc as noted above  #4 hypothyroidism last TSH of 0.807 on 01/02/2016 has shown improvement this will need updating by primary care provider as well.  #5 history of thrombocytopenia this apparently occurred in the hospital but has been stabilized here most recent platelet count 204,000 on 01/02/2016 will need follow-up by primary care provider.    [id number] note greater than 30 minutes spent on this discharge summary-greater than 50% of time spent coordinating plan of care for numerous diagnoses        Oralia Manis, [place]

## 2016-01-25 ENCOUNTER — Ambulatory Visit (HOSPITAL_COMMUNITY): Payer: Medicare Other | Attending: Internal Medicine | Admitting: Physical Therapy

## 2016-01-25 DIAGNOSIS — R262 Difficulty in walking, not elsewhere classified: Secondary | ICD-10-CM | POA: Diagnosis not present

## 2016-01-25 DIAGNOSIS — Z9181 History of falling: Secondary | ICD-10-CM

## 2016-01-25 DIAGNOSIS — R2681 Unsteadiness on feet: Secondary | ICD-10-CM | POA: Diagnosis present

## 2016-01-25 DIAGNOSIS — M25552 Pain in left hip: Secondary | ICD-10-CM | POA: Diagnosis present

## 2016-01-25 DIAGNOSIS — M6281 Muscle weakness (generalized): Secondary | ICD-10-CM | POA: Diagnosis present

## 2016-01-25 NOTE — Patient Instructions (Addendum)
Knee Extension (Sitting)    Place ___2_ pound weight on left ankle and straighten knee fully, lower slowly. Repeat __10__ times per set. Do _1___ sets per session. Do _3___ sessions per day.  http://orth.exer.us/732   Copyright  VHI. All rights reserved.  Bridging    Slowly raise buttocks from floor, keeping stomach tight. Repeat __10__ times per set. Do __1__ sets per session. Do _2___ sessions per day.  http://orth.exer.us/1096   http://orth.exer.us/1096   Copyright  VHI. All rights reserved.  Strengthening: Straight Leg Raise (Phase 1)    Tighten muscles on front of right thigh, then lift leg _15___ inches from surface, keeping knee locked.  Repeat __10__ times per set. Do 1____ sets per session. Do __2__ sessions per day.  http://orth.exer.us/614   Copyright  VHI. All rights reserved.  Strengthening: Hip Abduction (Side-Lying)    Tighten muscles on front of left thigh, then lift leg _10___ inches from surface, keeping knee locked.  Repeat __10__ times per set. Do ____ sets per session. Do _2___ sessions per day. 2 http://orth.exer.us/622   Copyright  VHI. All rights reserved.  Functional Quadriceps: Chair Squat    Keeping feet flat on floor, shoulder width apart, squat as low as is comfortable. Use support as necessary. Repeat __10__ times per set. Do __1__ sets per session. Do __2__ sessions per day.  http://orth.exer.us/736   Copyright  VHI. All rights reserved.

## 2016-01-26 NOTE — Therapy (Signed)
[place]ney [name], [place], [address] Phone: [phone]   Fax:  [phone]  Physical Therapy Evaluation  Patient Details  Name: [place] [name] MRN: [id number] Date of Birth: 12/09/1928 Referring Provider: Dr. Unice Cobble  Encounter Date: 01/25/2016      PT End of Session - 01/25/16 0806    Visit Number 1   Number of Visits 12   Date for PT Re-Evaluation 02/25/16   Authorization Type medicare   Authorization - Visit Number 1   Authorization - Number of Visits 10   PT Start Time [id number]   PT Stop Time 1432   PT Time Calculation (min) 43 min   Activity Tolerance Patient tolerated treatment well   Behavior During Therapy [facility] for tasks assessed/performed      Past Medical History  Diagnosis Date  . Stroke [facility])     on Clopidrigel  . Hypertension   . Vascular dementia   . Hypothyroidism   . History of mitral valve replacement   . UTI (lower urinary tract infection) 07/2015    n&v; AKI  . Permanent atrial fibrillation ([name])   . Chronic anticoagulation     coumadin, CHADS2VASC=6  . Biatrial enlargement 12/01/2015  . Pulmonary hypertension, moderate to severe ([place]) 12/01/2015  . Osteoporosis     on Boniva    Past Surgical History  Procedure Laterality Date  . Mitral valve replacement    . Abdominal hysterectomy    . Intramedullary (im) nail intertrochanteric Left 12/01/2015    Procedure: INTRAMEDULLARY (IM) NAIL INTERTROCHANTRIC;  Surgeon: Carole Civil, MD;  Location: [facility];  Service: Orthopedics;  Laterality: Left;  . Fracture surgery  [id number]    Operative repair with intramedullary nail intratrochanteric Left Hip fracture    There were no vitals filed for this visit.       Subjective Assessment - 01/25/16 1404    Subjective [name] fractured her Lt hip on 11/29/2015 after a fall in her bathroom.  She recieved an ORIF 12/01/2015.  She was discharged to the [facility] from 12/04/2015 until 01/23/2016.  She  is now being referred to skilled physical theapy to attempt to return her to her prior functional level of walking without any assistive device.    How long can you sit comfortably? no problem    How long can you walk comfortably? She is now using her walker for 15-20 minutes    Patient Stated Goals To be able to walk without her walker, to be able to go up and down steps, walking for exercise, going back to the gym    Currently in Pain? No/denies  worst is a 6/10    Pain Location Leg   Pain Orientation Left   Pain Descriptors / Indicators Aching                       [facility] Adult PT Treatment/Exercise - 01/26/16 0001    Exercises   Exercises Knee/Hip   Knee/Hip Exercises: Seated   Long Arc Quad Strengthening;Left;10 reps;Weights   Long Arc Quad Weight 4 lbs.   Knee/Hip Exercises: Supine   Bridges 10 reps   Straight Leg Raises Strengthening;Left;10 reps   Knee/Hip Exercises: Sidelying   Hip ABduction Strengthening;Both;10 reps                PT Education - 01/25/16 0806    Education provided Yes   Education Details HEP   Person(s) Educated Patient;Caregiver(s)  Methods Explanation;Tactile cues;Verbal cues;Handout   Comprehension Verbalized understanding;Returned demonstration          PT Short Term Goals - 01/26/16 0820    PT SHORT TERM GOAL #1   Title Pt to be able to single leg stance for 5-10 seconds on both legs to allow pt to feel confident ambulating with a cane indoors    Time 3   Period Weeks   Status New   PT SHORT TERM GOAL #2   Title Pt strength in her left leg to be increased 1/2 grade to allow pt to be able to lift her leg easily in and out of the bed/car.   Time 3   Period Weeks   Status New   PT SHORT TERM GOAL #3   Title Pt to be able to come from sit to stand from her couch with ease.   Time 3   Period Weeks   Status New   PT SHORT TERM GOAL #4   Title Pt pain in her Lt hip to be no greater than a 3/10 to allow pt to be able  to walk for 15 minutes without difficulty with a cane    Time 3   Period Weeks           PT Long Term Goals - 01/25/16 0817    PT LONG TERM GOAL #1   Title Pt to be able to single leg stance for 15- 20 seconds on both legs  to allow pt to feel confident walking with no assistive device on flat terrain    Time 6   Period Weeks   Status New   PT LONG TERM GOAL #2   Title Pt strength in her left LE to be increased by one grade to allow pt to go up and down 10 steps in a reciprocal manner to get into her bedroom    Time 6   Period Weeks   Status New   PT LONG TERM GOAL #3   Title Pt pain to be at a 1/10 or less in her left hip to allow her to walk for 40 minutes at a time without stopping to get back to walking for exercise for improved health benefits.    Time 6   Period Weeks               Plan - 01/25/16 [id number]    Clinical Impression Statement [name] is an 82 yo female who had been very active; walking for exercise and [facility].  She fell and broke her hip in April.  She went to a SNF following her hospitalization, however, her son feels that they did not push his mother enough and she still is walking with a walker.  [name] would like to get back to her previous level which was ambulating without any assistive device therefore she has been referred to skilled physcial therapy.  Examination demonstrates decreased balance, decreased strength, difficulty walking, postrual limitations and pain.   Ms. Razo will benefit from skilled physcial therapy to address these issues and maximize her functioning potential.     Rehab Potential Good   PT Frequency 2x / week   PT Duration 6 weeks   PT Treatment/Interventions ADLs/Self Care Home Management;Gait training;Stair training;Functional mobility training;Therapeutic activities;Therapeutic exercise;Balance training;Neuromuscular re-education;Patient/family education;Manual techniques   PT Next Visit  Plan begin Balance activity including SLS, rockerboard, tandem stance; strengtheing including squats, slow sit to stand, heel  raises; attempt gait training with a cane.  Progress to step ups, lunges, stairs.    PT Home Exercise Plan given    Consulted and Agree with Plan of Care Patient      Patient will benefit from skilled therapeutic intervention in order to improve the following deficits and impairments:  Abnormal gait, Decreased balance, Decreased activity tolerance, Decreased strength, Postural dysfunction, Pain, Difficulty walking  Visit Diagnosis: Difficulty in walking, not elsewhere classified  Pain in left hip  History of falling  Unsteadiness on feet  Muscle weakness (generalized)      G-Codes - 02/06/2016 [id number]    Functional Limitation Mobility: Walking and moving around   Mobility: Walking and Moving Around Current Status [phone]) At least 60 percent but less than 80 percent impaired, limited or restricted   Mobility: Walking and Moving Around Goal Status [phone]) At least 20 percent but less than 40 percent impaired, limited or restricted       Problem List Patient Active Problem List   Diagnosis Date Noted  . Cough 12/16/2015  . Osteoporosis 12/05/2015  . Biatrial enlargement 12/01/2015  . Pulmonary hypertension, moderate to severe ([place]) 12/01/2015  . Closed fracture of intertrochanteric section of femur ([place])   . Chronic atrial fibrillation ([place]) 11/30/2015  . Hypothyroidism 11/29/2015  . Thrombocytopenia ([place]) 07/13/2015  . Chronic anticoagulation 07/13/2015  . H/O mitral valve replacement 07/13/2015  . Vascular dementia 07/12/2015  . Essential hypertension 07/12/2015    Rayetta Humphrey, PT CLT [phone] 01/26/2016, 8:25 AM  [place], [place], [address] Phone: [phone]   Fax:  [phone]  Name: Phronsie Cussen MRN: [id number] Date of Birth: 1929/04/16

## 2016-01-31 ENCOUNTER — Ambulatory Visit (HOSPITAL_COMMUNITY): Payer: Medicare Other

## 2016-01-31 ENCOUNTER — Encounter (HOSPITAL_COMMUNITY)
Admission: RE | Admit: 2016-01-31 | Discharge: 2016-01-31 | Disposition: A | Discharge status: Home / Self Care | Payer: Medicare Other | Source: Skilled Nursing Facility | Attending: Internal Medicine | Admitting: Internal Medicine

## 2016-01-31 DIAGNOSIS — R2681 Unsteadiness on feet: Secondary | ICD-10-CM

## 2016-01-31 DIAGNOSIS — R262 Difficulty in walking, not elsewhere classified: Secondary | ICD-10-CM

## 2016-01-31 DIAGNOSIS — M25552 Pain in left hip: Secondary | ICD-10-CM

## 2016-01-31 DIAGNOSIS — M6281 Muscle weakness (generalized): Secondary | ICD-10-CM

## 2016-01-31 DIAGNOSIS — Z9181 History of falling: Secondary | ICD-10-CM

## 2016-01-31 NOTE — Therapy (Signed)
[facility] [address] [place], [place], [address] Phone: [phone]   Fax:  [phone]  Physical Therapy Treatment  Patient Details  Name: Courtney [name] MRN: [id number] Date of Birth: March 02, 1929 Referring Provider: Dr. Unice Cobble  Encounter Date: 01/31/2016      PT End of Session - 01/31/16 1610    Visit Number 2   Number of Visits 12   Date for PT Re-Evaluation 02/25/16   Authorization Type medicare   Authorization - Visit Number 2   Authorization - Number of Visits 10   PT Start Time 1601   PT Stop Time [id number]   PT Time Calculation (min) 46 min   Equipment Utilized During Treatment Gait belt   Activity Tolerance Patient tolerated treatment well   Behavior During Therapy [facility] for tasks assessed/performed      Past Medical History  Diagnosis Date  . Stroke [facility])     on Clopidrigel  . Hypertension   . Vascular dementia   . Hypothyroidism   . History of mitral valve replacement   . UTI (lower urinary tract infection) 07/2015    n&v; AKI  . Permanent atrial fibrillation ([place])   . Chronic anticoagulation     coumadin, CHADS2VASC=6  . Biatrial enlargement 12/01/2015  . Pulmonary hypertension, moderate to severe ([place]) 12/01/2015  . Osteoporosis     on Boniva    Past Surgical History  Procedure Laterality Date  . Mitral valve replacement    . Abdominal hysterectomy    . Intramedullary (im) nail intertrochanteric Left 12/01/2015    Procedure: INTRAMEDULLARY (IM) NAIL INTERTROCHANTRIC;  Surgeon: Carole Civil, MD;  Location: [facility];  Service: Orthopedics;  Laterality: Left;  . Fracture surgery  [id number]    Operative repair with intramedullary nail intratrochanteric Left Hip fracture    There were no vitals filed for this visit.      Subjective Assessment - 01/31/16 1601    Subjective Pt and her son report compliance with HEP and going to the [facility] 2-3 times a week.  Stated pain scale 6/10 soreness Lt hip.     Patient Stated Goals To be able to walk without her walker, to be able to go up and down steps, walking for exercise, going back to the gym    Currently in Pain? Yes   Pain Score 6    Pain Location Hip   Pain Orientation Left   Pain Descriptors / Indicators Sore   Pain Type Acute pain   Pain Radiating Towards none   Pain Onset More than a month ago   Pain Frequency Constant   Aggravating Factors  Moving Lt LE   Pain Relieving Factors rest and pain medication   Effect of Pain on Daily Activities minimal limitations            [facility] Adult PT Treatment/Exercise - 01/31/16 0001    Knee/Hip Exercises: Standing   Heel Raises 10 reps   Heel Raises Limitations Toe raises   Functional Squat 10 reps   Functional Squat Limitations cueing for form   Rocker Board 2 minutes   Rocker Board Limitations R/L and A/P   SLS Bil 2" max; 30" holds Bil with 1 UE A   Gait Training 453ft with SPC initally 3 point sequence then 2 point   Other Standing Knee Exercises Tandem stance 2x 30"   Knee/Hip Exercises: Seated   Sit to Sand 10 reps;without UE support  cueing for slow control  PT Short Term Goals - 01/26/16 0820    PT SHORT TERM GOAL #1   Title Pt to be able to single leg stance for 5-10 seconds on both legs to allow pt to feel confident ambulating with a cane indoors    Time 3   Period Weeks   Status New   PT SHORT TERM GOAL #2   Title Pt strength in her left leg to be increased 1/2 grade to allow pt to be able to lift her leg easily in and out of the bed/car.   Time 3   Period Weeks   Status New   PT SHORT TERM GOAL #3   Title Pt to be able to come from sit to stand from her couch with ease.   Time 3   Period Weeks   Status New   PT SHORT TERM GOAL #4   Title Pt pain in her Lt hip to be no greater than a 3/10 to allow pt to be able to walk for 15 minutes without difficulty with a cane    Time 3   Period Weeks           PT Long Term Goals - 01/25/16 0817    PT  LONG TERM GOAL #1   Title Pt to be able to single leg stance for 15- 20 seconds on both legs  to allow pt to feel confident walking with no assistive device on flat terrain    Time 6   Period Weeks   Status New   PT LONG TERM GOAL #2   Title Pt strength in her left LE to be increased by one grade to allow pt to go up and down 10 steps in a reciprocal manner to get into her bedroom    Time 6   Period Weeks   Status New   PT LONG TERM GOAL #3   Title Pt pain to be at a 1/10 or less in her left hip to allow her to walk for 40 minutes at a time without stopping to get back to walking for exercise for improved health benefits.    Time 6   Period Weeks               Plan - 01/31/16 1658    Clinical Impression Statement Reviewed goals with pt and her son who sat through apt today, reviewed compliance with HEP with reports of confidence with form and technqiue wtih all exercises and copy of eval given to pt.  Pt educated on proper hand placement and 3 point then 2 point sequence with [facility] with abilty to verbalize and demonstrate appropriate mechanics safely.  Pt and son encouraged to practice gait training with SPC at home safely but continue with RW in community for safety.  Began [facility] for functional strengthening and balance training with therapist facilitatin for proper form, technique and safety.  EOS pt reports pain reduce to 4/10.   Rehab Potential Good   PT Frequency 2x / week   PT Duration 6 weeks   PT Treatment/Interventions ADLs/Self Care Home Management;Gait training;Stair training;Functional mobility training;Therapeutic activities;Therapeutic exercise;Balance training;Neuromuscular re-education;Patient/family education;Manual techniques   PT Next Visit Plan Continue current PT POC  Continue SPC (decreased short term memory so assure safe mechanics prior reducing AD), balance training and functional strengthening.  Progress to lunges and stair training when ready.        Patient  will benefit from skilled therapeutic intervention in order to improve the following deficits  and impairments:  Abnormal gait, Decreased balance, Decreased activity tolerance, Decreased strength, Postural dysfunction, Pain, Difficulty walking  Visit Diagnosis: Difficulty in walking, not elsewhere classified  Pain in left hip  History of falling  Unsteadiness on feet  Muscle weakness (generalized)     Problem List Patient Active Problem List   Diagnosis Date Noted  . Cough 12/16/2015  . Osteoporosis 12/05/2015  . Biatrial enlargement 12/01/2015  . Pulmonary hypertension, moderate to severe ([place]) 12/01/2015  . Closed fracture of intertrochanteric section of femur ([place])   . Chronic atrial fibrillation ([place]) 11/30/2015  . Hypothyroidism 11/29/2015  . Thrombocytopenia ([place]) 07/13/2015  . Chronic anticoagulation 07/13/2015  . H/O mitral valve replacement 07/13/2015  . Vascular dementia 07/12/2015  . Essential hypertension 07/12/2015   Ihor Austin, [place]; CBIS [phone]'   Aldona Lento 01/31/2016, 7:04 PM  [facility] [address] [place], [place], [address] Phone: [phone]   Fax:  [phone]  Name: Kamauria Weisinger MRN: [id number] Date of Birth: [birth year]

## 2016-02-02 ENCOUNTER — Ambulatory Visit (HOSPITAL_COMMUNITY): Payer: Medicare Other | Admitting: Physical Therapy

## 2016-02-02 DIAGNOSIS — Z9181 History of falling: Secondary | ICD-10-CM

## 2016-02-02 DIAGNOSIS — R2681 Unsteadiness on feet: Secondary | ICD-10-CM

## 2016-02-02 DIAGNOSIS — M25552 Pain in left hip: Secondary | ICD-10-CM

## 2016-02-02 DIAGNOSIS — M6281 Muscle weakness (generalized): Secondary | ICD-10-CM

## 2016-02-02 DIAGNOSIS — R262 Difficulty in walking, not elsewhere classified: Secondary | ICD-10-CM

## 2016-02-02 NOTE — Therapy (Signed)
[facility] [address] [place], [place], [address] Phone: [phone]   Fax:  [phone]  Physical Therapy Treatment  Patient Details  Name: Courtney [name] MRN: [id number] Date of Birth: 1929-04-03 Referring Provider: Dr. Unice Cobble  Encounter Date: 02/02/2016      PT End of Session - 02/02/16 1435    Visit Number 3   Number of Visits 12   Date for PT Re-Evaluation 02/25/16   Authorization Type medicare   Authorization - Visit Number 3   Authorization - Number of Visits 10   PT Start Time [id number]   PT Stop Time 1432   PT Time Calculation (min) 40 min   Equipment Utilized During Treatment Gait belt   Activity Tolerance Patient tolerated treatment well   Behavior During Therapy [facility] for tasks assessed/performed      Past Medical History  Diagnosis Date  . Stroke [facility])     on Clopidrigel  . Hypertension   . Vascular dementia   . Hypothyroidism   . History of mitral valve replacement   . UTI (lower urinary tract infection) 07/2015    n&v; AKI  . Permanent atrial fibrillation ([place])   . Chronic anticoagulation     coumadin, CHADS2VASC=6  . Biatrial enlargement 12/01/2015  . Pulmonary hypertension, moderate to severe ([place]) 12/01/2015  . Osteoporosis     on Boniva    Past Surgical History  Procedure Laterality Date  . Mitral valve replacement    . Abdominal hysterectomy    . Intramedullary (im) nail intertrochanteric Left 12/01/2015    Procedure: INTRAMEDULLARY (IM) NAIL INTERTROCHANTRIC;  Surgeon: Carole Civil, MD;  Location: [facility];  Service: Orthopedics;  Laterality: Left;  . Fracture surgery  [id number]    Operative repair with intramedullary nail intratrochanteric Left Hip fracture    There were no vitals filed for this visit.      Subjective Assessment - 02/02/16 1353    Subjective Pt states she is doing well. She has no complaints of soreness, etc. Took yesterday off due to busy workout schedule this week.    Patient Stated Goals To be able to walk without her walker, to be able to go up and down steps, walking for exercise, going back to the gym    Currently in Pain? No/denies   Pain Onset More than a month ago                         [facility] Adult PT Treatment/Exercise - 02/02/16 0001    Ambulation/Gait   Ambulation/Gait Yes   Ambulation Distance (Feet) 500 Feet   Assistive device Straight cane   Gait Comments pt ambulating with good sequencing and placement of SPC without cues from PT. Pt was able to negotiate around obstacles in the gym with CGA and no noted LOB.    Knee/Hip Exercises: Standing   Heel Raises 20 reps   Heel Raises Limitations BUE on // bars    Forward Step Up Left;1 set;20 reps;Hand Hold: 1;Step Height: 4"   Functional Squat 2 sets  10 and 20 reps with tactile cues for weight shift             Balance Exercises - 02/02/16 1420    Balance Exercises: Standing   Standing Eyes Opened Narrow base of support (BOS);2 reps;20 secs   Tandem Stance Eyes open;Intermittent upper extremity support;2 reps;20 secs  ~5 sec at a time, increased difficulty with RLE forward  SLS Eyes open;Intermittent upper extremity support;2 reps;15 secs  LE proped on bolster           PT Education - 02/02/16 1434    Education provided Yes   Education Details discussed use of SPC more at home with supervision; encouraged continued HEP adherence   Person(s) Educated Patient;Caregiver(s)   Methods Explanation;Demonstration   Comprehension Verbalized understanding;Returned demonstration          PT Short Term Goals - 01/26/16 0820    PT SHORT TERM GOAL #1   Title Pt to be able to single leg stance for 5-10 seconds on both legs to allow pt to feel confident ambulating with a cane indoors    Time 3   Period Weeks   Status New   PT SHORT TERM GOAL #2   Title Pt strength in her left leg to be increased 1/2 grade to allow pt to be able to lift her leg easily in and out of  the bed/car.   Time 3   Period Weeks   Status New   PT SHORT TERM GOAL #3   Title Pt to be able to come from sit to stand from her couch with ease.   Time 3   Period Weeks   Status New   PT SHORT TERM GOAL #4   Title Pt pain in her Lt hip to be no greater than a 3/10 to allow pt to be able to walk for 15 minutes without difficulty with a cane    Time 3   Period Weeks           PT Long Term Goals - 01/25/16 0817    PT LONG TERM GOAL #1   Title Pt to be able to single leg stance for 15- 20 seconds on both legs  to allow pt to feel confident walking with no assistive device on flat terrain    Time 6   Period Weeks   Status New   PT LONG TERM GOAL #2   Title Pt strength in her left LE to be increased by one grade to allow pt to go up and down 10 steps in a reciprocal manner to get into her bedroom    Time 6   Period Weeks   Status New   PT LONG TERM GOAL #3   Title Pt pain to be at a 1/10 or less in her left hip to allow her to walk for 40 minutes at a time without stopping to get back to walking for exercise for improved health benefits.    Time 6   Period Weeks               Plan - 02/02/16 1435    Clinical Impression Statement Today's session focused on functional strengthening and balance activity to improve safety at home. Pt demonstrated good sequencing and steadiness with SPC and supervision during her session for ~511ft. I reviewed this with her caregiver and encouraged more use of SPC at home as long as she is being supervised to encourage more consistency with correct use. Pt with no report of pain at the end of today's session. Will continue with current POC.    Rehab Potential Good   PT Frequency 2x / week   PT Duration 6 weeks   PT Treatment/Interventions ADLs/Self Care Home Management;Gait training;Stair training;Functional mobility training;Therapeutic activities;Therapeutic exercise;Balance training;Neuromuscular re-education;Patient/family  education;Manual techniques   PT Next Visit Plan Continue [facility] with negotiating obstacles, balance training and functional strengthening.  PT Home Exercise Plan no updates this visit   Consulted and Agree with Plan of Care Family member/caregiver;Patient   Family Member Consulted son       Patient will benefit from skilled therapeutic intervention in order to improve the following deficits and impairments:  Abnormal gait, Decreased balance, Decreased activity tolerance, Decreased strength, Postural dysfunction, Pain, Difficulty walking  Visit Diagnosis: Difficulty in walking, not elsewhere classified  Pain in left hip  History of falling  Unsteadiness on feet  Muscle weakness (generalized)     Problem List Patient Active Problem List   Diagnosis Date Noted  . Cough 12/16/2015  . Osteoporosis 12/05/2015  . Biatrial enlargement 12/01/2015  . Pulmonary hypertension, moderate to severe ([place]) 12/01/2015  . Closed fracture of intertrochanteric section of femur ([place])   . Chronic atrial fibrillation ([place]) 11/30/2015  . Hypothyroidism 11/29/2015  . Thrombocytopenia ([place]) 07/13/2015  . Chronic anticoagulation 07/13/2015  . H/O mitral valve replacement 07/13/2015  . Vascular dementia 07/12/2015  . Essential hypertension 07/12/2015   2:43 PM,02/02/2016 Elly Modena PT, DPT [facility] [address] [place], [place], [address] Phone: [phone]   Fax:  [phone]  Name: Wilbur Ha MRN: [id number] Date of Birth: 04-17-1929

## 2016-02-14 ENCOUNTER — Ambulatory Visit (HOSPITAL_COMMUNITY): Payer: Medicare Other | Attending: Internal Medicine

## 2016-02-14 DIAGNOSIS — R262 Difficulty in walking, not elsewhere classified: Secondary | ICD-10-CM

## 2016-02-14 DIAGNOSIS — M6281 Muscle weakness (generalized): Secondary | ICD-10-CM

## 2016-02-14 DIAGNOSIS — M25552 Pain in left hip: Secondary | ICD-10-CM | POA: Diagnosis not present

## 2016-02-14 DIAGNOSIS — R2681 Unsteadiness on feet: Secondary | ICD-10-CM

## 2016-02-14 DIAGNOSIS — Z9181 History of falling: Secondary | ICD-10-CM | POA: Diagnosis present

## 2016-02-14 NOTE — Therapy (Signed)
[facility] [place], [place], [address] Phone: [phone]   Fax:  [phone]  Physical Therapy Treatment  Patient Details  Name: Courtney [name] MRN: [id number] Date of Birth: 22-Feb-1929 Referring Provider: Dr. Unice Cobble  Encounter Date: 02/14/2016      PT End of Session - 02/14/16 1700    Visit Number 4   Number of Visits 12   Date for PT Re-Evaluation 02/25/16   Authorization Type medicare   Authorization - Visit Number 4   Authorization - Number of Visits 10   PT Start Time [id number]   PT Stop Time 1732   PT Time Calculation (min) 40 min   Equipment Utilized During Treatment Gait belt   Activity Tolerance Patient tolerated treatment well;Patient limited by fatigue   Behavior During Therapy [facility] for tasks assessed/performed      Past Medical History  Diagnosis Date  . Stroke [facility])     on Clopidrigel  . Hypertension   . Vascular dementia   . Hypothyroidism   . History of mitral valve replacement   . UTI (lower urinary tract infection) 07/2015    n&v; AKI  . Permanent atrial fibrillation ([place])   . Chronic anticoagulation     coumadin, CHADS2VASC=6  . Biatrial enlargement 12/01/2015  . Pulmonary hypertension, moderate to severe ([place]) 12/01/2015  . Osteoporosis     on Boniva    Past Surgical History  Procedure Laterality Date  . Mitral valve replacement    . Abdominal hysterectomy    . Intramedullary (im) nail intertrochanteric Left 12/01/2015    Procedure: INTRAMEDULLARY (IM) NAIL INTERTROCHANTRIC;  Surgeon: Carole Civil, MD;  Location: [facility];  Service: Orthopedics;  Laterality: Left;  . Fracture surgery  [id number]    Operative repair with intramedullary nail intratrochanteric Left Hip fracture    There were no vitals filed for this visit.      Subjective Assessment - 02/14/16 1658    Subjective Pt stated she is feeling good today, reports complaince with HEP and [employer] exercises   Patient Stated Goals  To be able to walk without her walker, to be able to go up and down steps, walking for exercise, going back to the gym    Currently in Pain? No/denies                         [facility] Adult PT Treatment/Exercise - 02/14/16 0001    Ambulation/Gait   Ambulation/Gait Yes   Ambulation Distance (Feet) 226 Feet   Assistive device Straight cane   Gait Comments pt ambulating with good sequencing and placement of SPC without cues from PT. Pt was able to negotiate around obstacles in the gym with CGA and no noted LOB.    Knee/Hip Exercises: Standing   Heel Raises 20 reps   Heel Raises Limitations BUE on // bars    Forward Step Up Left;1 set;20 reps;Hand Hold: 1;Step Height: 4"   Functional Squat 2 sets;10 reps   Rocker Board 2 minutes   Rocker Board Limitations R/L and A/P   Gait Training 226 ft with [facility]             Balance Exercises - 02/14/16 1714    Balance Exercises: Standing   Tandem Stance Eyes open;Intermittent upper extremity support;2 reps;20 secs   Tandem Gait 2 reps;Forward  min A no UE A   Sidestepping 2 reps  no UE A  PT Short Term Goals - 01/26/16 0820    PT SHORT TERM GOAL #1   Title Pt to be able to single leg stance for 5-10 seconds on both legs to allow pt to feel confident ambulating with a cane indoors    Time 3   Period Weeks   Status New   PT SHORT TERM GOAL #2   Title Pt strength in her left leg to be increased 1/2 grade to allow pt to be able to lift her leg easily in and out of the bed/car.   Time 3   Period Weeks   Status New   PT SHORT TERM GOAL #3   Title Pt to be able to come from sit to stand from her couch with ease.   Time 3   Period Weeks   Status New   PT SHORT TERM GOAL #4   Title Pt pain in her Lt hip to be no greater than a 3/10 to allow pt to be able to walk for 15 minutes without difficulty with a cane    Time 3   Period Weeks           PT Long Term Goals - 01/25/16 0817    PT LONG TERM GOAL #1    Title Pt to be able to single leg stance for 15- 20 seconds on both legs  to allow pt to feel confident walking with no assistive device on flat terrain    Time 6   Period Weeks   Status New   PT LONG TERM GOAL #2   Title Pt strength in her left LE to be increased by one grade to allow pt to go up and down 10 steps in a reciprocal manner to get into her bedroom    Time 6   Period Weeks   Status New   PT LONG TERM GOAL #3   Title Pt pain to be at a 1/10 or less in her left hip to allow her to walk for 40 minutes at a time without stopping to get back to walking for exercise for improved health benefits.    Time 6   Period Weeks               Plan - 02/14/16 1728    Clinical Impression Statement Session focus on improving functional strengthening and added balance training activities.  Added pre-gait balance activities with min A requiired for safety with cueing to improve posture through session.  End of session pt limiited by fatigue, no reports of increased pain.   Rehab Potential Good   PT Frequency 2x / week   PT Duration 6 weeks   PT Treatment/Interventions ADLs/Self Care Home Management;Gait training;Stair training;Functional mobility training;Therapeutic activities;Therapeutic exercise;Balance training;Neuromuscular re-education;Patient/family education;Manual techniques   PT Next Visit Plan Continue [facility] with negotiating obstacles, balance training and functional strengthening.        Patient will benefit from skilled therapeutic intervention in order to improve the following deficits and impairments:  Abnormal gait, Decreased balance, Decreased activity tolerance, Decreased strength, Postural dysfunction, Pain, Difficulty walking  Visit Diagnosis: Pain in left hip  History of falling  Unsteadiness on feet  Muscle weakness (generalized)  Difficulty in walking, not elsewhere classified     Problem List Patient Active Problem List   Diagnosis Date Noted  .  Cough 12/16/2015  . Osteoporosis 12/05/2015  . Biatrial enlargement 12/01/2015  . Pulmonary hypertension, moderate to severe ([place]) 12/01/2015  . Closed fracture of intertrochanteric section  of femur ([place])   . Chronic atrial fibrillation ([place]) 11/30/2015  . Hypothyroidism 11/29/2015  . Thrombocytopenia ([place]) 07/13/2015  . Chronic anticoagulation 07/13/2015  . H/O mitral valve replacement 07/13/2015  . Vascular dementia 07/12/2015  . Essential hypertension 07/12/2015   Ihor Austin, [place]; [place]  Aldona Lento 02/14/2016, 6:21 PM  [facility], [place], [address] Phone: [phone]   Fax:  [phone]  Name: Courtney [name] MRN: [id number] Date of Birth: 04/02/1929

## 2016-02-16 ENCOUNTER — Ambulatory Visit (HOSPITAL_COMMUNITY): Payer: Medicare Other

## 2016-02-16 DIAGNOSIS — R2681 Unsteadiness on feet: Secondary | ICD-10-CM

## 2016-02-16 DIAGNOSIS — M25552 Pain in left hip: Secondary | ICD-10-CM | POA: Diagnosis not present

## 2016-02-16 DIAGNOSIS — R262 Difficulty in walking, not elsewhere classified: Secondary | ICD-10-CM

## 2016-02-16 DIAGNOSIS — M6281 Muscle weakness (generalized): Secondary | ICD-10-CM

## 2016-02-16 DIAGNOSIS — Z9181 History of falling: Secondary | ICD-10-CM

## 2016-02-16 NOTE — Therapy (Signed)
[place], [place], [address] Phone: [phone]   Fax:  [phone]  Physical Therapy Treatment  Patient Details  Name: Courtney [name] MRN: [id number] Date of Birth: 09-May-1929 Referring Provider: Dr. Garrison Columbus  Encounter Date: 02/16/2016      PT End of Session - 02/16/16 1736    Visit Number 5   Number of Visits 12   Date for PT Re-Evaluation 02/25/16   Authorization Type medicare   Authorization - Visit Number 5   Authorization - Number of Visits 10   PT Start Time [id number]   PT Stop Time 1736   PT Time Calculation (min) 48 min   Equipment Utilized During Treatment Gait belt   Activity Tolerance Patient tolerated treatment well;Patient limited by fatigue   Behavior During Therapy [facility] for tasks assessed/performed      Past Medical History  Diagnosis Date  . Stroke [facility])     on Clopidrigel  . Hypertension   . Vascular dementia   . Hypothyroidism   . History of mitral valve replacement   . UTI (lower urinary tract infection) 07/2015    n&v; AKI  . Permanent atrial fibrillation ([place])   . Chronic anticoagulation     coumadin, CHADS2VASC=6  . Biatrial enlargement 12/01/2015  . Pulmonary hypertension, moderate to severe ([place]) 12/01/2015  . Osteoporosis     on Boniva    Past Surgical History  Procedure Laterality Date  . Mitral valve replacement    . Abdominal hysterectomy    . Intramedullary (im) nail intertrochanteric Left 12/01/2015    Procedure: INTRAMEDULLARY (IM) NAIL INTERTROCHANTRIC;  Surgeon: Carole Civil, MD;  Location: [facility];  Service: Orthopedics;  Laterality: Left;  . Fracture surgery  [id number]    Operative repair with intramedullary nail intratrochanteric Left Hip fracture    There were no vitals filed for this visit.      Subjective Assessment - 02/16/16 1650    Subjective Pt stated she is feeling good today            [facility] PT Assessment - 02/16/16 0001    Assessment    Medical Diagnosis Lt hip ORIF   Referring Provider Dr. Garrison Columbus   Onset Date/Surgical Date 12/01/15   Prior Therapy SNF until 01/23/2016   Precautions   Precautions None             [facility] Adult PT Treatment/Exercise - 02/16/16 0001    Ambulation/Gait   Ambulation/Gait Yes   Ambulation Distance (Feet) 226 Feet   Assistive device Straight cane   Gait Comments pt ambulating with good sequencing and placement of SPC without cues from PT. Pt was able to negotiate around obstacles in the gym with CGA and no noted LOB.    Knee/Hip Exercises: Standing   Heel Raises 20 reps   Heel Raises Limitations BUE on // bars    Forward Lunges 15 reps   Forward Lunges Limitations 6in step   Hip Abduction Both;15 reps;Knee straight   Abduction Limitations 2" holds   Forward Step Up Left;2 sets;10 reps;Hand Hold: 1;Step Height: 4";Step Height: 6"   Functional Squat 2 sets;10 reps   Functional Squat Limitations therapist facilitation   Rocker Board 2 minutes   Rocker Board Limitations R/L and A/P   SLS Lt 7", Rt 12" max of 3   Gait Training 226 ft with [facility]             Balance Exercises - 02/16/16 1716  Balance Exercises: Standing   Tandem Gait 2 reps;Forward   Sidestepping 2 reps             PT Short Term Goals - 01/26/16 0820    PT SHORT TERM GOAL #1   Title Pt to be able to single leg stance for 5-10 seconds on both legs to allow pt to feel confident ambulating with a cane indoors    Time 3   Period Weeks   Status New   PT SHORT TERM GOAL #2   Title Pt strength in her left leg to be increased 1/2 grade to allow pt to be able to lift her leg easily in and out of the bed/car.   Time 3   Period Weeks   Status New   PT SHORT TERM GOAL #3   Title Pt to be able to come from sit to stand from her couch with ease.   Time 3   Period Weeks   Status New   PT SHORT TERM GOAL #4   Title Pt pain in her Lt hip to be no greater than a 3/10 to allow pt to be able to walk for 15  minutes without difficulty with a cane    Time 3   Period Weeks           PT Long Term Goals - 01/25/16 0817    PT LONG TERM GOAL #1   Title Pt to be able to single leg stance for 15- 20 seconds on both legs  to allow pt to feel confident walking with no assistive device on flat terrain    Time 6   Period Weeks   Status New   PT LONG TERM GOAL #2   Title Pt strength in her left LE to be increased by one grade to allow pt to go up and down 10 steps in a reciprocal manner to get into her bedroom    Time 6   Period Weeks   Status New   PT LONG TERM GOAL #3   Title Pt pain to be at a 1/10 or less in her left hip to allow her to walk for 40 minutes at a time without stopping to get back to walking for exercise for improved health benefits.    Time 6   Period Weeks               Plan - 02/16/16 1739    Clinical Impression Statement Continues session focus on functional strengthening and balnace training activities.  Added standing abduction and increased height with step up training.  Therapist facilitation for proper form, technique and safety wtih balance activities.     Rehab Potential Good   PT Frequency 2x / week   PT Duration 6 weeks   PT Treatment/Interventions ADLs/Self Care Home Management;Gait training;Stair training;Functional mobility training;Therapeutic activities;Therapeutic exercise;Balance training;Neuromuscular re-education;Patient/family education;Manual techniques   PT Next Visit Plan Continue [facility] with negotiating obstacles, balance training and functional strengthening.  Progress SLS activites (hurdles, marching, etc) and functional strengtheing with step downs        Patient will benefit from skilled therapeutic intervention in order to improve the following deficits and impairments:  Abnormal gait, Decreased balance, Decreased activity tolerance, Decreased strength, Postural dysfunction, Pain, Difficulty walking  Visit Diagnosis: Pain in left  hip  History of falling  Unsteadiness on feet  Muscle weakness (generalized)  Difficulty in walking, not elsewhere classified     Problem List Patient Active Problem List  Diagnosis Date Noted  . Cough 12/16/2015  . Osteoporosis 12/05/2015  . Biatrial enlargement 12/01/2015  . Pulmonary hypertension, moderate to severe ([place]) 12/01/2015  . Closed fracture of intertrochanteric section of femur ([place])   . Chronic atrial fibrillation ([place]) 11/30/2015  . Hypothyroidism 11/29/2015  . Thrombocytopenia ([place]) 07/13/2015  . Chronic anticoagulation 07/13/2015  . H/O mitral valve replacement 07/13/2015  . Vascular dementia 07/12/2015  . Essential hypertension 07/12/2015   Ihor Austin, [place]; [place]   Aldona Lento 02/16/2016, 6:48 PM  [place] [address] [place], [place], [address] Phone: [phone]   Fax:  [phone]  Name: Courtney [name] MRN: [id number] Date of Birth: 02-Oct-1928

## 2016-02-20 ENCOUNTER — Ambulatory Visit (HOSPITAL_COMMUNITY): Payer: Medicare Other

## 2016-02-20 DIAGNOSIS — M25552 Pain in left hip: Secondary | ICD-10-CM | POA: Diagnosis not present

## 2016-02-20 DIAGNOSIS — R262 Difficulty in walking, not elsewhere classified: Secondary | ICD-10-CM

## 2016-02-20 DIAGNOSIS — Z9181 History of falling: Secondary | ICD-10-CM

## 2016-02-20 DIAGNOSIS — M6281 Muscle weakness (generalized): Secondary | ICD-10-CM

## 2016-02-20 DIAGNOSIS — R2681 Unsteadiness on feet: Secondary | ICD-10-CM

## 2016-02-20 NOTE — Therapy (Signed)
[place], [place], [address] Phone: [phone]   Fax:  [phone]  Physical Therapy Treatment  Patient Details  Name: Courtney [name] MRN: [id number] Date of Birth: [birth year] Referring Provider: Dr. Garrison Columbus  Encounter Date: 02/20/2016      PT End of Session - 02/20/16 1612    Visit Number 6   Number of Visits 12   Date for PT Re-Evaluation 02/25/16   Authorization Type medicare   Authorization - Visit Number 6   Authorization - Number of Visits 10   PT Start Time [id number]   PT Stop Time [id number]   PT Time Calculation (min) 51 min   Equipment Utilized During Treatment Gait belt   Activity Tolerance Patient tolerated treatment well;Patient limited by fatigue;No increased pain   Behavior During Therapy [facility] for tasks assessed/performed      Past Medical History  Diagnosis Date  . Stroke [facility])     on Clopidrigel  . Hypertension   . Vascular dementia   . Hypothyroidism   . History of mitral valve replacement   . UTI (lower urinary tract infection) 07/2015    n&v; AKI  . Permanent atrial fibrillation ([place])   . Chronic anticoagulation     coumadin, CHADS2VASC=6  . Biatrial enlargement 12/01/2015  . Pulmonary hypertension, moderate to severe ([place]) 12/01/2015  . Osteoporosis     on Boniva    Past Surgical History  Procedure Laterality Date  . Mitral valve replacement    . Abdominal hysterectomy    . Intramedullary (im) nail intertrochanteric Left 12/01/2015    Procedure: INTRAMEDULLARY (IM) NAIL INTERTROCHANTRIC;  Surgeon: Carole Civil, MD;  Location: [facility];  Service: Orthopedics;  Laterality: Left;  . Fracture surgery  [id number]    Operative repair with intramedullary nail intratrochanteric Left Hip fracture    There were no vitals filed for this visit.      Subjective Assessment - 02/20/16 1603    Subjective Pt and son report she is doing good, continues to complete HEP and goes to [facility].  No  reports of pain.  Son reports she has been walking without AD, no reports of stubbling or falls.  Continues to use walker at night to go to restroom.     Patient Stated Goals To be able to walk without her walker, to be able to go up and down steps, walking for exercise, going back to the gym    Currently in Pain? No/denies             [facility] Adult PT Treatment/Exercise - 02/20/16 0001    Knee/Hip Exercises: Standing   Forward Lunges 15 reps   Forward Lunges Limitations 6in step   Forward Step Up Left;2 sets;10 reps;Hand Hold: 1;Step Height: 2"   Functional Squat 2 sets;10 reps   Functional Squat Limitations therapist facilitation   Rocker Board 2 minutes   Rocker Board Limitations R/L and A/P   Other Standing Knee Exercises marching 1 UE A 10x 5" hold alternating   Other Standing Knee Exercises 2RT 6 in hurdles alternating   Knee/Hip Exercises: Seated   Long Arc Quad Strengthening;Left;10 reps;Weights   Long Arc Quad Weight 4 lbs.   Sit to Sand 10 reps;without UE support   Knee/Hip Exercises: Supine   Bridges 2 sets;10 reps   Bridges Limitations theraband around knees   Straight Leg Raises Strengthening;Left;10 reps   Straight Leg Raises Limitations 2#   Knee/Hip Exercises: Sidelying   Hip  ABduction Strengthening;Both;10 reps   Hip ABduction Limitations front of wall with cueing to reduce hip flexion             PT Short Term Goals - 01/26/16 0820    PT SHORT TERM GOAL #1   Title Pt to be able to single leg stance for 5-10 seconds on both legs to allow pt to feel confident ambulating with a cane indoors    Time 3   Period Weeks   Status New   PT SHORT TERM GOAL #2   Title Pt strength in her left leg to be increased 1/2 grade to allow pt to be able to lift her leg easily in and out of the bed/car.   Time 3   Period Weeks   Status New   PT SHORT TERM GOAL #3   Title Pt to be able to come from sit to stand from her couch with ease.   Time 3   Period Weeks   Status  New   PT SHORT TERM GOAL #4   Title Pt pain in her Lt hip to be no greater than a 3/10 to allow pt to be able to walk for 15 minutes without difficulty with a cane    Time 3   Period Weeks           PT Long Term Goals - 01/25/16 0817    PT LONG TERM GOAL #1   Title Pt to be able to single leg stance for 15- 20 seconds on both legs  to allow pt to feel confident walking with no assistive device on flat terrain    Time 6   Period Weeks   Status New   PT LONG TERM GOAL #2   Title Pt strength in her left LE to be increased by one grade to allow pt to go up and down 10 steps in a reciprocal manner to get into her bedroom    Time 6   Period Weeks   Status New   PT LONG TERM GOAL #3   Title Pt pain to be at a 1/10 or less in her left hip to allow her to walk for 40 minutes at a time without stopping to get back to walking for exercise for improved health benefits.    Time 6   Period Weeks               Plan - 02/20/16 1659    Clinical Impression Statement Session focus on improviing functional strengthening and balance training.  Reviewed complaince and HEP complete, pt given updated HEP targeting hip musculature and encouraged to increase frequency for maximal benefits.  Added SLS activities, including marching and hurdles, pt initially reluctant to complete activities due to fear of falling though able to complete wtih Rt LE marching for 5" holds.  Pt continues to demonstrate weak hip musculature with gait and therapist facilitation required for proepr form and to reduce compensation with exercises especially step ups and sidelying abduction.     Rehab Potential Good   PT Frequency 2x / week   PT Duration 6 weeks   PT Treatment/Interventions ADLs/Self Care Home Management;Gait training;Stair training;Functional mobility training;Therapeutic activities;Therapeutic exercise;Balance training;Neuromuscular re-education;Patient/family education;Manual techniques   PT Next Visit Plan  Next session focus on gluteal strenghtening with OKC and CKC.  Continue SPC with negotiating obstacles, balance training and functional strengthening.  Progress SLS activites (hurdles, marching, etc) and functional strengtheing with step downs     PT Home  Exercise Plan updated this session      Patient will benefit from skilled therapeutic intervention in order to improve the following deficits and impairments:  Abnormal gait, Decreased balance, Decreased activity tolerance, Decreased strength, Postural dysfunction, Pain, Difficulty walking  Visit Diagnosis: Pain in left hip  History of falling  Unsteadiness on feet  Muscle weakness (generalized)  Difficulty in walking, not elsewhere classified     Problem List Patient Active Problem List   Diagnosis Date Noted  . Cough 12/16/2015  . Osteoporosis 12/05/2015  . Biatrial enlargement 12/01/2015  . Pulmonary hypertension, moderate to severe ([place]) 12/01/2015  . Closed fracture of intertrochanteric section of femur ([place])   . Chronic atrial fibrillation ([place]) 11/30/2015  . Hypothyroidism 11/29/2015  . Thrombocytopenia ([place]) 07/13/2015  . Chronic anticoagulation 07/13/2015  . H/O mitral valve replacement 07/13/2015  . Vascular dementia 07/12/2015  . Essential hypertension 07/12/2015   Ihor Austin, [place]; [place]   Aldona Lento 02/20/2016, 5:21 PM  [place], [place], [address] Phone: [phone]   Fax:  [phone]  Name: Courtney [name] MRN: [id number] Date of Birth: [birth year]

## 2016-02-20 NOTE — Patient Instructions (Signed)
HIP: Flexion / KNEE: Extension, Straight Leg Raise    Raise leg, keeping knee straight. Perform slowly.10 reps per set, 2 sets per day, 3-5 days per week   Copyright  VHI. All rights reserved.   Abductor Strength: Bridge Pose (Strap)    Make strap wide enough to brace knees at hip width. Press into strap with knees. Hold for 5 breaths. Repeat 10 times, rest then complete another set  Copyright  VHI. All rights reserved.   Abduction: Side Leg Lift (Eccentric) - Side-Lying    Lie on side. Lift top leg slightly higher than shoulder level. Keep top leg straight with body, toes pointing forward. Slowly lower for 3-5 seconds. 10 reps per set, 2 sets per day, 3-5  days per week.  http://ecce.exer.us/63   Copyright  VHI. All rights reserved.

## 2016-02-22 ENCOUNTER — Ambulatory Visit (HOSPITAL_COMMUNITY): Payer: Medicare Other

## 2016-02-22 DIAGNOSIS — M25552 Pain in left hip: Secondary | ICD-10-CM | POA: Diagnosis not present

## 2016-02-22 DIAGNOSIS — M6281 Muscle weakness (generalized): Secondary | ICD-10-CM

## 2016-02-22 DIAGNOSIS — R2681 Unsteadiness on feet: Secondary | ICD-10-CM

## 2016-02-22 DIAGNOSIS — R262 Difficulty in walking, not elsewhere classified: Secondary | ICD-10-CM

## 2016-02-22 DIAGNOSIS — Z9181 History of falling: Secondary | ICD-10-CM

## 2016-02-22 NOTE — Therapy (Signed)
[place] [address] [place], [place], [address] Phone: [phone]   Fax:  [phone]  Physical Therapy Treatment  Patient Details  Name: Courtney [name] MRN: [id number] Date of Birth: 31-Dec-1928 Referring Provider: Dr. Garrison Columbus  Encounter Date: 02/22/2016      PT End of Session - 02/22/16 1519    Visit Number 7   Number of Visits 12   Date for PT Re-Evaluation 02/25/16   Authorization Type medicare   Authorization - Visit Number 7   Authorization - Number of Visits 10   PT Start Time [id number]   PT Stop Time 1515   PT Time Calculation (min) 34 min   Equipment Utilized During Treatment Gait belt   Activity Tolerance Patient tolerated treatment well;Patient limited by fatigue;No increased pain   Behavior During Therapy [facility] for tasks assessed/performed      Past Medical History  Diagnosis Date  . Stroke [facility])     on Clopidrigel  . Hypertension   . Vascular dementia   . Hypothyroidism   . History of mitral valve replacement   . UTI (lower urinary tract infection) 07/2015    n&v; AKI  . Permanent atrial fibrillation ([place])   . Chronic anticoagulation     coumadin, CHADS2VASC=6  . Biatrial enlargement 12/01/2015  . Pulmonary hypertension, moderate to severe ([place]) 12/01/2015  . Osteoporosis     on Boniva    Past Surgical History  Procedure Laterality Date  . Mitral valve replacement    . Abdominal hysterectomy    . Intramedullary (im) nail intertrochanteric Left 12/01/2015    Procedure: INTRAMEDULLARY (IM) NAIL INTERTROCHANTRIC;  Surgeon: Carole Civil, MD;  Location: [facility];  Service: Orthopedics;  Laterality: Left;  . Fracture surgery  [id number]    Operative repair with intramedullary nail intratrochanteric Left Hip fracture    There were no vitals filed for this visit.      Subjective Assessment - 02/22/16 1518    Subjective Pt reports she has been doing well. No exciting updates to report. She says she has been  going to the [facility] twice weekly, but other than that feels generally good.    Pertinent History L THA April 2017; Post hip precautions; some memory deficts from remote CVA.    Currently in Pain? No/denies                         [facility] Adult PT Treatment/Exercise - 02/22/16 0001    Knee/Hip Exercises: Standing   Forward Step Up Left;2 sets;10 reps;Hand Hold: 1;Step Height: 4"  Left knee still very weak   Functional Squat 2 sets;10 reps   Functional Squat Limitations VC to avoid valugs   Other Standing Knee Exercises SEated Clam RedTB   2x10    Knee/Hip Exercises: Supine   Quad Sets Left;1 set;10 reps  3sH, heel elevated on folded towel   Short Arc Quad Sets Left;2 sets;10 reps  2x10, foam roller   Bridges 2 sets;Left;Strengthening;10 reps  RedTB at [employer] Limitations HS Bridges for TKE   6" on foam; 2x10              Balance Exercises - 02/22/16 1509    Balance Exercises: Standing   Standing Eyes Opened Foam/compliant surface;Narrow base of support (BOS)  Rotation c physioball; very unstabel 15x bilat             PT Short Term Goals - 01/26/16 [id number]  PT SHORT TERM GOAL #1   Title Pt to be able to single leg stance for 5-10 seconds on both legs to allow pt to feel confident ambulating with a cane indoors    Time 3   Period Weeks   Status New   PT SHORT TERM GOAL #2   Title Pt strength in her left leg to be increased 1/2 grade to allow pt to be able to lift her leg easily in and out of the bed/car.   Time 3   Period Weeks   Status New   PT SHORT TERM GOAL #3   Title Pt to be able to come from sit to stand from her couch with ease.   Time 3   Period Weeks   Status New   PT SHORT TERM GOAL #4   Title Pt pain in her Lt hip to be no greater than a 3/10 to allow pt to be able to walk for 15 minutes without difficulty with a cane    Time 3   Period Weeks           PT Long Term Goals - 01/25/16 0817    PT LONG TERM GOAL #1   Title  Pt to be able to single leg stance for 15- 20 seconds on both legs  to allow pt to feel confident walking with no assistive device on flat terrain    Time 6   Period Weeks   Status New   PT LONG TERM GOAL #2   Title Pt strength in her left LE to be increased by one grade to allow pt to go up and down 10 steps in a reciprocal manner to get into her bedroom    Time 6   Period Weeks   Status New   PT LONG TERM GOAL #3   Title Pt pain to be at a 1/10 or less in her left hip to allow her to walk for 40 minutes at a time without stopping to get back to walking for exercise for improved health benefits.    Time 6   Period Weeks               Plan - 02/22/16 1520    Clinical Impression Statement Pt tolerating session well today, able to progress exercises with minimal additional fatigue. She continues to maintain a strong postrior pelvic tilt and flexed knees for comfort. Therex targeting TKE strength especialyl on Left, as well as glute max strength on left. I suspect soem tightness in the lumbar spine and/or psoas tightness contributing as well, as supine range is near normal limits, but standing tKE is more difficult to achieve. Hip precautions will make stretching these areas difficult.    Rehab Potential Good   PT Frequency 2x / week   PT Duration 6 weeks   PT Treatment/Interventions ADLs/Self Care Home Management;Gait training;Stair training;Functional mobility training;Therapeutic activities;Therapeutic exercise;Balance training;Neuromuscular re-education;Patient/family education;Manual techniques   PT Next Visit Plan Next session focus on gluteal strenghtening with OKC and CKC.  Continue SPC with negotiating obstacles, balance training and functional strengthening.  Progress SLS activites (hurdles, marching, etc) and functional strengtheing with step downs     Consulted and Agree with Plan of Care Family member/caregiver;Patient   Family Member Consulted son       Patient will  benefit from skilled therapeutic intervention in order to improve the following deficits and impairments:  Abnormal gait, Decreased balance, Decreased activity tolerance, Decreased strength, Postural dysfunction, Pain, Difficulty walking  Visit Diagnosis: Pain in left hip  History of falling  Unsteadiness on feet  Muscle weakness (generalized)  Difficulty in walking, not elsewhere classified     Problem List Patient Active Problem List   Diagnosis Date Noted  . Cough 12/16/2015  . Osteoporosis 12/05/2015  . Biatrial enlargement 12/01/2015  . Pulmonary hypertension, moderate to severe ([place]) 12/01/2015  . Closed fracture of intertrochanteric section of femur ([place])   . Chronic atrial fibrillation ([place]) 11/30/2015  . Hypothyroidism 11/29/2015  . Thrombocytopenia ([place]) 07/13/2015  . Chronic anticoagulation 07/13/2015  . H/O mitral valve replacement 07/13/2015  . Vascular dementia 07/12/2015  . Essential hypertension 07/12/2015    3:23 PM, 02/22/2016 Etta Grandchild, PT, DPT Physical Therapist at [place] [phone] (office)     [facility] [address] [place], [place], [address] Phone: [phone]   Fax:  [phone]  Name: Spencer Schifano MRN: [id number] Date of Birth: 1929/03/10

## 2016-02-26 ENCOUNTER — Ambulatory Visit (HOSPITAL_COMMUNITY)
Admission: RE | Admit: 2016-02-26 | Discharge: 2016-02-26 | Disposition: A | Discharge status: Home / Self Care | Payer: Medicare Other | Source: Ambulatory Visit | Attending: Orthopedic Surgery | Admitting: Orthopedic Surgery

## 2016-02-26 ENCOUNTER — Encounter: Payer: Self-pay | Admitting: Orthopedic Surgery

## 2016-02-26 ENCOUNTER — Ambulatory Visit: Payer: Medicare Other | Admitting: Orthopedic Surgery

## 2016-02-26 VITALS — BP 125/74 | Ht 62.0 in | Wt 136.0 lb

## 2016-02-26 DIAGNOSIS — S72142D Displaced intertrochanteric fracture of left femur, subsequent encounter for closed fracture with routine healing: Secondary | ICD-10-CM

## 2016-02-26 DIAGNOSIS — X58XXXD Exposure to other specified factors, subsequent encounter: Secondary | ICD-10-CM | POA: Insufficient documentation

## 2016-02-26 DIAGNOSIS — Z4789 Encounter for other orthopedic aftercare: Secondary | ICD-10-CM

## 2016-02-26 DIAGNOSIS — Z9889 Other specified postprocedural states: Secondary | ICD-10-CM | POA: Insufficient documentation

## 2016-02-26 NOTE — Progress Notes (Signed)
Patient ID: Courtney [name], female   DOB: [birth year], 82 y.o.   MRN: [id number]  Follow up visit  Chief Complaint  Patient presents with  . Follow-up    left hip pain   12/01/2015 DOS  BP 125/74   Ht 5\' 2"  (1.575 m)   Wt 136 lb (61.7 kg)   BMI 24.87 kg/m   Encounter Diagnosis  Name Primary?  . Intertrochanteric fracture of left hip, closed, with routine healing, subsequent encounter Yes   X-rays today show fracture healing. Patient has excellent leg left. She is using a cane  We see her in 3 months no x-ray needed  2:24 PM Arther Abbott, MD 02/26/2016

## 2016-02-28 ENCOUNTER — Ambulatory Visit (HOSPITAL_COMMUNITY): Payer: Medicare Other

## 2016-02-28 DIAGNOSIS — M25552 Pain in left hip: Secondary | ICD-10-CM

## 2016-02-28 DIAGNOSIS — Z9181 History of falling: Secondary | ICD-10-CM

## 2016-02-28 DIAGNOSIS — R2681 Unsteadiness on feet: Secondary | ICD-10-CM

## 2016-02-28 DIAGNOSIS — M6281 Muscle weakness (generalized): Secondary | ICD-10-CM

## 2016-02-28 DIAGNOSIS — R262 Difficulty in walking, not elsewhere classified: Secondary | ICD-10-CM

## 2016-02-28 NOTE — Patient Instructions (Addendum)
Bridge    Lie back, legs bent. Inhale, pressing hips up. Keeping ribs in, lengthen lower back. Exhale, rolling down along spine from top. Repeat 20 times. Do 2 sessions per day.  http://pm.exer.us/55   Copyright  VHI. All rights reserved.   FUNCTIONAL MOBILITY: Squat    Stance: shoulder-width on floor. Bend hips and knees. Keep back straight. Do not allow knees to bend past toes. Squeeze glutes and quads to stand. 15 reps per set, 2 sets per day, 3-5 days per week  Copyright  VHI. All rights reserved.   Hip Abduction / Adduction: with Extended Knee (Supine)    Bring left leg out to side and return. Keep knee straight. Repeat 15 times per set. Do 2 sets per session.   http://orth.exer.us/681   Copyright  VHI. All rights reserved.

## 2016-02-28 NOTE — Therapy (Addendum)
[place], [place], [address] Phone: [phone]   Fax:  [phone]  Physical Therapy Treatment  Patient Details  Name: Courtney [name] MRN: [id number] Date of Birth: 1929/01/26 Referring Provider: Dr. Unice Cobble  Encounter Date: 02/28/2016      PT End of Session - 02/28/16 1324    Visit Number 8   Number of Visits 12   Authorization Type medicare   Authorization - Visit Number 8   Authorization - Number of Visits 10   PT Start Time 1751   PT Stop Time 1350   PT Time Calculation (min) 44 min   Equipment Utilized During Treatment Gait belt   Activity Tolerance Patient tolerated treatment well;No increased pain   Behavior During Therapy WFL for tasks assessed/performed      Past Medical History:  Diagnosis Date  . Biatrial enlargement 12/01/2015  . Chronic anticoagulation    coumadin, CHADS2VASC=6  . History of mitral valve replacement   . Hypertension   . Hypothyroidism   . Osteoporosis    on Boniva  . Permanent atrial fibrillation ([place])   . Pulmonary hypertension, moderate to severe ([place]) 12/01/2015  . Stroke [facility])    on Clopidrigel  . UTI (lower urinary tract infection) 07/2015   n&v; AKI  . Vascular dementia     Past Surgical History:  Procedure Laterality Date  . ABDOMINAL HYSTERECTOMY    . FRACTURE SURGERY  [id number]   Operative repair with intramedullary nail intratrochanteric Left Hip fracture  . INTRAMEDULLARY (IM) NAIL INTERTROCHANTERIC Left 12/01/2015   Procedure: INTRAMEDULLARY (IM) NAIL INTERTROCHANTRIC;  Surgeon: Carole Civil, MD;  Location: [facility];  Service: Orthopedics;  Laterality: Left;  . MITRAL VALVE REPLACEMENT      There were no vitals filed for this visit.      Subjective Assessment - 02/28/16 1319    Subjective Son reports pt. making great progress with increased abilites and continues to exercise at the [facility].  No reports of pain today.     Pertinent History L THA  April 2017; Post hip precautions; some memory deficts from remote CVA.    Patient Stated Goals To be able to walk without her walker, to be able to go up and down steps, walking for exercise, going back to the gym    Currently in Pain? No/denies            [facility] PT Assessment - 02/28/16 0001      Assessment   Medical Diagnosis Lt hip ORIF   Referring Provider Dr. Unice Cobble   Onset Date/Surgical Date 12/01/15   Prior Therapy SNF until 01/23/2016     Precautions   Precautions None     Functional Tests   Functional tests Single leg stance;Sit to Stand     Single Leg Stance   Comments max SLS 2" Bil LE multiple attempts     Sit to Stand   Comments 12.65  5 STS no HHA     Strength   Strength Assessment Site Hip;Knee   Right/Left Hip Right;Left   Right Hip Flexion 4/5  was 4/5   Right Hip Extension 2/5  was 2/5   Right Hip ABduction 3+/5  was 4/5   Left Hip Flexion 4/5  was 4/5   Left Hip Extension 2/5  was 2-/5   Left Hip ABduction 3-/5  was 3-/5   Right/Left Knee Left;Right   Right Knee Flexion 4/5   Right Knee Extension 5/5  Left Knee Flexion 4-/5  was 3+/5   Left Knee Extension 5/5  was 3+/5     6 Minute Walk- Baseline   6 Minute Walk- Baseline --  3MWT able to ambulate 667f with SPC  CGA                     [facility] Adult PT Treatment/Exercise - 02/28/16 0001      Knee/Hip Exercises: Standing   Functional Squat 2 sets;10 reps   Functional Squat Limitations VC to avoid valgus; tactile cueing to improve form   SLS Lt 2", Rt 2" max of 5   Gait Training 3MWT with SPC, 6227f  Other Standing Knee Exercises 3RT 6 in hurdles alternating                  PT Short Term Goals - 02/28/16 1335      PT SHORT TERM GOAL #1   Title Pt to be able to single leg stance for 5-10 seconds on both legs to allow pt to feel confident ambulating with a cane indoors    Baseline 02/28/2016:  Rt 3", Lt 2" max of 3   Status On-going     PT SHORT  TERM GOAL #2   Title Pt strength in her left leg to be increased 1/2 grade to allow pt to be able to lift her leg easily in and out of the bed/car.   Status On-going     PT SHORT TERM GOAL #3   Title Pt to be able to come from sit to stand from her couch with ease.   Baseline 02/28/2016: 5 STS 12.65"   Status Achieved     PT SHORT TERM GOAL #4   Title Pt pain in her Lt hip to be no greater than a 3/10 to allow pt to be able to walk for 15 minutes without difficulty with a cane    Baseline 02/28/2016: No reports of pain in a month   Status Achieved           PT Long Term Goals - 02/28/16 1336      PT LONG TERM GOAL #1   Title Pt to be able to single leg stance for 15- 20 seconds on both legs  to allow pt to feel confident walking with no assistive device on flat terrain    Status Not Met     PT LONG TERM GOAL #2   Title Pt strength in her left LE to be increased by one grade to allow pt to go up and down 10 steps in a reciprocal manner to get into her bedroom    Status On-going     PT LONG TERM GOAL #3   Title Pt pain to be at a 1/10 or less in her left hip to allow her to walk for 40 minutes at a time without stopping to get back to walking for exercise for improved health benefits.    Status On-going               Plan - 02/28/16 1555    Clinical Impression Statement Reassesment complete by DPT and therex complete with PTA this session with the following findings:  Pt currently ambulating wtih SPC and no reports of recent falls.  Pt reports compliance iwth HEP and has been attending [employer] a couple times a week.  Pt continues to present with weak musculature especially gluteal region and impaired balance with ability to SLS max 2"  BLE.  Pt presents improved functional abilities with LRAD gait and improved time with 5 STS in 12.65".  Pt will continue to benefit from skilled intervention to improve strengthening and balance.   Rehab Potential Good   PT Frequency 2x / week    PT Duration 6 weeks   PT Treatment/Interventions ADLs/Self Care Home Management;Gait training;Stair training;Functional mobility training;Therapeutic activities;Therapeutic exercise;Balance training;Neuromuscular re-education;Patient/family education;Manual techniques   PT Next Visit Plan Next session focus on gluteal strenghtening with OKC and CKC.  Continue SPC with negotiating obstacles, balance training and functional strengthening.  Progress SLS activites (hurdles, marching, etc) and functional strengtheing with step downs        Patient will benefit from skilled therapeutic intervention in order to improve the following deficits and impairments:  Abnormal gait, Decreased balance, Decreased activity tolerance, Decreased strength, Postural dysfunction, Pain, Difficulty walking  Visit Diagnosis: Pain in left hip  History of falling  Unsteadiness on feet  Muscle weakness (generalized)  Difficulty in walking, not elsewhere classified     Problem List Patient Active Problem List   Diagnosis Date Noted  . Cough 12/16/2015  . Osteoporosis 12/05/2015  . Biatrial enlargement 12/01/2015  . Pulmonary hypertension, moderate to severe ([place]) 12/01/2015  . Closed fracture of intertrochanteric section of femur ([place])   . Chronic atrial fibrillation ([place]) 11/30/2015  . Hypothyroidism 11/29/2015  . Thrombocytopenia ([place]) 07/13/2015  . Chronic anticoagulation 07/13/2015  . H/O mitral valve replacement 07/13/2015  . Vascular dementia 07/12/2015  . Essential hypertension 07/12/2015   Ihor Austin, [place]; [place]  Aldona Lento 02/28/2016, 4:08 PM   Physical Therapy assessment completed by: Tacy Learn, PT, DPT  02/28/2016    X: [place] [address] [place], [place], [address] Phone: [phone]   Fax:  [phone]  Name: Courtney [name] MRN: [id number] Date of Birth: [birth year]

## 2016-03-01 ENCOUNTER — Ambulatory Visit (HOSPITAL_COMMUNITY): Payer: Medicare Other

## 2016-03-01 DIAGNOSIS — M25552 Pain in left hip: Secondary | ICD-10-CM

## 2016-03-01 DIAGNOSIS — M6281 Muscle weakness (generalized): Secondary | ICD-10-CM

## 2016-03-01 DIAGNOSIS — R2681 Unsteadiness on feet: Secondary | ICD-10-CM

## 2016-03-01 DIAGNOSIS — R262 Difficulty in walking, not elsewhere classified: Secondary | ICD-10-CM

## 2016-03-01 DIAGNOSIS — Z9181 History of falling: Secondary | ICD-10-CM

## 2016-03-01 NOTE — Therapy (Signed)
[place], [place], [address] Phone: [phone]   Fax:  [phone]  Physical Therapy Treatment  Patient Details  Name: Courtney [name] MRN: [id number] Date of Birth: 19-Aug-1928 Referring Provider: Dr. Unice Cobble  Encounter Date: 03/01/2016      PT End of Session - 03/01/16 1313    Visit Number 9   Number of Visits 12   Date for PT Re-Evaluation 03/07/16   Authorization Type medicare   Authorization - Visit Number 9   Authorization - Number of Visits 10   PT Start Time 0388   PT Stop Time 8280   PT Time Calculation (min) 49 min   Equipment Utilized During Treatment Gait belt   Activity Tolerance Patient tolerated treatment well;No increased pain   Behavior During Therapy WFL for tasks assessed/performed      Past Medical History:  Diagnosis Date  . Biatrial enlargement 12/01/2015  . Chronic anticoagulation    coumadin, CHADS2VASC=6  . History of mitral valve replacement   . Hypertension   . Hypothyroidism   . Osteoporosis    on Boniva  . Permanent atrial fibrillation ([place])   . Pulmonary hypertension, moderate to severe ([place]) 12/01/2015  . Stroke [facility])    on Clopidrigel  . UTI (lower urinary tract infection) 07/2015   n&v; AKI  . Vascular dementia     Past Surgical History:  Procedure Laterality Date  . ABDOMINAL HYSTERECTOMY    . FRACTURE SURGERY  [id number]   Operative repair with intramedullary nail intratrochanteric Left Hip fracture  . INTRAMEDULLARY (IM) NAIL INTERTROCHANTERIC Left 12/01/2015   Procedure: INTRAMEDULLARY (IM) NAIL INTERTROCHANTRIC;  Surgeon: Carole Civil, MD;  Location: [facility];  Service: Orthopedics;  Laterality: Left;  . MITRAL VALVE REPLACEMENT      There were no vitals filed for this visit.      Subjective Assessment - 03/01/16 1312    Subjective Pt stated she is doing great, son reports she has been doing lots of activitities at the [facility].   Pertinent History L  THA April 2017; Post hip precautions; some memory deficts from remote CVA.    Patient Stated Goals To be able to walk without her walker, to be able to go up and down steps, walking for exercise, going back to the gym    Currently in Pain? No/denies                         [facility] Adult PT Treatment/Exercise - 03/01/16 0001      Knee/Hip Exercises: Standing   Functional Squat 2 sets;10 reps   Functional Squat Limitations VC to avoid valgus; tactile cueing to improve form   Wall Squat 10 reps;3 seconds   Wall Squat Limitations cueing for form     Knee/Hip Exercises: Supine   Heel Slides Both;15 reps   Heel Slides Limitations heel slides with ABD with RTB   Bridges 20 reps   Bridges Limitations theraband around knee with LE elevated for HS bridge on 6in for TKE     Knee/Hip Exercises: Sidelying   Clams Lt LE only 20x with RTB; unable to tolerate laying on Lt hip feels like there is something in pocket though pocket is empty     Knee/Hip Exercises: Prone   Hamstring Curl 20 reps;5 seconds   Hamstring Curl Limitations heel squeeze 2 sets x 10reps 5" holds   Hip Extension Both;2 sets;10 reps   Hip Extension Limitations  knee flexed with bottom of foot towards ceiling                  PT Short Term Goals - 02/28/16 1335      PT SHORT TERM GOAL #1   Title Pt to be able to single leg stance for 5-10 seconds on both legs to allow pt to feel confident ambulating with a cane indoors    Baseline 02/28/2016:  Rt 3", Lt 2" max of 3   Status On-going     PT SHORT TERM GOAL #2   Title Pt strength in her left leg to be increased 1/2 grade to allow pt to be able to lift her leg easily in and out of the bed/car.   Status On-going     PT SHORT TERM GOAL #3   Title Pt to be able to come from sit to stand from her couch with ease.   Baseline 02/28/2016: 5 STS 12.65"   Status Achieved     PT SHORT TERM GOAL #4   Title Pt pain in her Lt hip to be no greater than a 3/10  to allow pt to be able to walk for 15 minutes without difficulty with a cane    Baseline 02/28/2016: No reports of pain in a month   Status Achieved           PT Long Term Goals - 02/28/16 1336      PT LONG TERM GOAL #1   Title Pt to be able to single leg stance for 15- 20 seconds on both legs  to allow pt to feel confident walking with no assistive device on flat terrain    Status Not Met     PT LONG TERM GOAL #2   Title Pt strength in her left LE to be increased by one grade to allow pt to go up and down 10 steps in a reciprocal manner to get into her bedroom    Status On-going     PT LONG TERM GOAL #3   Title Pt pain to be at a 1/10 or less in her left hip to allow her to walk for 40 minutes at a time without stopping to get back to walking for exercise for improved health benefits.    Status On-going               Plan - 03/01/16 1339    Clinical Impression Statement Session focus on improving gluteal strengthening with OKC and CKC exercises.  Therapist facilitation for proper form and technique wiht AAROM required in prone position exercises due to weakness.  Pt limited by fatigue with new activities, no reports of pain throuih session.   Rehab Potential Good   PT Frequency 2x / week   PT Duration 6 weeks   PT Treatment/Interventions ADLs/Self Care Home Management;Gait training;Stair training;Functional mobility training;Therapeutic activities;Therapeutic exercise;Balance training;Neuromuscular re-education;Patient/family education;Manual techniques   PT Next Visit Plan Next session focus on gluteal strenghtening with OKC and CKC.  Continue SPC with negotiating obstacles, balance training and functional strengthening.  Progress SLS activites (hurdles, marching, etc) and functional strengtheing with step downs        Patient will benefit from skilled therapeutic intervention in order to improve the following deficits and impairments:  Abnormal gait, Decreased balance,  Decreased activity tolerance, Decreased strength, Postural dysfunction, Pain, Difficulty walking  Visit Diagnosis: Pain in left hip  History of falling  Unsteadiness on feet  Muscle weakness (generalized)  Difficulty in walking,  not elsewhere classified     Problem List Patient Active Problem List   Diagnosis Date Noted  . Cough 12/16/2015  . Osteoporosis 12/05/2015  . Biatrial enlargement 12/01/2015  . Pulmonary hypertension, moderate to severe ([place]) 12/01/2015  . Closed fracture of intertrochanteric section of femur ([place])   . Chronic atrial fibrillation ([place]) 11/30/2015  . Hypothyroidism 11/29/2015  . Thrombocytopenia ([place]) 07/13/2015  . Chronic anticoagulation 07/13/2015  . H/O mitral valve replacement 07/13/2015  . Vascular dementia 07/12/2015  . Essential hypertension 07/12/2015   Ihor Austin, [place]; [place]  Aldona Lento 03/01/2016, 2:00 PM  [place], [place], [address] Phone: [phone]   Fax:  [phone]  Name: Courtney [name] MRN: [id number] Date of Birth: [birth year]

## 2016-03-05 ENCOUNTER — Ambulatory Visit (HOSPITAL_COMMUNITY): Payer: Medicare Other | Attending: Internal Medicine

## 2016-03-05 DIAGNOSIS — M25552 Pain in left hip: Secondary | ICD-10-CM | POA: Diagnosis present

## 2016-03-05 DIAGNOSIS — Z9181 History of falling: Secondary | ICD-10-CM | POA: Diagnosis present

## 2016-03-05 DIAGNOSIS — M6281 Muscle weakness (generalized): Secondary | ICD-10-CM

## 2016-03-05 DIAGNOSIS — R262 Difficulty in walking, not elsewhere classified: Secondary | ICD-10-CM

## 2016-03-05 DIAGNOSIS — R2681 Unsteadiness on feet: Secondary | ICD-10-CM

## 2016-03-05 NOTE — Patient Instructions (Signed)
   CLAM SHELLS  While lying on your side with your knees bent, draw up the top knee while keeping contact of your feet together.  Do not let your pelvis roll back during the lifting movement.   Remember to keep your hips stacked one on top of the other.  Don't let the top one roll back.    2 x 10 reps Both sides

## 2016-03-06 NOTE — Therapy (Addendum)
[place], [place], [address] Phone: [phone]   Fax:  [phone]  Physical Therapy Treatment  Patient Details  Name: Courtney [name] MRN: [id number] Date of Birth: January 04, 1929 Referring Provider: Dr. Garrison [place]  Encounter Date: 03/05/2016      PT End of Session - 03/05/16 1600    Visit Number 10   Number of Visits 12   Date for PT Re-Evaluation 03/07/16   Authorization Type medicare   Authorization - Visit Number 10   Authorization - Number of Visits 10   PT Start Time 7711  Patient arrived late   PT Stop Time 1431   PT Time Calculation (min) 32 min   Equipment Utilized During Treatment Gait belt   Activity Tolerance Patient tolerated treatment well;No increased pain   Behavior During Therapy WFL for tasks assessed/performed      Past Medical History:  Diagnosis Date  . Biatrial enlargement 12/01/2015  . Chronic anticoagulation    coumadin, CHADS2VASC=6  . History of mitral valve replacement   . Hypertension   . Hypothyroidism   . Osteoporosis    on Boniva  . Permanent atrial fibrillation ([place])   . Pulmonary hypertension, moderate to severe ([place]) 12/01/2015  . Stroke [facility])    on Clopidrigel  . UTI (lower urinary tract infection) 07/2015   n&v; AKI  . Vascular dementia     Past Surgical History:  Procedure Laterality Date  . ABDOMINAL HYSTERECTOMY    . FRACTURE SURGERY  [id number]   Operative repair with intramedullary nail intratrochanteric Left Hip fracture  . INTRAMEDULLARY (IM) NAIL INTERTROCHANTERIC Left 12/01/2015   Procedure: INTRAMEDULLARY (IM) NAIL INTERTROCHANTRIC;  Surgeon: Carole Civil, MD;  Location: [facility];  Service: Orthopedics;  Laterality: Left;  . MITRAL VALVE REPLACEMENT      There were no vitals filed for this visit.      Subjective Assessment - 03/05/16 1405    Subjective Son states that he has found pt ambulating in the house without her cane, and she is a little  wobbly, but she does it.  Pt also negotiated the steps on her own - one at a time.    Pertinent History L THA April 2017; Post hip precautions; some memory deficts from remote CVA.    How long can you sit comfortably? no problem    How long can you walk comfortably? She is now using her walker for 15-20 minutes    Patient Stated Goals To be able to walk without her walker, to be able to go up and down steps, walking for exercise, going back to the gym    Pain Score 0-No pain   Pain Onset More than a month ago        Therapeutic Exercises Supine: Bridges for strengthening 2 sets of 15 reps with 5 second hold.  Sidelying: Clams x 20 reps bilaterally, with pt having and increased time laying on L side  Standing:  Hip abduction x 10 reps bilaterally with B UE support Hip extension x 10 reps bilaterally for strengthening with tc's to prevent trunk flexion, B UE support Step down x 10 reps on the 4" step with heel taps - vc's for wide BOS, B UE support         PT Education - 03/05/16 1600    Education provided Yes   Education Details Discussed importance of activating gluteal muscles during mobility   Person(s) Educated Patient;Caregiver(s)   Methods Explanation;Demonstration;Handout  Comprehension Verbalized understanding;Returned demonstration;Tactile cues required;Need further instruction          PT Short Term Goals - 03/05/16 1600      PT SHORT TERM GOAL #1   Title Pt to be able to single leg stance for 5-10 seconds on both legs to allow pt to feel confident ambulating with a cane indoors    Baseline 02/28/2016:  Rt 3", Lt 2" max of 3   Time 3   Period Weeks   Status On-going     PT SHORT TERM GOAL #2   Title Pt strength in her left leg to be increased 1/2 grade to allow pt to be able to lift her leg easily in and out of the bed/car.   Time 3   Period Weeks   Status On-going     PT SHORT TERM GOAL #3   Title Pt to be able to come from sit to stand from her couch  with ease.   Baseline 02/28/2016: 5 STS 12.65"   Time 3   Period Weeks   Status Achieved     PT SHORT TERM GOAL #4   Title Pt pain in her Lt hip to be no greater than a 3/10 to allow pt to be able to walk for 15 minutes without difficulty with a cane    Baseline 02/28/2016: No reports of pain in a month   Time 3   Period Weeks   Status Achieved           PT Long Term Goals - 03/05/16 1600      PT LONG TERM GOAL #1   Title Pt to be able to single leg stance for 15- 20 seconds on both legs  to allow pt to feel confident walking with no assistive device on flat terrain    Time 6   Period Weeks   Status Not Met     PT LONG TERM GOAL #2   Title Pt strength in her left LE to be increased by one grade to allow pt to go up and down 10 steps in a reciprocal manner to get into her bedroom    Time 6   Period Weeks   Status On-going     PT LONG TERM GOAL #3   Title Pt pain to be at a 1/10 or less in her left hip to allow her to walk for 40 minutes at a time without stopping to get back to walking for exercise for improved health benefits.    Period Weeks   Status On-going               Plan - 03/05/16 1600   Clinical Impression Statement Continued focus on gluteal strengthening as well as hip abductors.   Therapist assisted with proper technique regarding positioning for exercises.  Pt continues to fatigue with exercises.  Pt would benefit from continued PT to improve strength, balance and endurance required for completion of ADL's and IADL's.     Rehab Potential Good   PT Frequency 2x / week   PT Duration 6 weeks   PT Treatment/Interventions ADLs/Self Care Home Management;Gait training;Stair training;Functional mobility training;Therapeutic activities;Therapeutic exercise;Balance training;Neuromuscular re-education;Patient/family education;Manual techniques   PT Next Visit Plan Next session focus on gluteal strenghtening with OKC and CKC.  Continue SPC with negotiating  obstacles, balance training and functional strengthening.  Progress SLS activites (hurdles, marching, etc) and functional strengtheing with step downs     PT [place]  Consulted and Agree with Plan of Care Pt/family member   Family Member Consulted son      Patient will benefit from skilled therapeutic intervention in order to improve the following deficits and impairments:  Abnormal gait, Decreased balance, Decreased activity tolerance, Decreased strength, Postural dysfunction, Pain, Difficulty walking  Visit Diagnosis: Pain in left hip  History of falling  Unsteadiness on feet  Muscle weakness (generalized)  Difficulty in walking, not elsewhere classified     Problem List Patient Active Problem List   Diagnosis Date Noted  . Cough 12/16/2015  . Osteoporosis 12/05/2015  . Biatrial enlargement 12/01/2015  . Pulmonary hypertension, moderate to severe ([place]) 12/01/2015  . Closed fracture of intertrochanteric section of femur ([place])   . Chronic atrial fibrillation ([place]) 11/30/2015  . Hypothyroidism 11/29/2015  . Thrombocytopenia ([place]) 07/13/2015  . Chronic anticoagulation 07/13/2015  . H/O mitral valve replacement 07/13/2015  . Vascular dementia 07/12/2015  . Essential hypertension 07/12/2015    Ladoris Gene, PT, DPT 03/05/2016  [place] [address] [place], [place], [address] Phone: [phone]   Fax:  [phone]  Name: Courtney [name] MRN: [id number] Date of Birth: [birth year]

## 2016-03-07 ENCOUNTER — Ambulatory Visit (HOSPITAL_COMMUNITY): Payer: Medicare Other | Admitting: Physical Therapy

## 2016-03-07 DIAGNOSIS — M25552 Pain in left hip: Secondary | ICD-10-CM

## 2016-03-07 DIAGNOSIS — M6281 Muscle weakness (generalized): Secondary | ICD-10-CM

## 2016-03-07 DIAGNOSIS — R262 Difficulty in walking, not elsewhere classified: Secondary | ICD-10-CM

## 2016-03-07 DIAGNOSIS — Z9181 History of falling: Secondary | ICD-10-CM

## 2016-03-07 DIAGNOSIS — R2681 Unsteadiness on feet: Secondary | ICD-10-CM

## 2016-03-07 NOTE — Therapy (Signed)
Courtney [name], [place], [address] Phone: [phone]   Fax:  [phone]  Physical Therapy Treatment  Patient Details  Name: Courtney [name] MRN: [id number] Date of Birth: May 13, 1929 Referring Provider: Dr.  Garrison Columbus   Encounter Date: 03/07/2016      PT End of Session - 03/07/16 1643    Visit Number 11   Number of Visits 19   Date for PT Re-Evaluation 03/07/16   Authorization Type medicare; G code done visit 11   Authorization - Visit Number 11   Authorization - Number of Visits 19   PT Start Time 7342  pt late   PT Stop Time 1430   PT Time Calculation (min) 35 min   Equipment Utilized During Treatment Gait belt   Activity Tolerance Patient tolerated treatment well;No increased pain   Behavior During Therapy WFL for tasks assessed/performed      Past Medical History:  Diagnosis Date  . Biatrial enlargement 12/01/2015  . Chronic anticoagulation    coumadin, CHADS2VASC=6  . History of mitral valve replacement   . Hypertension   . Hypothyroidism   . Osteoporosis    on Boniva  . Permanent atrial fibrillation ([place])   . Pulmonary hypertension, moderate to severe ([place]) 12/01/2015  . Stroke [facility])    on Clopidrigel  . UTI (lower urinary tract infection) 07/2015   n&v; AKI  . Vascular dementia     Past Surgical History:  Procedure Laterality Date  . ABDOMINAL HYSTERECTOMY    . FRACTURE SURGERY  [id number]   Operative repair with intramedullary nail intratrochanteric Left Hip fracture  . INTRAMEDULLARY (IM) NAIL INTERTROCHANTERIC Left 12/01/2015   Procedure: INTRAMEDULLARY (IM) NAIL INTERTROCHANTRIC;  Surgeon: Carole Civil, MD;  Location: [facility];  Service: Orthopedics;  Laterality: Left;  . MITRAL VALVE REPLACEMENT      There were no vitals filed for this visit.      Subjective Assessment - 03/07/16 1404    Subjective Pt states that she is still having difficulty going up the stairs she is going up  them one at a time for safety.  She is working out at [employer].     Pertinent History L THA April 2017; Post hip precautions; some memory deficts from remote CVA.    How long can you sit comfortably? No problem    How long can you walk comfortably? able to walk for 30 mintues with her cane was 15-20 minutes with a walker    Patient Stated Goals Goals were to walk with a cane, met; go up and down steps (doing but one at a time), and to be going to the gym which she is doing    Currently in Pain? No/denies            [facility] PT Assessment - 03/07/16 0001      Assessment   Medical Diagnosis Lt hip ORIF   Referring Provider Dr.  Garrison Columbus    Onset Date/Surgical Date 12/01/15   Prior Therapy SNF until 01/23/2016     Precautions   Precautions None     Restrictions   Weight Bearing Restrictions No     Home Environment   Living Environment Private residence   Home Access Level entry   [place] Two level   Alternate Level Stairs-Number of Steps 14     Prior Function   Level of [place] Retired   Leisure walking for exercises; goes to fitness  center      Cognition   Overall Cognitive Status Impaired/Different from baseline  slight dementia      Observation/Other Assessments   Focus on Therapeutic Outcomes (FOTO)  44 was 30     Functional Tests   Functional tests Single leg stance;Sit to Stand     Single Leg Stance   Comments unable on either leg on 6/22;  2" B on 7/26  now  unable on either leg      Sit to Stand   Comments 16.7 on 6/22; 12.65 on 7/26 now 15.50     Strength   Right Hip Flexion 5/5  was 4/5    Right Hip Extension 3-/5  was 2/5   Right Hip ABduction 4/5  was 3+/5    Left Hip Flexion 5/5  was 5/5    Left Hip Extension 3-/5  was 2/5   Left Hip ABduction 3/5  was 3-/5   Right Knee Flexion 5/5  was 4/5   Right Knee Extension 5/5  ws 5/5    Left Knee Flexion 5/5  was 4-/5   Left Knee Extension 5/5  was 5/5                       [facility] Adult PT Treatment/Exercise - 03/07/16 0001      Knee/Hip Exercises: Standing   Heel Raises 10 reps     Knee/Hip Exercises: Supine   Bridges 15 reps     Knee/Hip Exercises: Sidelying   Hip ABduction Strengthening;Left;10 reps     Knee/Hip Exercises: Prone   Hip Extension Both;10 reps   Hip Extension Limitations knees straight                   PT Short Term Goals - 03/07/16 1422      PT SHORT TERM GOAL #1   Title Pt to be able to single leg stance for 5-10 seconds on both legs to allow pt to feel confident ambulating with a cane indoors    Baseline 02/28/2016:  Rt 3", Lt 2" max of 3   Time 3   Period Weeks   Status On-going     PT SHORT TERM GOAL #2   Title Pt strength in her left leg to be increased 1/2 grade to allow pt to be able to lift her leg easily in and out of the bed/car.   Time 3   Period Weeks   Status Achieved     PT SHORT TERM GOAL #3   Title Pt to be able to come from sit to stand from her couch with ease.   Time 3   Period Weeks   Status Achieved     PT SHORT TERM GOAL #4   Title Pt pain in her Lt hip to be no greater than a 3/10 to allow pt to be able to walk for 15 minutes without difficulty with a cane    Baseline 02/28/2016: No reports of pain in a month   Time 3   Period Weeks   Status Achieved           PT Long Term Goals - 03/07/16 1422      PT LONG TERM GOAL #1   Title Pt to be able to single leg stance for 15- 20 seconds on both legs  to allow pt to feel confident walking with no assistive device on flat terrain    Time 6   Period Weeks  Status Not Met     PT LONG TERM GOAL #2   Title Pt strength in her left LE to be increased by one grade to allow pt to go up and down 10 steps in a reciprocal manner to get into her bedroom    Time 6   Period Weeks   Status On-going     PT LONG TERM GOAL #3   Title Pt pain to be at a 1/10 or less in her left hip to allow her to walk for 40  minutes at a time without stopping to get back to walking for exercise for improved health benefits.    Period Weeks   Status Achieved               Plan - Mar 15, 2016 1639    Clinical Impression Statement Pt reassessed this visit.  She has made good gains in all muscular strength except gluteal maximus.  Balance is still a significant concern.  [name] will benefit from continued skilled therapy to improve these deficits.     Rehab Potential Good   PT Frequency 2x / week   PT Duration 6 weeks   PT Treatment/Interventions ADLs/Self Care Home Management;Gait training;Stair training;Functional mobility training;Therapeutic activities;Therapeutic exercise;Balance training;Neuromuscular re-education;Patient/family education;Manual techniques   PT Next Visit Plan Continue to focus on functional gluteal strengthening, and improved dynamic SLS activities.     Consulted and Agree with Plan of Care Family member/caregiver;Patient   Family Member Consulted son       Patient will benefit from skilled therapeutic intervention in order to improve the following deficits and impairments:  Abnormal gait, Decreased balance, Decreased activity tolerance, Decreased strength, Postural dysfunction, Pain, Difficulty walking  Visit Diagnosis: Pain in left hip  History of falling  Unsteadiness on feet  Muscle weakness (generalized)  Difficulty in walking, not elsewhere classified       G-Codes - Mar 15, 2016 1641    Functional Limitation Mobility: Walking and moving around   Mobility: Walking and Moving Around Current Status [phone]) At least 40 percent but less than 60 percent impaired, limited or restricted   Mobility: Walking and Moving Around Goal Status [phone]) At least 20 percent but less than 40 percent impaired, limited or restricted      Problem List Patient Active Problem List   Diagnosis Date Noted  . Cough 12/16/2015  . Osteoporosis 12/05/2015  . Biatrial enlargement 12/01/2015  .  Pulmonary hypertension, moderate to severe ([place]) 12/01/2015  . Closed fracture of intertrochanteric section of femur ([place])   . Chronic atrial fibrillation ([place]) 11/30/2015  . Hypothyroidism 11/29/2015  . Thrombocytopenia ([place]) 07/13/2015  . Chronic anticoagulation 07/13/2015  . H/O mitral valve replacement 07/13/2015  . Vascular dementia 07/12/2015  . Essential hypertension 07/12/2015   Rayetta Humphrey, PT CLT [phone] 2016/03/15, 4:43 PM  [facility] [address] [place], [place], [address] Phone: [phone]   Fax:  [phone]  Name: Courtney [name] MRN: [id number] Date of Birth: 08/11/1928

## 2016-03-12 ENCOUNTER — Ambulatory Visit (HOSPITAL_COMMUNITY): Payer: Medicare Other | Admitting: Physical Therapy

## 2016-03-12 DIAGNOSIS — R2681 Unsteadiness on feet: Secondary | ICD-10-CM

## 2016-03-12 DIAGNOSIS — M25552 Pain in left hip: Secondary | ICD-10-CM | POA: Diagnosis not present

## 2016-03-12 DIAGNOSIS — Z9181 History of falling: Secondary | ICD-10-CM

## 2016-03-12 DIAGNOSIS — R262 Difficulty in walking, not elsewhere classified: Secondary | ICD-10-CM

## 2016-03-12 DIAGNOSIS — M6281 Muscle weakness (generalized): Secondary | ICD-10-CM

## 2016-03-12 NOTE — Therapy (Signed)
[place], [place], [address] Phone: [phone]   Fax:  [phone]  Physical Therapy Treatment  Patient Details  Name: Courtney [name] MRN: [id number] Date of Birth: [birth year] Referring Provider: Dr.  Garrison Columbus   Encounter Date: 03/12/2016      PT End of Session - 03/12/16 1518    Visit Number 12   Number of Visits 19   Date for PT Re-Evaluation 03/07/16   Authorization Type medicare; G code done visit 11   Authorization - Visit Number 12   Authorization - Number of Visits 19   PT Start Time 3845   PT Stop Time 1434   PT Time Calculation (min) 40 min   Equipment Utilized During Treatment Gait belt   Activity Tolerance Patient tolerated treatment well;No increased pain   Behavior During Therapy WFL for tasks assessed/performed      Past Medical History:  Diagnosis Date  . Biatrial enlargement 12/01/2015  . Chronic anticoagulation    coumadin, CHADS2VASC=6  . History of mitral valve replacement   . Hypertension   . Hypothyroidism   . Osteoporosis    on Boniva  . Permanent atrial fibrillation ([place])   . Pulmonary hypertension, moderate to severe ([place]) 12/01/2015  . Stroke [facility])    on Clopidrigel  . UTI (lower urinary tract infection) 07/2015   n&v; AKI  . Vascular dementia     Past Surgical History:  Procedure Laterality Date  . ABDOMINAL HYSTERECTOMY    . FRACTURE SURGERY  [id number]   Operative repair with intramedullary nail intratrochanteric Left Hip fracture  . INTRAMEDULLARY (IM) NAIL INTERTROCHANTERIC Left 12/01/2015   Procedure: INTRAMEDULLARY (IM) NAIL INTERTROCHANTRIC;  Surgeon: Carole Civil, MD;  Location: [facility];  Service: Orthopedics;  Laterality: Left;  . MITRAL VALVE REPLACEMENT      There were no vitals filed for this visit.      Subjective Assessment - 03/12/16 1405    Subjective Pt states balance is still her biggest problem   Pertinent History L THA April 2017; Post hip  precautions; some memory deficts from remote CVA.    How long can you sit comfortably? No problem    How long can you walk comfortably? able to walk for 30 mintues with her cane was 15-20 minutes with a walker    Patient Stated Goals Goals were to walk with a cane, met; go up and down steps (doing but one at a time), and to be going to the gym which she is doing    Currently in Pain? No/denies                         [facility] Adult PT Treatment/Exercise - 03/12/16 0001      Knee/Hip Exercises: Standing   Heel Raises 10 reps   Terminal Knee Extension Limitations 10 with ball    Stairs 1 RT    Other Standing Knee Exercises abduction isometric into wall x 10      Knee/Hip Exercises: Seated   Sit to Sand 10 reps             Balance Exercises - 03/12/16 1406      Balance Exercises: Standing   Tandem Stance Eyes open;2 reps;30 secs   SLS Eyes open;3 reps   Tandem Gait Forward;2 reps   Sidestepping 1 rep  green t-band    Marching Limitations x10  PT Short Term Goals - 03/12/16 1520      PT SHORT TERM GOAL #1   Title Pt to be able to single leg stance for 5-10 seconds on both legs to allow pt to feel confident ambulating with a cane indoors    Baseline 02/28/2016:  Rt 3", Lt 2" max of 3   Time 3   Period Weeks   Status On-going     PT SHORT TERM GOAL #2   Title Pt strength in her left leg to be increased 1/2 grade to allow pt to be able to lift her leg easily in and out of the bed/car.   Time 3   Period Weeks   Status Achieved     PT SHORT TERM GOAL #3   Title Pt to be able to come from sit to stand from her couch with ease.   Time 3   Period Weeks   Status Achieved     PT SHORT TERM GOAL #4   Title Pt pain in her Lt hip to be no greater than a 3/10 to allow pt to be able to walk for 15 minutes without difficulty with a cane    Baseline 02/28/2016: No reports of pain in a month   Time 3   Period Weeks   Status Achieved            PT Long Term Goals - 03/12/16 1521      PT LONG TERM GOAL #1   Title Pt to be able to single leg stance for 15- 20 seconds on both legs  to allow pt to feel confident walking with no assistive device on flat terrain    Time 6   Period Weeks   Status Not Met     PT LONG TERM GOAL #2   Title Pt strength in her left LE to be increased by one grade to allow pt to go up and down 10 steps in a reciprocal manner to get into her bedroom    Time 6   Period Weeks   Status On-going     PT LONG TERM GOAL #3   Title Pt pain to be at a 1/10 or less in her left hip to allow her to walk for 40 minutes at a time without stopping to get back to walking for exercise for improved health benefits.    Period Weeks   Status Achieved               Plan - 03/12/16 1518    Clinical Impression Statement Session concentrated on balance as well as hip abductor strength with therapist facilitation for all exercises for safety.  Began standing terminal extension to attempt to straighten knee while ambulating.   Rehab Potential Good   PT Frequency 2x / week   PT Duration 6 weeks   PT Treatment/Interventions ADLs/Self Care Home Management;Gait training;Stair training;Functional mobility training;Therapeutic activities;Therapeutic exercise;Balance training;Neuromuscular re-education;Patient/family education;Manual techniques   PT Next Visit Plan Continue to focus on functional gluteal strengthening, and improved dynamic SLS activities.     Consulted and Agree with Plan of Care Family member/caregiver;Patient   Family Member Consulted son       Patient will benefit from skilled therapeutic intervention in order to improve the following deficits and impairments:  Abnormal gait, Decreased balance, Decreased activity tolerance, Decreased strength, Postural dysfunction, Pain, Difficulty walking  Visit Diagnosis: Pain in left hip  History of falling  Unsteadiness on feet  Muscle weakness  (generalized)  Difficulty in walking, not elsewhere classified     Problem List Patient Active Problem List   Diagnosis Date Noted  . Cough 12/16/2015  . Osteoporosis 12/05/2015  . Biatrial enlargement 12/01/2015  . Pulmonary hypertension, moderate to severe ([place]) 12/01/2015  . Closed fracture of intertrochanteric section of femur ([place])   . Chronic atrial fibrillation ([place]) 11/30/2015  . Hypothyroidism 11/29/2015  . Thrombocytopenia ([place]) 07/13/2015  . Chronic anticoagulation 07/13/2015  . H/O mitral valve replacement 07/13/2015  . Vascular dementia 07/12/2015  . Essential hypertension 07/12/2015   Rayetta Humphrey, PT CLT [phone] 03/12/2016, 3:21 PM  [facility] [address] [place], [place], [address] Phone: [phone]   Fax:  [phone]  Name: Denaly Gatling MRN: [id number] Date of Birth: 03-Mar-1929

## 2016-03-14 ENCOUNTER — Ambulatory Visit (HOSPITAL_COMMUNITY): Payer: Medicare Other | Admitting: Physical Therapy

## 2016-03-14 DIAGNOSIS — M6281 Muscle weakness (generalized): Secondary | ICD-10-CM

## 2016-03-14 DIAGNOSIS — M25552 Pain in left hip: Secondary | ICD-10-CM | POA: Diagnosis not present

## 2016-03-14 DIAGNOSIS — R262 Difficulty in walking, not elsewhere classified: Secondary | ICD-10-CM

## 2016-03-14 DIAGNOSIS — R2681 Unsteadiness on feet: Secondary | ICD-10-CM

## 2016-03-14 DIAGNOSIS — Z9181 History of falling: Secondary | ICD-10-CM

## 2016-03-14 NOTE — Therapy (Signed)
[place], [place], [address] Phone: [phone]   Fax:  [phone]  Physical Therapy Treatment  Patient Details  Name: Courtney [name] MRN: [id number] Date of Birth: 02/26/1929 Referring Provider: Dr.  Garrison [place]   Encounter Date: 03/14/2016      PT End of Session - 03/14/16 1434    Visit Number 13   Number of Visits 19   Date for PT Re-Evaluation 03/07/16   Authorization Type medicare; G code done visit 11   Authorization - Visit Number 13   Authorization - Number of Visits 19   PT Start Time 4967   PT Stop Time 1434   PT Time Calculation (min) 39 min   Equipment Utilized During Treatment Gait belt   Activity Tolerance Patient tolerated treatment well;No increased pain   Behavior During Therapy WFL for tasks assessed/performed      Past Medical History:  Diagnosis Date  . Biatrial enlargement 12/01/2015  . Chronic anticoagulation    coumadin, CHADS2VASC=6  . History of mitral valve replacement   . Hypertension   . Hypothyroidism   . Osteoporosis    on Boniva  . Permanent atrial fibrillation ([place])   . Pulmonary hypertension, moderate to severe ([place]) 12/01/2015  . Stroke [facility])    on Clopidrigel  . UTI (lower urinary tract infection) 07/2015   n&v; AKI  . Vascular dementia     Past Surgical History:  Procedure Laterality Date  . ABDOMINAL HYSTERECTOMY    . FRACTURE SURGERY  [id number]   Operative repair with intramedullary nail intratrochanteric Left Hip fracture  . INTRAMEDULLARY (IM) NAIL INTERTROCHANTERIC Left 12/01/2015   Procedure: INTRAMEDULLARY (IM) NAIL INTERTROCHANTRIC;  Surgeon: Carole Civil, MD;  Location: [facility];  Service: Orthopedics;  Laterality: Left;  . MITRAL VALVE REPLACEMENT      There were no vitals filed for this visit.      Subjective Assessment - 03/14/16 1413    Subjective Pt states she is doing her exercises once a day at home.  She states that getting up from a  chair is easier for her.    Pertinent History L THA April 2017; Post hip precautions; some memory deficts from remote CVA.    How long can you sit comfortably? No problem    How long can you walk comfortably? able to walk for 30 mintues with her cane was 15-20 minutes with a walker    Patient Stated Goals Goals were to walk with a cane, met; go up and down steps (doing but one at a time), and to be going to the gym which she is doing    Currently in Pain? No/denies                         [facility] Adult PT Treatment/Exercise - 03/14/16 0001      Knee/Hip Exercises: Standing   Heel Raises 10 reps   Terminal Knee Extension Limitations 10 with ball    Stairs 1 RT    Other Standing Knee Exercises abduction isometric into wall x 10      Knee/Hip Exercises: Seated   Sit to Sand 10 reps             Balance Exercises - 03/14/16 1422      Balance Exercises: Standing   Standing Eyes Opened Narrow base of support (BOS);Head turns;5 reps   Tandem Stance Eyes open;2 reps;30 secs   SLS Eyes open;3 reps  Tandem Gait Forward;2 reps   Retro Gait 1 rep   Sidestepping 2 reps     Marching Limitations              PT Short Term Goals - 03/14/16 1435      PT SHORT TERM GOAL #1   Title Pt to be able to single leg stance for 5-10 seconds on both legs to allow pt to feel confident ambulating with a cane indoors    Baseline 02/28/2016:  Rt 3", Lt 2" max of 3   Time 3   Period Weeks   Status On-going     PT SHORT TERM GOAL #2   Title Pt strength in her left leg to be increased 1/2 grade to allow pt to be able to lift her leg easily in and out of the bed/car.   Time 3   Period Weeks   Status Achieved     PT SHORT TERM GOAL #3   Title Pt to be able to come from sit to stand from her couch with ease.   Time 3   Period Weeks   Status Achieved     PT SHORT TERM GOAL #4   Title Pt pain in her Lt hip to be no greater than a 3/10 to allow pt to be able to walk for 15  minutes without difficulty with a cane    Baseline 02/28/2016: No reports of pain in a month   Time 3   Period Weeks   Status Achieved           PT Long Term Goals - 03/14/16 1435      PT LONG TERM GOAL #1   Title Pt to be able to single leg stance for 15- 20 seconds on both legs  to allow pt to feel confident walking with no assistive device on flat terrain    Time 6   Period Weeks   Status Not Met     PT LONG TERM GOAL #2   Title Pt strength in her left LE to be increased by one grade to allow pt to go up and down 10 steps in a reciprocal manner to get into her bedroom    Time 6   Period Weeks   Status On-going     PT LONG TERM GOAL #3   Title Pt pain to be at a 1/10 or less in her left hip to allow her to walk for 40 minutes at a time without stopping to get back to walking for exercise for improved health benefits.    Period Weeks   Status Achieved               Plan - 03/14/16 1434    Clinical Impression Statement Session worked on heel to toe gait.  Added retro gt to improve balance , weight shifting and confidence.     Rehab Potential Good   PT Frequency 2x / week   PT Duration 6 weeks   PT Treatment/Interventions ADLs/Self Care Home Management;Gait training;Stair training;Functional mobility training;Therapeutic activities;Therapeutic exercise;Balance training;Neuromuscular re-education;Patient/family education;Manual techniques   PT Next Visit Plan Continue to focus on functional gluteal strengthening, and improved dynamic SLS activities.     Consulted and Agree with Plan of Care Family member/caregiver;Patient   Family Member Consulted son       Patient will benefit from skilled therapeutic intervention in order to improve the following deficits and impairments:  Abnormal gait, Decreased balance, Decreased activity tolerance, Decreased strength, Postural  dysfunction, Pain, Difficulty walking  Visit Diagnosis: Pain in left hip  History of  falling  Unsteadiness on feet  Muscle weakness (generalized)  Difficulty in walking, not elsewhere classified     Problem List Patient Active Problem List   Diagnosis Date Noted  . Cough 12/16/2015  . Osteoporosis 12/05/2015  . Biatrial enlargement 12/01/2015  . Pulmonary hypertension, moderate to severe ([place]) 12/01/2015  . Closed fracture of intertrochanteric section of femur ([place])   . Chronic atrial fibrillation ([place]) 11/30/2015  . Hypothyroidism 11/29/2015  . Thrombocytopenia ([place]) 07/13/2015  . Chronic anticoagulation 07/13/2015  . H/O mitral valve replacement 07/13/2015  . Vascular dementia 07/12/2015  . Essential hypertension 07/12/2015   Rayetta Humphrey, PT CLT [phone] 03/14/2016, 2:36 PM  [facility] [address] [place], [place], [address] Phone: [phone]   Fax:  [phone]  Name: Courtney [name] MRN: [id number] Date of Birth: 1928-09-19

## 2016-03-19 ENCOUNTER — Ambulatory Visit (HOSPITAL_COMMUNITY): Payer: Medicare Other | Admitting: Physical Therapy

## 2016-03-19 DIAGNOSIS — R262 Difficulty in walking, not elsewhere classified: Secondary | ICD-10-CM

## 2016-03-19 DIAGNOSIS — M25552 Pain in left hip: Secondary | ICD-10-CM | POA: Diagnosis not present

## 2016-03-19 DIAGNOSIS — Z9181 History of falling: Secondary | ICD-10-CM

## 2016-03-19 DIAGNOSIS — R2681 Unsteadiness on feet: Secondary | ICD-10-CM

## 2016-03-19 DIAGNOSIS — M6281 Muscle weakness (generalized): Secondary | ICD-10-CM

## 2016-03-19 NOTE — Therapy (Signed)
[place], [place], [address] Phone: [phone]   Fax:  [phone]  Physical Therapy Treatment  Patient Details  Name: Courtney [name] MRN: [id number] Date of Birth: 22-May-1929 Referring Provider: Dr.  Garrison [place]   Encounter Date: 03/19/2016      PT End of Session - 03/19/16 1511    Visit Number 14   Number of Visits 19   Date for PT Re-Evaluation 03/07/16   Authorization Type medicare; G code done visit 11   Authorization - Visit Number 14   Authorization - Number of Visits 19   PT Start Time 1430   PT Stop Time 1510   PT Time Calculation (min) 40 min   Equipment Utilized During Treatment Gait belt   Activity Tolerance Patient tolerated treatment well;No increased pain   Behavior During Therapy WFL for tasks assessed/performed      Past Medical History:  Diagnosis Date  . Biatrial enlargement 12/01/2015  . Chronic anticoagulation    coumadin, CHADS2VASC=6  . History of mitral valve replacement   . Hypertension   . Hypothyroidism   . Osteoporosis    on Boniva  . Permanent atrial fibrillation ([place])   . Pulmonary hypertension, moderate to severe ([place]) 12/01/2015  . Stroke [facility])    on Clopidrigel  . UTI (lower urinary tract infection) 07/2015   n&v; AKI  . Vascular dementia     Past Surgical History:  Procedure Laterality Date  . ABDOMINAL HYSTERECTOMY    . FRACTURE SURGERY  [id number]   Operative repair with intramedullary nail intratrochanteric Left Hip fracture  . INTRAMEDULLARY (IM) NAIL INTERTROCHANTERIC Left 12/01/2015   Procedure: INTRAMEDULLARY (IM) NAIL INTERTROCHANTRIC;  Surgeon: Carole Civil, MD;  Location: [facility];  Service: Orthopedics;  Laterality: Left;  . MITRAL VALVE REPLACEMENT      There were no vitals filed for this visit.                       [facility] Adult PT Treatment/Exercise - 03/19/16 0001      Knee/Hip Exercises: Standing   Hip Flexion Both;2  sets;10 reps;Knee bent  1 UE support    Hip Flexion Limitations on foam pad    Forward Step Up 2 sets;10 reps;Hand Hold: 1;Step Height: 4"  with foam pad, CGA   Other Standing Knee Exercises sidestepping in // bars x3 RT, green TB around knees      Knee/Hip Exercises: Seated   Sit to Sand 2 sets;15 reps;without UE support             Balance Exercises - 03/19/16 1510      Balance Exercises: Standing   SLS Eyes open;Solid surface;Intermittent upper extremity support;3 reps;20 secs           PT Education - 03/19/16 1510    Education provided Yes   Education Details technique with therex   Person(s) Educated Patient   Methods Explanation   Comprehension Verbalized understanding          PT Short Term Goals - 03/14/16 1435      PT SHORT TERM GOAL #1   Title Pt to be able to single leg stance for 5-10 seconds on both legs to allow pt to feel confident ambulating with a cane indoors    Baseline 02/28/2016:  Rt 3", Lt 2" max of 3   Time 3   Period Weeks   Status On-going     PT SHORT TERM  GOAL #2   Title Pt strength in her left leg to be increased 1/2 grade to allow pt to be able to lift her leg easily in and out of the bed/car.   Time 3   Period Weeks   Status Achieved     PT SHORT TERM GOAL #3   Title Pt to be able to come from sit to stand from her couch with ease.   Time 3   Period Weeks   Status Achieved     PT SHORT TERM GOAL #4   Title Pt pain in her Lt hip to be no greater than a 3/10 to allow pt to be able to walk for 15 minutes without difficulty with a cane    Baseline 02/28/2016: No reports of pain in a month   Time 3   Period Weeks   Status Achieved           PT Long Term Goals - 03/14/16 1435      PT LONG TERM GOAL #1   Title Pt to be able to single leg stance for 15- 20 seconds on both legs  to allow pt to feel confident walking with no assistive device on flat terrain    Time 6   Period Weeks   Status Not Met     PT LONG TERM  GOAL #2   Title Pt strength in her left LE to be increased by one grade to allow pt to go up and down 10 steps in a reciprocal manner to get into her bedroom    Time 6   Period Weeks   Status On-going     PT LONG TERM GOAL #3   Title Pt pain to be at a 1/10 or less in her left hip to allow her to walk for 40 minutes at a time without stopping to get back to walking for exercise for improved health benefits.    Period Weeks   Status Achieved               Plan - 03/19/16 1511    Clinical Impression Statement Today's session continued focus on therex to improve functional strength and balance. Pt demonstrated fatigue during today's session requiring several rest breaks. Pt reporting dizziness during abduction exercise, and vitals were taken. HR 65bpm, SaO2 93%. Pt encouraged to sit and SaO2 increased to 97% after 1-2 minutes with dizziness resolved, she also denied any other symptoms.  Discussed this with pt's son and encouraged he monitor symptoms at home and at the gym.    Rehab Potential Good   PT Frequency 2x / week   PT Duration 6 weeks   PT Treatment/Interventions ADLs/Self Care Home Management;Gait training;Stair training;Functional mobility training;Therapeutic activities;Therapeutic exercise;Balance training;Neuromuscular re-education;Patient/family education;Manual techniques   PT Next Visit Plan Continue to focus on functional gluteal strengthening, and improved dynamic SLS activities.     Consulted and Agree with Plan of Care Family member/caregiver;Patient   Family Member Consulted son       Patient will benefit from skilled therapeutic intervention in order to improve the following deficits and impairments:  Abnormal gait, Decreased balance, Decreased activity tolerance, Decreased strength, Postural dysfunction, Pain, Difficulty walking  Visit Diagnosis: Pain in left hip  History of falling  Unsteadiness on feet  Muscle weakness (generalized)  Difficulty in  walking, not elsewhere classified     Problem List Patient Active Problem List   Diagnosis Date Noted  . Cough 12/16/2015  . Osteoporosis 12/05/2015  . Biatrial  enlargement 12/01/2015  . Pulmonary hypertension, moderate to severe ([place]) 12/01/2015  . Closed fracture of intertrochanteric section of femur ([place])   . Chronic atrial fibrillation ([place]) 11/30/2015  . Hypothyroidism 11/29/2015  . Thrombocytopenia ([place]) 07/13/2015  . Chronic anticoagulation 07/13/2015  . H/O mitral valve replacement 07/13/2015  . Vascular dementia 07/12/2015  . Essential hypertension 07/12/2015    3:32 PM,03/19/16 Elly Modena PT, DPT [facility] [address] [place], [place], [address] Phone: [phone]   Fax:  [phone]  Name: Lorren Rossetti MRN: [id number] Date of Birth: 1928/10/07

## 2016-03-21 ENCOUNTER — Ambulatory Visit (HOSPITAL_COMMUNITY): Payer: Medicare Other | Admitting: Physical Therapy

## 2016-03-21 DIAGNOSIS — R262 Difficulty in walking, not elsewhere classified: Secondary | ICD-10-CM

## 2016-03-21 DIAGNOSIS — M25552 Pain in left hip: Secondary | ICD-10-CM

## 2016-03-21 DIAGNOSIS — R2681 Unsteadiness on feet: Secondary | ICD-10-CM

## 2016-03-21 DIAGNOSIS — Z9181 History of falling: Secondary | ICD-10-CM

## 2016-03-21 DIAGNOSIS — M6281 Muscle weakness (generalized): Secondary | ICD-10-CM

## 2016-03-21 NOTE — Therapy (Signed)
[place], [place], [address] Phone: [phone]   Fax:  [phone]  Physical Therapy Treatment  Patient Details  Name: Courtney [name] MRN: [id number] Date of Birth: 30-Oct-1928 Referring Provider: Dr.  Garrison [place]   Encounter Date: 03/21/2016      PT End of Session - 03/21/16 1731    Visit Number 15   Number of Visits 19   Date for PT Re-Evaluation 03/07/16   Authorization Type medicare; G code done visit 11   Authorization - Visit Number 5   Authorization - Number of Visits 19   PT Start Time 0354   PT Stop Time 6568   PT Time Calculation (min) 39 min   Equipment Utilized During Treatment Gait belt   Activity Tolerance Patient tolerated treatment well;No increased pain   Behavior During Therapy WFL for tasks assessed/performed      Past Medical History:  Diagnosis Date  . Biatrial enlargement 12/01/2015  . Chronic anticoagulation    coumadin, CHADS2VASC=6  . History of mitral valve replacement   . Hypertension   . Hypothyroidism   . Osteoporosis    on Boniva  . Permanent atrial fibrillation ([place])   . Pulmonary hypertension, moderate to severe ([place]) 12/01/2015  . Stroke [facility])    on Clopidrigel  . UTI (lower urinary tract infection) 07/2015   n&v; AKI  . Vascular dementia     Past Surgical History:  Procedure Laterality Date  . ABDOMINAL HYSTERECTOMY    . FRACTURE SURGERY  [id number]   Operative repair with intramedullary nail intratrochanteric Left Hip fracture  . INTRAMEDULLARY (IM) NAIL INTERTROCHANTERIC Left 12/01/2015   Procedure: INTRAMEDULLARY (IM) NAIL INTERTROCHANTRIC;  Surgeon: Carole Civil, MD;  Location: [facility];  Service: Orthopedics;  Laterality: Left;  . MITRAL VALVE REPLACEMENT      There were no vitals filed for this visit.                       [facility] Adult PT Treatment/Exercise - 03/21/16 0001      Knee/Hip Exercises: Standing   Heel Raises 15 reps;Both    Heel Raises Limitations toe raises   Other Standing Knee Exercises hip hike from 2" step x10 reps     Manual Therapy   Manual Therapy Soft tissue mobilization   Manual therapy comments separate from all other interventions    Soft tissue mobilization Lt glute med              Balance Exercises - 03/21/16 1626      Balance Exercises: Standing   Standing Eyes Opened Narrow base of support (BOS);Foam/compliant surface  trunk rotation Lt/Rt x10 reps   Standing Eyes Closed Narrow base of support (BOS);2 reps;30 secs   Tandem Stance Eyes open;3 reps;30 secs  CGA to MinA; increased difficulty LLE forward    Tandem Gait 2 reps  partial tandem gait   Retro Gait 2 reps   Sidestepping 2 reps           PT Education - 03/21/16 1707    Education provided Yes   Education Details therex technique; discussed Recruitment consultant) Educated Patient   Methods Explanation;Verbal cues;Tactile cues   Comprehension Verbalized understanding          PT Short Term Goals - 03/14/16 1435      PT SHORT TERM GOAL #1   Title Pt to be able to single leg stance for 5-10 seconds  on both legs to allow pt to feel confident ambulating with a cane indoors    Baseline 02/28/2016:  Rt 3", Lt 2" max of 3   Time 3   Period Weeks   Status On-going     PT SHORT TERM GOAL #2   Title Pt strength in her left leg to be increased 1/2 grade to allow pt to be able to lift her leg easily in and out of the bed/car.   Time 3   Period Weeks   Status Achieved     PT SHORT TERM GOAL #3   Title Pt to be able to come from sit to stand from her couch with ease.   Time 3   Period Weeks   Status Achieved     PT SHORT TERM GOAL #4   Title Pt pain in her Lt hip to be no greater than a 3/10 to allow pt to be able to walk for 15 minutes without difficulty with a cane    Baseline 02/28/2016: No reports of pain in a month   Time 3   Period Weeks   Status Achieved           PT Long Term Goals -  03/14/16 1435      PT LONG TERM GOAL #1   Title Pt to be able to single leg stance for 15- 20 seconds on both legs  to allow pt to feel confident walking with no assistive device on flat terrain    Time 6   Period Weeks   Status Not Met     PT LONG TERM GOAL #2   Title Pt strength in her left LE to be increased by one grade to allow pt to go up and down 10 steps in a reciprocal manner to get into her bedroom    Time 6   Period Weeks   Status On-going     PT LONG TERM GOAL #3   Title Pt pain to be at a 1/10 or less in her left hip to allow her to walk for 40 minutes at a time without stopping to get back to walking for exercise for improved health benefits.    Period Weeks   Status Achieved               Plan - 03/21/16 1731    Clinical Impression Statement Continued focus on static and dynamic balance this visit with pt requiring up to MinA at times to correct LOB. Pt's son reporting she leans when walking without her cane which is likely due to glute med. weakness. Pt tolerating activity this session without report of dizziness. Will continue with current POC.   Rehab Potential Good   PT Frequency 2x / week   PT Duration 6 weeks   PT Treatment/Interventions ADLs/Self Care Home Management;Gait training;Stair training;Functional mobility training;Therapeutic activities;Therapeutic exercise;Balance training;Neuromuscular re-education;Patient/family education;Manual techniques   PT Next Visit Plan Continue to focus on functional gluteal strengthening, and improved dynamic SLS activities.     Consulted and Agree with Plan of Care Patient;Family member/caregiver   Family Member Consulted son       Patient will benefit from skilled therapeutic intervention in order to improve the following deficits and impairments:  Abnormal gait, Decreased balance, Decreased activity tolerance, Decreased strength, Postural dysfunction, Pain, Difficulty walking  Visit Diagnosis: Pain in left  hip  History of falling  Unsteadiness on feet  Muscle weakness (generalized)  Difficulty in walking, not elsewhere classified  Problem List Patient Active Problem List   Diagnosis Date Noted  . Cough 12/16/2015  . Osteoporosis 12/05/2015  . Biatrial enlargement 12/01/2015  . Pulmonary hypertension, moderate to severe ([place]) 12/01/2015  . Closed fracture of intertrochanteric section of femur ([place])   . Chronic atrial fibrillation ([place]) 11/30/2015  . Hypothyroidism 11/29/2015  . Thrombocytopenia ([place]) 07/13/2015  . Chronic anticoagulation 07/13/2015  . H/O mitral valve replacement 07/13/2015  . Vascular dementia 07/12/2015  . Essential hypertension 07/12/2015    5:41 PM,03/21/16 Elly Modena PT, DPT [facility] [place] [address] [place], [place], [address] Phone: [phone]   Fax:  [phone]  Name: Courtney [name] MRN: [id number] Date of Birth: 1929/03/28

## 2016-03-25 ENCOUNTER — Telehealth (HOSPITAL_COMMUNITY): Payer: Self-pay

## 2016-03-25 NOTE — Telephone Encounter (Signed)
Son left a message that his mom was in [place], [place] getting lab work and wouldn't be back until [employer].

## 2016-03-26 ENCOUNTER — Encounter (HOSPITAL_COMMUNITY): Payer: Medicare Other | Admitting: Physical Therapy

## 2016-03-28 ENCOUNTER — Ambulatory Visit (HOSPITAL_COMMUNITY): Payer: Medicare Other | Admitting: Physical Therapy

## 2016-03-28 DIAGNOSIS — M25552 Pain in left hip: Secondary | ICD-10-CM | POA: Diagnosis not present

## 2016-03-28 DIAGNOSIS — Z9181 History of falling: Secondary | ICD-10-CM

## 2016-03-28 DIAGNOSIS — M6281 Muscle weakness (generalized): Secondary | ICD-10-CM

## 2016-03-28 DIAGNOSIS — R2681 Unsteadiness on feet: Secondary | ICD-10-CM

## 2016-03-28 DIAGNOSIS — R262 Difficulty in walking, not elsewhere classified: Secondary | ICD-10-CM

## 2016-03-28 NOTE — Therapy (Signed)
[facility] [address] [place], [place], [address] Phone: [phone]   Fax:  [phone]  Physical Therapy Treatment  Patient Details  Name: Courtney [name] MRN: [id number] Date of Birth: 1929/01/12 Referring Provider: Dr.  Garrison [place]   Encounter Date: 03/28/2016      PT End of Session - 03/28/16 1549    Visit Number 16   Number of Visits 19   Date for PT Re-Evaluation 03/07/16   Authorization Type medicare; G code done visit 11   Authorization - Visit Number 16   Authorization - Number of Visits 21   PT Start Time 1350   PT Stop Time 1430   PT Time Calculation (min) 40 min   Equipment Utilized During Treatment Gait belt   Activity Tolerance Patient tolerated treatment well;No increased pain   Behavior During Therapy WFL for tasks assessed/performed      Past Medical History:  Diagnosis Date  . Biatrial enlargement 12/01/2015  . Chronic anticoagulation    coumadin, CHADS2VASC=6  . History of mitral valve replacement   . Hypertension   . Hypothyroidism   . Osteoporosis    on Boniva  . Permanent atrial fibrillation ([place])   . Pulmonary hypertension, moderate to severe ([place]) 12/01/2015  . Stroke [facility])    on Clopidrigel  . UTI (lower urinary tract infection) 07/2015   n&v; AKI  . Vascular dementia     Past Surgical History:  Procedure Laterality Date  . ABDOMINAL HYSTERECTOMY    . FRACTURE SURGERY  [id number]   Operative repair with intramedullary nail intratrochanteric Left Hip fracture  . INTRAMEDULLARY (IM) NAIL INTERTROCHANTERIC Left 12/01/2015   Procedure: INTRAMEDULLARY (IM) NAIL INTERTROCHANTRIC;  Surgeon: Carole Civil, MD;  Location: [facility];  Service: Orthopedics;  Laterality: Left;  . MITRAL VALVE REPLACEMENT      There were no vitals filed for this visit.      Subjective Assessment - 03/28/16 1409    Subjective Son states she met some personal records at the [facility] yesterday.  Pt states she is having no  pain or difficulty.  Son reports balance is still her major deficit.     Currently in Pain? No/denies                              Balance Exercises - 03/28/16 1409      Balance Exercises: Standing   Balance Beam tandem forward, sidestepping 1RT each   Tandem Gait 2 reps   Retro Gait 2 reps   Sidestepping 2 reps   Heel Raises Limitations heelwalk 1 RT   Toe Raise Limitations toewalk 1RT             PT Short Term Goals - 03/14/16 1435      PT SHORT TERM GOAL #1   Title Pt to be able to single leg stance for 5-10 seconds on both legs to allow pt to feel confident ambulating with a cane indoors    Baseline 02/28/2016:  Rt 3", Lt 2" max of 3   Time 3   Period Weeks   Status On-going     PT SHORT TERM GOAL #2   Title Pt strength in her left leg to be increased 1/2 grade to allow pt to be able to lift her leg easily in and out of the bed/car.   Time 3   Period Weeks   Status Achieved     PT  SHORT TERM GOAL #3   Title Pt to be able to come from sit to stand from her couch with ease.   Time 3   Period Weeks   Status Achieved     PT SHORT TERM GOAL #4   Title Pt pain in her Lt hip to be no greater than a 3/10 to allow pt to be able to walk for 15 minutes without difficulty with a cane    Baseline 02/28/2016: No reports of pain in a month   Time 3   Period Weeks   Status Achieved           PT Long Term Goals - 03/14/16 1435      PT LONG TERM GOAL #1   Title Pt to be able to single leg stance for 15- 20 seconds on both legs  to allow pt to feel confident walking with no assistive device on flat terrain    Time 6   Period Weeks   Status Not Met     PT LONG TERM GOAL #2   Title Pt strength in her left LE to be increased by one grade to allow pt to go up and down 10 steps in a reciprocal manner to get into her bedroom    Time 6   Period Weeks   Status On-going     PT LONG TERM GOAL #3   Title Pt pain to be at a 1/10 or less in her left hip  to allow her to walk for 40 minutes at a time without stopping to get back to walking for exercise for improved health benefits.    Period Weeks   Status Achieved               Plan - 03/28/16 1551    Clinical Impression Statement focuses session on dynamic balance actvities as son reports this is her biggest limitation.  pt with most difficulty completing the balance beam in lateral direction and retro ambulation on solid surface.  Pt required cues to keep gaze ahead and general posture.  Pt required 4 seated rest breaks during session today but no c/o pain or noted difficullties.     Rehab Potential Good   PT Frequency 2x / week   PT Duration 6 weeks   PT Treatment/Interventions ADLs/Self Care Home Management;Gait training;Stair training;Functional mobility training;Therapeutic activities;Therapeutic exercise;Balance training;Neuromuscular re-education;Patient/family education;Manual techniques   PT Next Visit Plan Continue to focus on functional gluteal strengthening, and improved dynamic SLS activities.  Monitor SPO2 levels during session.   Consulted and Agree with Plan of Care Patient;Family member/caregiver   Family Member Consulted son       Patient will benefit from skilled therapeutic intervention in order to improve the following deficits and impairments:  Abnormal gait, Decreased balance, Decreased activity tolerance, Decreased strength, Postural dysfunction, Pain, Difficulty walking  Visit Diagnosis: Pain in left hip  History of falling  Unsteadiness on feet  Muscle weakness (generalized)  Difficulty in walking, not elsewhere classified     Problem List Patient Active Problem List   Diagnosis Date Noted  . Cough 12/16/2015  . Osteoporosis 12/05/2015  . Biatrial enlargement 12/01/2015  . Pulmonary hypertension, moderate to severe ([place]) 12/01/2015  . Closed fracture of intertrochanteric section of femur ([place])   . Chronic atrial fibrillation ([place]) 11/30/2015   . Hypothyroidism 11/29/2015  . Thrombocytopenia ([place]) 07/13/2015  . Chronic anticoagulation 07/13/2015  . H/O mitral valve replacement 07/13/2015  . Vascular dementia 07/12/2015  . Essential hypertension  07/12/2015    Teena Irani, PTA/CLT [phone]  03/28/2016, 3:54 PM  [facility] [address] [place], [place], [address] Phone: [phone]   Fax:  [phone]  Name: Courtney [name] MRN: [id number] Date of Birth: 1929-01-06

## 2016-04-02 ENCOUNTER — Ambulatory Visit (HOSPITAL_COMMUNITY): Payer: Medicare Other | Admitting: Physical Therapy

## 2016-04-02 DIAGNOSIS — M25552 Pain in left hip: Secondary | ICD-10-CM

## 2016-04-02 DIAGNOSIS — R262 Difficulty in walking, not elsewhere classified: Secondary | ICD-10-CM

## 2016-04-02 DIAGNOSIS — Z9181 History of falling: Secondary | ICD-10-CM

## 2016-04-02 DIAGNOSIS — M6281 Muscle weakness (generalized): Secondary | ICD-10-CM

## 2016-04-02 DIAGNOSIS — R2681 Unsteadiness on feet: Secondary | ICD-10-CM

## 2016-04-02 NOTE — Therapy (Signed)
[facility] [place], [place], [address] Phone: [phone]   Fax:  [phone]  Physical Therapy Treatment  Patient Details  Name: Courtney [name] MRN: [id number] Date of Birth: January 28, 1929 Referring Provider: Dr.  Garrison Columbus   Encounter Date: 04/02/2016      PT End of Session - 04/02/16 1517    Visit Number 17   Number of Visits 19   Date for PT Re-Evaluation 03/07/16   Authorization Type medicare; G code done visit 11   Authorization - Visit Number 17   Authorization - Number of Visits 21   PT Start Time 1245   PT Stop Time 1515   PT Time Calculation (min) 43 min   Equipment Utilized During Treatment Gait belt   Activity Tolerance Patient tolerated treatment well;No increased pain   Behavior During Therapy WFL for tasks assessed/performed      Past Medical History:  Diagnosis Date  . Biatrial enlargement 12/01/2015  . Chronic anticoagulation    coumadin, CHADS2VASC=6  . History of mitral valve replacement   . Hypertension   . Hypothyroidism   . Osteoporosis    on Boniva  . Permanent atrial fibrillation ([place])   . Pulmonary hypertension, moderate to severe ([place]) 12/01/2015  . Stroke [facility])    on Clopidrigel  . UTI (lower urinary tract infection) 07/2015   n&v; AKI  . Vascular dementia     Past Surgical History:  Procedure Laterality Date  . ABDOMINAL HYSTERECTOMY    . FRACTURE SURGERY  [id number]   Operative repair with intramedullary nail intratrochanteric Left Hip fracture  . INTRAMEDULLARY (IM) NAIL INTERTROCHANTERIC Left 12/01/2015   Procedure: INTRAMEDULLARY (IM) NAIL INTERTROCHANTRIC;  Surgeon: Carole Civil, MD;  Location: [facility];  Service: Orthopedics;  Laterality: Left;  . MITRAL VALVE REPLACEMENT      There were no vitals filed for this visit.      Subjective Assessment - 04/02/16 1441    Subjective Son states she has not been at the [facility] the past couple of days.  States balance and  flexibility remain her major deficits.   Currently in Pain? No/denies                         [facility] Adult PT Treatment/Exercise - 04/02/16 0001      Knee/Hip Exercises: Stretches   Active Hamstring Stretch Both;3 reps;30 seconds   Active Hamstring Stretch Limitations 12" box   Gastroc Stretch Both;3 reps;30 seconds   Gastroc Stretch Limitations slant board     Knee/Hip Exercises: Standing   Stairs 2 RT 7" and 4" steps with 1 HR reciprocally     Knee/Hip Exercises: Seated   Sit to Sand 2 sets;15 reps;without UE support             Balance Exercises - 04/02/16 1504      Balance Exercises: Standing   Retro Gait 2 reps  on baalnce beam   Sidestepping 2 reps  on balance beam   Step Over Hurdles / Cones 1RT forward 6" and 12"             PT Short Term Goals - 03/14/16 1435      PT SHORT TERM GOAL #1   Title Pt to be able to single leg stance for 5-10 seconds on both legs to allow pt to feel confident ambulating with a cane indoors    Baseline 02/28/2016:  Rt 3", Lt 2" max of  3   Time 3   Period Weeks   Status On-going     PT SHORT TERM GOAL #2   Title Pt strength in her left leg to be increased 1/2 grade to allow pt to be able to lift her leg easily in and out of the bed/car.   Time 3   Period Weeks   Status Achieved     PT SHORT TERM GOAL #3   Title Pt to be able to come from sit to stand from her couch with ease.   Time 3   Period Weeks   Status Achieved     PT SHORT TERM GOAL #4   Title Pt pain in her Lt hip to be no greater than a 3/10 to allow pt to be able to walk for 15 minutes without difficulty with a cane    Baseline 02/28/2016: No reports of pain in a month   Time 3   Period Weeks   Status Achieved           PT Long Term Goals - 03/14/16 1435      PT LONG TERM GOAL #1   Title Pt to be able to single leg stance for 15- 20 seconds on both legs  to allow pt to feel confident walking with no assistive device on flat  terrain    Time 6   Period Weeks   Status Not Met     PT LONG TERM GOAL #2   Title Pt strength in her left LE to be increased by one grade to allow pt to go up and down 10 steps in a reciprocal manner to get into her bedroom    Time 6   Period Weeks   Status On-going     PT LONG TERM GOAL #3   Title Pt pain to be at a 1/10 or less in her left hip to allow her to walk for 40 minutes at a time without stopping to get back to walking for exercise for improved health benefits.    Period Weeks   Status Achieved               Plan - 04/02/16 1518    Clinical Impression Statement continued focus on dynamic balance activities.   Added hurdles on balance beam with varied height (6"/12") with min-mod assistance.  O2 sats monitored during session and stayed within 92-99% with activity and rest.  Pt required 4 short seated rest breaks during session.  much improved balance with side steppping on balance beam today.    Rehab Potential Good   PT Frequency 2x / week   PT Duration 6 weeks   PT Treatment/Interventions ADLs/Self Care Home Management;Gait training;Stair training;Functional mobility training;Therapeutic activities;Therapeutic exercise;Balance training;Neuromuscular re-education;Patient/family education;Manual techniques   PT Next Visit Plan Continue to focus on functional gluteal strengthening, and improved dynamic SLS activities.  Monitor SPO2 levels during session.   Consulted and Agree with Plan of Care Patient;Family member/caregiver   Family Member Consulted son       Patient will benefit from skilled therapeutic intervention in order to improve the following deficits and impairments:  Abnormal gait, Decreased balance, Decreased activity tolerance, Decreased strength, Postural dysfunction, Pain, Difficulty walking  Visit Diagnosis: Pain in left hip  History of falling  Unsteadiness on feet  Muscle weakness (generalized)  Difficulty in walking, not elsewhere  classified     Problem List Patient Active Problem List   Diagnosis Date Noted  . Cough 12/16/2015  .  Osteoporosis 12/05/2015  . Biatrial enlargement 12/01/2015  . Pulmonary hypertension, moderate to severe ([place]) 12/01/2015  . Closed fracture of intertrochanteric section of femur ([place])   . Chronic atrial fibrillation ([place]) 11/30/2015  . Hypothyroidism 11/29/2015  . Thrombocytopenia ([place]) 07/13/2015  . Chronic anticoagulation 07/13/2015  . H/O mitral valve replacement 07/13/2015  . Vascular dementia 07/12/2015  . Essential hypertension 07/12/2015    Teena Irani, PTA/CLT [phone] 04/02/2016, 3:21 PM  [facility] [address] [place], [place], [address] Phone: [phone]   Fax:  [phone]  Name: Courtney [name] MRN: [id number] Date of Birth: July 28, 1929

## 2016-04-04 ENCOUNTER — Ambulatory Visit (HOSPITAL_COMMUNITY): Payer: Medicare Other

## 2016-04-04 DIAGNOSIS — M25552 Pain in left hip: Secondary | ICD-10-CM | POA: Diagnosis not present

## 2016-04-04 DIAGNOSIS — M6281 Muscle weakness (generalized): Secondary | ICD-10-CM

## 2016-04-04 DIAGNOSIS — R262 Difficulty in walking, not elsewhere classified: Secondary | ICD-10-CM

## 2016-04-04 DIAGNOSIS — Z9181 History of falling: Secondary | ICD-10-CM

## 2016-04-04 DIAGNOSIS — R2681 Unsteadiness on feet: Secondary | ICD-10-CM

## 2016-04-04 NOTE — Patient Instructions (Signed)
Ideas for future balance training with Son:  1. Practice walking forward and backward Progress by increasing step length or walking speed 2. Practice walking sideways Progress by increasing step length or walking speed 3. Red light Green light: Abrupt starts and stops  Progress by increasing step length or walking speed 4. Abrupt directional changes during walking.  5. Practice head-turns while walking

## 2016-04-04 NOTE — Therapy (Addendum)
PHYSICAL THERAPY DISCHARGE SUMMARY  Visits from Start of Care: 18  Current functional level related to goals / functional outcomes: *see below   Remaining deficits: *see below   Education / Equipment: *see below  Plan: Patient agrees to discharge.  Patient goals were partially met. Patient is being discharged due to being pleased with the current functional level.  ?????         [place], [place], [address] Phone: [phone]   Fax:  [phone]   3:47 PM, 04/04/16 Etta Grandchild, PT, DPT Physical Therapist at [facility] [phone] (office)      Physical Therapy Treatment  Patient Details  Name: Courtney [name] MRN: [id number] Date of Birth: 1929/03/18 Referring Provider: Garrison Columbus  Encounter Date: 04/04/2016      PT End of Session - 04/04/16 1523    Visit Number 18   Number of Visits 19   Authorization - Visit Number 18   Authorization - Number of Visits 21   PT Start Time 5625   PT Stop Time 1520   PT Time Calculation (min) 46 min   Equipment Utilized During Treatment Gait belt   Activity Tolerance Patient tolerated treatment well;No increased pain   Behavior During Therapy WFL for tasks assessed/performed      Past Medical History:  Diagnosis Date  . Biatrial enlargement 12/01/2015  . Chronic anticoagulation    coumadin, CHADS2VASC=6  . History of mitral valve replacement   . Hypertension   . Hypothyroidism   . Osteoporosis    on Boniva  . Permanent atrial fibrillation ([place])   . Pulmonary hypertension, moderate to severe ([place]) 12/01/2015  . Stroke [facility])    on Clopidrigel  . UTI (lower urinary tract infection) 07/2015   n&v; AKI  . Vascular dementia     Past Surgical History:  Procedure Laterality Date  . ABDOMINAL HYSTERECTOMY    . FRACTURE SURGERY  [id number]   Operative repair with intramedullary nail intratrochanteric Left Hip fracture  .  INTRAMEDULLARY (IM) NAIL INTERTROCHANTERIC Left 12/01/2015   Procedure: INTRAMEDULLARY (IM) NAIL INTERTROCHANTRIC;  Surgeon: Carole Civil, MD;  Location: [facility];  Service: Orthopedics;  Laterality: Left;  . MITRAL VALVE REPLACEMENT      There were no vitals filed for this visit.      Subjective Assessment - 04/04/16 1501    Subjective Pt is feeling good, optimistic about her progress, and she is glad to haev made improvements overall. Pt's memory precludes much additional detail. Son reports that she overall seems much stronger and has not complained of pain in close to 2-3 weeks. He says they make gorceries together each week and she tolerates walking around the store for 25-30 minutes each visit. He also reports that she has been walking on the treatmill for up to 15 minutes at a time.    Pertinent History L THA April 2017; Post hip precautions; some memory deficts from remote CVA.    How long can you sit comfortably? No problem    How long can you walk comfortably? 25-30 minutes with a walker (improved)    Patient Stated Goals Goals were to walk with a cane, met; go up and down steps (doing but one at a time), and to be going to the gym which she is doing    Currently in Pain? No/denies   Pain Score 0-No pain  [facility] PT Assessment - 04/04/16 0001      Assessment   Medical Diagnosis Lt hip ORIF   Referring Provider Garrison Columbus   Onset Date/Surgical Date 12/01/15   Prior Therapy SNF until 01/23/2016     Precautions   Precautions None     Restrictions   Weight Bearing Restrictions No     [place] residence   Home Access Level entry   Home Layout Two level   Alternate Level Stairs-Number of Steps 14     Prior Function   Level of [place] Retired   Leisure walking for exercises; goes to fitness center      Cognition   Overall Cognitive Status Impaired/Different from baseline  slight  dementia      Functional Tests   Functional tests Single leg stance;Sit to Stand     Single Leg Stance   Comments unable on 6/22;  2" B on 7/26; unble 8/3; unable 8/31     Sit to Stand   Comments 16.7s on 6/22; 12.65s on 7/26; 15.50s on 8/3; 12.8s 8/31     Strength   Right Hip Flexion 5/5  was 4/5    Right Hip Extension 4-/5  2/5 on 7/26   Right Hip ABduction 4/5  was 3+/5    Left Hip Flexion 5/5  4/5 on 7/26   Left Hip Extension 4/5  2/5 on 7/26   Left Hip ABduction 4/5   Left Hip ADduction --  3-/5 on 7/26   Right Knee Flexion 5/5  4/5 on 7/26   Right Knee Extension 5/5  5/5 on 7/26   Left Knee Flexion 5/5  4-/5 on 7/26   Left Knee Extension 5/5  5/5 on 7/26                          Balance Exercises - 04/04/16 1524      Balance Exercises: Standing   Balance Beam tandem forward, sidestepping, hurdles 6/12 alternating 2RT each   Retro Gait 1 rep  , gait + head turns, abrupt stop/start, abrupt direction cha             PT Short Term Goals - 04/04/16 1537      PT SHORT TERM GOAL #1   Title Pt to be able to single leg stance for 5-10 seconds on both legs to allow pt to feel confident ambulating with a cane indoors    Baseline 02/28/2016:  Rt 3", Lt 2" max of 3   Time 3   Period Weeks   Status Not Met     PT SHORT TERM GOAL #2   Title Pt strength in her left leg to be increased 1/2 grade to allow pt to be able to lift her leg easily in and out of the bed/car.   Time 3   Period Weeks   Status Achieved     PT SHORT TERM GOAL #3   Title Pt to be able to come from sit to stand from her couch with ease.   Baseline 02/28/2016: 5 STS 12.65"   Time 3   Period Weeks   Status Achieved     PT SHORT TERM GOAL #4   Title Pt pain in her Lt hip to be no greater than a 3/10 to allow pt to be able to walk for 15 minutes without difficulty with a cane    Baseline 02/28/2016: No reports  of pain in a month   Time 3   Period Weeks   Status  Achieved           PT Long Term Goals - April 11, 2016 1538      PT LONG TERM GOAL #1   Title Pt to be able to single leg stance for 15- 20 seconds on both legs  to allow pt to feel confident walking with no assistive device on flat terrain    Time 6   Period Weeks   Status Not Met     PT LONG TERM GOAL #2   Title Pt strength in her left LE to be increased by one grade to allow pt to go up and down 10 steps in a reciprocal manner to get into her bedroom    Time 6   Period Weeks   Status Achieved     PT LONG TERM GOAL #3   Title Pt pain to be at a 1/10 or less in her left hip to allow her to walk for 40 minutes at a time without stopping to get back to walking for exercise for improved health benefits.    Baseline Pt has been able to perform 25-30 minute grocery trps with son with some minimal fatigue that limits further.     Time 6   Period Weeks   Status Partially Met               Plan - 04-11-2016 1526    Clinical Impression Statement Reassessment performed today, the patient is now ready for discharge. Pt demonstrating continued improvement in isolated strength testing, but with minimal translation to functional movmementt like SLS. 5x STS remains stable since last assessment, with a time that is less than the age matches health norm population value, but with mild LOB during performance. Postural limitations are somewhat improved, but patient continues to present with bent knee and flexed hip posture during stance/gait. Funcitonal balalnce is improved during gait (now with lesser AD, and with greater confidence), but mostly liited by overall energy availablilty/activity tolerance per son. Pt's c/o pain has completely resolved. The patient is now participating in regularly schedule exercise at [facility] with assistance of son, and more advanced cues for balalnce training are reviewed in full.    Rehab Potential Good   PT Treatment/Interventions ADLs/Self Care Home Management;Gait  training;Stair training;Functional mobility training;Therapeutic activities;Therapeutic exercise;Balance training;Neuromuscular re-education;Patient/family education;Manual techniques   PT Home Exercise Plan Updated advanced balance training with patient/son.    Consulted and Agree with Plan of Care Patient;Family member/caregiver   Family Member Consulted son       Patient will benefit from skilled therapeutic intervention in order to improve the following deficits and impairments:  Abnormal gait, Decreased balance, Decreased activity tolerance, Decreased strength, Postural dysfunction, Pain, Difficulty walking  Visit Diagnosis: Pain in left hip  History of falling  Unsteadiness on feet  Muscle weakness (generalized)  Difficulty in walking, not elsewhere classified       G-Codes - 04-11-2016 1538    Functional Assessment Tool Used Clinical Judgment    Functional Limitation Mobility: Walking and moving around   Mobility: Walking and Moving Around Goal Status [phone]) At least 20 percent but less than 40 percent impaired, limited or restricted   Mobility: Walking and Moving Around Discharge Status [phone]) At least 1 percent but less than 20 percent impaired, limited or restricted      Problem List Patient Active Problem List   Diagnosis Date Noted  . Cough  12/16/2015  . Osteoporosis 12/05/2015  . Biatrial enlargement 12/01/2015  . Pulmonary hypertension, moderate to severe ([place]) 12/01/2015  . Closed fracture of intertrochanteric section of femur ([place])   . Chronic atrial fibrillation ([place]) 11/30/2015  . Hypothyroidism 11/29/2015  . Thrombocytopenia ([place]) 07/13/2015  . Chronic anticoagulation 07/13/2015  . H/O mitral valve replacement 07/13/2015  . Vascular dementia 07/12/2015  . Essential hypertension 07/12/2015    3:41 PM, 04/04/16 Etta Grandchild, PT, DPT Physical Therapist at [facility] [phone] (office)     [facility] [address] [place], [place], [address] Phone: [phone]   Fax:  [phone]  Name: Quinisha Mould MRN: [id number] Date of Birth: 1929/05/02

## 2016-05-28 ENCOUNTER — Encounter: Payer: Self-pay | Admitting: Orthopedic Surgery

## 2016-05-28 ENCOUNTER — Ambulatory Visit (INDEPENDENT_AMBULATORY_CARE_PROVIDER_SITE_OTHER): Payer: Medicare Other | Admitting: Orthopedic Surgery

## 2016-05-28 DIAGNOSIS — S72142D Displaced intertrochanteric fracture of left femur, subsequent encounter for closed fracture with routine healing: Secondary | ICD-10-CM

## 2016-05-28 NOTE — Progress Notes (Signed)
Patient ID: Courtney [name], female   DOB: 1929/05/26, 82 y.o.   MRN: [id number]  Chief Complaint  Patient presents with  . Follow-up    left hip IM Nail, 12/01/15    HPI Courtney [name] is a 82 y.o. female.   HPI  6 Months after inotrope nail left hip. Gamma nail. Routine follow-up.  Review of Systems Review of Systems  She has no complaints of pain swelling Physical Exam There were no vitals taken for this visit.   Physical Exam  She is ambulatory with a cane she has some mild short and long-term memory loss she is oriented to person and place mood and affect are pleasant and flat overall parents was normal grooming and hygiene her leg lengths were equal she demonstrated good cane use weightbearing as tolerated hip flexion equal in both hips   Encounter Diagnosis  Name Primary?  . Intertrochanteric fracture of left hip, closed, with routine healing, subsequent encounter Yes

## 2017-02-01 ENCOUNTER — Emergency Department (HOSPITAL_COMMUNITY): Payer: Medicare Other

## 2017-02-01 ENCOUNTER — Encounter (HOSPITAL_COMMUNITY): Payer: Self-pay | Admitting: Emergency Medicine

## 2017-02-01 ENCOUNTER — Emergency Department (HOSPITAL_COMMUNITY)
Admission: EM | Admit: 2017-02-01 | Discharge: 2017-02-01 | Disposition: A | Discharge status: Home / Self Care | Payer: Medicare Other | Attending: Emergency Medicine | Admitting: Emergency Medicine

## 2017-02-01 DIAGNOSIS — E039 Hypothyroidism, unspecified: Secondary | ICD-10-CM | POA: Insufficient documentation

## 2017-02-01 DIAGNOSIS — W19XXXA Unspecified fall, initial encounter: Secondary | ICD-10-CM | POA: Insufficient documentation

## 2017-02-01 DIAGNOSIS — I10 Essential (primary) hypertension: Secondary | ICD-10-CM | POA: Diagnosis not present

## 2017-02-01 DIAGNOSIS — S42255A Nondisplaced fracture of greater tuberosity of left humerus, initial encounter for closed fracture: Secondary | ICD-10-CM | POA: Diagnosis not present

## 2017-02-01 DIAGNOSIS — S4292XA Fracture of left shoulder girdle, part unspecified, initial encounter for closed fracture: Secondary | ICD-10-CM

## 2017-02-01 DIAGNOSIS — S42215A Unspecified nondisplaced fracture of surgical neck of left humerus, initial encounter for closed fracture: Secondary | ICD-10-CM | POA: Insufficient documentation

## 2017-02-01 DIAGNOSIS — S4992XA Unspecified injury of left shoulder and upper arm, initial encounter: Secondary | ICD-10-CM | POA: Diagnosis present

## 2017-02-01 DIAGNOSIS — Z79899 Other long term (current) drug therapy: Secondary | ICD-10-CM | POA: Diagnosis not present

## 2017-02-01 DIAGNOSIS — Y999 Unspecified external cause status: Secondary | ICD-10-CM | POA: Insufficient documentation

## 2017-02-01 DIAGNOSIS — Y939 Activity, unspecified: Secondary | ICD-10-CM | POA: Insufficient documentation

## 2017-02-01 DIAGNOSIS — Y929 Unspecified place or not applicable: Secondary | ICD-10-CM | POA: Insufficient documentation

## 2017-02-01 DIAGNOSIS — Z7901 Long term (current) use of anticoagulants: Secondary | ICD-10-CM | POA: Diagnosis not present

## 2017-02-01 MED ORDER — HYDROCODONE-ACETAMINOPHEN 5-325 MG PO TABS: 1.0000 | ORAL_TABLET | Freq: Four times a day (QID) | ORAL | 0 refills | Status: DC | PRN

## 2017-02-01 MED ORDER — TRAMADOL HCL 50 MG PO TABS: 50.0000 mg | ORAL_TABLET | Freq: Four times a day (QID) | ORAL | 0 refills | Status: DC | PRN

## 2017-02-01 NOTE — ED Provider Notes (Signed)
[facility] DEPT Provider Note   CSN: [id number] Arrival date & time: 02/01/17  1701     History   Chief Complaint Chief Complaint  Patient presents with  . Fall    HPI Courtney [name] is a 83 y.o. female.  Patient fell and hit her left shoulder. She did not hit her head   The history is provided by the patient. No language interpreter was used.  Fall  This is a new problem. The current episode started 3 to 5 hours ago. The problem occurs rarely. The problem has been resolved. Pertinent negatives include no chest pain, no abdominal pain and no headaches. Nothing aggravates the symptoms. Nothing relieves the symptoms.    Past Medical History:  Diagnosis Date  . Biatrial enlargement 12/01/2015  . Chronic anticoagulation    coumadin, CHADS2VASC=6  . History of mitral valve replacement   . Hypertension   . Hypothyroidism   . Osteoporosis    on Boniva  . Permanent atrial fibrillation ([place])   . Pulmonary hypertension, moderate to severe ([place]) 12/01/2015  . Stroke [facility])    on Clopidrigel  . UTI (lower urinary tract infection) 07/2015   n&v; AKI  . Vascular dementia     Patient Active Problem List   Diagnosis Date Noted  . Cough 12/16/2015  . Osteoporosis 12/05/2015  . Biatrial enlargement 12/01/2015  . Pulmonary hypertension, moderate to severe ([place]) 12/01/2015  . Closed fracture of intertrochanteric section of femur ([place])   . Chronic atrial fibrillation ([place]) 11/30/2015  . Hypothyroidism 11/29/2015  . Thrombocytopenia ([place]) 07/13/2015  . Chronic anticoagulation 07/13/2015  . H/O mitral valve replacement 07/13/2015  . Vascular dementia 07/12/2015  . Essential hypertension 07/12/2015    Past Surgical History:  Procedure Laterality Date  . ABDOMINAL HYSTERECTOMY    . FRACTURE SURGERY  [id number]   Operative repair with intramedullary nail intratrochanteric Left Hip fracture  . INTRAMEDULLARY (IM) NAIL INTERTROCHANTERIC Left 12/01/2015   Procedure: INTRAMEDULLARY (IM)  NAIL INTERTROCHANTRIC;  Surgeon: Carole Civil, MD;  Location: [facility];  Service: Orthopedics;  Laterality: Left;  . MITRAL VALVE REPLACEMENT      OB History    No data available       Home Medications    Prior to Admission medications   Medication Sig Start Date End Date Taking? Authorizing Provider  acetaminophen (TYLENOL) 500 MG tablet Take 500 mg by mouth every 6 (six) hours as needed for mild pain.   Yes [provider]  amLODipine (NORVASC) 2.5 MG tablet Take 2.5 mg by mouth daily.   Yes [provider]  atenolol (TENORMIN) 25 MG tablet Take 12.5 mg by mouth daily.    Yes [provider]  b complex vitamins tablet Take 1 tablet by mouth daily.   Yes [provider]  clopidogrel (PLAVIX) 75 MG tablet Take 1 tablet (75 mg total) by mouth daily. Start 12/05/15 12/04/15  Yes Rexene Alberts, MD  ibandronate (BONIVA) 150 MG tablet Take 150 mg by mouth every 30 (thirty) days. Take in the morning with a full glass of water, on an empty stomach, and do not take anything else by mouth or lie down for the next 30 min.   Yes [provider]  Levothyroxine Sodium 125 MCG CAPS Take 125 mcg by mouth daily.  12/05/15  Yes Hendricks Limes, MD  lisinopril (PRINIVIL,ZESTRIL) 2.5 MG tablet Take 2.5 mg by mouth daily.   Yes [provider]  triamterene-hydrochlorothiazide (DYAZIDE) 37.5-25 MG capsule Take 1 capsule  by mouth every other day.    Yes [provider]  vitamin C (ASCORBIC ACID) 250 MG tablet Take 250 mg by mouth daily.    Yes [provider]  VITAMIN D, ERGOCALCIFEROL, PO Take 250 mg by mouth daily.   Yes [provider]  warfarin (COUMADIN) 4 MG tablet Take 4 mg by mouth daily. Take 4mg  by mouth on mon, wed, fri, sun. Take 2mg  n tues, thurs, and sat.   Yes [provider]  traMADol (ULTRAM) 50 MG tablet Take 1 tablet (50 mg total) by mouth every 6 (six) hours as needed. 02/01/17   Milton Ferguson, MD    warfarin (COUMADIN) 2 MG tablet Take 2 mg  by mouth on Tues,Thu,Sat at 5:00 pm. Take 4 mg by mouth on Sun,Mon,Fri at 5:00 pm    [provider]    Family History Family History  Problem Relation Age of Onset  . Heart disease Mother   . Heart disease Father   . Stroke Neg Hx   . Cancer Neg Hx   . Diabetes Neg Hx     Social History Social History  Substance Use Topics  . Smoking status: Never Smoker  . Smokeless tobacco: Never Used  . Alcohol use No     Allergies   Penicillins   Review of Systems Review of Systems  Constitutional: Negative for appetite change and fatigue.  HENT: Negative for congestion, ear discharge and sinus pressure.   Eyes: Negative for discharge.  Respiratory: Negative for cough.   Cardiovascular: Negative for chest pain.  Gastrointestinal: Negative for abdominal pain and diarrhea.  Genitourinary: Negative for frequency and hematuria.  Musculoskeletal: Negative for back pain.       Shoulder pain  Skin: Negative for rash.  Neurological: Negative for seizures and headaches.  Psychiatric/Behavioral: Negative for hallucinations.     Physical Exam Updated Vital Signs BP (!) 146/85 (BP Location: Right Arm)   Pulse 66   Temp 98.2 F (36.8 C) (Oral)   Resp 17   Ht 5\' 3"  (1.6 m)   Wt 61.2 kg (135 lb)   SpO2 97%   BMI 23.91 kg/m   Physical Exam  Constitutional: She is oriented to person, place, and time. She appears well-developed.  HENT:  Head: Normocephalic.  Eyes: Conjunctivae and EOM are normal. No scleral icterus.  Neck: Neck supple. No thyromegaly present.  Cardiovascular: Normal rate and regular rhythm.  Exam reveals no gallop and no friction rub.   No murmur heard. Pulmonary/Chest: No stridor. She has no wheezes. She has no rales. She exhibits no tenderness.  Abdominal: She exhibits no distension. There is no tenderness. There is no rebound.  Musculoskeletal: She exhibits no edema.  Tender left shoulder with decreased  range of motion  Lymphadenopathy:    She has no cervical adenopathy.  Neurological: She is oriented to person, place, and time. She exhibits normal muscle tone. Coordination normal.  Skin: No rash noted. No erythema.  Psychiatric: She has a normal mood and affect. Her behavior is normal.     ED Treatments / Results  Labs (all labs ordered are listed, but only abnormal results are displayed) Labs Reviewed - No data to display  EKG  EKG Interpretation None       Radiology Dg Shoulder Left  Result Date: 02/01/2017 CLINICAL DATA:  Unwitnessed fall, LEFT shoulder pain EXAM: LEFT SHOULDER - 2+ VIEW COMPARISON:  None FINDINGS: Diffuse osseous demineralization. AC joint alignment normal. Inferior acromial spur formation. Nondisplaced fracture  at surgical neck LEFT humerus with additional nondisplaced fracture plane through the greater tuberosity. No dislocation. Deformity of the distal scapula consistent with an age-indeterminate fracture ; a prior chest radiograph from 2017 shows questionable deformity of the distal scapula. Visualized LEFT ribs intact. Atherosclerotic calcification aorta. IMPRESSION: Acute nondisplaced fractures involving the surgical neck of the LEFT humerus and the greater tuberosity LEFT humerus. Age-indeterminate fracture of the distal scapula, with questionable scapular deformity on a remote chest radiograph; correlation for pain/tenderness at the distal LEFT scapula recommended. Electronically Signed   By: Lavonia Dana M.D.   On: 02/01/2017 17:52    Procedures Procedures (including critical care time)  Medications Ordered in ED Medications - No data to display   Initial Impression / Assessment and Plan / ED Course  I have reviewed the triage vital signs and the nursing notes.  Pertinent labs & imaging results that were available during my care of the patient were reviewed by me and considered in my medical decision making (see chart for details).     Patient  has a fractured left shoulder. She will be given a sling some Ultram for pain and will follow-up with her orthopedic surgeon Dr. Aline Brochure  Final Clinical Impressions(s) / ED Diagnoses   Final diagnoses:  Shoulder fracture, left, closed, initial encounter    New Prescriptions New Prescriptions   TRAMADOL (ULTRAM) 50 MG TABLET    Take 1 tablet (50 mg total) by mouth every 6 (six) hours as needed.     Milton Ferguson, MD 02/01/17 [phone]

## 2017-02-01 NOTE — Discharge Instructions (Signed)
Follow up with dr. Harrison next week. °

## 2017-02-01 NOTE — ED Triage Notes (Signed)
Unwitnessed fall. Pt c/o lt shoulder pain.  No obvious deformity.  Pt given 4 mg morphine iv en route to ED.

## 2017-02-04 ENCOUNTER — Telehealth: Payer: Self-pay | Admitting: Orthopaedic Surgery

## 2017-02-04 ENCOUNTER — Telehealth: Payer: Self-pay | Admitting: Orthopedic Surgery

## 2017-02-04 NOTE — Telephone Encounter (Signed)
Patient fell on 02/01/17 went to the [facility] ER. She has a L Shoulder fracture, she was given a sling, some Ultram for pain and needs to follow-up with you. Her son is asking for Friday morning, but your schedule is loaded already.  Please advise

## 2017-02-04 NOTE — Telephone Encounter (Signed)
SORRY CANT SEE ANYBODY ELSE FRIDAY

## 2017-02-04 NOTE — Telephone Encounter (Signed)
Called the patient's son [name]) to let him know that Dr.Harrison's schedule is loaded and Dr. Aline Brochure sent me a reply stating he could not see anymore on Friday. I asked [name] if his mom would like to see Dr. Luna Glasgow next week since Dr. Aline Brochure will not be in the office. [name] stated that would be fine, he didn't know what could be done. His mom is walking around without the sling saying it doesn't hurt that much. He also stated that he thought she just needed it checked and maybe be sent for some PT. I told him that she did need to be checked and the sling she should wear until she is checked out.

## 2017-02-12 ENCOUNTER — Ambulatory Visit (INDEPENDENT_AMBULATORY_CARE_PROVIDER_SITE_OTHER): Payer: Medicare Other

## 2017-02-12 ENCOUNTER — Ambulatory Visit (INDEPENDENT_AMBULATORY_CARE_PROVIDER_SITE_OTHER): Payer: Medicare Other | Admitting: Orthopaedic Surgery

## 2017-02-12 ENCOUNTER — Encounter: Payer: Self-pay | Admitting: Orthopaedic Surgery

## 2017-02-12 VITALS — BP 106/63 | HR 69 | Temp 97.8°F | Ht 63.0 in | Wt 128.0 lb

## 2017-02-12 DIAGNOSIS — S42225A 2-part nondisplaced fracture of surgical neck of left humerus, initial encounter for closed fracture: Secondary | ICD-10-CM

## 2017-02-12 NOTE — Progress Notes (Signed)
Subjective:    Patient ID: Courtney [name], female    DOB: 19-Nov-1928, 83 y.o.   MRN: [id number]  HPI She fell on 02-01-17 at home and hurt her left shoulder.  She was seen in ER.  X-rays show fracture of proximal humerus on the left nondisplaced.  She lives with her son.  She had brain hemorrhage several years ago and has some dementia.  He is the caregiver.  She had no other injury with this fall.  She is using a sling. She is on Coumadin.   Review of Systems  HENT: Negative for congestion.   Respiratory: Negative for cough and shortness of breath.   Cardiovascular: Positive for palpitations. Negative for chest pain and leg swelling.  Endocrine: Positive for cold intolerance.  Musculoskeletal: Positive for arthralgias, gait problem and joint swelling.  Allergic/Immunologic: Positive for environmental allergies.   Past Medical History:  Diagnosis Date  . Biatrial enlargement 12/01/2015  . Chronic anticoagulation    coumadin, CHADS2VASC=6  . History of mitral valve replacement   . Hypertension   . Hypothyroidism   . Osteoporosis    on Boniva  . Permanent atrial fibrillation ([place])   . Pulmonary hypertension, moderate to severe ([place]) 12/01/2015  . Stroke [facility])    on Clopidrigel  . UTI (lower urinary tract infection) 07/2015   n&v; AKI  . Vascular dementia     Past Surgical History:  Procedure Laterality Date  . ABDOMINAL HYSTERECTOMY    . FRACTURE SURGERY  [id number]   Operative repair with intramedullary nail intratrochanteric Left Hip fracture  . INTRAMEDULLARY (IM) NAIL INTERTROCHANTERIC Left 12/01/2015   Procedure: INTRAMEDULLARY (IM) NAIL INTERTROCHANTRIC;  Surgeon: Carole Civil, MD;  Location: [facility];  Service: Orthopedics;  Laterality: Left;  . MITRAL VALVE REPLACEMENT      Current Outpatient Prescriptions on File Prior to Visit  Medication Sig Dispense Refill  . acetaminophen (TYLENOL) 500 MG tablet Take 500 mg by mouth every 6 (six) hours as needed for mild pain.      Marland Kitchen amLODipine (NORVASC) 2.5 MG tablet Take 2.5 mg by mouth daily.    Marland Kitchen atenolol (TENORMIN) 25 MG tablet Take 12.5 mg by mouth daily.     Marland Kitchen b complex vitamins tablet Take 1 tablet by mouth daily.    . clopidogrel (PLAVIX) 75 MG tablet Take 1 tablet (75 mg total) by mouth daily. Start 12/05/15    . HYDROcodone-acetaminophen (NORCO/VICODIN) 5-325 MG tablet Take 1 tablet by mouth every 6 (six) hours as needed. 20 tablet 0  . ibandronate (BONIVA) 150 MG tablet Take 150 mg by mouth every 30 (thirty) days. Take in the morning with a full glass of water, on an empty stomach, and do not take anything else by mouth or lie down for the next 30 min.    . Levothyroxine Sodium 125 MCG CAPS Take 125 mcg by mouth daily.  90 tablet 3  . lisinopril (PRINIVIL,ZESTRIL) 2.5 MG tablet Take 2.5 mg by mouth daily.    . traMADol (ULTRAM) 50 MG tablet Take 1 tablet (50 mg total) by mouth every 6 (six) hours as needed. 20 tablet 0  . triamterene-hydrochlorothiazide (DYAZIDE) 37.5-25 MG capsule Take 1 capsule by mouth every other day.     . vitamin C (ASCORBIC ACID) 250 MG tablet Take 250 mg by mouth daily.     Marland Kitchen VITAMIN D, ERGOCALCIFEROL, PO Take 250 mg by mouth daily.    Marland Kitchen warfarin (COUMADIN) 2 MG tablet Take 2 mg  by  mouth on Tues,Thu,Sat at 5:00 pm. Take 4 mg by mouth on Sun,Mon,Fri at 5:00 pm    . warfarin (COUMADIN) 4 MG tablet Take 4 mg by mouth daily. Take 4mg  by mouth on mon, wed, fri, sun. Take 2mg  n tues, thurs, and sat.     No current facility-administered medications on file prior to visit.     Social History   Social History  . Marital status: Divorced    Spouse name: N/A  . Number of children: N/A  . Years of education: N/A   Occupational History  . Retired    Social History Main Topics  . Smoking status: Never Smoker  . Smokeless tobacco: Never Used  . Alcohol use No  . Drug use: No  . Sexual activity: No   Other Topics Concern  . Not on file   Social History Narrative   Lives with her  ex-husband and son when in [place]. Has a house in [place], [place].    Family History  Problem Relation Age of Onset  . Heart disease Mother   . Heart disease Father   . Stroke Neg Hx   . Cancer Neg Hx   . Diabetes Neg Hx     BP 106/63   Pulse 69   Temp 97.8 F (36.6 C)   Ht 5\' 3"  (1.6 m)   Wt 128 lb (58.1 kg)   BMI 22.67 kg/m      Objective:   Physical Exam  Constitutional: She is oriented to person, place, and time. She appears well-developed and well-nourished.  HENT:  Head: Normocephalic and atraumatic.  Eyes: Conjunctivae and EOM are normal. Pupils are equal, round, and reactive to light.  Neck: Normal range of motion. Neck supple.  Cardiovascular: Normal rate, regular rhythm and intact distal pulses.   Pulmonary/Chest: Effort normal.  Abdominal: Soft.  Musculoskeletal: She exhibits tenderness (left shoulder tender, decreased motion, in sling.  She has significant ecchymosis of left elbow area and distal upper arm.  neck negative.  Right shoulder negative.).  Neurological: She is alert and oriented to person, place, and time. She displays normal reflexes. No cranial nerve deficit. She exhibits normal muscle tone. Coordination normal.  Skin: Skin is warm and dry.  Psychiatric: She has a normal mood and affect. Her behavior is normal. Judgment and thought content normal.  Vitals reviewed.   X-rays were done of the left shoulder, reported separately.      Assessment & Plan:   Encounter Diagnosis  Name Primary?  . Closed 2-part nondisplaced fracture of surgical neck of left humerus, initial encounter Yes   Continue sling and present management.  Return in two weeks.  X-rays on return.  Call if any problem.  Precautions discussed.   Electronically Signed Sanjuana Kava, MD 7/11/20183:22 PM

## 2017-02-25 ENCOUNTER — Encounter: Payer: Self-pay | Admitting: Orthopaedic Surgery

## 2017-02-25 ENCOUNTER — Ambulatory Visit (INDEPENDENT_AMBULATORY_CARE_PROVIDER_SITE_OTHER): Payer: Self-pay | Admitting: Orthopaedic Surgery

## 2017-02-25 ENCOUNTER — Ambulatory Visit (INDEPENDENT_AMBULATORY_CARE_PROVIDER_SITE_OTHER): Payer: Medicare Other

## 2017-02-25 DIAGNOSIS — S42225D 2-part nondisplaced fracture of surgical neck of left humerus, subsequent encounter for fracture with routine healing: Secondary | ICD-10-CM | POA: Diagnosis not present

## 2017-02-25 NOTE — Progress Notes (Signed)
CC:  My shoulder does not hurt  Her left shoulder is not hurting her.  She is out of her sling.  She is more active.  She has no new trauma.  NV intact to the left shoulder.  Motion is improved.  X-rays were done, reported separately.  Encounter Diagnosis  Name Primary?  . Closed 2-part nondisplaced fracture of surgical neck of left humerus with routine healing, subsequent encounter Yes   I will have her go to OT for the shoulder.  Call if any problem.  Precautions discussed.   X-ray on return.  Electronically Signed Sanjuana Kava, MD 7/24/20183:01 PM

## 2017-02-26 ENCOUNTER — Ambulatory Visit: Payer: Medicare Other | Admitting: Orthopaedic Surgery

## 2017-03-10 ENCOUNTER — Encounter (HOSPITAL_COMMUNITY): Payer: Self-pay

## 2017-03-10 ENCOUNTER — Ambulatory Visit (HOSPITAL_COMMUNITY): Payer: Medicare Other | Attending: Orthopaedic Surgery

## 2017-03-10 DIAGNOSIS — R29898 Other symptoms and signs involving the musculoskeletal system: Secondary | ICD-10-CM | POA: Diagnosis present

## 2017-03-10 DIAGNOSIS — M25612 Stiffness of left shoulder, not elsewhere classified: Secondary | ICD-10-CM | POA: Diagnosis present

## 2017-03-10 DIAGNOSIS — M79622 Pain in left upper arm: Secondary | ICD-10-CM | POA: Diagnosis not present

## 2017-03-10 NOTE — Patient Instructions (Signed)
SHOULDER: Flexion On Table   Place hands on table, elbows straight. Move hips away from body. Press hands down into table. Hold ___ seconds. ___ reps per set, ___ sets per day, ___ days per week  Abduction (Passive)   With arm out to side, resting on table, lower head toward arm, keeping trunk away from table. Hold ____ seconds. Repeat ____ times. Do ____ sessions per day.  Copyright  VHI. All rights reserved.     Internal Rotation (Assistive)   Seated with elbow bent at right angle and held against side, slide arm on table surface in an inward arc. Repeat ____ times. Do ____ sessions per day. Activity: Use this motion to brush crumbs off the table.  Copyright  VHI. All rights reserved.   

## 2017-03-10 NOTE — Therapy (Signed)
[place], [place], [address] Phone: [phone]   Fax:  [phone]  Occupational Therapy Evaluation  Patient Details  Name: Courtney [name] MRN: [id number] Date of Birth: 03-02-1929 Referring Provider: Sanjuana Kava, MD  Encounter Date: 03/10/2017      OT End of Session - 03/10/17 1501    Visit Number 1   Number of Visits 8   Date for OT Re-Evaluation 04/23/17  Pt will be gone immediately on vacation for 2 weeks starting this week   Authorization Type Medicare - copay $20   Authorization Time Period before 10th visit   Authorization - Visit Number 1   Authorization - Number of Visits 10   OT Start Time 1346   OT Stop Time 1425   OT Time Calculation (min) 39 min   Activity Tolerance Patient tolerated treatment well   Behavior During Therapy [facility] for tasks assessed/performed      Past Medical History:  Diagnosis Date  . Biatrial enlargement 12/01/2015  . Chronic anticoagulation    coumadin, CHADS2VASC=6  . History of mitral valve replacement   . Hypertension   . Hypothyroidism   . Osteoporosis    on Boniva  . Permanent atrial fibrillation ([place])   . Pulmonary hypertension, moderate to severe ([place]) 12/01/2015  . Stroke [facility])    on Clopidrigel  . UTI (lower urinary tract infection) 07/2015   n&v; AKI  . Vascular dementia     Past Surgical History:  Procedure Laterality Date  . ABDOMINAL HYSTERECTOMY    . FRACTURE SURGERY  [id number]   Operative repair with intramedullary nail intratrochanteric Left Hip fracture  . INTRAMEDULLARY (IM) NAIL INTERTROCHANTERIC Left 12/01/2015   Procedure: INTRAMEDULLARY (IM) NAIL INTERTROCHANTRIC;  Surgeon: Carole Civil, MD;  Location: [facility];  Service: Orthopedics;  Laterality: Left;  . MITRAL VALVE REPLACEMENT      There were no vitals filed for this visit.      Subjective Assessment - 03/10/17 1354    Subjective  S: I just really want to go back to doing the things  I was doing.   Patient is accompained by: Family member  son   Pertinent History Pt is a 83 year old female who began having left upper pain around 3 months ago due to a fall. Pt presents today with a fractured left humerus. Dr. Luna Glasgow has referred pt to occupational therapy for evaluation and treatment.   Special Tests FOTO: 61   Patient Stated Goals Pt's son states that he wants his mother to return to her full level of functioning with her left arm   Currently in Pain? No/denies   Multiple Pain Sites No           [facility] OT Assessment - 03/10/17 1354      Assessment   Diagnosis Left humerus fx   Referring Provider Sanjuana Kava, MD   Onset Date 02/01/17   Assessment Follow up 04/03/17   Prior Therapy None     Precautions   Precautions None     Restrictions   Weight Bearing Restrictions No     Balance Screen   Has the patient fallen in the past 6 months Yes   How many times? 2   Has the patient had a decrease in activity level because of a fear of falling?  No  Son reports the answer is yes   Is the patient reluctant to leave their home because of a fear of falling?  No  Does not leave the house without son     Home  Environment   Family/patient expects to be discharged to: Private residence   Lives With [place] (Comment)  Ex-husband     Prior Function   Level of Independence Independent with basic ADLs   Vocation Retired   Leisure Card games, moving around the house     ADL   ADL comments Pt reports having problems with all ADL/IADL activities.  Son reports having an aide come 2 hours a day.     Mobility   Mobility Status History of falls     Written Expression   Dominant Hand Right     Vision - History   Baseline Vision Bifocals     Cognition   Overall Cognitive Status History of cognitive impairments - at baseline     ROM / Strength   AROM / PROM / Strength AROM;PROM;Strength     Palpation   Palpation comment Pt presentd with min fascial  restrictions along the left upper arm, shoulder and scapularis regions.     AROM   Overall AROM  Deficits   Overall AROM Comments Assessed seated. IR/er adducted.   AROM Assessment Site Shoulder   Right/Left Shoulder Left   Left Shoulder Flexion 92 Degrees   Left Shoulder ABduction 81 Degrees   Left Shoulder Internal Rotation 90 Degrees   Left Shoulder External Rotation 46 Degrees     PROM   Overall PROM  Deficits   Overall PROM Comments Assessed supine. IR/er adducted.   PROM Assessment Site Shoulder   Right/Left Shoulder Left   Left Shoulder Flexion 113 Degrees   Left Shoulder ABduction 105 Degrees   Left Shoulder Internal Rotation 90 Degrees   Left Shoulder External Rotation 58 Degrees     Strength   Overall Strength Deficits   Overall Strength Comments Assessed seated. IR/er adducted.   Strength Assessment Site Shoulder   Right/Left Shoulder Left   Left Shoulder Flexion 3-/5   Left Shoulder ABduction 3-/5   Left Shoulder Internal Rotation 3-/5   Left Shoulder External Rotation 3-/5             OT Education - 03/10/17 1458    Education provided Yes   Education Details Educated pt and son on need for therapy; provided handout for table slides   Person(s) Educated Patient;Child(ren)  son   Methods Explanation;Demonstration;Verbal cues;Handout   Comprehension Verbalized understanding;Returned demonstration          OT Short Term Goals - 03/10/17 1539      OT SHORT TERM GOAL #1   Title Pt will understand and be independent with the HEP provided to facilitate her progress with using LUE in daily activities.   Time 4   Period Weeks   Status New   Target Date 04/07/17     OT SHORT TERM GOAL #2   Title Patient will return to highest level of independence with all daily and leisure tasks using her LUE.   Time 4   Period Weeks   Status New     OT SHORT TERM GOAL #3   Title Pt will have decreased fascial restrictions to trace amount in her left upper arm  region to relieve tightness and tenderness to facilitate pain-free movements with ADLs.   Time 4   Period Weeks   Status New     OT SHORT TERM GOAL #4   Title Patient will increase A/ROM to [facility] to increase ability to complete dressing tasks with  increased comfort level.   Time 4   Period Weeks   Status New     OT SHORT TERM GOAL #5   Title Patient will increase LUE strength to 4/5 to increase ability to get in and out of bed with more comfort.   Time 4   Period Weeks   Status New                  Plan - 03/10/17 1534    Clinical Impression Statement A: Pt is a 83 year old female presenting with a left humerus fracture causing increased pain and fascial restrictions as well as decreased ROM and strength resulting in difficulty completing her daily activities using LUE.    Occupational Profile and client history currently impacting functional performance Motivated to return to PLOF; supportive family   Occupational performance deficits (Please refer to evaluation for details): ADL's;IADL's;Rest and Sleep;Leisure   Rehab Potential Good   Current Impairments/barriers affecting progress: Pt has dementia; short term memory loss   OT Frequency 2x / week   OT Duration 4 weeks   OT Treatment/Interventions Therapeutic exercise;Patient/family education;Self-care/ADL training;Ultrasound;Manual Therapy;Iontophoresis;Cryotherapy;DME and/or AE instruction;Therapeutic activities;Electrical Stimulation;Moist Heat;Passive range of motion   Plan P: Pt will benefit from skilled OT services to increase functional performance during ADL tasks while using LUE.  Treatment plan: myofascial release, manual stretching, P/ROM, AA/ROM, A/ROM, general strengthening and modalities.   Clinical Decision Making Limited treatment options, no task modification necessary   OT Home Exercise Plan 03/10/17: Table slides   Consulted and Agree with Plan of Care Patient;Family member/caregiver   Family Member Consulted  Son      Patient will benefit from skilled therapeutic intervention in order to improve the following deficits and impairments:  Decreased strength, Impaired flexibility, Decreased mobility, Decreased range of motion, Pain, Increased fascial restricitons, Impaired UE functional use  Visit Diagnosis: Pain in left upper arm - Plan: Ot plan of care cert/re-cert  Other symptoms and signs involving the musculoskeletal system - Plan: Ot plan of care cert/re-cert  Stiffness of left shoulder, not elsewhere classified - Plan: Ot plan of care cert/re-cert      G-Codes - 66/06/30 1550    Functional Assessment Tool Used (Outpatient only) FOTO score: 53/100 (47% impaired)   Functional Limitation Carrying, moving and handling objects   Carrying, Moving and Handling Objects Current Status (Z6010) At least 40 percent but less than 60 percent impaired, limited or restricted   Carrying, Moving and Handling Objects Goal Status (X3235) At least 20 percent but less than 40 percent impaired, limited or restricted      Problem List Patient Active Problem List   Diagnosis Date Noted  . Cough 12/16/2015  . Osteoporosis 12/05/2015  . Biatrial enlargement 12/01/2015  . Pulmonary hypertension, moderate to severe ([place]) 12/01/2015  . Closed fracture of intertrochanteric section of femur ([place])   . Chronic atrial fibrillation ([place]) 11/30/2015  . Hypothyroidism 11/29/2015  . Thrombocytopenia ([place]) 07/13/2015  . Chronic anticoagulation 07/13/2015  . H/O mitral valve replacement 07/13/2015  . Vascular dementia 07/12/2015  . Essential hypertension 07/12/2015    Luther Hearing, OT Student [phone] 03/10/2017, 3:52 PM  [facility] [address] [place], [place], [address] Phone: [phone]   Fax:  [phone]  Name: Courtney [name] MRN: [id number] Date of Birth: 05-23-1929      This qualified practitioner was present in the room guiding the student in service  delivery. Therapy student was participating in the provision of services, and  the practitioner was not engaged in treating another patient or doing other tasks at the same time.  Ailene Ravel, OTR/L,CBIS  [phone]

## 2017-03-25 ENCOUNTER — Ambulatory Visit (HOSPITAL_COMMUNITY): Payer: Medicare Other | Admitting: Occupational Therapy

## 2017-03-25 ENCOUNTER — Encounter (HOSPITAL_COMMUNITY): Payer: Self-pay | Admitting: Occupational Therapy

## 2017-03-25 DIAGNOSIS — M79622 Pain in left upper arm: Secondary | ICD-10-CM

## 2017-03-25 DIAGNOSIS — M25612 Stiffness of left shoulder, not elsewhere classified: Secondary | ICD-10-CM

## 2017-03-25 DIAGNOSIS — R29898 Other symptoms and signs involving the musculoskeletal system: Secondary | ICD-10-CM

## 2017-03-25 NOTE — Therapy (Signed)
[place], [place], [address] Phone: [phone]   Fax:  [phone]  Occupational Therapy Treatment  Patient Details  Name: Courtney [name] MRN: [id number] Date of Birth: 1929-04-24 Referring Provider: Sanjuana Kava, MD  Encounter Date: 03/25/2017      OT End of Session - 03/25/17 1455    Visit Number 2   Number of Visits 8   Date for OT Re-Evaluation 04/23/17    Authorization Type Medicare - copay $20   Authorization Time Period before 10th visit   Authorization - Visit Number 2   Authorization - Number of Visits 10   OT Start Time 3875   OT Stop Time 1432   OT Time Calculation (min) 43 min   Activity Tolerance Patient tolerated treatment well   Behavior During Therapy [facility] for tasks assessed/performed      Past Medical History:  Diagnosis Date  . Biatrial enlargement 12/01/2015  . Chronic anticoagulation    coumadin, CHADS2VASC=6  . History of mitral valve replacement   . Hypertension   . Hypothyroidism   . Osteoporosis    on Boniva  . Permanent atrial fibrillation ([place])   . Pulmonary hypertension, moderate to severe ([place]) 12/01/2015  . Stroke [facility])    on Clopidrigel  . UTI (lower urinary tract infection) 07/2015   n&v; AKI  . Vascular dementia     Past Surgical History:  Procedure Laterality Date  . ABDOMINAL HYSTERECTOMY    . FRACTURE SURGERY  [id number]   Operative repair with intramedullary nail intratrochanteric Left Hip fracture  . INTRAMEDULLARY (IM) NAIL INTERTROCHANTERIC Left 12/01/2015   Procedure: INTRAMEDULLARY (IM) NAIL INTERTROCHANTRIC;  Surgeon: Carole Civil, MD;  Location: [facility];  Service: Orthopedics;  Laterality: Left;  . MITRAL VALVE REPLACEMENT      There were no vitals filed for this visit.      Subjective Assessment - 03/25/17 1350    Subjective  S: I just can't lift this arm very high.    Currently in Pain? No/denies            [facility] OT Assessment - 03/25/17  1350      Assessment   Diagnosis Left humerus fx     Precautions   Precautions None                  OT Treatments/Exercises (OP) - 03/25/17 1354      Exercises   Exercises Shoulder     Shoulder Exercises: Supine   Protraction PROM;5 reps;AAROM;10 reps   Horizontal ABduction PROM;5 reps;AAROM;10 reps   External Rotation PROM;5 reps;AAROM;10 reps   Internal Rotation PROM;5 reps;AAROM;10 reps   Flexion PROM;5 reps;AAROM;10 reps   ABduction PROM;5 reps;AAROM;10 reps     Shoulder Exercises: Seated   Elevation AROM;10 reps   Row AROM;10 reps     Shoulder Exercises: Pulleys   Flexion 1 minute     Shoulder Exercises: Therapy Ball   Flexion 10 reps     Shoulder Exercises: ROM/Strengthening   Wall Wash 1'     Manual Therapy   Manual Therapy Myofascial release   Manual therapy comments completed separately from therapeutic exercise   Myofascial Release Myofascial release to left upper arm, trapezius, and scapularis regions to decrease pain and fascial restrictions and increase range of motion                OT Education - 03/25/17 1455    Education provided Yes   Education Details  provided evaluation and reviewed goals    Person(s) Educated Patient;Child(ren)  son   Methods Explanation;Handout   Comprehension Verbalized understanding          OT Short Term Goals - 03/25/17 1458      OT SHORT TERM GOAL #1   Title Pt will understand and be independent with the HEP provided to facilitate her progress with using LUE in daily activities.   Time 4   Period Weeks   Status On-going     OT SHORT TERM GOAL #2   Title Patient will return to highest level of independence with all daily and leisure tasks using her LUE.   Time 4   Period Weeks   Status On-going     OT SHORT TERM GOAL #3   Title Pt will have decreased fascial restrictions to trace amount in her left upper arm region to relieve tightness and tenderness to facilitate pain-free movements  with ADLs.   Time 4   Period Weeks   Status On-going     OT SHORT TERM GOAL #4   Title Patient will increase A/ROM to [facility] to increase ability to complete dressing tasks with increased comfort level.   Time 4   Period Weeks   Status On-going     OT SHORT TERM GOAL #5   Title Patient will increase LUE strength to 4/5 to increase ability to get in and out of bed with more comfort.   Time 4   Period Weeks   Status On-going                  Plan - 03/25/17 1455    Clinical Impression Statement A: Initiated myofascial release and manual therapy this session, as well as AA/ROM, pulleys, and therapy ball. Pt able to achieve ROM WFL, verbal cuing for form and technique. Occasional tactile cuing during exercises to maintain form. Pt and son report HEP is going well.    Plan P: Provide AA/ROM HEP, add thumb tacks, scapular extension   OT Home Exercise Plan 03/10/17: Table slides   Consulted and Agree with Plan of Care Patient;Family member/caregiver   Family Member Consulted Son      Patient will benefit from skilled therapeutic intervention in order to improve the following deficits and impairments:  Decreased strength, Impaired flexibility, Decreased mobility, Decreased range of motion, Pain, Increased fascial restricitons, Impaired UE functional use  Visit Diagnosis: Pain in left upper arm  Other symptoms and signs involving the musculoskeletal system  Stiffness of left shoulder, not elsewhere classified    Problem List Patient Active Problem List   Diagnosis Date Noted  . Cough 12/16/2015  . Osteoporosis 12/05/2015  . Biatrial enlargement 12/01/2015  . Pulmonary hypertension, moderate to severe ([place]) 12/01/2015  . Closed fracture of intertrochanteric section of femur ([place])   . Chronic atrial fibrillation ([place]) 11/30/2015  . Hypothyroidism 11/29/2015  . Thrombocytopenia ([place]) 07/13/2015  . Chronic anticoagulation 07/13/2015  . H/O mitral valve replacement 07/13/2015   . Vascular dementia 07/12/2015  . Essential hypertension 07/12/2015   Guadelupe Sabin, OTR/L  [phone] 03/25/2017, 2:58 PM  [place] [address] [place], [place], [address] Phone: [phone]   Fax:  [phone]  Name: Isley Weisheit MRN: [id number] Date of Birth: 07/20/1929

## 2017-03-26 ENCOUNTER — Ambulatory Visit (INDEPENDENT_AMBULATORY_CARE_PROVIDER_SITE_OTHER): Payer: Medicare Other | Admitting: Orthopaedic Surgery

## 2017-03-26 ENCOUNTER — Ambulatory Visit (INDEPENDENT_AMBULATORY_CARE_PROVIDER_SITE_OTHER): Payer: Medicare Other

## 2017-03-26 DIAGNOSIS — S42225D 2-part nondisplaced fracture of surgical neck of left humerus, subsequent encounter for fracture with routine healing: Secondary | ICD-10-CM

## 2017-03-26 NOTE — Progress Notes (Signed)
CC:  My shoulder is sore today  She had an OT appointment yesterday and her left shoulder is sore today.  She has no new trauma, no weakness.  NV intact. ROM is painful.  X-rays were done, reported separately.  The fracture is healing well.  Encounter Diagnosis  Name Primary?  . Closed 2-part nondisplaced fracture of surgical neck of left humerus with routine healing, subsequent encounter Yes   Return in one month.  Continue OT.  Call if any problem.  Precautions discussed.    X-rays on return.  Electronically Signed Sanjuana Kava, MD 8/22/20182:51 PM

## 2017-03-27 ENCOUNTER — Encounter (HOSPITAL_COMMUNITY): Payer: Self-pay | Admitting: Occupational Therapy

## 2017-03-27 ENCOUNTER — Ambulatory Visit (HOSPITAL_COMMUNITY): Payer: Medicare Other | Admitting: Occupational Therapy

## 2017-03-27 DIAGNOSIS — R29898 Other symptoms and signs involving the musculoskeletal system: Secondary | ICD-10-CM

## 2017-03-27 DIAGNOSIS — M25612 Stiffness of left shoulder, not elsewhere classified: Secondary | ICD-10-CM

## 2017-03-27 DIAGNOSIS — M79622 Pain in left upper arm: Secondary | ICD-10-CM

## 2017-03-27 NOTE — Patient Instructions (Signed)

## 2017-03-27 NOTE — Therapy (Signed)
Courtney [name], [place], [address] Phone: [phone]   Fax:  [phone]  Occupational Therapy Treatment  Patient Details  Name: Courtney [name] MRN: [id number] Date of Birth: 07-May-1929 Referring Provider: Sanjuana Kava, MD  Encounter Date: 03/27/2017      OT End of Session - 03/27/17 1518    Visit Number 3   Number of Visits 8   Date for OT Re-Evaluation 04/23/17   Authorization Type Medicare - copay $20   Authorization Time Period before 10th visit   Authorization - Visit Number 3   Authorization - Number of Visits 10   OT Start Time 1437  pt arrived late   OT Stop Time 1515   OT Time Calculation (min) 38 min   Activity Tolerance Patient tolerated treatment well   Behavior During Therapy [facility] for tasks assessed/performed      Past Medical History:  Diagnosis Date  . Biatrial enlargement 12/01/2015  . Chronic anticoagulation    coumadin, CHADS2VASC=6  . History of mitral valve replacement   . Hypertension   . Hypothyroidism   . Osteoporosis    on Boniva  . Permanent atrial fibrillation ([place])   . Pulmonary hypertension, moderate to severe ([place]) 12/01/2015  . Stroke [facility])    on Clopidrigel  . UTI (lower urinary tract infection) 07/2015   n&v; AKI  . Vascular dementia     Past Surgical History:  Procedure Laterality Date  . ABDOMINAL HYSTERECTOMY    . FRACTURE SURGERY  [id number]   Operative repair with intramedullary nail intratrochanteric Left Hip fracture  . INTRAMEDULLARY (IM) NAIL INTERTROCHANTERIC Left 12/01/2015   Procedure: INTRAMEDULLARY (IM) NAIL INTERTROCHANTRIC;  Surgeon: Carole Civil, MD;  Location: [facility];  Service: Orthopedics;  Laterality: Left;  . MITRAL VALVE REPLACEMENT      There were no vitals filed for this visit.      Subjective Assessment - 03/27/17 1437    Subjective  S: Dr. Luna Glasgow said it was looking good.    Patient is accompained by: Family member  son   Currently in  Pain? No/denies            [facility] OT Assessment - 03/27/17 1437      Assessment   Diagnosis Left humerus fx     Precautions   Precautions None                  OT Treatments/Exercises (OP) - 03/27/17 1454      Exercises   Exercises Shoulder     Shoulder Exercises: Supine   Protraction PROM;5 reps;AAROM;10 reps   Horizontal ABduction PROM;5 reps;AAROM;10 reps   External Rotation PROM;5 reps;AAROM;10 reps   Internal Rotation PROM;5 reps;AAROM;10 reps   Flexion PROM;5 reps;AAROM;10 reps   ABduction PROM;5 reps;AAROM;10 reps     Shoulder Exercises: Seated   Elevation AROM;10 reps   Extension AROM;10 reps   Row AROM;10 reps     Shoulder Exercises: ROM/Strengthening   Wall Wash 1'   Thumb Tacks 1'     Manual Therapy   Manual Therapy Myofascial release   Manual therapy comments completed separately from therapeutic exercise   Myofascial Release Myofascial release to left upper arm, trapezius, and scapularis regions to decrease pain and fascial restrictions and increase range of motion                OT Education - 03/27/17 1457    Education provided Yes   Education Details AA/ROM exercises  Person(s) Educated Patient;Child(ren)  son   Methods Explanation;Demonstration;Handout   Comprehension Verbalized understanding;Returned demonstration          OT Short Term Goals - 03/25/17 1458      OT SHORT TERM GOAL #1   Title Pt will understand and be independent with the HEP provided to facilitate her progress with using LUE in daily activities.   Time 4   Period Weeks   Status On-going     OT SHORT TERM GOAL #2   Title Patient will return to highest level of independence with all daily and leisure tasks using her LUE.   Time 4   Period Weeks   Status On-going     OT SHORT TERM GOAL #3   Title Pt will have decreased fascial restrictions to trace amount in her left upper arm region to relieve tightness and tenderness to facilitate pain-free  movements with ADLs.   Time 4   Period Weeks   Status On-going     OT SHORT TERM GOAL #4   Title Patient will increase A/ROM to [facility] to increase ability to complete dressing tasks with increased comfort level.   Time 4   Period Weeks   Status On-going     OT SHORT TERM GOAL #5   Title Patient will increase LUE strength to 4/5 to increase ability to get in and out of bed with more comfort.   Time 4   Period Weeks   Status On-going                  Plan - 03/27/17 1519    Clinical Impression Statement A: Continued with AA/ROM this session, added thumb tacks and A/ROM extension. Pt son reports MD is pleased with her healing and progress thus far. Pt experienced slight soreness yesterday after Tuesday's therapy session. Pt required verbal and tactile cuing for form and technique today. Provided HEP for AA/ROM.    Plan P: Follow up on AA/ROM HEP. Add AA/ROM in sitting   OT Home Exercise Plan 03/10/17: Table slides; 8/23: AA/ROM exercises   Consulted and Agree with Plan of Care Patient;Family member/caregiver   Family Member Consulted Son      Patient will benefit from skilled therapeutic intervention in order to improve the following deficits and impairments:  Decreased strength, Impaired flexibility, Decreased mobility, Decreased range of motion, Pain, Increased fascial restricitons, Impaired UE functional use  Visit Diagnosis: Pain in left upper arm  Other symptoms and signs involving the musculoskeletal system  Stiffness of left shoulder, not elsewhere classified    Problem List Patient Active Problem List   Diagnosis Date Noted  . Cough 12/16/2015  . Osteoporosis 12/05/2015  . Biatrial enlargement 12/01/2015  . Pulmonary hypertension, moderate to severe ([place]) 12/01/2015  . Closed fracture of intertrochanteric section of femur ([place])   . Chronic atrial fibrillation ([place]) 11/30/2015  . Hypothyroidism 11/29/2015  . Thrombocytopenia ([place]) 07/13/2015  . Chronic  anticoagulation 07/13/2015  . H/O mitral valve replacement 07/13/2015  . Vascular dementia 07/12/2015  . Essential hypertension 07/12/2015   Guadelupe Sabin, OTR/L  [phone] 03/27/2017, 3:21 PM  [place] [address] [place], [place], [address] Phone: [phone]   Fax:  [phone]  Name: Abigael Mogle MRN: [id number] Date of Birth: 08/10/1928

## 2017-04-01 ENCOUNTER — Encounter (HOSPITAL_COMMUNITY): Payer: Self-pay

## 2017-04-01 ENCOUNTER — Ambulatory Visit (HOSPITAL_COMMUNITY): Payer: Medicare Other

## 2017-04-01 DIAGNOSIS — M79622 Pain in left upper arm: Secondary | ICD-10-CM

## 2017-04-01 DIAGNOSIS — M25612 Stiffness of left shoulder, not elsewhere classified: Secondary | ICD-10-CM

## 2017-04-01 DIAGNOSIS — R29898 Other symptoms and signs involving the musculoskeletal system: Secondary | ICD-10-CM

## 2017-04-01 NOTE — Therapy (Signed)
Courtney [name], [place], [address] Phone: [phone]   Fax:  [phone]  Occupational Therapy Treatment  Patient Details  Name: Courtney [name] MRN: [id number] Date of Birth: 06/25/1929 Referring Provider: Sanjuana Kava, MD  Encounter Date: 04/01/2017      OT End of Session - 04/01/17 1530    Visit Number 4   Number of Visits 8   Date for OT Re-Evaluation 04/23/17   Authorization Type Medicare - copay $20   Authorization Time Period before 10th visit   Authorization - Visit Number 4   Authorization - Number of Visits 10   OT Start Time 1440  pt arrived late   OT Stop Time 1515   OT Time Calculation (min) 35 min   Activity Tolerance Patient tolerated treatment well   Behavior During Therapy [facility] for tasks assessed/performed      Past Medical History:  Diagnosis Date  . Biatrial enlargement 12/01/2015  . Chronic anticoagulation    coumadin, CHADS2VASC=6  . History of mitral valve replacement   . Hypertension   . Hypothyroidism   . Osteoporosis    on Boniva  . Permanent atrial fibrillation ([place])   . Pulmonary hypertension, moderate to severe ([place]) 12/01/2015  . Stroke [facility])    on Clopidrigel  . UTI (lower urinary tract infection) 07/2015   n&v; AKI  . Vascular dementia     Past Surgical History:  Procedure Laterality Date  . ABDOMINAL HYSTERECTOMY    . FRACTURE SURGERY  [id number]   Operative repair with intramedullary nail intratrochanteric Left Hip fracture  . INTRAMEDULLARY (IM) NAIL INTERTROCHANTERIC Left 12/01/2015   Procedure: INTRAMEDULLARY (IM) NAIL INTERTROCHANTRIC;  Surgeon: Carole Civil, MD;  Location: [facility];  Service: Orthopedics;  Laterality: Left;  . MITRAL VALVE REPLACEMENT      There were no vitals filed for this visit.      Subjective Assessment - 04/01/17 1459    Subjective  S: Sometimes I feel like I'm holding back because i'm afraid it will hurt.   Currently in Pain? No/denies             [facility] OT Assessment - 04/01/17 1500      Assessment   Diagnosis Left humerus fx     Precautions   Precautions None                  OT Treatments/Exercises (OP) - 04/01/17 1501      Exercises   Exercises Shoulder     Shoulder Exercises: Supine   Protraction PROM;5 reps;AAROM;10 reps   Horizontal ABduction PROM;5 reps;AAROM;10 reps   External Rotation PROM;5 reps;AAROM;10 reps   Internal Rotation PROM;5 reps;AAROM;10 reps   Flexion PROM;5 reps;AAROM;10 reps   ABduction PROM;5 reps     Shoulder Exercises: Seated   Protraction AAROM;10 reps   External Rotation AAROM;10 reps   Internal Rotation AAROM;10 reps   Flexion AAROM;10 reps     Manual Therapy   Manual Therapy Myofascial release   Manual therapy comments completed separately from therapeutic exercise                  OT Short Term Goals - 03/25/17 1458      OT SHORT TERM GOAL #1   Title Pt will understand and be independent with the HEP provided to facilitate her progress with using LUE in daily activities.   Time 4   Period Weeks   Status On-going     OT  SHORT TERM GOAL #2   Title Patient will return to highest level of independence with all daily and leisure tasks using her LUE.   Time 4   Period Weeks   Status On-going     OT SHORT TERM GOAL #3   Title Pt will have decreased fascial restrictions to trace amount in her left upper arm region to relieve tightness and tenderness to facilitate pain-free movements with ADLs.   Time 4   Period Weeks   Status On-going     OT SHORT TERM GOAL #4   Title Patient will increase A/ROM to [facility] to increase ability to complete dressing tasks with increased comfort level.   Time 4   Period Weeks   Status On-going     OT SHORT TERM GOAL #5   Title Patient will increase LUE strength to 4/5 to increase ability to get in and out of bed with more comfort.   Time 4   Period Weeks   Status On-going                  Plan  - 04/01/17 1530    Clinical Impression Statement A: Patient was unable to complete supine AA/ROM abduction due to pain. pain felt with all passive stretching. patient was able to tolerate passive stretching to approximately half range.    Plan P: Complete missed AA/ROM exercises seated. Attempt wall wash.      Patient will benefit from skilled therapeutic intervention in order to improve the following deficits and impairments:  Decreased strength, Impaired flexibility, Decreased mobility, Decreased range of motion, Pain, Increased fascial restricitons, Impaired UE functional use  Visit Diagnosis: Pain in left upper arm  Other symptoms and signs involving the musculoskeletal system  Stiffness of left shoulder, not elsewhere classified    Problem List Patient Active Problem List   Diagnosis Date Noted  . Cough 12/16/2015  . Osteoporosis 12/05/2015  . Biatrial enlargement 12/01/2015  . Pulmonary hypertension, moderate to severe ([place]) 12/01/2015  . Closed fracture of intertrochanteric section of femur ([place])   . Chronic atrial fibrillation ([place]) 11/30/2015  . Hypothyroidism 11/29/2015  . Thrombocytopenia ([place]) 07/13/2015  . Chronic anticoagulation 07/13/2015  . H/O mitral valve replacement 07/13/2015  . Vascular dementia 07/12/2015  . Essential hypertension 07/12/2015   Ailene Ravel, OTR/L,CBIS  [phone]  04/01/2017, 4:22 PM  [place] [address] [place], [place], [address] Phone: [phone]   Fax:  [phone]  Name: Courtney [name] MRN: [id number] Date of Birth: 1929/05/30

## 2017-04-03 ENCOUNTER — Encounter (HOSPITAL_COMMUNITY): Payer: Self-pay

## 2017-04-03 ENCOUNTER — Ambulatory Visit (HOSPITAL_COMMUNITY): Payer: Medicare Other

## 2017-04-03 DIAGNOSIS — M79622 Pain in left upper arm: Secondary | ICD-10-CM | POA: Diagnosis not present

## 2017-04-03 DIAGNOSIS — M25612 Stiffness of left shoulder, not elsewhere classified: Secondary | ICD-10-CM

## 2017-04-03 DIAGNOSIS — R29898 Other symptoms and signs involving the musculoskeletal system: Secondary | ICD-10-CM

## 2017-04-04 LAB — PROTIME-INR: INR: 1.5 — AB (ref 0.9–1.1)

## 2017-04-04 NOTE — Therapy (Signed)
[place], [place], [address] Phone: [phone]   Fax:  [phone]  Occupational Therapy Treatment  Patient Details  Name: Courtney [name] MRN: [id number] Date of Birth: [birth year] Referring Provider: Sanjuana Kava, MD  Encounter Date: 04/03/2017      OT End of Session - 04/03/17 1552    Visit Number 5   Number of Visits 8   Date for OT Re-Evaluation 04/23/17   Authorization Type Medicare - copay $20   Authorization Time Period before 10th visit   Authorization - Visit Number 5   Authorization - Number of Visits 10   OT Start Time 1435   OT Stop Time 1515   OT Time Calculation (min) 40 min   Activity Tolerance Patient tolerated treatment well   Behavior During Therapy [facility] for tasks assessed/performed      Past Medical History:  Diagnosis Date  . Biatrial enlargement 12/01/2015  . Chronic anticoagulation    coumadin, CHADS2VASC=6  . History of mitral valve replacement   . Hypertension   . Hypothyroidism   . Osteoporosis    on Boniva  . Permanent atrial fibrillation ([place])   . Pulmonary hypertension, moderate to severe ([place]) 12/01/2015  . Stroke [facility])    on Clopidrigel  . UTI (lower urinary tract infection) 07/2015   n&v; AKI  . Vascular dementia     Past Surgical History:  Procedure Laterality Date  . ABDOMINAL HYSTERECTOMY    . FRACTURE SURGERY  [id number]   Operative repair with intramedullary nail intratrochanteric Left Hip fracture  . INTRAMEDULLARY (IM) NAIL INTERTROCHANTERIC Left 12/01/2015   Procedure: INTRAMEDULLARY (IM) NAIL INTERTROCHANTRIC;  Surgeon: Carole Civil, MD;  Location: [facility];  Service: Orthopedics;  Laterality: Left;  . MITRAL VALVE REPLACEMENT      There were no vitals filed for this visit.      Subjective Assessment - 04/03/17 1502    Subjective  S: Son reports that his Mother went to the gym and worked for 45 minutes on the bike then completed lat pull downs. He is  really impressed by her ROM.    Currently in Pain? No/denies                      OT Treatments/Exercises (OP) - 04/03/17 1504      Exercises   Exercises Shoulder     Shoulder Exercises: Supine   Protraction PROM;5 reps;AROM;10 reps   Horizontal ABduction PROM;5 reps;AAROM;10 reps   External Rotation PROM;5 reps;AAROM;10 reps   Internal Rotation PROM;5 reps;AAROM;10 reps   Flexion PROM;5 reps;AAROM;10 reps   ABduction PROM;5 reps     Shoulder Exercises: Seated   Protraction AROM;10 reps   External Rotation AROM;10 reps   Internal Rotation AROM;10 reps   Flexion AAROM;10 reps     Shoulder Exercises: ROM/Strengthening   Wall Wash 1'   Proximal Shoulder Strengthening, Supine 10X no rest breaks     Manual Therapy   Manual Therapy Myofascial release   Manual therapy comments completed separately from therapeutic exercise   Myofascial Release Myofascial release to left upper arm, trapezius, and scapularis regions to decrease pain and fascial restrictions and increase range of motion                  OT Short Term Goals - 03/25/17 1458      OT SHORT TERM GOAL #1   Title Pt will understand and be independent with the HEP provided to  facilitate her progress with using LUE in daily activities.   Time 4   Period Weeks   Status On-going     OT SHORT TERM GOAL #2   Title Patient will return to highest level of independence with all daily and leisure tasks using her LUE.   Time 4   Period Weeks   Status On-going     OT SHORT TERM GOAL #3   Title Pt will have decreased fascial restrictions to trace amount in her left upper arm region to relieve tightness and tenderness to facilitate pain-free movements with ADLs.   Time 4   Period Weeks   Status On-going     OT SHORT TERM GOAL #4   Title Patient will increase A/ROM to [facility] to increase ability to complete dressing tasks with increased comfort level.   Time 4   Period Weeks   Status On-going     OT  SHORT TERM GOAL #5   Title Patient will increase LUE strength to 4/5 to increase ability to get in and out of bed with more comfort.   Time 4   Period Weeks   Status On-going                  Plan - 04/03/17 1553    Clinical Impression Statement A: Pt was slightly more sensitive to pain during passive stretching. Therapist was only able to stretch until pain was felt; approximately half range. Pt was able to complete some exercises as A/ROM this session. VC for form and technique were needed throughout.    Plan P: Add seated proximal shoulder strengthening. Continue with AA/ROM while incorporating A/ROM as able. Add pulleys.      Patient will benefit from skilled therapeutic intervention in order to improve the following deficits and impairments:  Decreased strength, Impaired flexibility, Decreased mobility, Decreased range of motion, Pain, Increased fascial restricitons, Impaired UE functional use  Visit Diagnosis: Pain in left upper arm  Other symptoms and signs involving the musculoskeletal system  Stiffness of left shoulder, not elsewhere classified    Problem List Patient Active Problem List   Diagnosis Date Noted  . Cough 12/16/2015  . Osteoporosis 12/05/2015  . Biatrial enlargement 12/01/2015  . Pulmonary hypertension, moderate to severe ([place]) 12/01/2015  . Closed fracture of intertrochanteric section of femur ([place])   . Chronic atrial fibrillation ([place]) 11/30/2015  . Hypothyroidism 11/29/2015  . Thrombocytopenia ([place]) 07/13/2015  . Chronic anticoagulation 07/13/2015  . H/O mitral valve replacement 07/13/2015  . Vascular dementia 07/12/2015  . Essential hypertension 07/12/2015   Ailene Ravel, OTR/L,CBIS  [phone]  04/04/2017, 7:58 AM  [place] [address] [place], [place], [address] Phone: [phone]   Fax:  [phone]  Name: Courtney [name] MRN: [id number] Date of Birth: [birth year]

## 2017-04-08 ENCOUNTER — Ambulatory Visit (HOSPITAL_COMMUNITY): Payer: Medicare Other | Attending: Orthopaedic Surgery | Admitting: Occupational Therapy

## 2017-04-08 ENCOUNTER — Encounter (HOSPITAL_COMMUNITY): Payer: Self-pay | Admitting: Occupational Therapy

## 2017-04-08 DIAGNOSIS — M25612 Stiffness of left shoulder, not elsewhere classified: Secondary | ICD-10-CM

## 2017-04-08 DIAGNOSIS — M79622 Pain in left upper arm: Secondary | ICD-10-CM

## 2017-04-08 DIAGNOSIS — R29898 Other symptoms and signs involving the musculoskeletal system: Secondary | ICD-10-CM | POA: Diagnosis present

## 2017-04-08 NOTE — Therapy (Signed)
[place], [place], [address] Phone: [phone]   Fax:  [phone]  Occupational Therapy Treatment  Patient Details  Name: Courtney [name] MRN: [id number] Date of Birth: [birth year] Referring Provider: Sanjuana Kava, MD  Encounter Date: 04/08/2017      OT End of Session - 04/08/17 1622    Visit Number 6   Number of Visits 8   Date for OT Re-Evaluation 04/23/17   Authorization Type Medicare - copay $20   Authorization Time Period before 10th visit   Authorization - Visit Number 6   Authorization - Number of Visits 10   OT Start Time 1435   OT Stop Time 1516   OT Time Calculation (min) 41 min   Activity Tolerance Patient tolerated treatment well   Behavior During Therapy [facility] for tasks assessed/performed      Past Medical History:  Diagnosis Date  . Biatrial enlargement 12/01/2015  . Chronic anticoagulation    coumadin, CHADS2VASC=6  . History of mitral valve replacement   . Hypertension   . Hypothyroidism   . Osteoporosis    on Boniva  . Permanent atrial fibrillation ([place])   . Pulmonary hypertension, moderate to severe ([place]) 12/01/2015  . Stroke [facility])    on Clopidrigel  . UTI (lower urinary tract infection) 07/2015   n&v; AKI  . Vascular dementia     Past Surgical History:  Procedure Laterality Date  . ABDOMINAL HYSTERECTOMY    . FRACTURE SURGERY  [id number]   Operative repair with intramedullary nail intratrochanteric Left Hip fracture  . INTRAMEDULLARY (IM) NAIL INTERTROCHANTERIC Left 12/01/2015   Procedure: INTRAMEDULLARY (IM) NAIL INTERTROCHANTRIC;  Surgeon: Carole Civil, MD;  Location: [facility];  Service: Orthopedics;  Laterality: Left;  . MITRAL VALVE REPLACEMENT      There were no vitals filed for this visit.      Subjective Assessment - 04/08/17 1436    Subjective  S: I have pain when I put my jacket on.    Currently in Pain? No/denies            [facility] OT Assessment - 04/08/17 1436       Assessment   Diagnosis Left humerus fx     Precautions   Precautions None                  OT Treatments/Exercises (OP) - 04/08/17 1440      Exercises   Exercises Shoulder     Shoulder Exercises: Supine   Protraction PROM;5 reps;AROM;10 reps   Horizontal ABduction PROM;5 reps;AAROM;10 reps   External Rotation PROM;5 reps;AAROM;10 reps   Internal Rotation PROM;5 reps;AAROM;10 reps   Flexion PROM;5 reps;AAROM;10 reps   ABduction PROM;5 reps     Shoulder Exercises: Seated   Protraction AAROM;10 reps   Horizontal ABduction AAROM;10 reps   External Rotation AROM;10 reps   Internal Rotation AROM;10 reps   Flexion AAROM;10 reps     Shoulder Exercises: Pulleys   Flexion 1 minute   ABduction 1 minute     Shoulder Exercises: ROM/Strengthening   Wall Wash 1'   Proximal Shoulder Strengthening, Supine 10X no rest breaks   Proximal Shoulder Strengthening, Seated 10X no rest breaks  OT unweighting LUE during circles     Manual Therapy   Manual Therapy Myofascial release   Manual therapy comments completed separately from therapeutic exercise   Myofascial Release Myofascial release to left upper arm, trapezius, and scapularis regions to decrease pain and fascial restrictions  and increase range of motion                  OT Short Term Goals - 03/25/17 1458      OT SHORT TERM GOAL #1   Title Pt will understand and be independent with the HEP provided to facilitate her progress with using LUE in daily activities.   Time 4   Period Weeks   Status On-going     OT SHORT TERM GOAL #2   Title Patient will return to highest level of independence with all daily and leisure tasks using her LUE.   Time 4   Period Weeks   Status On-going     OT SHORT TERM GOAL #3   Title Pt will have decreased fascial restrictions to trace amount in her left upper arm region to relieve tightness and tenderness to facilitate pain-free movements with ADLs.   Time 4   Period  Weeks   Status On-going     OT SHORT TERM GOAL #4   Title Patient will increase A/ROM to [facility] to increase ability to complete dressing tasks with increased comfort level.   Time 4   Period Weeks   Status On-going     OT SHORT TERM GOAL #5   Title Patient will increase LUE strength to 4/5 to increase ability to get in and out of bed with more comfort.   Time 4   Period Weeks   Status On-going                  Plan - 04/08/17 1622    Clinical Impression Statement A: Pt reports soreness today, went to the gym on Sunday and had slightly increased difficulty with pull downs per son. Pt unable to complete A/ROM with good form, therefore continued AA/ROM. Added proximal shoulder strengthening in sitting, OT providing visual and min tactile facilitation for technique. Verbal cuing for form and technique.    Plan P: continue with AA/ROM progressing to A/ROM when able to tolerate. Add scapular theraband if able to complete with good form   OT Home Exercise Plan 03/10/17: Table slides; 8/23: AA/ROM exercises   Consulted and Agree with Plan of Care Patient;Family member/caregiver   Family Member Consulted Son      Patient will benefit from skilled therapeutic intervention in order to improve the following deficits and impairments:  Decreased strength, Impaired flexibility, Decreased mobility, Decreased range of motion, Pain, Increased fascial restricitons, Impaired UE functional use  Visit Diagnosis: Pain in left upper arm  Other symptoms and signs involving the musculoskeletal system  Stiffness of left shoulder, not elsewhere classified    Problem List Patient Active Problem List   Diagnosis Date Noted  . Cough 12/16/2015  . Osteoporosis 12/05/2015  . Biatrial enlargement 12/01/2015  . Pulmonary hypertension, moderate to severe ([place]) 12/01/2015  . Closed fracture of intertrochanteric section of femur ([place])   . Chronic atrial fibrillation ([place]) 11/30/2015  . Hypothyroidism  11/29/2015  . Thrombocytopenia ([place]) 07/13/2015  . Chronic anticoagulation 07/13/2015  . H/O mitral valve replacement 07/13/2015  . Vascular dementia 07/12/2015  . Essential hypertension 07/12/2015   Guadelupe Sabin, OTR/L  [phone] 04/08/2017, 4:26 PM  [place] [address] [place], [place], [address] Phone: [phone]   Fax:  [phone]  Name: Peggyann Zwiefelhofer MRN: [id number] Date of Birth: [birth year]

## 2017-04-10 ENCOUNTER — Ambulatory Visit (HOSPITAL_COMMUNITY): Payer: Medicare Other | Admitting: Occupational Therapy

## 2017-04-11 ENCOUNTER — Telehealth (HOSPITAL_COMMUNITY): Payer: Self-pay

## 2017-04-11 NOTE — Telephone Encounter (Signed)
Pt's son has to return to the dentist on this date and can not bring her to this apptment.

## 2017-04-14 ENCOUNTER — Ambulatory Visit (HOSPITAL_COMMUNITY): Payer: Medicare Other

## 2017-04-16 ENCOUNTER — Encounter (HOSPITAL_COMMUNITY): Payer: Medicare Other

## 2017-04-17 ENCOUNTER — Ambulatory Visit (INDEPENDENT_AMBULATORY_CARE_PROVIDER_SITE_OTHER): Payer: Medicare Other | Admitting: Family Medicine

## 2017-04-17 ENCOUNTER — Encounter: Payer: Self-pay | Admitting: Family Medicine

## 2017-04-17 VITALS — BP 112/68 | HR 58 | Ht 63.0 in | Wt 127.0 lb

## 2017-04-17 DIAGNOSIS — Z23 Encounter for immunization: Secondary | ICD-10-CM

## 2017-04-17 DIAGNOSIS — I10 Essential (primary) hypertension: Secondary | ICD-10-CM | POA: Diagnosis not present

## 2017-04-17 DIAGNOSIS — F0150 Vascular dementia without behavioral disturbance: Secondary | ICD-10-CM

## 2017-04-17 DIAGNOSIS — E039 Hypothyroidism, unspecified: Secondary | ICD-10-CM

## 2017-04-17 DIAGNOSIS — Z952 Presence of prosthetic heart valve: Secondary | ICD-10-CM | POA: Diagnosis not present

## 2017-04-17 DIAGNOSIS — Z7901 Long term (current) use of anticoagulants: Secondary | ICD-10-CM | POA: Diagnosis not present
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
## 2017-04-18 ENCOUNTER — Encounter (HOSPITAL_COMMUNITY): Payer: Self-pay

## 2017-04-18 ENCOUNTER — Ambulatory Visit (HOSPITAL_COMMUNITY): Payer: Medicare Other

## 2017-04-18 DIAGNOSIS — R29898 Other symptoms and signs involving the musculoskeletal system: Secondary | ICD-10-CM

## 2017-04-18 DIAGNOSIS — M79622 Pain in left upper arm: Secondary | ICD-10-CM

## 2017-04-18 DIAGNOSIS — M25612 Stiffness of left shoulder, not elsewhere classified: Secondary | ICD-10-CM

## 2017-04-18 NOTE — Therapy (Signed)
[place], [place], [address] Phone: [phone]   Fax:  [phone]  Occupational Therapy Treatment  Patient Details  Name: Courtney [name] MRN: [id number] Date of Birth: 1928-10-28 Referring Provider: Sanjuana Kava, MD  Encounter Date: 04/18/2017      OT End of Session - 04/18/17 1504    Visit Number 7   Number of Visits 8   Date for OT Re-Evaluation 04/23/17   Authorization Type Medicare - copay $20   Authorization Time Period before 10th visit   Authorization - Visit Number 7   Authorization - Number of Visits 10   OT Start Time 1439  Pt arrived late   OT Stop Time 1515   OT Time Calculation (min) 36 min   Activity Tolerance Patient tolerated treatment well   Behavior During Therapy [facility] for tasks assessed/performed      Past Medical History:  Diagnosis Date  . Biatrial enlargement 12/01/2015  . Chronic anticoagulation    coumadin, CHADS2VASC=6  . History of mitral valve replacement   . Hypertension   . Hypothyroidism   . Osteoporosis    on Boniva  . Permanent atrial fibrillation ([place])   . Pulmonary hypertension, moderate to severe ([place]) 12/01/2015  . Stroke [facility])    on Clopidrigel  . UTI (lower urinary tract infection) 07/2015   n&v; AKI  . Vascular dementia     Past Surgical History:  Procedure Laterality Date  . ABDOMINAL HYSTERECTOMY    . FRACTURE SURGERY  [id number]   Operative repair with intramedullary nail intratrochanteric Left Hip fracture  . INTRAMEDULLARY (IM) NAIL INTERTROCHANTERIC Left 12/01/2015   Procedure: INTRAMEDULLARY (IM) NAIL INTERTROCHANTRIC;  Surgeon: Carole Civil, MD;  Location: [facility];  Service: Orthopedics;  Laterality: Left;  . MITRAL VALVE REPLACEMENT      There were no vitals filed for this visit.      Subjective Assessment - 04/18/17 1456    Subjective  S: It hurt when i was getting out of the car.   Currently in Pain? No/denies            [facility] OT  Assessment - 04/18/17 1456      Assessment   Diagnosis Left humerus fx     Precautions   Precautions None                  OT Treatments/Exercises (OP) - 04/18/17 1456      Exercises   Exercises Shoulder     Shoulder Exercises: Supine   Protraction PROM;5 reps;AROM;10 reps   Horizontal ABduction PROM;5 reps;AAROM;10 reps   External Rotation PROM;5 reps;AROM;10 reps   Internal Rotation PROM;5 reps;AROM;10 reps   Flexion PROM;5 reps;AROM;10 reps   ABduction PROM;5 reps     Shoulder Exercises: Seated   Protraction AROM;10 reps   Horizontal ABduction AAROM;10 reps   External Rotation AROM;10 reps   Internal Rotation AROM;10 reps   Flexion AROM;AAROM;5 reps;Limitations   Flexion Limitations 50% range. Complete 5 with cane and 5 without     Shoulder Exercises: Pulleys   Flexion 1 minute     Shoulder Exercises: ROM/Strengthening   Wall Wash 1'   Proximal Shoulder Strengthening, Supine 10X no rest breaks                  OT Short Term Goals - 03/25/17 1458      OT SHORT TERM GOAL #1   Title Pt will understand and be independent with the HEP  provided to facilitate her progress with using LUE in daily activities.   Time 4   Period Weeks   Status On-going     OT SHORT TERM GOAL #2   Title Patient will return to highest level of independence with all daily and leisure tasks using her LUE.   Time 4   Period Weeks   Status On-going     OT SHORT TERM GOAL #3   Title Pt will have decreased fascial restrictions to trace amount in her left upper arm region to relieve tightness and tenderness to facilitate pain-free movements with ADLs.   Time 4   Period Weeks   Status On-going     OT SHORT TERM GOAL #4   Title Patient will increase A/ROM to [facility] to increase ability to complete dressing tasks with increased comfort level.   Time 4   Period Weeks   Status On-going     OT SHORT TERM GOAL #5   Title Patient will increase LUE strength to 4/5 to increase  ability to get in and out of bed with more comfort.   Time 4   Period Weeks   Status On-going                  Plan - 04/18/17 1556    Clinical Impression Statement A: Pt with soreness today with any movement and exercise. Unable to tolerate manual therapy due to pain. pt reports increased pain with just touch and no pressure along bicep of left arm. Complete most exercises as A/ROM supine although was unable to complete all exercises without assisstance of cane when seated. Limited ROM during passive stretching and unable to tolerate very fair due to pain.    Plan P: Reassessment. Determine if more therapy is needed. Continue to work on completing all exercises as A/ROM with good form.      Patient will benefit from skilled therapeutic intervention in order to improve the following deficits and impairments:  Decreased strength, Impaired flexibility, Decreased mobility, Decreased range of motion, Pain, Increased fascial restricitons, Impaired UE functional use  Visit Diagnosis: Pain in left upper arm  Other symptoms and signs involving the musculoskeletal system  Stiffness of left shoulder, not elsewhere classified    Problem List Patient Active Problem List   Diagnosis Date Noted  . Cough 12/16/2015  . Osteoporosis 12/05/2015  . Biatrial enlargement 12/01/2015  . Pulmonary hypertension, moderate to severe ([place]) 12/01/2015  . Closed fracture of intertrochanteric section of femur ([place])   . Chronic atrial fibrillation ([place]) 11/30/2015  . Hypothyroidism 11/29/2015  . Thrombocytopenia ([place]) 07/13/2015  . Chronic anticoagulation 07/13/2015  . H/O mitral valve replacement 07/13/2015  . Vascular dementia 07/12/2015  . Essential hypertension 07/12/2015   Ailene Ravel, OTR/L,CBIS  [phone]  04/18/2017, 3:59 PM  [facility] [address] [place], [place], [address] Phone: [phone]   Fax:  [phone]  Name: Courtney [name] MRN: [id number] Date of Birth: 1929-04-21

## 2017-04-21 ENCOUNTER — Ambulatory Visit (HOSPITAL_COMMUNITY): Payer: Medicare Other

## 2017-04-22 ENCOUNTER — Telehealth: Payer: Self-pay

## 2017-04-22 DIAGNOSIS — I639 Cerebral infarction, unspecified: Secondary | ICD-10-CM | POA: Insufficient documentation

## 2017-04-22 DIAGNOSIS — F0150 Vascular dementia without behavioral disturbance: Secondary | ICD-10-CM | POA: Insufficient documentation

## 2017-04-22 DIAGNOSIS — E538 Deficiency of other specified B group vitamins: Secondary | ICD-10-CM | POA: Insufficient documentation

## 2017-04-22 DIAGNOSIS — S22080A Wedge compression fracture of T11-T12 vertebra, initial encounter for closed fracture: Secondary | ICD-10-CM | POA: Insufficient documentation

## 2017-04-22 NOTE — Telephone Encounter (Signed)
Left message for patient to call office regarding PT/INR orders and why she did not have them done.

## 2017-04-23 ENCOUNTER — Telehealth: Payer: Self-pay

## 2017-04-23 DIAGNOSIS — Z952 Presence of prosthetic heart valve: Secondary | ICD-10-CM

## 2017-04-23 DIAGNOSIS — D696 Thrombocytopenia, unspecified: Secondary | ICD-10-CM

## 2017-04-23 DIAGNOSIS — I639 Cerebral infarction, unspecified: Secondary | ICD-10-CM

## 2017-04-23 LAB — PROTIME-INR
INR: 1.3 — AB
PROTHROMBIN TIME: 14 s — AB (ref 9.0–11.5)

## 2017-04-23 MED ORDER — WARFARIN SODIUM 5 MG PO TABS: ORAL_TABLET | ORAL | 0 refills | Status: DC

## 2017-04-23 NOTE — Telephone Encounter (Signed)
Please advise to take Coumadin 5 mg, 1/2 tablet each day. Return to lab for recheck on Tuesday, September 25. The 5 mg tablets have been sent into the pharmacy.

## 2017-04-23 NOTE — Telephone Encounter (Signed)
Patients current medication regiment: Coumadin 2mg  daily at bedtime, plavix 75 mg daily.  Please advise.

## 2017-04-23 NOTE — Telephone Encounter (Signed)
Advised patients son of medication change.  Orders placed.

## 2017-04-23 NOTE — Telephone Encounter (Signed)
-----   Message from Caren Macadam, MD sent at 04/23/2017  8:20 AM EDT ----- Please call and confirm dosing with patient's son. Advise that INR is low and that we will need to adjust. INR is 1.3. Needs to be higher. Will you also see if [facility] has called them.

## 2017-04-24 ENCOUNTER — Ambulatory Visit (INDEPENDENT_AMBULATORY_CARE_PROVIDER_SITE_OTHER): Payer: Self-pay | Admitting: Orthopaedic Surgery

## 2017-04-24 ENCOUNTER — Encounter (HOSPITAL_COMMUNITY): Payer: Self-pay | Admitting: Occupational Therapy

## 2017-04-24 ENCOUNTER — Ambulatory Visit (INDEPENDENT_AMBULATORY_CARE_PROVIDER_SITE_OTHER): Payer: Medicare Other

## 2017-04-24 ENCOUNTER — Ambulatory Visit (HOSPITAL_COMMUNITY): Payer: Medicare Other | Admitting: Occupational Therapy

## 2017-04-24 ENCOUNTER — Encounter: Payer: Self-pay | Admitting: Orthopaedic Surgery

## 2017-04-24 DIAGNOSIS — S42225D 2-part nondisplaced fracture of surgical neck of left humerus, subsequent encounter for fracture with routine healing: Secondary | ICD-10-CM

## 2017-04-24 DIAGNOSIS — M79622 Pain in left upper arm: Secondary | ICD-10-CM

## 2017-04-24 DIAGNOSIS — R29898 Other symptoms and signs involving the musculoskeletal system: Secondary | ICD-10-CM

## 2017-04-24 DIAGNOSIS — M25612 Stiffness of left shoulder, not elsewhere classified: Secondary | ICD-10-CM

## 2017-04-24 NOTE — Therapy (Signed)
[facility], [place], [address] Phone: [phone]   Fax:  [phone]  Occupational Therapy Reassessment and Treatment  Patient Details  Name: Courtney [name] MRN: [id number] Date of Birth: 1928-09-18 Referring Provider: Sanjuana Kava, MD  Encounter Date: 04/24/2017      OT End of Session - 04/24/17 1608    Visit Number 8   Number of Visits 16   Date for OT Re-Evaluation 05/24/17   Authorization Type Medicare - copay $20   Authorization Time Period before 18th visit   Authorization - Visit Number 8   Authorization - Number of Visits 18   OT Start Time 1527  pt arrived late from MD appt   OT Stop Time 1600   OT Time Calculation (min) 33 min   Activity Tolerance Patient tolerated treatment well   Behavior During Therapy [facility] for tasks assessed/performed      Past Medical History:  Diagnosis Date  . Biatrial enlargement 12/01/2015  . Chronic anticoagulation    coumadin, CHADS2VASC=6  . History of mitral valve replacement   . Hypertension   . Hypothyroidism   . Osteoporosis    on Boniva  . Permanent atrial fibrillation ([place])   . Pulmonary hypertension, moderate to severe ([place]) 12/01/2015  . Stroke [facility])    on Clopidrigel  . UTI (lower urinary tract infection) 07/2015   n&v; AKI  . Vascular dementia     Past Surgical History:  Procedure Laterality Date  . ABDOMINAL HYSTERECTOMY    . FRACTURE SURGERY  [id number]   Operative repair with intramedullary nail intratrochanteric Left Hip fracture  . INTRAMEDULLARY (IM) NAIL INTERTROCHANTERIC Left 12/01/2015   Procedure: INTRAMEDULLARY (IM) NAIL INTERTROCHANTRIC;  Surgeon: Carole Civil, MD;  Location: [facility]S;  Service: Orthopedics;  Laterality: Left;  . MITRAL VALVE REPLACEMENT      There were no vitals filed for this visit.      Subjective Assessment - 04/24/17 1529    Subjective  S: I just came from the doctor.    Currently in Pain? No/denies             [facility] OT Assessment - 04/24/17 1529      Assessment   Diagnosis Left humerus fx     Precautions   Precautions None     Palpation   Palpation comment Pt presentd with min fascial restrictions along the left upper arm, shoulder and scapularis regions.     AROM   Overall AROM Comments Assessed seated. IR/er adducted.   AROM Assessment Site Shoulder   Right/Left Shoulder Left   Left Shoulder Flexion 95 Degrees  92 previous   Left Shoulder ABduction 90 Degrees  81 previous   Left Shoulder Internal Rotation 90 Degrees  same as previous   Left Shoulder External Rotation 48 Degrees  46 previous     PROM   Overall PROM Comments Assessed supine. IR/er adducted.   PROM Assessment Site Shoulder   Right/Left Shoulder Left   Left Shoulder Flexion 140 Degrees  113 previous   Left Shoulder ABduction 120 Degrees  105 previous   Left Shoulder Internal Rotation 90 Degrees  same as previous   Left Shoulder External Rotation 58 Degrees  58 previous     Strength   Overall Strength Comments Assessed seated. IR/er adducted.   Strength Assessment Site Shoulder   Right/Left Shoulder Left   Left Shoulder Flexion 3/5  3-/5 previous   Left Shoulder ABduction 3/5  3-/5 previous  Left Shoulder Internal Rotation 3/5  3-/5 previous   Left Shoulder External Rotation 3/5  3-/5 previous                  OT Treatments/Exercises (OP) - 04/24/17 1529      Exercises   Exercises Shoulder     Shoulder Exercises: Supine   Protraction PROM;5 reps   Horizontal ABduction PROM;5 reps   External Rotation PROM;5 reps   Internal Rotation PROM;5 reps   Flexion PROM;5 reps   ABduction PROM;5 reps     Shoulder Exercises: Seated   Protraction AAROM;10 reps   External Rotation AAROM;10 reps   Internal Rotation AAROM;10 reps   Flexion AAROM;10 reps     Shoulder Exercises: Pulleys   Flexion 1 minute                  OT Short Term Goals - 03/25/17 1458      OT  SHORT TERM GOAL #1   Title Pt will understand and be independent with the HEP provided to facilitate her progress with using LUE in daily activities.   Time 4   Period Weeks   Status On-going     OT SHORT TERM GOAL #2   Title Patient will return to highest level of independence with all daily and leisure tasks using her LUE.   Time 4   Period Weeks   Status On-going     OT SHORT TERM GOAL #3   Title Pt will have decreased fascial restrictions to trace amount in her left upper arm region to relieve tightness and tenderness to facilitate pain-free movements with ADLs.   Time 4   Period Weeks   Status On-going     OT SHORT TERM GOAL #4   Title Patient will increase A/ROM to [facility] to increase ability to complete dressing tasks with increased comfort level.   Time 4   Period Weeks   Status On-going     OT SHORT TERM GOAL #5   Title Patient will increase LUE strength to 4/5 to increase ability to get in and out of bed with more comfort.   Time 4   Period Weeks   Status On-going                  Plan - 04/24/17 1608    Clinical Impression Statement A: Reassessment completed this session, pt is making slow progress towards goals, however no goals met at this time. Pt continued to have weakness limiting functional reaching and use of the LUE during ADL completion. Today's session worked on AA/ROM and achieving correct form and end range stretch with each exercise. Increased time required and intermittent rest breaks. Verbal and tactile cuing for correct form, verbal cuing to reduce guarding.    Occupational performance deficits (Please refer to evaluation for details): ADL's;IADL's;Rest and Sleep;Leisure   Rehab Potential Good   OT Frequency 2x / week   OT Duration 4 weeks   OT Treatment/Interventions Therapeutic exercise;Patient/family education;Self-care/ADL training;Ultrasound;Manual Therapy;Iontophoresis;Cryotherapy;DME and/or AE instruction;Therapeutic activities;Electrical  Stimulation;Moist Heat;Passive range of motion   Plan P: continue with therapy 2x/week for 4 weeks focusing on functional strengthening required for B/ADL completion and decreasing pain.   OT Home Exercise Plan 03/10/17: Table slides; 8/23: AA/ROM exercises   Consulted and Agree with Plan of Care Patient;Family member/caregiver   Family Member Consulted Son      Patient will benefit from skilled therapeutic intervention in order to improve the following deficits and impairments:  Decreased strength, Impaired flexibility, Decreased mobility, Decreased range of motion, Pain, Increased fascial restricitons, Impaired UE functional use  Visit Diagnosis: Pain in left upper arm  Other symptoms and signs involving the musculoskeletal system  Stiffness of left shoulder, not elsewhere classified      G-Codes - 2017-04-26 1624    Functional Assessment Tool Used (Outpatient only) FOTO score: 51/100 (49% impaired)   Functional Limitation Carrying, moving and handling objects   Carrying, Moving and Handling Objects Current Status (Q0165) At least 40 percent but less than 60 percent impaired, limited or restricted   Carrying, Moving and Handling Objects Goal Status (E0063) At least 20 percent but less than 40 percent impaired, limited or restricted      Problem List Patient Active Problem List   Diagnosis Date Noted  . T12 compression fracture ([place]) 04/22/2017  . Dementia, multi-infarct 04/22/2017  . Cardioembolic stroke ([place]) 49/49/4473  . B12 deficiency 04/22/2017  . Cough 12/16/2015  . Osteoporosis 12/05/2015  . Biatrial enlargement 12/01/2015  . Pulmonary hypertension, moderate to severe ([place]) 12/01/2015  . Closed fracture of intertrochanteric section of femur ([place])   . Chronic atrial fibrillation ([place]) 11/30/2015  . Hypothyroidism 11/29/2015  . Thrombocytopenia ([place]) 07/13/2015  . Chronic anticoagulation 07/13/2015  . H/O mitral valve replacement 07/13/2015  . Vascular dementia  07/12/2015  . Essential hypertension 07/12/2015   Guadelupe Sabin, OTR/L  [phone] 04-26-2017, 4:26 PM  [facility] [address] [place], [place], [address] Phone: [phone]   Fax:  [phone]  Name: Courtney [name] MRN: [id number] Date of Birth: [birth year]

## 2017-04-24 NOTE — Progress Notes (Signed)
CC:  My shoulder does not hurt  She is doing well with the left shoulder.  She has minimal to no discomfort.  ROM of the left shoulder is good but not full.  NV is intact.  X-rays were done of the left shoulder, reported separately.  Encounter Diagnosis  Name Primary?  . Closed 2-part nondisplaced fracture of surgical neck of left humerus with routine healing, subsequent encounter Yes   Return only as needed, discharge.  Call if any problem.  Precautions discussed.   Electronically Signed Sanjuana Kava, MD 9/20/20182:55 PM

## 2017-04-29 ENCOUNTER — Telehealth: Payer: Self-pay | Admitting: Family Medicine

## 2017-04-29 DIAGNOSIS — D696 Thrombocytopenia, unspecified: Secondary | ICD-10-CM

## 2017-04-29 LAB — PT WITH INR/FINGERSTICK
INR, fingerstick: 2 ratio — ABNORMAL HIGH
PT, fingerstick: 23.6 s — ABNORMAL HIGH (ref 10.5–13.1)

## 2017-04-29 NOTE — Telephone Encounter (Signed)
PT/INR order updates per PCP

## 2017-05-01 ENCOUNTER — Ambulatory Visit (HOSPITAL_COMMUNITY): Payer: Medicare Other | Admitting: Occupational Therapy

## 2017-05-01 ENCOUNTER — Encounter (HOSPITAL_COMMUNITY): Payer: Self-pay | Admitting: Occupational Therapy

## 2017-05-01 DIAGNOSIS — R29898 Other symptoms and signs involving the musculoskeletal system: Secondary | ICD-10-CM

## 2017-05-01 DIAGNOSIS — M25612 Stiffness of left shoulder, not elsewhere classified: Secondary | ICD-10-CM

## 2017-05-01 DIAGNOSIS — M79622 Pain in left upper arm: Secondary | ICD-10-CM

## 2017-05-01 NOTE — Therapy (Signed)
[place], [place], [address] Phone: [phone]   Fax:  [phone]  Occupational Therapy Treatment  Patient Details  Name: Courtney [name] MRN: [id number] Date of Birth: Apr 01, 1929 Referring Provider: Sanjuana Kava, MD  Encounter Date: 05/01/2017      OT End of Session - 05/01/17 1618    Visit Number 9   Number of Visits 16   Date for OT Re-Evaluation 05/24/17   Authorization Type Medicare - copay $20   Authorization Time Period before 18th visit   Authorization - Visit Number 9   Authorization - Number of Visits 18   OT Start Time 1526  pt arrived late   OT Stop Time 1600   OT Time Calculation (min) 34 min   Activity Tolerance Patient tolerated treatment well   Behavior During Therapy [facility] for tasks assessed/performed      Past Medical History:  Diagnosis Date  . Biatrial enlargement 12/01/2015  . Chronic anticoagulation    coumadin, CHADS2VASC=6  . History of mitral valve replacement   . Hypertension   . Hypothyroidism   . Osteoporosis    on Boniva  . Permanent atrial fibrillation ([place])   . Pulmonary hypertension, moderate to severe ([place]) 12/01/2015  . Stroke [facility])    on Clopidrigel  . UTI (lower urinary tract infection) 07/2015   n&v; AKI  . Vascular dementia     Past Surgical History:  Procedure Laterality Date  . ABDOMINAL HYSTERECTOMY    . FRACTURE SURGERY  [id number]   Operative repair with intramedullary nail intratrochanteric Left Hip fracture  . INTRAMEDULLARY (IM) NAIL INTERTROCHANTERIC Left 12/01/2015   Procedure: INTRAMEDULLARY (IM) NAIL INTERTROCHANTRIC;  Surgeon: Carole Civil, MD;  Location: [facility];  Service: Orthopedics;  Laterality: Left;  . MITRAL VALVE REPLACEMENT      There were no vitals filed for this visit.      Subjective Assessment - 05/01/17 1527    Subjective  S: My arm is worse, it really hurts today.    Currently in Pain? Yes   Pain Location Shoulder   Pain  Orientation Left   Pain Descriptors / Indicators Sore   Pain Type Chronic pain   Pain Radiating Towards n/a   Pain Onset In the past 7 days   Pain Frequency Intermittent   Aggravating Factors  certain motions, lifting heavy objects   Pain Relieving Factors unknown   Effect of Pain on Daily Activities minimal effect on ADL completion            [facility] OT Assessment - 05/01/17 1526      Assessment   Diagnosis Left humerus fx     Precautions   Precautions None                  OT Treatments/Exercises (OP) - 05/01/17 1534      Exercises   Exercises Shoulder     Shoulder Exercises: Supine   Protraction PROM;5 reps;AAROM;10 reps   Horizontal ABduction PROM;5 reps   External Rotation PROM;5 reps;AAROM;10 reps   Internal Rotation PROM;5 reps;AAROM;10 reps   Flexion PROM;5 reps   ABduction PROM;5 reps     Shoulder Exercises: Seated   Elevation AROM;10 reps   Extension AROM;10 reps   Row AROM;10 reps     Manual Therapy   Manual Therapy Myofascial release   Manual therapy comments completed separately from therapeutic exercise   Myofascial Release Myofascial release to left upper arm, trapezius, and scapularis regions to  decrease pain and fascial restrictions and increase range of motion                  OT Short Term Goals - 03/25/17 1458      OT SHORT TERM GOAL #1   Title Pt will understand and be independent with the HEP provided to facilitate her progress with using LUE in daily activities.   Time 4   Period Weeks   Status On-going     OT SHORT TERM GOAL #2   Title Patient will return to highest level of independence with all daily and leisure tasks using her LUE.   Time 4   Period Weeks   Status On-going     OT SHORT TERM GOAL #3   Title Pt will have decreased fascial restrictions to trace amount in her left upper arm region to relieve tightness and tenderness to facilitate pain-free movements with ADLs.   Time 4   Period Weeks   Status  On-going     OT SHORT TERM GOAL #4   Title Patient will increase A/ROM to [facility] to increase ability to complete dressing tasks with increased comfort level.   Time 4   Period Weeks   Status On-going     OT SHORT TERM GOAL #5   Title Patient will increase LUE strength to 4/5 to increase ability to get in and out of bed with more comfort.   Time 4   Period Weeks   Status On-going                  Plan - 05/01/17 1618    Clinical Impression Statement A: Pt son reports pt has been complaining of pain in her left arm for a few days, notes pt was lifting heavy laundry from a top loading washing and transferring to dryer and rode the arm/leg bike at the [facility] for 50 minutes yesterday. During session pt with pain along medial deltoid and bicep limiting ability to perform ROM exercises. Pt does not display increased tenderness during manual therapy, unable to tolerate full passive stretching. Instructed pt and son to allow arm to rest over weekend and not to complete exercises or work on gym equipment with LUE.    Plan P: Follow up on left shoulder pain, resume AA/ROM if pt able to tolerate   OT Home Exercise Plan 03/10/17: Table slides; 8/23: AA/ROM exercises   Consulted and Agree with Plan of Care Family member/caregiver;Patient   Family Member Consulted Son      Patient will benefit from skilled therapeutic intervention in order to improve the following deficits and impairments:  Decreased strength, Impaired flexibility, Decreased mobility, Decreased range of motion, Pain, Increased fascial restricitons, Impaired UE functional use  Visit Diagnosis: Pain in left upper arm  Other symptoms and signs involving the musculoskeletal system  Stiffness of left shoulder, not elsewhere classified    Problem List Patient Active Problem List   Diagnosis Date Noted  . T12 compression fracture ([place]) 04/22/2017  . Dementia, multi-infarct 04/22/2017  . Cardioembolic stroke ([place]) 62/83/1517  .  B12 deficiency 04/22/2017  . Cough 12/16/2015  . Osteoporosis 12/05/2015  . Biatrial enlargement 12/01/2015  . Pulmonary hypertension, moderate to severe ([place]) 12/01/2015  . Closed fracture of intertrochanteric section of femur ([place])   . Chronic atrial fibrillation ([place]) 11/30/2015  . Hypothyroidism 11/29/2015  . Thrombocytopenia ([place]) 07/13/2015  . Chronic anticoagulation 07/13/2015  . H/O mitral valve replacement 07/13/2015  . Vascular dementia 07/12/2015  .  Essential hypertension 07/12/2015   Guadelupe Sabin, OTR/L  [phone] 05/01/2017, 4:22 PM  [place] [address] [place], [place], [address] Phone: [phone]   Fax:  [phone]  Name: Krissy Orebaugh MRN: [id number] Date of Birth: Feb 27, 1929

## 2017-05-07 ENCOUNTER — Ambulatory Visit (HOSPITAL_COMMUNITY): Payer: Medicare Other | Attending: Orthopaedic Surgery | Admitting: Occupational Therapy

## 2017-05-07 ENCOUNTER — Encounter (HOSPITAL_COMMUNITY): Payer: Self-pay | Admitting: Occupational Therapy

## 2017-05-07 DIAGNOSIS — R29898 Other symptoms and signs involving the musculoskeletal system: Secondary | ICD-10-CM | POA: Diagnosis present

## 2017-05-07 DIAGNOSIS — M25612 Stiffness of left shoulder, not elsewhere classified: Secondary | ICD-10-CM | POA: Insufficient documentation

## 2017-05-07 DIAGNOSIS — M79622 Pain in left upper arm: Secondary | ICD-10-CM | POA: Insufficient documentation

## 2017-05-07 NOTE — Therapy (Signed)
[place], [place], [address] Phone: [phone]   Fax:  [phone]  Occupational Therapy Treatment  Patient Details  Name: Courtney [name] MRN: [id number] Date of Birth: Sep 06, 1928 Referring Provider: Sanjuana Kava, MD  Encounter Date: 05/07/2017      OT End of Session - 05/07/17 1517    Visit Number 10   Number of Visits 16   Date for OT Re-Evaluation 05/24/17   Authorization Type Medicare - copay $20   Authorization Time Period before 18th visit   Authorization - Visit Number 10   Authorization - Number of Visits 18   OT Start Time 7253   OT Stop Time 1515   OT Time Calculation (min) 43 min   Activity Tolerance Patient tolerated treatment well   Behavior During Therapy [facility] for tasks assessed/performed      Past Medical History:  Diagnosis Date  . Biatrial enlargement 12/01/2015  . Chronic anticoagulation    coumadin, CHADS2VASC=6  . History of mitral valve replacement   . Hypertension   . Hypothyroidism   . Osteoporosis    on Boniva  . Permanent atrial fibrillation ([place])   . Pulmonary hypertension, moderate to severe ([place]) 12/01/2015  . Stroke [facility])    on Clopidrigel  . UTI (lower urinary tract infection) 07/2015   n&v; AKI  . Vascular dementia     Past Surgical History:  Procedure Laterality Date  . ABDOMINAL HYSTERECTOMY    . FRACTURE SURGERY  [id number]   Operative repair with intramedullary nail intratrochanteric Left Hip fracture  . INTRAMEDULLARY (IM) NAIL INTERTROCHANTERIC Left 12/01/2015   Procedure: INTRAMEDULLARY (IM) NAIL INTERTROCHANTRIC;  Surgeon: Carole Civil, MD;  Location: [facility];  Service: Orthopedics;  Laterality: Left;  . MITRAL VALVE REPLACEMENT      There were no vitals filed for this visit.      Subjective Assessment - 05/07/17 1436    Subjective  S: It doesn't hurt when I'm being still.    Currently in Pain? Yes            [facility] OT Assessment - 05/07/17 1435       Assessment   Diagnosis Left humerus fx     Precautions   Precautions None                  OT Treatments/Exercises (OP) - 05/07/17 1437      Exercises   Exercises Shoulder     Shoulder Exercises: Supine   Protraction PROM;5 reps;AAROM;10 reps   Horizontal ABduction PROM;5 reps;AAROM;10 reps   Horizontal ABduction Limitations 1 rest break   External Rotation PROM;5 reps;AAROM;10 reps   Internal Rotation PROM;5 reps;AAROM;10 reps   Flexion PROM;5 reps;AAROM;10 reps   Flexion Limitations 1 rest break   ABduction PROM;5 reps     Shoulder Exercises: Seated   Protraction AAROM;10 reps   Protraction Limitations 1 rest break   External Rotation AROM;10 reps   Internal Rotation AROM;10 reps   Flexion AAROM;10 reps   Flexion Limitations 50% range; 1 rest break     Shoulder Exercises: Pulleys   Flexion 1 minute     Manual Therapy   Manual Therapy Myofascial release   Manual therapy comments completed separately from therapeutic exercise   Myofascial Release Myofascial release to left upper arm, trapezius, and scapularis regions to decrease pain and fascial restrictions and increase range of motion  OT Short Term Goals - 03/25/17 1458      OT SHORT TERM GOAL #1   Title Pt will understand and be independent with the HEP provided to facilitate her progress with using LUE in daily activities.   Time 4   Period Weeks   Status On-going     OT SHORT TERM GOAL #2   Title Patient will return to highest level of independence with all daily and leisure tasks using her LUE.   Time 4   Period Weeks   Status On-going     OT SHORT TERM GOAL #3   Title Pt will have decreased fascial restrictions to trace amount in her left upper arm region to relieve tightness and tenderness to facilitate pain-free movements with ADLs.   Time 4   Period Weeks   Status On-going     OT SHORT TERM GOAL #4   Title Patient will increase A/ROM to [facility] to increase  ability to complete dressing tasks with increased comfort level.   Time 4   Period Weeks   Status On-going     OT SHORT TERM GOAL #5   Title Patient will increase LUE strength to 4/5 to increase ability to get in and out of bed with more comfort.   Time 4   Period Weeks   Status On-going                  Plan - 05/07/17 1517    Clinical Impression Statement A: Pt reports continued soreness when moving arm, however no pain at rest. Pt son reports pt has not complained of pain in the past 2 days. During session pt is very guarded during P/ROM, limiting range achieved, however during AA/ROM is able to achieve 50-60% range with minimal soreness. Verbal cuing required for pt to reach end range stretch and for form and technique. Encouraged pt and son to continue to limit arm exercises at the [facility].    Plan P: Continue with AA/ROM working within pain tolerance. Add pulleys in abduction and work on correct for with all exercises   OT Home Exercise Plan 03/10/17: Table slides; 8/23: AA/ROM exercises   Consulted and Agree with Plan of Care Patient;Family member/caregiver   Family Member Consulted Son      Patient will benefit from skilled therapeutic intervention in order to improve the following deficits and impairments:  Decreased strength, Impaired flexibility, Decreased mobility, Decreased range of motion, Pain, Increased fascial restricitons, Impaired UE functional use  Visit Diagnosis: Pain in left upper arm  Other symptoms and signs involving the musculoskeletal system  Stiffness of left shoulder, not elsewhere classified    Problem List Patient Active Problem List   Diagnosis Date Noted  . T12 compression fracture ([place]) 04/22/2017  . Dementia, multi-infarct 04/22/2017  . Cardioembolic stroke ([place]) 26/83/4196  . B12 deficiency 04/22/2017  . Cough 12/16/2015  . Osteoporosis 12/05/2015  . Biatrial enlargement 12/01/2015  . Pulmonary hypertension, moderate to severe ([place])  12/01/2015  . Closed fracture of intertrochanteric section of femur ([place])   . Chronic atrial fibrillation ([place]) 11/30/2015  . Hypothyroidism 11/29/2015  . Thrombocytopenia ([place]) 07/13/2015  . Chronic anticoagulation 07/13/2015  . H/O mitral valve replacement 07/13/2015  . Vascular dementia 07/12/2015  . Essential hypertension 07/12/2015   Guadelupe Sabin, OTR/L  [phone] 05/07/2017, 3:20 PM  [place] [address] [place], [place], [address] Phone: [phone]   Fax:  [phone]  Name: Courtney [name] MRN: [id number] Date  of Birth: 02-20-1929

## 2017-05-09 ENCOUNTER — Ambulatory Visit (HOSPITAL_COMMUNITY): Payer: Medicare Other | Admitting: Occupational Therapy

## 2017-05-09 ENCOUNTER — Encounter (HOSPITAL_COMMUNITY): Payer: Self-pay | Admitting: Occupational Therapy

## 2017-05-09 DIAGNOSIS — M25612 Stiffness of left shoulder, not elsewhere classified: Secondary | ICD-10-CM

## 2017-05-09 DIAGNOSIS — R29898 Other symptoms and signs involving the musculoskeletal system: Secondary | ICD-10-CM

## 2017-05-09 DIAGNOSIS — M79622 Pain in left upper arm: Secondary | ICD-10-CM | POA: Diagnosis not present

## 2017-05-09 NOTE — Therapy (Signed)
[place], [place], [address] Phone: [phone]   Fax:  [phone]  Occupational Therapy Treatment  Patient Details  Name: Courtney [name] MRN: [id number] Date of Birth: Apr 03, 1929 Referring Provider: Sanjuana Kava, MD  Encounter Date: 05/09/2017      OT End of Session - 05/09/17 1518    Visit Number 11   Number of Visits 16   Date for OT Re-Evaluation 05/24/17   Authorization Type Medicare - copay $20   Authorization Time Period before 18th visit   Authorization - Visit Number 11   Authorization - Number of Visits 18   OT Start Time 1438  pt arrived late   OT Stop Time 1516   OT Time Calculation (min) 38 min   Activity Tolerance Patient tolerated treatment well   Behavior During Therapy [facility] for tasks assessed/performed      Past Medical History:  Diagnosis Date  . Biatrial enlargement 12/01/2015  . Chronic anticoagulation    coumadin, CHADS2VASC=6  . History of mitral valve replacement   . Hypertension   . Hypothyroidism   . Osteoporosis    on Boniva  . Permanent atrial fibrillation ([place])   . Pulmonary hypertension, moderate to severe ([place]) 12/01/2015  . Stroke [facility])    on Clopidrigel  . UTI (lower urinary tract infection) 07/2015   n&v; AKI  . Vascular dementia     Past Surgical History:  Procedure Laterality Date  . ABDOMINAL HYSTERECTOMY    . FRACTURE SURGERY  [id number]   Operative repair with intramedullary nail intratrochanteric Left Hip fracture  . INTRAMEDULLARY (IM) NAIL INTERTROCHANTERIC Left 12/01/2015   Procedure: INTRAMEDULLARY (IM) NAIL INTERTROCHANTRIC;  Surgeon: Carole Civil, MD;  Location: [facility];  Service: Orthopedics;  Laterality: Left;  . MITRAL VALVE REPLACEMENT      There were no vitals filed for this visit.      Subjective Assessment - 05/09/17 1442    Subjective  S: Not a bit of pain right now.    Currently in Pain? No/denies            [facility] OT Assessment -  05/09/17 1442      Assessment   Diagnosis Left humerus fx     Precautions   Precautions None                  OT Treatments/Exercises (OP) - 05/09/17 1443      Exercises   Exercises Shoulder     Shoulder Exercises: Supine   Protraction PROM;5 reps;AAROM;10 reps   Horizontal ABduction PROM;5 reps;AAROM;10 reps   External Rotation PROM;5 reps;AAROM;10 reps   Internal Rotation PROM;5 reps;AAROM;10 reps   Flexion PROM;5 reps;AAROM;10 reps   ABduction PROM;5 reps     Shoulder Exercises: Seated   Protraction AAROM;10 reps   External Rotation AROM;10 reps   Internal Rotation AROM;10 reps   Flexion AAROM;10 reps   Flexion Limitations 50% range; 1 rest break     Shoulder Exercises: Pulleys   Flexion 1 minute   ABduction Limitations   ABduction Limitations not completed due to poor form     Shoulder Exercises: ROM/Strengthening   Proximal Shoulder Strengthening, Supine 10X no rest breaks     Manual Therapy   Manual Therapy Myofascial release   Manual therapy comments completed separately from therapeutic exercise   Myofascial Release Myofascial release to left upper arm, trapezius, and scapularis regions to decrease pain and fascial restrictions and increase range of motion  OT Short Term Goals - 03/25/17 1458      OT SHORT TERM GOAL #1   Title Pt will understand and be independent with the HEP provided to facilitate her progress with using LUE in daily activities.   Time 4   Period Weeks   Status On-going     OT SHORT TERM GOAL #2   Title Patient will return to highest level of independence with all daily and leisure tasks using her LUE.   Time 4   Period Weeks   Status On-going     OT SHORT TERM GOAL #3   Title Pt will have decreased fascial restrictions to trace amount in her left upper arm region to relieve tightness and tenderness to facilitate pain-free movements with ADLs.   Time 4   Period Weeks   Status On-going      OT SHORT TERM GOAL #4   Title Patient will increase A/ROM to [facility] to increase ability to complete dressing tasks with increased comfort level.   Time 4   Period Weeks   Status On-going     OT SHORT TERM GOAL #5   Title Patient will increase LUE strength to 4/5 to increase ability to get in and out of bed with more comfort.   Time 4   Period Weeks   Status On-going                  Plan - 05/09/17 1519    Clinical Impression Statement A: Pt reports no pain today or yesterday. Son reports pt went to [facility] after last OT session and completed exercises with no problem. Pt with greater ease of completion during AA/ROM exercises supine, continues to be limited by strength while in sitting. Pt unable to complete abduction pulleys with good form. Verbal and visual cuing for all exercises.    Plan P: continue to work on improving range and strength within pain tolerance. Attempt wall wash and pvc pipe slide.    OT Home Exercise Plan 03/10/17: Table slides; 8/23: AA/ROM exercises   Consulted and Agree with Plan of Care Patient;Family member/caregiver   Family Member Consulted Son      Patient will benefit from skilled therapeutic intervention in order to improve the following deficits and impairments:  Decreased strength, Impaired flexibility, Decreased mobility, Decreased range of motion, Pain, Increased fascial restricitons, Impaired UE functional use  Visit Diagnosis: Pain in left upper arm  Other symptoms and signs involving the musculoskeletal system  Stiffness of left shoulder, not elsewhere classified    Problem List Patient Active Problem List   Diagnosis Date Noted  . T12 compression fracture ([place]) 04/22/2017  . Dementia, multi-infarct 04/22/2017  . Cardioembolic stroke ([place]) 93/71/6967  . B12 deficiency 04/22/2017  . Cough 12/16/2015  . Osteoporosis 12/05/2015  . Biatrial enlargement 12/01/2015  . Pulmonary hypertension, moderate to severe ([place]) 12/01/2015  . Closed  fracture of intertrochanteric section of femur ([place])   . Chronic atrial fibrillation ([place]) 11/30/2015  . Hypothyroidism 11/29/2015  . Thrombocytopenia ([place]) 07/13/2015  . Chronic anticoagulation 07/13/2015  . H/O mitral valve replacement 07/13/2015  . Vascular dementia 07/12/2015  . Essential hypertension 07/12/2015   Guadelupe Sabin, OTR/L  [phone] 05/09/2017, 3:21 PM  [place] [address] [place], [place], [address] Phone: [phone]   Fax:  [phone]  Name: Courtney [name] MRN: [id number] Date of Birth: [birth year]

## 2017-05-13 ENCOUNTER — Encounter (HOSPITAL_COMMUNITY): Payer: Self-pay

## 2017-05-13 ENCOUNTER — Ambulatory Visit (HOSPITAL_COMMUNITY): Payer: Medicare Other

## 2017-05-13 DIAGNOSIS — M25612 Stiffness of left shoulder, not elsewhere classified: Secondary | ICD-10-CM

## 2017-05-13 DIAGNOSIS — M79622 Pain in left upper arm: Secondary | ICD-10-CM

## 2017-05-13 DIAGNOSIS — R29898 Other symptoms and signs involving the musculoskeletal system: Secondary | ICD-10-CM

## 2017-05-13 NOTE — Therapy (Signed)
[place], [place], [address] Phone: [phone]   Fax:  [phone]  Occupational Therapy Treatment  Patient Details  Name: Courtney [name] MRN: [id number] Date of Birth: January 14, 1929 Referring Provider: Sanjuana Kava, MD  Encounter Date: 05/13/2017      OT End of Session - 05/13/17 1636    Visit Number 12   Number of Visits 16   Date for OT Re-Evaluation 05/24/17   Authorization Type Medicare - copay $20   Authorization Time Period before 18th visit   Authorization - Visit Number 12   Authorization - Number of Visits 18   OT Start Time 1523  Pt arrived late   OT Stop Time 1600   OT Time Calculation (min) 37 min   Activity Tolerance Patient tolerated treatment well   Behavior During Therapy [facility] for tasks assessed/performed      Past Medical History:  Diagnosis Date  . Biatrial enlargement 12/01/2015  . Chronic anticoagulation    coumadin, CHADS2VASC=6  . History of mitral valve replacement   . Hypertension   . Hypothyroidism   . Osteoporosis    on Boniva  . Permanent atrial fibrillation ([place])   . Pulmonary hypertension, moderate to severe ([place]) 12/01/2015  . Stroke [facility])    on Clopidrigel  . UTI (lower urinary tract infection) 07/2015   n&v; AKI  . Vascular dementia     Past Surgical History:  Procedure Laterality Date  . ABDOMINAL HYSTERECTOMY    . FRACTURE SURGERY  [id number]   Operative repair with intramedullary nail intratrochanteric Left Hip fracture  . INTRAMEDULLARY (IM) NAIL INTERTROCHANTERIC Left 12/01/2015   Procedure: INTRAMEDULLARY (IM) NAIL INTERTROCHANTRIC;  Surgeon: Carole Civil, MD;  Location: [facility];  Service: Orthopedics;  Laterality: Left;  . MITRAL VALVE REPLACEMENT      There were no vitals filed for this visit.      Subjective Assessment - 05/13/17 1631    Subjective  S: It doesn't hurt today.   Currently in Pain? No/denies            [facility] OT Assessment -  05/13/17 1634      Assessment   Diagnosis Left humerus fx     Precautions   Precautions None                  OT Treatments/Exercises (OP) - 05/13/17 1634      Exercises   Exercises Shoulder     Shoulder Exercises: Supine   Protraction PROM;5 reps;AAROM;10 reps   Horizontal ABduction PROM;5 reps;AAROM;10 reps   External Rotation PROM;5 reps;AAROM;10 reps   Internal Rotation PROM;5 reps;AAROM;10 reps   Flexion PROM;5 reps;AAROM;10 reps   ABduction PROM;5 reps     Shoulder Exercises: Standing   Other Standing Exercises PVC pipe slide; flexion; 10X     Manual Therapy   Manual Therapy Myofascial release   Manual therapy comments completed separately from therapeutic exercise   Myofascial Release Myofascial release to left upper arm, trapezius, and scapularis regions to decrease pain and fascial restrictions and increase range of motion                  OT Short Term Goals - 03/25/17 1458      OT SHORT TERM GOAL #1   Title Pt will understand and be independent with the HEP provided to facilitate her progress with using LUE in daily activities.   Time 4   Period Weeks   Status On-going  OT SHORT TERM GOAL #2   Title Patient will return to highest level of independence with all daily and leisure tasks using her LUE.   Time 4   Period Weeks   Status On-going     OT SHORT TERM GOAL #3   Title Pt will have decreased fascial restrictions to trace amount in her left upper arm region to relieve tightness and tenderness to facilitate pain-free movements with ADLs.   Time 4   Period Weeks   Status On-going     OT SHORT TERM GOAL #4   Title Patient will increase A/ROM to [facility] to increase ability to complete dressing tasks with increased comfort level.   Time 4   Period Weeks   Status On-going     OT SHORT TERM GOAL #5   Title Patient will increase LUE strength to 4/5 to increase ability to get in and out of bed with more comfort.   Time 4   Period  Weeks   Status On-going                  Plan - 05/13/17 1636    Clinical Impression Statement A: Pt continues to be limited with passive ROM stretching only able to tolerate approximately half range or less. VC for form and technique needed. Able to add PVC pipe slide to increase shoulder ROM during functional reaching. Unable to add wall wash due to time constraint.    Plan Attempt wall wash. Continue with pulleys and pvc pipe slide.       Patient will benefit from skilled therapeutic intervention in order to improve the following deficits and impairments:  Decreased strength, Impaired flexibility, Decreased mobility, Decreased range of motion, Pain, Increased fascial restricitons, Impaired UE functional use  Visit Diagnosis: Pain in left upper arm  Other symptoms and signs involving the musculoskeletal system  Stiffness of left shoulder, not elsewhere classified    Problem List Patient Active Problem List   Diagnosis Date Noted  . T12 compression fracture ([place]) 04/22/2017  . Dementia, multi-infarct 04/22/2017  . Cardioembolic stroke ([place]) 59/56/3875  . B12 deficiency 04/22/2017  . Cough 12/16/2015  . Osteoporosis 12/05/2015  . Biatrial enlargement 12/01/2015  . Pulmonary hypertension, moderate to severe ([place]) 12/01/2015  . Closed fracture of intertrochanteric section of femur ([place])   . Chronic atrial fibrillation ([place]) 11/30/2015  . Hypothyroidism 11/29/2015  . Thrombocytopenia ([place]) 07/13/2015  . Chronic anticoagulation 07/13/2015  . H/O mitral valve replacement 07/13/2015  . Vascular dementia 07/12/2015  . Essential hypertension 07/12/2015   Ailene Ravel, OTR/L,CBIS  [phone]  05/13/2017, 4:39 PM  [place] [address] [place], [place], [address] Phone: [phone]   Fax:  [phone]  Name: Courtney [name] MRN: [id number] Date of Birth: 07/03/1929

## 2017-05-15 ENCOUNTER — Ambulatory Visit (HOSPITAL_COMMUNITY): Payer: Medicare Other

## 2017-05-19 ENCOUNTER — Ambulatory Visit (HOSPITAL_COMMUNITY): Payer: Medicare Other | Admitting: Specialist

## 2017-05-19 ENCOUNTER — Encounter (HOSPITAL_COMMUNITY): Payer: Self-pay | Admitting: Specialist

## 2017-05-19 DIAGNOSIS — M79622 Pain in left upper arm: Secondary | ICD-10-CM | POA: Diagnosis not present

## 2017-05-19 DIAGNOSIS — M25612 Stiffness of left shoulder, not elsewhere classified: Secondary | ICD-10-CM

## 2017-05-19 DIAGNOSIS — R29898 Other symptoms and signs involving the musculoskeletal system: Secondary | ICD-10-CM

## 2017-05-19 NOTE — Therapy (Signed)
[place], [place], [address] Phone: [phone]   Fax:  [phone]  Occupational Therapy Treatment  Patient Details  Name: Courtney [name] MRN: [id number] Date of Birth: Aug 18, 1928 Referring Provider: Sanjuana Kava, MD  Encounter Date: 05/19/2017      OT End of Session - 05/19/17 1450    Visit Number 13   Number of Visits 16   Date for OT Re-Evaluation 05/24/17   Authorization Type Medicare - copay $20   Authorization Time Period before 18th visit   Authorization - Visit Number 42   Authorization - Number of Visits 18   OT Start Time 1400  patient arrived 15 min late due to trees    OT Stop Time 1430   OT Time Calculation (min) 30 min   Activity Tolerance Patient tolerated treatment well   Behavior During Therapy [facility] for tasks assessed/performed      Past Medical History:  Diagnosis Date  . Biatrial enlargement 12/01/2015  . Chronic anticoagulation    coumadin, CHADS2VASC=6  . History of mitral valve replacement   . Hypertension   . Hypothyroidism   . Osteoporosis    on Boniva  . Permanent atrial fibrillation ([place])   . Pulmonary hypertension, moderate to severe ([place]) 12/01/2015  . Stroke [facility])    on Clopidrigel  . UTI (lower urinary tract infection) 07/2015   n&v; AKI  . Vascular dementia     Past Surgical History:  Procedure Laterality Date  . ABDOMINAL HYSTERECTOMY    . FRACTURE SURGERY  [id number]   Operative repair with intramedullary nail intratrochanteric Left Hip fracture  . INTRAMEDULLARY (IM) NAIL INTERTROCHANTERIC Left 12/01/2015   Procedure: INTRAMEDULLARY (IM) NAIL INTERTROCHANTRIC;  Surgeon: Carole Civil, MD;  Location: [facility];  Service: Orthopedics;  Laterality: Left;  . MITRAL VALVE REPLACEMENT      There were no vitals filed for this visit.      Subjective Assessment - 05/19/17 1450    Subjective  S:  I dont have any pain today. I can move it more.    Currently in Pain?  No/denies                      OT Treatments/Exercises (OP) - 05/19/17 0001      Exercises   Exercises Shoulder     Shoulder Exercises: Supine   Protraction PROM;5 reps;AAROM;15 reps   Horizontal ABduction PROM;5 reps;AAROM;15 reps   External Rotation PROM;5 reps;AAROM;15 reps   Internal Rotation PROM;5 reps;AAROM;15 reps   Flexion PROM;5 reps;AAROM;15 reps   ABduction PROM;5 reps;AAROM;15 reps     Manual Therapy   Manual Therapy Myofascial release   Manual therapy comments completed separately from therapeutic exercise   Myofascial Release Myofascial release to left upper arm, trapezius, and scapularis regions to decrease pain and fascial restrictions and increase range of motion                  OT Short Term Goals - 03/25/17 1458      OT SHORT TERM GOAL #1   Title Pt will understand and be independent with the HEP provided to facilitate her progress with using LUE in daily activities.   Time 4   Period Weeks   Status On-going     OT SHORT TERM GOAL #2   Title Patient will return to highest level of independence with all daily and leisure tasks using her LUE.   Time 4   Period  Weeks   Status On-going     OT SHORT TERM GOAL #3   Title Pt will have decreased fascial restrictions to trace amount in her left upper arm region to relieve tightness and tenderness to facilitate pain-free movements with ADLs.   Time 4   Period Weeks   Status On-going     OT SHORT TERM GOAL #4   Title Patient will increase A/ROM to [facility] to increase ability to complete dressing tasks with increased comfort level.   Time 4   Period Weeks   Status On-going     OT SHORT TERM GOAL #5   Title Patient will increase LUE strength to 4/5 to increase ability to get in and out of bed with more comfort.   Time 4   Period Weeks   Status On-going                  Plan - 05/19/17 1451    Clinical Impression Statement A: Patient will less restrictions in upper arm.   scapular region continues to have moderate fascial restrictions and tenderness.  abduction most limited with p/rom and aa/rom.     Plan P:  Attempt wall wash, resume missed exercises, focus on functional reaching.       Patient will benefit from skilled therapeutic intervention in order to improve the following deficits and impairments:  Decreased strength, Impaired flexibility, Decreased mobility, Decreased range of motion, Pain, Increased fascial restricitons, Impaired UE functional use  Visit Diagnosis: Pain in left upper arm  Other symptoms and signs involving the musculoskeletal system  Stiffness of left shoulder, not elsewhere classified    Problem List Patient Active Problem List   Diagnosis Date Noted  . T12 compression fracture ([place]) 04/22/2017  . Dementia, multi-infarct 04/22/2017  . Cardioembolic stroke ([place]) 41/74/0814  . B12 deficiency 04/22/2017  . Cough 12/16/2015  . Osteoporosis 12/05/2015  . Biatrial enlargement 12/01/2015  . Pulmonary hypertension, moderate to severe ([place]) 12/01/2015  . Closed fracture of intertrochanteric section of femur ([place])   . Chronic atrial fibrillation ([place]) 11/30/2015  . Hypothyroidism 11/29/2015  . Thrombocytopenia ([place]) 07/13/2015  . Chronic anticoagulation 07/13/2015  . H/O mitral valve replacement 07/13/2015  . Vascular dementia 07/12/2015  . Essential hypertension 07/12/2015    Vangie Bicker, [place], OTR/L [phone]  05/19/2017, 2:53 PM  [place] [address] [place], [place], [address] Phone: [phone]   Fax:  [phone]  Name: Jaala Bohle MRN: [id number] Date of Birth: [birth year]

## 2017-05-20 ENCOUNTER — Ambulatory Visit: Payer: Medicare Other | Admitting: Family Medicine

## 2017-05-20 ENCOUNTER — Encounter: Payer: Self-pay | Admitting: Family Medicine

## 2017-05-20 ENCOUNTER — Ambulatory Visit (INDEPENDENT_AMBULATORY_CARE_PROVIDER_SITE_OTHER): Payer: Medicare Other | Admitting: Family Medicine

## 2017-05-20 VITALS — BP 118/66 | HR 64 | Wt 121.0 lb

## 2017-05-20 DIAGNOSIS — Z7901 Long term (current) use of anticoagulants: Secondary | ICD-10-CM

## 2017-05-20 DIAGNOSIS — M8000XS Age-related osteoporosis with current pathological fracture, unspecified site, sequela: Secondary | ICD-10-CM | POA: Diagnosis not present

## 2017-05-20 DIAGNOSIS — I639 Cerebral infarction, unspecified: Secondary | ICD-10-CM

## 2017-05-20 DIAGNOSIS — D696 Thrombocytopenia, unspecified: Secondary | ICD-10-CM | POA: Diagnosis not present

## 2017-05-20 DIAGNOSIS — Z23 Encounter for immunization: Secondary | ICD-10-CM

## 2017-05-20 DIAGNOSIS — E538 Deficiency of other specified B group vitamins: Secondary | ICD-10-CM | POA: Diagnosis not present

## 2017-05-20 DIAGNOSIS — I482 Chronic atrial fibrillation, unspecified: Secondary | ICD-10-CM

## 2017-05-20 DIAGNOSIS — E039 Hypothyroidism, unspecified: Secondary | ICD-10-CM | POA: Diagnosis not present

## 2017-05-20 DIAGNOSIS — I10 Essential (primary) hypertension: Secondary | ICD-10-CM | POA: Diagnosis not present

## 2017-05-20 NOTE — Progress Notes (Signed)
Patient ID: Courtney [name], female    DOB: 1929/03/28, 83 y.o.   MRN: [id number]  Chief Complaint  Patient presents with  . Follow-up    Allergies Penicillins  Subjective:   Courtney [name] is a 83 y.o. female who presents to [facility] today.  HPI Here for follow up. Has been taking the coumadin 2.5 mg a day. No problems with compliance per son. Is due to get INR checked today. Is doing OT for arm. Eating well. Takes all medications each day. BP has been running well. Appetite is good. Sleeping well. Patient and son report that mood is good. No side effects with medication. Has not had any labs done other than PT/INR in over a year. Thyroid medication each day. No constipation. Is not on cholesterol medication b/c it has always been good.   Son reports that her memory is stable. Is doing well at home. Needs minimal help with ADLs, but he does help her with the stairs. No recent falls. Enjoys being active. No skin problems.   Thyroid Problem  Presents for follow-up visit. Patient reports no cold intolerance, constipation, depressed mood, diaphoresis, diarrhea, dry skin, fatigue, hair loss, heat intolerance, hoarse voice, leg swelling, palpitations, tremors, visual change, weight gain or weight loss. The symptoms have been stable. There is no history of heart failure.  Hypertension  This is a chronic problem. The current episode started more than 1 year ago. The problem has been resolved since onset. Pertinent negatives include no anxiety, blurred vision, chest pain, headaches, neck pain, palpitations or shortness of breath. There are no associated agents to hypertension. Risk factors for coronary artery disease include family history and post-menopausal state. Past treatments include lifestyle changes, ACE inhibitors, calcium channel blockers and beta blockers. The current treatment provides significant improvement. There are no compliance problems.  There is no history of angina, kidney  disease, CAD/MI, heart failure, PVD or retinopathy.    Past Medical History:  Diagnosis Date  . Biatrial enlargement 12/01/2015  . Chronic anticoagulation    coumadin, CHADS2VASC=6  . History of mitral valve replacement   . Hypertension   . Hypothyroidism   . Osteoporosis    on Boniva  . Permanent atrial fibrillation ([place])   . Pulmonary hypertension, moderate to severe ([place]) 12/01/2015  . Stroke [facility])    on Clopidrigel  . UTI (lower urinary tract infection) 07/2015   n&v; AKI  . Vascular dementia     Past Surgical History:  Procedure Laterality Date  . ABDOMINAL HYSTERECTOMY    . FRACTURE SURGERY  [id number]   Operative repair with intramedullary nail intratrochanteric Left Hip fracture  . INTRAMEDULLARY (IM) NAIL INTERTROCHANTERIC Left 12/01/2015   Procedure: INTRAMEDULLARY (IM) NAIL INTERTROCHANTRIC;  Surgeon: Carole Civil, MD;  Location: [facility];  Service: Orthopedics;  Laterality: Left;  . MITRAL VALVE REPLACEMENT      Family History  Problem Relation Age of Onset  . Heart disease Mother   . Heart disease Father   . Stroke Neg Hx   . Cancer Neg Hx   . Diabetes Neg Hx      Social History   Social History  . Marital status: Divorced    Spouse name: N/A  . Number of children: N/A  . Years of education: N/A   Occupational History  . Retired    Social History Main Topics  . Smoking status: Never Smoker  . Smokeless tobacco: Never Used  . Alcohol use No  . Drug use:  No  . Sexual activity: No   Other Topics Concern  . None   Social History Narrative   Lives with her ex-husband and son when in [place]. Has a house in [place], [place].    Review of Systems  Constitutional: Negative for activity change, appetite change, diaphoresis, fatigue, fever, weight gain and weight loss.  HENT: Negative for hoarse voice, nosebleeds and trouble swallowing.   Eyes: Negative for blurred vision and visual disturbance.  Respiratory: Negative for cough, chest tightness  and shortness of breath.   Cardiovascular: Negative for chest pain, palpitations and leg swelling.  Gastrointestinal: Negative for abdominal pain, blood in stool, constipation, diarrhea, nausea and vomiting.  Endocrine: Negative for cold intolerance, heat intolerance, polydipsia, polyphagia and polyuria.  Genitourinary: Negative for difficulty urinating, dysuria, frequency and urgency.  Musculoskeletal: Negative for joint swelling, myalgias and neck pain.  Skin: Negative for rash.  Neurological: Negative for dizziness, tremors, syncope, facial asymmetry, speech difficulty, weakness, light-headedness, numbness and headaches.  Hematological: Negative for adenopathy. Does not bruise/bleed easily.  Psychiatric/Behavioral: Negative for agitation, behavioral problems, dysphoric mood and sleep disturbance.     Objective:   BP 118/66   Pulse 64   Wt 121 lb (54.9 kg)   SpO2 93%   BMI 21.43 kg/m   Physical Exam  Constitutional: She is oriented to person, place, and time. She appears well-developed and well-nourished. No distress.  HENT:  Head: Normocephalic and atraumatic.  Nose: Nose normal.  Eyes: Pupils are equal, round, and reactive to light. EOM are normal.  Neck: Normal range of motion. Neck supple. No JVD present. No tracheal deviation present. No thyromegaly present.  Cardiovascular: Normal rate, regular rhythm and normal heart sounds.   Pulmonary/Chest: Effort normal and breath sounds normal. No stridor. No respiratory distress.  Abdominal: Soft. Bowel sounds are normal.  Musculoskeletal: She exhibits no edema.  Lymphadenopathy:    She has no cervical adenopathy.  Neurological: She is alert and oriented to person, place, and time. No cranial nerve deficit.  Skin: Skin is warm and dry.  Hemosiderin changes in lower legs bilaterally.   Psychiatric: She has a normal mood and affect. Her behavior is normal.  Nursing note and vitals reviewed.    Assessment and Plan   1. B12  deficiency, uncertain status.  Check labs. Used to get shots, but has not been taking other than daily MVI.  - Vitamin B12  2. Thrombocytopenia (HCC) Check labs, also history of anemia.  - CBC with Differential/Platelet  3. Hypothyroidism, unspecified type, chronic, on therapy for years.  Synthroid qd as directed. Counseled regarding how to take medication each day without MVI.  - TSH  4. Essential hypertension, stable.  Lifestyle modifications discussed with patient including a diet emphasizing vegetables, fruits, and whole grains. Limiting intake of sodium to less than 2,400 mg per day.  Recommendations discussed include consuming low-fat dairy products, poultry, fish, legumes, non-tropical vegetable oils, and nuts; and limiting intake of sweets, sugar-sweetened beverages, and red meat. Discussed following a plan such as the Dietary Approaches to Stop Hypertension (DASH) diet. Patient to read up on this diet.   - Basic metabolic panel  5. Cardioembolic stroke ([place]) Secondary to valve. Check PT/INR today. Had stroke due to valve and stroke secondary to bleed from supratherapeutic INR.   6. Osteoporosis with current pathological fracture, unspecified osteoporosis type, sequela Contiue the bisphosphonate for now, but not sure how many years she has been taking it. Son believes about 3-5 years. Will consider checking BMD.  Patient is a fall risk. Fall risk prevention discussed today with patient and son. Will bring in MVI/calcium and Vitamin D to follow up.   7. Chronic atrial fibrillation (HCC) Stable, rate controlled. Continue metoprolol.   8. Chronic anticoagulation Check labs. Have decided that they do not want to do the home health monitoring but would rather just come to the lab here and get labs as needed.  - INR/PT  9. Immunization due Done today. Will give the pneumovax 13 as needed. Flu shot administered last visit.  - Pneumococcal polysaccharide vaccine 23-valent greater than  or equal to 2yo subcutaneous/IM  Return in about 6 months (around 11/18/2017). Caren Macadam, MD 05/21/2017

## 2017-05-21 ENCOUNTER — Telehealth: Payer: Self-pay | Admitting: Family Medicine

## 2017-05-21 ENCOUNTER — Encounter (HOSPITAL_COMMUNITY): Payer: Medicare Other | Admitting: Occupational Therapy

## 2017-05-21 DIAGNOSIS — Z952 Presence of prosthetic heart valve: Secondary | ICD-10-CM

## 2017-05-21 LAB — BASIC METABOLIC PANEL
BUN/Creatinine Ratio: 25 (calc) — ABNORMAL HIGH (ref 6–22)
BUN: 25 mg/dL (ref 7–25)
CALCIUM: 10.2 mg/dL (ref 8.6–10.4)
CO2: 30 mmol/L (ref 20–32)
CREATININE: 0.99 mg/dL — AB (ref 0.60–0.88)
Chloride: 103 mmol/L (ref 98–110)
GLUCOSE: 86 mg/dL (ref 65–139)
Potassium: 4 mmol/L (ref 3.5–5.3)
Sodium: 140 mmol/L (ref 135–146)

## 2017-05-21 LAB — CBC WITH DIFFERENTIAL/PLATELET
Basophils Absolute: 22 cells/uL (ref 0–200)
Basophils Relative: 0.4 %
EOS PCT: 2.4 %
Eosinophils Absolute: 130 cells/uL (ref 15–500)
HEMATOCRIT: 39.9 % (ref 35.0–45.0)
Hemoglobin: 12.9 g/dL (ref 11.7–15.5)
LYMPHS ABS: 1199 {cells}/uL (ref 850–3900)
MCH: 27.4 pg (ref 27.0–33.0)
MCHC: 32.3 g/dL (ref 32.0–36.0)
MCV: 84.9 fL (ref 80.0–100.0)
MONOS PCT: 8.1 %
MPV: 10.8 fL (ref 7.5–12.5)
NEUTROS ABS: 3613 {cells}/uL (ref 1500–7800)
NEUTROS PCT: 66.9 %
Platelets: 203 10*3/uL (ref 140–400)
RBC: 4.7 10*6/uL (ref 3.80–5.10)
RDW: 13 % (ref 11.0–15.0)
Total Lymphocyte: 22.2 %
WBC mixed population: 437 cells/uL (ref 200–950)
WBC: 5.4 10*3/uL (ref 3.8–10.8)

## 2017-05-21 LAB — PROTIME-INR
INR: 2.3 — AB
Prothrombin Time: 23.8 s — ABNORMAL HIGH (ref 9.0–11.5)

## 2017-05-21 LAB — TSH: TSH: 0.16 m[IU]/L — AB (ref 0.40–4.50)

## 2017-05-21 LAB — VITAMIN B12: VITAMIN B 12: 525 pg/mL (ref 200–1100)

## 2017-05-21 MED ORDER — LEVOTHYROXINE SODIUM 100 MCG PO TABS: 100.0000 ug | ORAL_TABLET | Freq: Every day | ORAL | 0 refills | Status: DC

## 2017-05-21 NOTE — Telephone Encounter (Signed)
Called patient regarding message below. No answer, left generic message for patient to return call.   

## 2017-05-21 NOTE — Telephone Encounter (Signed)
Please call patients son and advise him that INR is 2.3. Continue at current dose and Return to lab in 2 weeks for recheck INR. Advise that the order is placed in lab. IN addition, thyroid is oversuppressed. Stop the levothyroxine 125 mcg and start levothyroxine 100 mcg a day. I have called it into the pharmacy. She will need to follow up in 3 months for visit and labs to recheck thyroid. Gwen Her. Mannie Stabile, MD

## 2017-05-23 ENCOUNTER — Ambulatory Visit (HOSPITAL_COMMUNITY): Payer: Medicare Other | Admitting: Occupational Therapy

## 2017-05-23 DIAGNOSIS — R29898 Other symptoms and signs involving the musculoskeletal system: Secondary | ICD-10-CM

## 2017-05-23 DIAGNOSIS — M79622 Pain in left upper arm: Secondary | ICD-10-CM | POA: Diagnosis not present

## 2017-05-23 DIAGNOSIS — M25612 Stiffness of left shoulder, not elsewhere classified: Secondary | ICD-10-CM

## 2017-05-23 NOTE — Therapy (Addendum)
[place], [place], [address] Phone: [phone]   Fax:  [phone]  Occupational Therapy Treatment and Discharge  Patient Details  Name: Courtney [name] MRN: [id number] Date of Birth: [birth year] Referring Provider: Sanjuana Kava, MD  Encounter Date: 05/23/2017      OT End of Session - 05/23/17 1620    Visit Number 14   Number of Visits 16   Date for OT Re-Evaluation 05/24/17   Authorization Type Medicare - copay $20   Authorization Time Period before 18th visit   Authorization - Visit Number 14   Authorization - Number of Visits 18   OT Start Time 1443  pt arrived late   OT Stop Time 1514   OT Time Calculation (min) 31 min   Activity Tolerance Patient tolerated treatment well   Behavior During Therapy [facility] for tasks assessed/performed      Past Medical History:  Diagnosis Date  . Biatrial enlargement 12/01/2015  . Chronic anticoagulation    coumadin, CHADS2VASC=6  . History of mitral valve replacement   . Hypertension   . Hypothyroidism   . Osteoporosis    on Boniva  . Permanent atrial fibrillation ([place])   . Pulmonary hypertension, moderate to severe ([place]) 12/01/2015  . Stroke [facility])    on Clopidrigel  . UTI (lower urinary tract infection) 07/2015   n&v; AKI  . Vascular dementia     Past Surgical History:  Procedure Laterality Date  . ABDOMINAL HYSTERECTOMY    . FRACTURE SURGERY  [id number]   Operative repair with intramedullary nail intratrochanteric Left Hip fracture  . INTRAMEDULLARY (IM) NAIL INTERTROCHANTERIC Left 12/01/2015   Procedure: INTRAMEDULLARY (IM) NAIL INTERTROCHANTRIC;  Surgeon: Carole Civil, MD;  Location: [facility];  Service: Orthopedics;  Laterality: Left;  . MITRAL VALVE REPLACEMENT      There were no vitals filed for this visit.      Subjective Assessment - 05/23/17 1614    Subjective  S: I'm feeling great.    Currently in Pain? No/denies            [facility] OT  Assessment - 05/23/17 1447      Assessment   Diagnosis Left humerus fx     Precautions   Precautions None     AROM   Overall AROM Comments Assessed seated. IR/er adducted.   Left Shoulder Flexion 98 Degrees  95 previous   Left Shoulder ABduction 90 Degrees  90 previous   Left Shoulder Internal Rotation 90 Degrees  same as previous   Left Shoulder External Rotation 56 Degrees  48 previous     PROM   Overall PROM Comments Assessed supine. IR/er adducted.   Left Shoulder Flexion 123 Degrees  140 previous   Left Shoulder ABduction 127 Degrees  120 previous   Left Shoulder Internal Rotation 90 Degrees  same as previous   Left Shoulder External Rotation 50 Degrees  58 previous     Strength   Overall Strength Comments Assessed seated. IR/er adducted.   Left Shoulder Flexion 4-/5  3/5 previous   Left Shoulder ABduction 3+/5  3/5 previous   Left Shoulder Internal Rotation 4/5  3/5 previous   Left Shoulder External Rotation 4-/5  3/5 previous                  OT Treatments/Exercises (OP) - 05/23/17 1500      Exercises   Exercises Shoulder     Shoulder Exercises: Supine   Protraction PROM;5  reps   Horizontal ABduction PROM;5 reps   External Rotation PROM;5 reps   Internal Rotation PROM;5 reps   Flexion PROM;5 reps   ABduction PROM;5 reps     Shoulder Exercises: Standing   Protraction Theraband;10 reps   Theraband Level (Shoulder Protraction) Level 2 (Red)   External Rotation Theraband;10 reps   Theraband Level (Shoulder External Rotation) Level 2 (Red)   Internal Rotation Theraband;10 reps   Theraband Level (Shoulder Internal Rotation) Level 2 (Red)   Flexion Theraband;10 reps   Theraband Level (Shoulder Flexion) Level 2 (Red)                OT Education - 2017-06-09 1614    Education provided Yes   Education Details red theraband strengthening   Person(s) Educated Patient;Child(ren)  son   Methods Explanation;Demonstration;Handout    Comprehension Verbalized understanding;Returned demonstration          OT Short Term Goals - 06/09/2017 1621      OT SHORT TERM GOAL #1   Title Pt will understand and be independent with the HEP provided to facilitate her progress with using LUE in daily activities.   Time 4   Period Weeks   Status Achieved     OT SHORT TERM GOAL #2   Title Patient will return to highest level of independence with all daily and leisure tasks using her LUE.   Time 4   Period Weeks   Status Achieved     OT SHORT TERM GOAL #3   Title Pt will have decreased fascial restrictions to trace amount in her left upper arm region to relieve tightness and tenderness to facilitate pain-free movements with ADLs.   Time 4   Period Weeks   Status Not Met     OT SHORT TERM GOAL #4   Title Patient will increase A/ROM to [facility] to increase ability to complete dressing tasks with increased comfort level.   Time 4   Period Weeks   Status Partially Met     OT SHORT TERM GOAL #5   Title Patient will increase LUE strength to 4/5 to increase ability to get in and out of bed with more comfort.   Time 4   Period Weeks   Status Partially Met                  Plan - Jun 09, 2017 1620    Clinical Impression Statement A: Reassessment completed this session, pt has met 2/5 goals and partially met 2/5 goals, making progress with strength and ROM. Pt is able to complete all daily tasks independently without pain. Pt's son reports she is completing all gym exercises with exception of overhead dumbell press. Pt is agreeable to discharge with HEP.    Plan P: Discharge pt      Patient will benefit from skilled therapeutic intervention in order to improve the following deficits and impairments:  Decreased strength, Impaired flexibility, Decreased mobility, Decreased range of motion, Pain, Increased fascial restricitons, Impaired UE functional use  Visit Diagnosis: Pain in left upper arm  Other symptoms and signs  involving the musculoskeletal system  Stiffness of left shoulder, not elsewhere classified      G-Codes - June 09, 2017 1623    Functional Assessment Tool Used (Outpatient only) FOTO score: 50/100 (50% impaired)   Functional Limitation Carrying, moving and handling objects   Carrying, Moving and Handling Objects Goal Status ([id number]) At least 20 percent but less than 40 percent impaired, limited or restricted   Carrying, Moving  and Handling Objects Discharge Status [phone]) At least 40 percent but less than 60 percent impaired, limited or restricted      Problem List Patient Active Problem List   Diagnosis Date Noted  . T12 compression fracture ([place]) 04/22/2017  . Dementia, multi-infarct 04/22/2017  . Cardioembolic stroke ([place]) 75/91/6384  . B12 deficiency 04/22/2017  . Cough 12/16/2015  . Osteoporosis 12/05/2015  . Biatrial enlargement 12/01/2015  . Pulmonary hypertension, moderate to severe ([place]) 12/01/2015  . Closed fracture of intertrochanteric section of femur ([place])   . Chronic atrial fibrillation ([place]) 11/30/2015  . Hypothyroidism 11/29/2015  . Thrombocytopenia ([place]) 07/13/2015  . Chronic anticoagulation 07/13/2015  . H/O mitral valve replacement 07/13/2015  . Vascular dementia, due to brain bleed from elevated INR, not due to ischemic strokes.  07/12/2015  . Essential hypertension 07/12/2015   Guadelupe Sabin, OTR/L  [phone] 05/23/2017, 5:20 PM  [place], [place], [address] Phone: [phone]   Fax:  [phone]  Name: Courtney [name] MRN: [id number] Date of Birth: [birth year]   OCCUPATIONAL THERAPY DISCHARGE SUMMARY  Visits from Start of Care: 14  Current functional level related to goals / functional outcomes: See above. Pt has made progress with strength, ROM, and functional use of LUE. Is now using LUE as non-dominant with all B/ADL tasks.    Remaining deficits: Continues to have pain limited ROM  greater than 50%   Education / Equipment: Pt educated on red theraband strengthening exercises.  Plan: Patient agrees to discharge.  Patient goals were partially met. Patient is being discharged due to being pleased with the current functional level.  ?????

## 2017-05-23 NOTE — Telephone Encounter (Signed)
Son informed.  Appt scheduled.

## 2017-05-23 NOTE — Patient Instructions (Signed)
Theraband strengthening: Complete 10-15X, 1-2X/day  1) Shoulder protraction  Anchor band in doorway, stand with back to door. Push your hand forward as much as you can to bringing your shoulder blades forward on your rib cage.     2) Shoulder flexion  While standing with back to the door, holding Theraband at hand level, raise arm in front of you.  Keep elbow straight through entire movement.        3) Shoulder Internal Rotation  While holding an elastic band at your side with your elbow bent, start with your hand away from your stomach, then pull the band towards your stomach. Keep your elbow near your side the entire time.     4) Shoulder External Rotation  While holding an elastic band at your side with your elbow bent, start with your hand near your stomach and then pull the band away. Keep your elbow at your side the entire time.

## 2017-05-29 ENCOUNTER — Encounter (HOSPITAL_COMMUNITY): Payer: Self-pay | Admitting: Emergency Medicine

## 2017-05-29 ENCOUNTER — Emergency Department (HOSPITAL_COMMUNITY): Payer: Medicare Other

## 2017-05-29 ENCOUNTER — Emergency Department (HOSPITAL_COMMUNITY)
Admission: EM | Admit: 2017-05-29 | Discharge: 2017-05-29 | Disposition: A | Discharge status: Home / Self Care | Payer: Medicare Other | Attending: Emergency Medicine | Admitting: Emergency Medicine

## 2017-05-29 DIAGNOSIS — W01198A Fall on same level from slipping, tripping and stumbling with subsequent striking against other object, initial encounter: Secondary | ICD-10-CM | POA: Diagnosis not present

## 2017-05-29 DIAGNOSIS — Y9389 Activity, other specified: Secondary | ICD-10-CM | POA: Insufficient documentation

## 2017-05-29 DIAGNOSIS — Z7901 Long term (current) use of anticoagulants: Secondary | ICD-10-CM | POA: Insufficient documentation

## 2017-05-29 DIAGNOSIS — E039 Hypothyroidism, unspecified: Secondary | ICD-10-CM | POA: Insufficient documentation

## 2017-05-29 DIAGNOSIS — S63502A Unspecified sprain of left wrist, initial encounter: Secondary | ICD-10-CM | POA: Diagnosis not present

## 2017-05-29 DIAGNOSIS — R51 Headache: Secondary | ICD-10-CM | POA: Diagnosis not present

## 2017-05-29 DIAGNOSIS — I10 Essential (primary) hypertension: Secondary | ICD-10-CM | POA: Diagnosis not present

## 2017-05-29 DIAGNOSIS — Z79899 Other long term (current) drug therapy: Secondary | ICD-10-CM | POA: Insufficient documentation

## 2017-05-29 DIAGNOSIS — Y999 Unspecified external cause status: Secondary | ICD-10-CM | POA: Diagnosis not present

## 2017-05-29 DIAGNOSIS — S01112A Laceration without foreign body of left eyelid and periocular area, initial encounter: Secondary | ICD-10-CM | POA: Insufficient documentation

## 2017-05-29 DIAGNOSIS — Y929 Unspecified place or not applicable: Secondary | ICD-10-CM | POA: Diagnosis not present

## 2017-05-29 DIAGNOSIS — S0993XA Unspecified injury of face, initial encounter: Secondary | ICD-10-CM | POA: Diagnosis present

## 2017-05-29 MED ORDER — HYDROCODONE-ACETAMINOPHEN 5-325 MG PO TABS
1.0000 | ORAL_TABLET | Freq: Once | ORAL | Status: AC | PRN
  Administered 2017-05-29: 1 via ORAL
  Filled 2017-05-29: qty 1

## 2017-05-29 MED ORDER — HYDROCODONE-ACETAMINOPHEN 5-325 MG PO TABS: 1.0000 | ORAL_TABLET | Freq: Once | ORAL | Status: DC

## 2017-05-29 NOTE — ED Notes (Addendum)
Report received, awaiting Rad/CTreport, lac repair and reeval

## 2017-05-29 NOTE — ED Notes (Signed)
Dr. James at bedside suturing 

## 2017-05-29 NOTE — ED Notes (Signed)
Pt assisted to sitting position and eased off of the stretcher with pt help  ambulated backwards several steps to wheelchair with nurse holding on to prevent fall

## 2017-05-29 NOTE — Discharge Instructions (Signed)
Keep Ace wrap around forehead laceration tonight. Left wrist Ace wrap until pain free, and bruising and swelling improving

## 2017-05-29 NOTE — ED Notes (Signed)
Ace wrap to L wrist as well as head wound per MD   Son is reluctant to take home as pt has dementia, cannot use a bedpan and he also cares for his father who is post stroke  Dr Jeneen Rinks in to discuss with son why pt cannot stay overnight

## 2017-05-29 NOTE — ED Provider Notes (Signed)
[facility] EMERGENCY DEPARTMENT Provider Note   CSN: [id number] Arrival date & time: 05/29/17  1654     History   Chief Complaint Chief Complaint  Patient presents with  . Fall    HPI Courtney [name] is a 83 y.o. female.CC:  Fall  HPI: 83 year old female. Is convalescing at home after a recent rehabilitation stay for a humerus fracture. She was walking across a parking lot to a restaurant today when she fell forward. Struck her head. Has laceration over left eye. Bruising to her face, bruising to her left shoulder and left wrist, painful left knee .  Pt is on plavix TIAs) and coumadin ([place] valve.)  Past Medical History:  Diagnosis Date  . Biatrial enlargement 12/01/2015  . Chronic anticoagulation    coumadin, CHADS2VASC=6  . History of mitral valve replacement   . Hypertension   . Hypothyroidism   . Osteoporosis    on Boniva  . Permanent atrial fibrillation ([place])   . Pulmonary hypertension, moderate to severe ([place]) 12/01/2015  . Stroke [facility])    on Clopidrigel  . UTI (lower urinary tract infection) 07/2015   n&v; AKI  . Vascular dementia     Patient Active Problem List   Diagnosis Date Noted  . T12 compression fracture ([place]) 04/22/2017  . Dementia, multi-infarct 04/22/2017  . Cardioembolic stroke ([place]) 25/42/7062  . B12 deficiency 04/22/2017  . Cough 12/16/2015  . Osteoporosis 12/05/2015  . Biatrial enlargement 12/01/2015  . Pulmonary hypertension, moderate to severe ([place]) 12/01/2015  . Closed fracture of intertrochanteric section of femur ([place])   . Chronic atrial fibrillation ([place]) 11/30/2015  . Hypothyroidism 11/29/2015  . Thrombocytopenia ([place]) 07/13/2015  . Chronic anticoagulation 07/13/2015  . H/O mitral valve replacement 07/13/2015  . Vascular dementia, due to brain bleed from elevated INR, not due to ischemic strokes.  07/12/2015  . Essential hypertension 07/12/2015    Past Surgical History:  Procedure Laterality Date  . ABDOMINAL HYSTERECTOMY      . FRACTURE SURGERY  [id number]   Operative repair with intramedullary nail intratrochanteric Left Hip fracture  . INTRAMEDULLARY (IM) NAIL INTERTROCHANTERIC Left 12/01/2015   Procedure: INTRAMEDULLARY (IM) NAIL INTERTROCHANTRIC;  Surgeon: Carole Civil, MD;  Location: [facility];  Service: Orthopedics;  Laterality: Left;  . MITRAL VALVE REPLACEMENT      OB History    No data available       Home Medications    Prior to Admission medications   Medication Sig Start Date End Date Taking? Authorizing Provider  acetaminophen (TYLENOL) 500 MG tablet Take 500 mg by mouth every 6 (six) hours as needed for mild pain.    [provider]  amLODipine (NORVASC) 2.5 MG tablet Take 2.5 mg by mouth daily.    [provider]  atenolol (TENORMIN) 25 MG tablet Take 12.5 mg by mouth daily.     [provider]  b complex vitamins tablet Take 1 tablet by mouth daily.    [provider]  clopidogrel (PLAVIX) 75 MG tablet Take 1 tablet (75 mg total) by mouth daily. Start 12/05/15 12/04/15   Rexene Alberts, MD  ibandronate (BONIVA) 150 MG tablet Take 150 mg by mouth every 30 (thirty) days. Take in the morning with a full glass of water, on an empty stomach, and do not take anything else by mouth or lie down for the next 30 min.    [provider]  levothyroxine (SYNTHROID) 100 MCG tablet Take 1 tablet (100 mcg total) by mouth daily  before breakfast. 05/21/17   Caren Macadam, MD  lisinopril (PRINIVIL,ZESTRIL) 2.5 MG tablet Take 2.5 mg by mouth daily.    [provider]  triamterene-hydrochlorothiazide (DYAZIDE) 37.5-25 MG capsule Take 1 capsule by mouth every other day.     [provider]  vitamin C (ASCORBIC ACID) 250 MG tablet Take 250 mg by mouth daily.     [provider]  VITAMIN D, ERGOCALCIFEROL, PO Take 250 mg by mouth daily.    [provider]  warfarin (COUMADIN) 5 MG tablet Take 1/2 tablet each evening by mouth as directed.  04/23/17   Caren Macadam, MD    Family History Family History  Problem Relation Age of Onset  . Heart disease Mother   . Heart disease Father   . Stroke Neg Hx   . Cancer Neg Hx   . Diabetes Neg Hx     Social History Social History  Substance Use Topics  . Smoking status: Never Smoker  . Smokeless tobacco: Never Used  . Alcohol use No     Allergies   Penicillins   Review of Systems Review of Systems  Constitutional: Negative for appetite change, chills, diaphoresis, fatigue and fever.  HENT: Negative for mouth sores, sore throat and trouble swallowing.        Facial bruising. Lt eyebrow laceration  Eyes: Negative for visual disturbance.  Respiratory: Negative for cough, chest tightness, shortness of breath and wheezing.   Cardiovascular: Negative for chest pain.  Gastrointestinal: Negative for abdominal distention, abdominal pain, diarrhea, nausea and vomiting.  Endocrine: Negative for polydipsia, polyphagia and polyuria.  Genitourinary: Negative for dysuria, frequency and hematuria.  Musculoskeletal: Positive for arthralgias and myalgias. Negative for gait problem.  Skin: Negative for color change, pallor and rash.  Neurological: Positive for headaches. Negative for dizziness, syncope and light-headedness.  Hematological: Does not bruise/bleed easily.  Psychiatric/Behavioral: Negative for behavioral problems and confusion.     Physical Exam Updated Vital Signs BP 128/66 (BP Location: Right Arm)   Pulse 67   Temp (!) 97.5 F (36.4 C) (Oral)   Resp 16   Ht 5\' 3"  (1.6 m)   Wt 54.9 kg (121 lb)   SpO2 96%   BMI 21.43 kg/m   Physical Exam  Constitutional: She is oriented to person, place, and time. She appears well-developed and well-nourished. No distress.  HENT:  Head: Normocephalic.  Left maxilla contusion. Left eyebrow laceration 2 cm  Eyes: Pupils are equal, round, and reactive to light. Conjunctivae are normal. No scleral icterus.  Neck: Normal range  of motion. Neck supple. No thyromegaly present.  Cardiovascular: Normal rate and regular rhythm.  Exam reveals no gallop and no friction rub.   No murmur heard. Pulmonary/Chest: Effort normal and breath sounds normal. No respiratory distress. She has no wheezes. She has no rales.  Abdominal: Soft. Bowel sounds are normal. She exhibits no distension. There is no tenderness. There is no rebound.  Musculoskeletal: Normal range of motion.  Some ecchymosis to the left shoulder. No deformity or soft tissue swelling. Bruising and ecchymosis and tenderness to the left distal wrist. No deformity.. Lt knee tenderness at patella. Extensor mechanism intact  Neurological: She is alert and oriented to person, place, and time.  Skin: Skin is warm and dry. No rash noted.  Psychiatric: She has a normal mood and affect. Her behavior is normal.     ED Treatments / Results  Labs (all labs ordered are listed, but only abnormal results are displayed) Labs Reviewed -  No data to display  EKG  EKG Interpretation None       Radiology Dg Chest 1 View  Result Date: 05/29/2017 CLINICAL DATA:  Status post fall with left rib pain. EXAM: CHEST 1 VIEW COMPARISON:  November 29, 2015 FINDINGS: The heart size and mediastinal contours are stable. The heart size is enlarged. Cardiac valvular replacement ring is unchanged. There is no focal infiltrate, pulmonary edema, or pleural effusion. The visualized skeletal structures are stable. IMPRESSION: No active cardiopulmonary disease. No definite fracture or dislocation is identified in the visualized ribs. Electronically Signed   By: Abelardo Diesel M.D.   On: 05/29/2017 18:42   Dg Wrist Complete Left  Result Date: 05/29/2017 CLINICAL DATA:  Status post fall with left wrist pain. EXAM: LEFT WRIST - COMPLETE 3+ VIEW COMPARISON:  None. FINDINGS: There is no evidence of fracture or dislocation. There are osteoarthritic changes of the carpal bones, ulna carpal joint, distal  interphalangeal joints, first carpal metacarpal joints. Soft tissue swelling is identified over the medial left distal forearm. IMPRESSION: No acute fracture identified. Soft tissue swelling of the distal left forearm. Electronically Signed   By: Abelardo Diesel M.D.   On: 05/29/2017 18:45   Ct Head Wo Contrast  Result Date: 05/29/2017 CLINICAL DATA:  Patient fell in parking lot of [employer]. Patient fell face first and has scrapes and bruising on left check and forehead. Bruising and swelling on left wrist. Patient complains of rib pain to left flank with worsening pain while taking a deep breath EXAM: CT HEAD WITHOUT CONTRAST CT MAXILLOFACIAL WITHOUT CONTRAST TECHNIQUE: Multidetector CT imaging of the head and maxillofacial structures were performed using the standard protocol without intravenous contrast. Multiplanar CT image reconstructions of the maxillofacial structures were also generated. COMPARISON:  None. FINDINGS: CT HEAD FINDINGS Brain: No intracranial hemorrhage. No parenchymal contusion. No midline shift or mass effect. Basilar cisterns are patent. No skull base fracture. No fluid in the paranasal sinuses or mastoid air cells. Orbits are normal. There are periventricular and subcortical white matter hypodensities. Generalized cortical atrophy. Vascular: No hyperdense vessel or unexpected calcification. Skull: Normal. Negative for fracture or focal lesion. Sinuses/Orbits: Paranasal sinuses and mastoid air cells are clear. Orbits are clear. Other: None. CT MAXILLOFACIAL FINDINGS Osseous: orbital walls are intact. Zygomatic arches are intact. There is mild fragmentation of the nasal bone which is likely chronic. Maxillary sinus walls are intact. Pterygoid plates are normal. Mandibular condyles are located.  No evidence mandible fracture. Orbits: Globes are intact. Intraconal contents are normal. No proptosis. Sinuses: No fluid in the paranasal sinuses suggest trauma. Soft tissues: Negative  IMPRESSION: 1. No intracranial trauma. 2. Atrophy and white matter microvascular disease. 3. No acute facial bone fracture. Electronically Signed   By: Suzy Bouchard M.D.   On: 05/29/2017 18:52   Dg Knee Complete 4 Views Left  Result Date: 05/29/2017 CLINICAL DATA:  Status post fall with left knee pain. EXAM: LEFT KNEE - COMPLETE 4+ VIEW COMPARISON:  None. FINDINGS: No evidence of fracture, dislocation, or joint effusion. There are decreased femoral tibial joint space. Chondrocalcinosis is identified. Soft tissues are unremarkable. IMPRESSION: No acute abnormality identified. Electronically Signed   By: Abelardo Diesel M.D.   On: 05/29/2017 18:47   Dg Humerus Left  Result Date: 05/29/2017 CLINICAL DATA:  Status post fall with left arm pain. EXAM: LEFT HUMERUS - 2+ VIEW COMPARISON:  Left shoulder films April 24, 2017 FINDINGS: There is no evidence of fracture or dislocation. There is chronic deformity  of the left proximal humerus unchanged compared to prior x-ray. IMPRESSION: No acute fracture or dislocation. Chronic deformity of the left proximal humerus unchanged compared prior film of April 24, 2017. Electronically Signed   By: Abelardo Diesel M.D.   On: 05/29/2017 18:49   Dg Hand Complete Left  Result Date: 05/29/2017 CLINICAL DATA:  Status post fall with left hand pain. EXAM: LEFT HAND - COMPLETE 3+ VIEW COMPARISON:  None. FINDINGS: There is no evidence of fracture or dislocation. There are arthritic changes of the distal interphalangeal joints, the first metacarpal carpal joint, the ulna carpal joint. There is soft tissue swelling in the medial distal forearm. IMPRESSION: No acute fracture or dislocation identified. Electronically Signed   By: Abelardo Diesel M.D.   On: 05/29/2017 18:46   Ct Maxillofacial Wo Contrast  Result Date: 05/29/2017 CLINICAL DATA:  Patient fell in parking lot of [employer]. Patient fell face first and has scrapes and bruising on left check and forehead.  Bruising and swelling on left wrist. Patient complains of rib pain to left flank with worsening pain while taking a deep breath EXAM: CT HEAD WITHOUT CONTRAST CT MAXILLOFACIAL WITHOUT CONTRAST TECHNIQUE: Multidetector CT imaging of the head and maxillofacial structures were performed using the standard protocol without intravenous contrast. Multiplanar CT image reconstructions of the maxillofacial structures were also generated. COMPARISON:  None. FINDINGS: CT HEAD FINDINGS Brain: No intracranial hemorrhage. No parenchymal contusion. No midline shift or mass effect. Basilar cisterns are patent. No skull base fracture. No fluid in the paranasal sinuses or mastoid air cells. Orbits are normal. There are periventricular and subcortical white matter hypodensities. Generalized cortical atrophy. Vascular: No hyperdense vessel or unexpected calcification. Skull: Normal. Negative for fracture or focal lesion. Sinuses/Orbits: Paranasal sinuses and mastoid air cells are clear. Orbits are clear. Other: None. CT MAXILLOFACIAL FINDINGS Osseous: orbital walls are intact. Zygomatic arches are intact. There is mild fragmentation of the nasal bone which is likely chronic. Maxillary sinus walls are intact. Pterygoid plates are normal. Mandibular condyles are located.  No evidence mandible fracture. Orbits: Globes are intact. Intraconal contents are normal. No proptosis. Sinuses: No fluid in the paranasal sinuses suggest trauma. Soft tissues: Negative IMPRESSION: 1. No intracranial trauma. 2. Atrophy and white matter microvascular disease. 3. No acute facial bone fracture. Electronically Signed   By: Suzy Bouchard M.D.   On: 05/29/2017 18:52    Procedures Procedures (including critical care time)  Medications Ordered in ED Medications  HYDROcodone-acetaminophen (NORCO/VICODIN) 5-325 MG per tablet 1 tablet (1 tablet Oral Given 05/29/17 1746)     Initial Impression / Assessment and Plan / ED Course  I have reviewed the  triage vital signs and the nursing notes.  Pertinent labs & imaging results that were available during my care of the patient were reviewed by me and considered in my medical decision making (see chart for details).     No acute abdomen is noted on x-rays. Stable position of left humeral fracture fragment.  LACERATION REPAIR Performed by: Lolita Patella Authorized by: Lolita Patella Consent: Verbal consent obtained. Risks and benefits: risks, benefits and alternatives were discussed Consent given by: patient Patient identity confirmed: provided demographic data Prepped and Draped in normal sterile fashion Wound explored  Laceration Location: lt eyebrow  Laceration Length: 2cm  No Foreign Bodies seen or palpated  Anesthesia: local infiltration  Local anesthetic: lidocaine 1% c epinephrine  Anesthetic total: 3 ml  Irrigation method: syringe Amount of cleaning: standard  Skin closure: 5-0  nylon,   Number of sutures: 5  Technique: simple  Patient tolerance: Patient tolerated the procedure well with no immediate complications.   Final Clinical Impressions(s) / ED Diagnoses   Final diagnoses:  Laceration of left eyebrow, initial encounter  Sprain of left wrist, initial encounter    New Prescriptions Discharge Medication List as of 05/29/2017  7:33 PM       Tanna Furry, MD 05/29/17 2053

## 2017-05-29 NOTE — ED Triage Notes (Signed)
Patient fell in parking lot of [employer]. Patient fell face first and has scrapes and bruising on left check and forehead. Bruising and swelling on left wrist. Patient complains of rib pain to left flank with worsening pain while taking a deep breath.

## 2017-06-09 ENCOUNTER — Encounter: Payer: Self-pay | Admitting: Orthopedic Surgery

## 2017-06-09 ENCOUNTER — Ambulatory Visit: Payer: Medicare Other | Admitting: Orthopedic Surgery

## 2017-06-09 ENCOUNTER — Ambulatory Visit (INDEPENDENT_AMBULATORY_CARE_PROVIDER_SITE_OTHER): Payer: Medicare Other

## 2017-06-09 VITALS — BP 116/71 | HR 70 | Ht 63.0 in

## 2017-06-09 DIAGNOSIS — M25552 Pain in left hip: Secondary | ICD-10-CM

## 2017-06-09 DIAGNOSIS — M25562 Pain in left knee: Secondary | ICD-10-CM

## 2017-06-09 NOTE — Progress Notes (Signed)
Progress Note   Patient ID: Courtney [name], female   DOB: 09-04-1928, 83 y.o.   MRN: [id number]  Chief Complaint  Patient presents with  . Hip Pain    left  . Knee Pain    left    83 year old female fell on October 25.  She was seen in the emergency room and the workup included evaluation of her left knee which was negative except for arthritis of the knee  She comes in complaining of but not ambulating well since that time.  Her son who is a therapist by [employer] does note that she has not been able to walk and is dragging her left leg complaining of left leg pain which includes the knee and hip.  THE PAIN IS MILD DULL CONSTANT AND MAINLY IN THE  LEFT KNEE      Review of Systems  Musculoskeletal: Positive for falls.  Skin:       Ecchymosis in the facial region and left upper extremity  Neurological: Negative for tingling and sensory change.   No outpatient medications have been marked as taking for the 06/09/17 encounter (Office Visit) with Carole Civil, MD.    Past Medical History:  Diagnosis Date  . Biatrial enlargement 12/01/2015  . Chronic anticoagulation    coumadin, CHADS2VASC=6  . History of mitral valve replacement   . Hypertension   . Hypothyroidism   . Osteoporosis    on Boniva  . Permanent atrial fibrillation ([place])   . Pulmonary hypertension, moderate to severe ([place]) 12/01/2015  . Stroke [facility])    on Clopidrigel  . UTI (lower urinary tract infection) 07/2015   n&v; AKI  . Vascular dementia      Allergies  Allergen Reactions  . Penicillins Swelling    Has patient had a PCN reaction causing immediate rash, facial/tongue/throat swelling, SOB or lightheadedness with hypotension: Yes Has patient had a PCN reaction causing severe rash involving mucus membranes or skin necrosis: No Has patient had a PCN reaction that required hospitalization No Has patient had a PCN reaction occurring within the last 10 years: No If all of the above answers are "NO", then may  proceed with Cephalosporin use. Whole body "swelled up" and had to go to the hospital    BP 116/71   Pulse 70   Ht 5\' 3"  (1.6 m)   BMI 21.43 kg/m    Physical Exam Gen. appearance the patient's appearance is normal with normal grooming and  hygiene The patient is oriented to person not place and time  mood and affect are normal   Ortho Exam  Gait has been as stated in the history only able to stand and drag her left leg Her left hip range of motion was normal there is no tenderness there was stable muscle tone was normal in the left thigh neurovascular exam is normal in the left leg  The left knee was tender with decreased range of motion primarily tenderness along the medial joint mild effusion no instability  Right lower extremity range of motion stability and strength normal  My interpretation of the left knee x-ray is left knee medial joint line tenderness mild osteopenia no fracture  Left hand and wrist CMC joint arthritis in the osteopenia calcifications in the ulnar side of the wrist mild arthritis in the radiocarpal joint  Left humerus proximal humerus old fracture no acute fracture subluxation dislocation  Today's x-ray of the hip AP lateral left hip no fracture of the left hemipelvis is seen.  She does have an old fracture in her trunk treated with gamma nail with no complications   Medical decision-making Encounter Diagnoses  Name Primary?  . Left hip pain Yes  . Acute pain of left knee    I WOULD TREAT THE LEFT KNEE PAIN WITH A SUPPORTIVE BRACE AND LET HER WEIGHT BEAR AS TOLERATED   FU IN 4 WEEKS   Arther Abbott, MD 06/09/2017 12:04 PM

## 2017-06-13 ENCOUNTER — Other Ambulatory Visit: Payer: Self-pay | Admitting: Family Medicine

## 2017-06-13 MED ORDER — WARFARIN SODIUM 3 MG PO TABS: 3.0000 mg | ORAL_TABLET | Freq: Every day | ORAL | 1 refills | Status: DC

## 2017-06-13 NOTE — Telephone Encounter (Signed)
Please call and confirm that patient has been taking her coumadin as directed each day, even during all this issues with her fall.  Confirm dosing with them.  Her INR is low at 1.5.  Is she taking coumadin 5 mg, 1/2 tablet po qd?  If she is taking her correct dose daily as directed, she needs to increase her coumadin 3mg  a day, four days a week and coumadin 2.5 mg a day, 3 days a week.  Please go over this with her son several times and have him repeat instructions back to you.   Gwen Her. Mannie Stabile, MD

## 2017-06-13 NOTE — Telephone Encounter (Signed)
Patient informed of message below, verbalized understanding.  

## 2017-06-30 ENCOUNTER — Other Ambulatory Visit: Payer: Self-pay | Admitting: Family Medicine

## 2017-07-07 ENCOUNTER — Ambulatory Visit: Payer: Medicare Other | Admitting: Orthopedic Surgery

## 2017-07-07 ENCOUNTER — Other Ambulatory Visit: Payer: Self-pay | Admitting: Family Medicine

## 2017-07-09 ENCOUNTER — Telehealth: Payer: Self-pay | Admitting: Family Medicine

## 2017-07-09 NOTE — Telephone Encounter (Signed)
Courtney [name] from [facility] [phone]  Referral to [facility] in Sept. Needs some questions answered.

## 2017-07-10 ENCOUNTER — Telehealth: Payer: Self-pay | Admitting: Radiology

## 2017-07-10 NOTE — Telephone Encounter (Signed)
Left Ronalee Belts a message to return call.

## 2017-07-10 NOTE — Telephone Encounter (Signed)
Son called to cancelled appt for mother for Monday in case of bad weather, did not want to RS states she is well, and will let us know if her pain returns.

## 2017-07-14 ENCOUNTER — Ambulatory Visit: Payer: Medicare Other | Admitting: Orthopedic Surgery

## 2017-08-05 ENCOUNTER — Other Ambulatory Visit: Payer: Self-pay | Admitting: Family Medicine

## 2017-08-07 ENCOUNTER — Telehealth: Payer: Self-pay | Admitting: Family Medicine

## 2017-08-07 MED ORDER — LISINOPRIL 2.5 MG PO TABS: 2.5000 mg | ORAL_TABLET | Freq: Every day | ORAL | 0 refills | Status: DC

## 2017-08-07 MED ORDER — CLOPIDOGREL BISULFATE 75 MG PO TABS: 75.0000 mg | ORAL_TABLET | Freq: Every day | ORAL | Status: DC

## 2017-08-07 MED ORDER — AMLODIPINE BESYLATE 2.5 MG PO TABS: 2.5000 mg | ORAL_TABLET | Freq: Every day | ORAL | 0 refills | Status: DC

## 2017-08-07 MED ORDER — TRIAMTERENE-HCTZ 37.5-25 MG PO CAPS: 1.0000 | ORAL_CAPSULE | ORAL | 0 refills | Status: DC

## 2017-08-07 NOTE — Telephone Encounter (Signed)
Pt is out of some of her medicine, please send RX to [facility].

## 2017-08-07 NOTE — Telephone Encounter (Signed)
Spoke to son, refilled requested med

## 2017-08-11 ENCOUNTER — Telehealth: Payer: Self-pay | Admitting: Family Medicine

## 2017-08-11 MED ORDER — LISINOPRIL 2.5 MG PO TABS: 2.5000 mg | ORAL_TABLET | Freq: Every day | ORAL | 5 refills | Status: DC

## 2017-08-11 MED ORDER — CLOPIDOGREL BISULFATE 75 MG PO TABS: 75.0000 mg | ORAL_TABLET | Freq: Every day | ORAL | 2 refills | Status: DC

## 2017-08-11 MED ORDER — AMLODIPINE BESYLATE 2.5 MG PO TABS: 2.5000 mg | ORAL_TABLET | Freq: Every day | ORAL | 5 refills | Status: DC

## 2017-08-11 MED ORDER — TRIAMTERENE-HCTZ 37.5-25 MG PO CAPS: 1.0000 | ORAL_CAPSULE | ORAL | 2 refills | Status: DC

## 2017-08-11 MED ORDER — CLOPIDOGREL BISULFATE 75 MG PO TABS: 75.0000 mg | ORAL_TABLET | Freq: Every day | ORAL | Status: DC

## 2017-08-11 MED ORDER — LEVOTHYROXINE SODIUM 100 MCG PO TABS: 100.0000 ug | ORAL_TABLET | Freq: Every day | ORAL | 1 refills | Status: DC

## 2017-08-11 NOTE — Telephone Encounter (Signed)
Patients son called to check the status of her plavix  Pharmacy is [facility] in [place]  Cb#: [phone]  Also wants to know when patients comes back for her next I AND R Requests that you leave a msg if no one answers

## 2017-08-11 NOTE — Telephone Encounter (Signed)
Pt needs to have Plavix refilled

## 2017-08-11 NOTE — Telephone Encounter (Signed)
Informed son that plavix was sent- he states they did not have it when he picked up other medications, so I resent it. I also informed that lab orders were in place for next inr.

## 2017-08-12 LAB — PROTIME-INR
INR: 2.2 — ABNORMAL HIGH
Prothrombin Time: 23.1 s — ABNORMAL HIGH (ref 9.0–11.5)

## 2017-08-12 NOTE — Telephone Encounter (Signed)
Med was sent 1/3 and 1/7. Called pharmacy, gave verbal since they had not received.

## 2017-08-13 ENCOUNTER — Telehealth: Payer: Self-pay | Admitting: Family Medicine

## 2017-08-13 DIAGNOSIS — I639 Cerebral infarction, unspecified: Secondary | ICD-10-CM

## 2017-08-13 DIAGNOSIS — I482 Chronic atrial fibrillation, unspecified: Secondary | ICD-10-CM

## 2017-08-13 NOTE — Telephone Encounter (Signed)
Standing order for PT/INR placed for patient per Dr. Mannie Stabile.

## 2017-08-19 ENCOUNTER — Ambulatory Visit: Payer: Medicare Other | Admitting: Family Medicine

## 2017-09-01 ENCOUNTER — Ambulatory Visit: Payer: Medicare Other | Admitting: Family Medicine

## 2017-09-01 ENCOUNTER — Other Ambulatory Visit: Payer: Self-pay

## 2017-09-01 ENCOUNTER — Ambulatory Visit (HOSPITAL_COMMUNITY)
Admission: RE | Admit: 2017-09-01 | Discharge: 2017-09-01 | Disposition: A | Discharge status: Home / Self Care | Payer: Medicare Other | Source: Ambulatory Visit | Attending: Family Medicine | Admitting: Family Medicine

## 2017-09-01 ENCOUNTER — Encounter: Payer: Self-pay | Admitting: Family Medicine

## 2017-09-01 VITALS — BP 120/66 | HR 67 | Temp 98.9°F | Resp 16

## 2017-09-01 DIAGNOSIS — Z7901 Long term (current) use of anticoagulants: Secondary | ICD-10-CM

## 2017-09-01 DIAGNOSIS — M85872 Other specified disorders of bone density and structure, left ankle and foot: Secondary | ICD-10-CM | POA: Insufficient documentation

## 2017-09-01 DIAGNOSIS — W19XXXA Unspecified fall, initial encounter: Secondary | ICD-10-CM | POA: Diagnosis not present

## 2017-09-01 DIAGNOSIS — I482 Chronic atrial fibrillation, unspecified: Secondary | ICD-10-CM

## 2017-09-01 DIAGNOSIS — I10 Essential (primary) hypertension: Secondary | ICD-10-CM

## 2017-09-01 DIAGNOSIS — M79672 Pain in left foot: Secondary | ICD-10-CM | POA: Diagnosis present

## 2017-09-01 DIAGNOSIS — E039 Hypothyroidism, unspecified: Secondary | ICD-10-CM | POA: Diagnosis not present

## 2017-09-01 DIAGNOSIS — S92355A Nondisplaced fracture of fifth metatarsal bone, left foot, initial encounter for closed fracture: Secondary | ICD-10-CM | POA: Diagnosis not present

## 2017-09-01 MED ORDER — AMLODIPINE BESYLATE 2.5 MG PO TABS
2.5000 mg | ORAL_TABLET | Freq: Every day | ORAL | 3 refills | Status: DC
Start: 2017-09-01 — End: 2018-02-10

## 2017-09-01 NOTE — Progress Notes (Signed)
Patient ID: Courtney [name], female    DOB: 1928-09-08, 84 y.o.   MRN: [id number]  Chief Complaint  Patient presents with  . Hypothyroidism  . Fall    Allergies Penicillins  Subjective:   Courtney [name] is a 84 y.o. female who presents to [facility] today.  HPI Presents with son today. Reports that last night their caregiver had arrived and son went down to let caregiver in the house. At that time, got up out of chair and started to walk and fell in the room on a wood floor. Landed on left side and twisted ankle when fell. Did not hit head. Son heard her fall and came up and found her on the floor. She was trying to get up and son picked her up off the floor. She has been using the walker since the fall. Uses a cane when out of the home and is supposed to use the walker at home.  Son reports the patient has been her normal self since the fall other than the fact that it hurts a bit to stand on her left foot.  Patient does report that where she landed on her left side her left breast is sore.  She reports that she can breathe okay but it does hurt to take a very deep breath.  She denies any chest pain.  No shortness of breath.  Eating and drinking well.  Has not been taking any medication for pain.  Is also here today to follow-up for her thyroid.  At her last visit several months ago her dose was decreased due to a low TSH.  She reports she has been feeling fine.  Energy level is good.  Appetite is good.  Bowel movements are normal.  Is also due today to get a check on her Coumadin.  Has been taking her Coumadin as directed.  Is not missing doses.  Son reports that she did miss 2 doses over the past several weeks.  No bleeding.  Denies any blood in stool.  No hematuria.  Son wonders if she can stop 1 of her blood pressure medicine.  He reports that sometimes when they check her blood pressure it is on the low side.  Reports that at times the blood pressure has been around 601 systolic.   Patient denies any dizziness.  Denies any numbness or tingling in her extremities.  No lightheadedness.  Patient has not had any syncopal episodes.  She reports that when she fell it was not because she felt dizzy but rather she tripped.     Fall  The accident occurred 1 to 3 hours ago. The fall occurred while standing. She fell from a height of 3 to 5 ft. There was no blood loss. The point of impact was the left foot. The pain is present in the left foot. The pain is at a severity of 5/10. The pain is mild. The symptoms are aggravated by movement, pressure on injury and standing. Pertinent negatives include no abdominal pain, bowel incontinence, fever, headaches, nausea or vomiting. She has tried rest for the symptoms. The treatment provided no relief.    Past Medical History:  Diagnosis Date  . Biatrial enlargement 12/01/2015  . Chronic anticoagulation    coumadin, CHADS2VASC=6  . History of mitral valve replacement   . Hypertension   . Hypothyroidism   . Osteoporosis    on Boniva  . Permanent atrial fibrillation ([place])   . Pulmonary hypertension, moderate to severe ([place])  12/01/2015  . Stroke [facility])    on Clopidrigel  . UTI (lower urinary tract infection) 07/2015   n&v; AKI  . Vascular dementia     Past Surgical History:  Procedure Laterality Date  . ABDOMINAL HYSTERECTOMY    . FRACTURE SURGERY  [id number]   Operative repair with intramedullary nail intratrochanteric Left Hip fracture  . INTRAMEDULLARY (IM) NAIL INTERTROCHANTERIC Left 12/01/2015   Procedure: INTRAMEDULLARY (IM) NAIL INTERTROCHANTRIC;  Surgeon: Carole Civil, MD;  Location: [facility];  Service: Orthopedics;  Laterality: Left;  . MITRAL VALVE REPLACEMENT      Family History  Problem Relation Age of Onset  . Heart disease Mother   . Heart disease Father   . Stroke Neg Hx   . Cancer Neg Hx   . Diabetes Neg Hx      Social History   Socioeconomic History  . Marital status: Divorced    Spouse name: None  .  Number of children: None  . Years of education: None  . Highest education level: None  Social Needs  . Financial resource strain: None  . Food insecurity - worry: None  . Food insecurity - inability: None  . Transportation needs - medical: None  . Transportation needs - non-medical: None  Occupational History  . Occupation: Retired  Tobacco Use  . Smoking status: Never Smoker  . Smokeless tobacco: Never Used  Substance and Sexual Activity  . Alcohol use: No  . Drug use: No  . Sexual activity: No  Other Topics Concern  . None  Social History Narrative   Lives with her ex-husband and son when in [place]. Has a house in [place], [place].    Review of Systems  Constitutional: Negative for activity change, appetite change and fever.  HENT: Negative for mouth sores and trouble swallowing.   Eyes: Negative for visual disturbance.  Respiratory: Negative for cough, chest tightness and shortness of breath.   Cardiovascular: Negative for chest pain, palpitations and leg swelling.  Gastrointestinal: Negative for abdominal pain, bowel incontinence, nausea and vomiting.  Genitourinary: Negative for dysuria, frequency and urgency.  Musculoskeletal: Positive for arthralgias and gait problem. Negative for myalgias, neck pain and neck stiffness.  Skin: Negative for color change, rash and wound.  Neurological: Negative for dizziness, syncope, light-headedness and headaches.  Hematological: Negative for adenopathy.  Psychiatric/Behavioral: Negative for decreased concentration, dysphoric mood and hallucinations. The patient is not hyperactive.      Objective:   BP 120/66 (BP Location: Left Arm, Patient Position: Sitting, Cuff Size: Normal)   Pulse 67   Temp 98.9 F (37.2 C)   Resp 16   SpO2 97%   Physical Exam  Constitutional: She is oriented to person, place, and time. She appears well-developed and well-nourished.  HENT:  Head: Normocephalic and atraumatic.  Eyes: EOM are normal.  Pupils are equal, round, and reactive to light.  Neck: Normal range of motion. Neck supple. No JVD present. No tracheal deviation present. No thyromegaly present.  Cardiovascular: Normal rate, regular rhythm and normal heart sounds.  Pulmonary/Chest: Effort normal and breath sounds normal. She has no wheezes. She has no rales.  Left breast at approximate 6 o'clock position with 3 x 2 cm ecchymotic area, tender to palpation.  No chest wall tenderness to palpation with anterior posterior palpation of chest wall or lateral squeeze.  Musculoskeletal:       Left ankle: She exhibits decreased range of motion, swelling and ecchymosis. She exhibits no laceration. No tenderness. No lateral malleolus and  no medial malleolus tenderness found.       Feet:  Left forefoot tenderness to palpation with associated bruising.  Toes bilaterally with bluish discoloration, son and patient reports that toes have looked this way for years.  1+ palpable DP pulses bilaterally.  No tenderness to palpation over medial or lateral malleolus.  No Achilles tenderness to palpation.  Patient able to dorsiflex foot without trouble or pain.  Patient with decreased range of plantar flexion.  However son and patient report this is no change from her previous level of function.  Sensation intact in feet bilaterally.  No cutaneous temperature difference bilaterally in feet.  Toes are cooler than forefoot bilaterally.  Lymphadenopathy:    She has no cervical adenopathy.  Neurological: She is alert and oriented to person, place, and time. No cranial nerve deficit.  Skin: Skin is warm and dry.  Psychiatric: She has a normal mood and affect. Her behavior is normal.  Vitals reviewed.    Assessment and Plan   1. Essential hypertension Refill blood pressure medicine at this time.  We will discontinue lisinopril 2.5 mg p.o. daily.  Plan to recheck blood pressure in 1-2 months.  Son will call with blood pressure checks from home. -  amLODipine (NORVASC) 2.5 MG tablet; Take 1 tablet (2.5 mg total) by mouth daily.  Dispense: 90 tablet; Refill: 3 - Basic metabolic panel  2. Chronic atrial fibrillation (HCC) Patient rate controlled at this time.  Continue beta-blocker and calcium channel blocker.  Continue anticoagulation.  3. Hypothyroidism, unspecified type Check thyroid function today and adjust as needed. - TSH  4. Chronic anticoagulation Check labs. - INR/PT; Standing  5. Foot pain, left Status post fall with possible fracture.  Check x-ray at this time.  Patient with otherwise no injuries from fall other than small breast ecchymosis/hematoma.  Will x-ray foot at this time if patient needs bracing will refer to orthopedics.  Son agreeable with treatment plan.  Son and patient defer ED visit for foot and do not want to see orthopedic unless she needs evaluation or bracing.  He reports that patient is already improved from last night. - DG Foot Complete Left; Future  Return in about 1 month (around 10/02/2017) for follow up. Caren Macadam, MD 09/01/2017

## 2017-09-01 NOTE — Patient Instructions (Signed)
Get xray of foot-I will call with results  Stop the lisinopril  Get lab tests   Continue all other medications.

## 2017-09-02 ENCOUNTER — Telehealth: Payer: Self-pay | Admitting: Family Medicine

## 2017-09-02 DIAGNOSIS — Z7901 Long term (current) use of anticoagulants: Secondary | ICD-10-CM

## 2017-09-02 LAB — BASIC METABOLIC PANEL
BUN: 22 mg/dL (ref 7–25)
CHLORIDE: 101 mmol/L (ref 98–110)
CO2: 29 mmol/L (ref 20–32)
CREATININE: 0.85 mg/dL (ref 0.60–0.88)
Calcium: 10.3 mg/dL (ref 8.6–10.4)
GLUCOSE: 105 mg/dL — AB (ref 65–99)
Potassium: 3.9 mmol/L (ref 3.5–5.3)
Sodium: 139 mmol/L (ref 135–146)

## 2017-09-02 LAB — TSH: TSH: 0.37 mIU/L — ABNORMAL LOW (ref 0.40–4.50)

## 2017-09-02 LAB — PROTIME-INR
INR: 2.1 — AB
Prothrombin Time: 21.7 s — ABNORMAL HIGH (ref 9.0–11.5)

## 2017-09-02 MED ORDER — LEVOTHYROXINE SODIUM 88 MCG PO TABS: 88.0000 ug | ORAL_TABLET | Freq: Every day | ORAL | 0 refills | Status: DC

## 2017-09-02 NOTE — Telephone Encounter (Signed)
Please call and advise that thyroid is still a little overactive but better. Decrease thyroid medication to levothyroxine 88 mcg po qd. Advise to start the new medication and we will recheck labs in 3 months.   INR is 2.2. She needs to take coumadin at current dose. Do not miss any doses. Follow up for recheck in lab in 2 weeks. I have placed the order.   I have not gotten back the results of the foot xray. Did they go for the xray? Gwen Her. Mannie Stabile, MD

## 2017-09-02 NOTE — Telephone Encounter (Signed)
I called patient's son to inform him that his mother's foot is fractured. He reports that if they were going to be able to go to the orthopedic that they would have to go after 2 pm.  Please call and get an appointment with orthopedics for today or tomorrow after 2.   I did go ahead and discuss thyroid function and dose change with son. I also discussed the coumadin/INR level and their need to take the medicaiton qd and return to lab for recheck in 2 weeks.   Please check on appointment. You may send to [place] to schedule and call patient. Gwen Her. Mannie Stabile, MD

## 2017-09-02 NOTE — Telephone Encounter (Signed)
Noted. Belva Bertin is working on appointment

## 2017-09-02 NOTE — Telephone Encounter (Signed)
Spoke to son, advised of message below. He did take her to get x-ray yesterday.

## 2017-09-09 ENCOUNTER — Encounter: Payer: Self-pay | Admitting: Orthopaedic Surgery

## 2017-09-09 ENCOUNTER — Ambulatory Visit: Payer: Medicare Other | Admitting: Orthopaedic Surgery

## 2017-09-09 VITALS — BP 111/68 | HR 68 | Temp 97.0°F | Ht 63.0 in | Wt 117.0 lb

## 2017-09-09 DIAGNOSIS — S92352A Displaced fracture of fifth metatarsal bone, left foot, initial encounter for closed fracture: Secondary | ICD-10-CM | POA: Diagnosis not present

## 2017-09-09 NOTE — Progress Notes (Signed)
Patient Courtney [name], female DOB:1929/02/14, 84 y.o. YBO:[id number]  Chief Complaint  Patient presents with  . Foot Pain    left foot injury 08/31/17    HPI  Courtney [name] is a 84 y.o. female who fell at home about ten days ago.  She was seen by Dr. Mannie Stabile who got x-rays showing: IMPRESSION: Acute mildly distracted fracture of the base of the fifth metatarsal. Diffuse osteopenia  She has had extensive ecchymosis of the foot.  She has dementia and is cared for by her son who is present.  She uses a cane.  She had no other injury. HPI  Body mass index is 20.73 kg/m.  ROS  Review of Systems  HENT: Negative for congestion.   Respiratory: Negative for cough and shortness of breath.   Cardiovascular: Positive for palpitations. Negative for chest pain and leg swelling.  Endocrine: Positive for cold intolerance.  Musculoskeletal: Positive for arthralgias, gait problem and joint swelling.  Allergic/Immunologic: Positive for environmental allergies.  All other systems reviewed and are negative.   Past Medical History:  Diagnosis Date  . Biatrial enlargement 12/01/2015  . Chronic anticoagulation    coumadin, CHADS2VASC=6  . History of mitral valve replacement   . Hypertension   . Hypothyroidism   . Osteoporosis    on Boniva  . Permanent atrial fibrillation ([place])   . Pulmonary hypertension, moderate to severe ([place]) 12/01/2015  . Stroke Courtney [name])    on Clopidrigel  . UTI (lower urinary tract infection) 07/2015   n&v; AKI  . Vascular dementia     Past Surgical History:  Procedure Laterality Date  . ABDOMINAL HYSTERECTOMY    . FRACTURE SURGERY  [id number]   Operative repair with intramedullary nail intratrochanteric Left Hip fracture  . INTRAMEDULLARY (IM) NAIL INTERTROCHANTERIC Left 12/01/2015   Procedure: INTRAMEDULLARY (IM) NAIL INTERTROCHANTRIC;  Surgeon: Carole Civil, MD;  Location: [facility];  Service: Orthopedics;  Laterality: Left;  . MITRAL VALVE REPLACEMENT      Family  History  Problem Relation Age of Onset  . Heart disease Mother   . Heart disease Father   . Stroke Neg Hx   . Cancer Neg Hx   . Diabetes Neg Hx     Social History Social History   Tobacco Use  . Smoking status: Never Smoker  . Smokeless tobacco: Never Used  Substance Use Topics  . Alcohol use: No  . Drug use: No    Allergies  Allergen Reactions  . Penicillins Swelling    Has patient had a PCN reaction causing immediate rash, facial/tongue/throat swelling, SOB or lightheadedness with hypotension: Yes Has patient had a PCN reaction causing severe rash involving mucus membranes or skin necrosis: No Has patient had a PCN reaction that required hospitalization No Has patient had a PCN reaction occurring within the last 10 years: No If all of the above answers are "NO", then may proceed with Cephalosporin use. Whole body "swelled up" and had to go to the hospital    Current Outpatient Medications  Medication Sig Dispense Refill  . acetaminophen (TYLENOL) 500 MG tablet Take 500 mg by mouth every 6 (six) hours as needed for mild pain.    Marland Kitchen amLODipine (NORVASC) 2.5 MG tablet Take 1 tablet (2.5 mg total) by mouth daily. 90 tablet 3  . atenolol (TENORMIN) 25 MG tablet Take 12.5 mg by mouth daily.     Marland Kitchen b complex vitamins tablet Take 1 tablet by mouth daily.    . clopidogrel (PLAVIX) 75 MG tablet  Take 1 tablet (75 mg total) by mouth daily. Start 12/05/15 60 tablet 2  . ibandronate (BONIVA) 150 MG tablet Take 150 mg by mouth every 30 (thirty) days. Take in the morning with a full glass of water, on an empty stomach, and do not take anything else by mouth or lie down for the next 30 min.    Marland Kitchen levothyroxine (SYNTHROID, LEVOTHROID) 88 MCG tablet Take 1 tablet (88 mcg total) by mouth daily. 90 tablet 0  . triamterene-hydrochlorothiazide (DYAZIDE) 37.5-25 MG capsule Take 1 each (1 capsule total) by mouth every other day. 30 capsule 2  . vitamin C (ASCORBIC ACID) 250 MG tablet Take 250 mg by  mouth daily.     Marland Kitchen VITAMIN D, ERGOCALCIFEROL, PO Take 250 mg by mouth daily.    Marland Kitchen warfarin (COUMADIN) 3 MG tablet Take 1 tablet (3 mg total) daily by mouth. 30 tablet 1  . warfarin (COUMADIN) 5 MG tablet TAKE 1/2 TABLET EACH EVENING BY MOUTH AS DIRECTED. 30 tablet 0   No current facility-administered medications for this visit.      Physical Exam  Blood pressure 111/68, pulse 68, temperature (!) 97 F (36.1 C), height 5\' 3"  (1.6 m), weight 117 lb (53.1 kg).  Constitutional: overall normal hygiene, normal nutrition, well developed, normal grooming, normal body habitus. Assistive device:cane  Musculoskeletal: gait and station Limp left, muscle tone and strength are normal, no tremors or atrophy is present.  .  Neurological: coordination overall normal.  Deep tendon reflex/nerve stretch intact.  Sensation normal.  Cranial nerves II-XII intact.   Skin:   Normal overall no scars, lesions, ulcers or rashes. No psoriasis.  Psychiatric: Alert and oriented x 3.  Recent memory intact, remote memory unclear.  Normal mood and affect. Well groomed.  Good eye contact.  Cardiovascular: overall no swelling, no varicosities, no edema bilaterally, normal temperatures of the legs and arms, no clubbing, cyanosis and good capillary refill.  Lymphatic: palpation is normal.  Left foot and ankle and toes have significant ecchymosis and swelling.  NV intact.  Limp to the left. ROM is tender and decreased.  All other systems reviewed and are negative   The patient has been educated about the nature of the problem(s) and counseled on treatment options.  The patient appeared to understand what I have discussed and is in agreement with it.  Encounter Diagnosis  Name Primary?  . Closed fracture of base of fifth metatarsal bone of left foot, initial encounter Yes    PLAN Call if any problems.  Precautions discussed.  Continue current medications.   Return to clinic 2 weeks   X-rays on return.  She  was fitted with a post op shoe.  I have told the son about care and elevation. I spent some time going over this with him.  He appears to understand.  If he uses a heating pad, use only on low setting.  Return in two weeks.  Call if any problem.  Precautions discussed.   Electronically Signed Sanjuana Kava, MD 2/5/20192:42 PM

## 2017-09-22 ENCOUNTER — Telehealth: Payer: Self-pay | Admitting: Orthopaedic Surgery

## 2017-09-22 NOTE — Telephone Encounter (Signed)
Patient's son called and stated that he needed to reschedule Cincere's appointment until next week.  He said his father was declining health and they needed to wait on appointment for now

## 2017-09-23 ENCOUNTER — Ambulatory Visit: Payer: Medicare Other | Admitting: Orthopaedic Surgery

## 2017-09-30 ENCOUNTER — Ambulatory Visit (INDEPENDENT_AMBULATORY_CARE_PROVIDER_SITE_OTHER): Payer: Medicare Other | Admitting: Orthopaedic Surgery

## 2017-09-30 ENCOUNTER — Encounter: Payer: Self-pay | Admitting: Orthopaedic Surgery

## 2017-09-30 ENCOUNTER — Telehealth: Payer: Self-pay | Admitting: Family Medicine

## 2017-09-30 ENCOUNTER — Ambulatory Visit (INDEPENDENT_AMBULATORY_CARE_PROVIDER_SITE_OTHER): Payer: Medicare Other

## 2017-09-30 DIAGNOSIS — S99192D Other physeal fracture of left metatarsal, subsequent encounter for fracture with routine healing: Secondary | ICD-10-CM | POA: Diagnosis not present

## 2017-09-30 NOTE — Telephone Encounter (Signed)
Patients son stopped by the office to let you know that he stopped lisinopril for 3 days but patient became very aggrivated and confused so he gave it back to her, please call him to discuss. Cb#: [phone]

## 2017-09-30 NOTE — Progress Notes (Signed)
CC:  I feel ok  She has no pain of the left foot. She is using a cane.  NV intact. She has no pain.    X-rays of the left foot were done, reported separately.  Encounter Diagnosis  Name Primary?  . Closed fracture of base of fifth metatarsal bone of left foot at metaphyseal-diaphyseal junction with routine healing, subsequent encounter Yes   Return in one month.  X-rays of the left foot  Call if doing well and cancel.  Call if any problem.  Precautions discussed.   Electronically Signed Sanjuana Kava, MD 2/26/20193:00 PM

## 2017-10-01 NOTE — Telephone Encounter (Signed)
Please advise. Will stopping this medication cause these symptoms?

## 2017-10-01 NOTE — Telephone Encounter (Signed)
Called patient regarding message below. No answer, unable to leave message.  

## 2017-10-01 NOTE — Telephone Encounter (Signed)
Please advise son not to discontinue her blood pressure medicines, but bring her in for a blood pressure check.

## 2017-10-01 NOTE — Telephone Encounter (Signed)
Since giving the medication back to the patient, her son states she has 'returned to her normal self' He is going to try to stop it again, he has not checked her blood pressure. I did advise that confusion and aggravation should not be caused by stopping the lisinopril and that if it happens again, to call us or go to the ED. He verbalized understanding.  Per your last office note, "Son wonders if she can stop 1 of her blood pressure medicine.  He reports that sometimes when they check her blood pressure it is on the low side.  Reports that at times the blood pressure has been around 909 systolic.  Patient denies any dizziness.  Denies any numbness or tingling in her extremities.  No lightheadedness.  Patient has not had any syncopal episodes.  She reports that when she fell it was not because she felt dizzy but rather she tripped."

## 2017-10-01 NOTE — Telephone Encounter (Signed)
Please advise patient's son that stopping lisinopril should not have made her aggravated and confused.  Please advise if patient is aggravated and confused that she needs to be seen and evaluated in the office or in the emergency department.  In addition, please ask him why he wanted to discontinue her lisinopril.

## 2017-10-03 NOTE — Telephone Encounter (Signed)
Called patient regarding message below. No answer, unable to leave message.  

## 2017-10-07 ENCOUNTER — Encounter: Payer: Self-pay | Admitting: Family Medicine

## 2017-10-07 NOTE — Telephone Encounter (Signed)
I have been unable to reach this patient by phone.  A letter is being sent.

## 2017-10-27 ENCOUNTER — Encounter: Payer: Self-pay | Admitting: Orthopedic Surgery

## 2017-10-27 DIAGNOSIS — S92352A Displaced fracture of fifth metatarsal bone, left foot, initial encounter for closed fracture: Secondary | ICD-10-CM | POA: Insufficient documentation

## 2017-10-29 ENCOUNTER — Ambulatory Visit: Payer: Medicare Other | Admitting: Orthopedic Surgery

## 2017-10-29 NOTE — Progress Notes (Deleted)
Fracture care follow-up  No chief complaint on file.   Encounter Diagnosis  Name Primary?  . Closed fracture of base of fifth metatarsal bone of left foot 08/31/17 Yes        Current Outpatient Medications:  .  acetaminophen (TYLENOL) 500 MG tablet, Take 500 mg by mouth every 6 (six) hours as needed for mild pain., Disp: , Rfl:  .  amLODipine (NORVASC) 2.5 MG tablet, Take 1 tablet (2.5 mg total) by mouth daily., Disp: 90 tablet, Rfl: 3 .  atenolol (TENORMIN) 25 MG tablet, Take 12.5 mg by mouth daily. , Disp: , Rfl:  .  b complex vitamins tablet, Take 1 tablet by mouth daily., Disp: , Rfl:  .  clopidogrel (PLAVIX) 75 MG tablet, Take 1 tablet (75 mg total) by mouth daily. Start 12/05/15, Disp: 60 tablet, Rfl: 2 .  ibandronate (BONIVA) 150 MG tablet, Take 150 mg by mouth every 30 (thirty) days. Take in the morning with a full glass of water, on an empty stomach, and do not take anything else by mouth or lie down for the next 30 min., Disp: , Rfl:  .  levothyroxine (SYNTHROID, LEVOTHROID) 88 MCG tablet, Take 1 tablet (88 mcg total) by mouth daily., Disp: 90 tablet, Rfl: 0 .  triamterene-hydrochlorothiazide (DYAZIDE) 37.5-25 MG capsule, Take 1 each (1 capsule total) by mouth every other day., Disp: 30 capsule, Rfl: 2 .  vitamin C (ASCORBIC ACID) 250 MG tablet, Take 250 mg by mouth daily. , Disp: , Rfl:  .  VITAMIN D, ERGOCALCIFEROL, PO, Take 250 mg by mouth daily., Disp: , Rfl:  .  warfarin (COUMADIN) 3 MG tablet, Take 1 tablet (3 mg total) daily by mouth., Disp: 30 tablet, Rfl: 1 .  warfarin (COUMADIN) 5 MG tablet, TAKE 1/2 TABLET EACH EVENING BY MOUTH AS DIRECTED., Disp: 30 tablet, Rfl: 0  There were no vitals taken for this visit.  Physical Exam   Xrays:   Plan   ***

## 2017-10-30 ENCOUNTER — Encounter: Payer: Self-pay | Admitting: Family Medicine

## 2017-10-30 ENCOUNTER — Ambulatory Visit (INDEPENDENT_AMBULATORY_CARE_PROVIDER_SITE_OTHER): Payer: Medicare Other | Admitting: Family Medicine

## 2017-10-30 ENCOUNTER — Other Ambulatory Visit: Payer: Self-pay

## 2017-10-30 VITALS — BP 130/84 | HR 84 | Temp 98.0°F | Resp 16 | Ht 63.0 in | Wt 122.8 lb

## 2017-10-30 DIAGNOSIS — I639 Cerebral infarction, unspecified: Secondary | ICD-10-CM | POA: Diagnosis not present

## 2017-10-30 DIAGNOSIS — E039 Hypothyroidism, unspecified: Secondary | ICD-10-CM | POA: Diagnosis not present

## 2017-10-30 DIAGNOSIS — I10 Essential (primary) hypertension: Secondary | ICD-10-CM | POA: Diagnosis not present

## 2017-10-30 DIAGNOSIS — Z952 Presence of prosthetic heart valve: Secondary | ICD-10-CM

## 2017-10-30 DIAGNOSIS — M810 Age-related osteoporosis without current pathological fracture: Secondary | ICD-10-CM | POA: Diagnosis not present

## 2017-10-30 DIAGNOSIS — I482 Chronic atrial fibrillation, unspecified: Secondary | ICD-10-CM

## 2017-10-30 NOTE — Progress Notes (Signed)
Patient ID: Courtney [name], female    DOB: 1929/02/09, 84 y.o.   MRN: [id number]  Chief Complaint  Patient presents with  . Follow-up    Allergies Penicillins  Subjective:   Courtney [name] is a 84 y.o. female who presents to [facility] today.  HPI Courtney [name] presents here for follow up.  Both her and her son report that she has been doing well.  He has not had any subsequent falls. Energy and appetite good. Taking all medications. BP running well. No problems per report of son and patient.  Has been on lower dose of thyroid medication.  No constipation or skin issues.  No fatigue. Sleeping well and mood good.  BP running well. Taking all medications. Still on boniva and has been on it for over 5 years. Son does not her to stop b/c he is worried about her bones and her fall risk. Not taking calcium or vitamin D.  Is not had a recent bone density.  Still on coumadin and plavix. On both for valve and cardioembolic strokes on coumadin alone. Has not had subsequent CVA/TIA since on plavix too. No bleeding. Consistent with coumadin dose but not with getting labs done. Son reports that they forgot to come in for labs.   Patient does have a's prosthetic MV which is [place]/carbon graphite placed in 2002 at [facility] by Dr. Leeroy Bock. Last ECHO was in 11/2015 at [facility] prior to hip surgery. No SOB, no edema, No DOE.  She reports she is doing well.  Is continuing on her anticoagulation.     Past Medical History:  Diagnosis Date  . Biatrial enlargement 12/01/2015  . Chronic anticoagulation    coumadin, CHADS2VASC=6  . History of mitral valve replacement   . Hypertension   . Hypothyroidism   . Osteoporosis    on Boniva  . Permanent atrial fibrillation ([place])   . Pulmonary hypertension, moderate to severe ([place]) 12/01/2015  . Stroke [facility])    on Clopidrigel  . UTI (lower urinary tract infection) 07/2015   n&v; AKI  . Vascular dementia     Past Surgical History:  Procedure  Laterality Date  . ABDOMINAL HYSTERECTOMY    . FRACTURE SURGERY  [id number]   Operative repair with intramedullary nail intratrochanteric Left Hip fracture  . INTRAMEDULLARY (IM) NAIL INTERTROCHANTERIC Left 12/01/2015   Procedure: INTRAMEDULLARY (IM) NAIL INTERTROCHANTRIC;  Surgeon: Carole Civil, MD;  Location: [facility];  Service: Orthopedics;  Laterality: Left;  . MITRAL VALVE REPLACEMENT      Family History  Problem Relation Age of Onset  . Heart disease Mother   . Heart disease Father   . Stroke Neg Hx   . Cancer Neg Hx   . Diabetes Neg Hx      Social History   Socioeconomic History  . Marital status: Divorced    Spouse name: Not on file  . Number of children: Not on file  . Years of education: Not on file  . Highest education level: Not on file  Occupational History  . Occupation: Retired  Scientific laboratory technician  . Financial resource strain: Not on file  . Food insecurity:    Worry: Not on file    Inability: Not on file  . Transportation needs:    Medical: Not on file    Non-medical: Not on file  Tobacco Use  . Smoking status: Never Smoker  . Smokeless tobacco: Never Used  Substance and Sexual Activity  . Alcohol use: No  .  Drug use: No  . Sexual activity: Never  Lifestyle  . Physical activity:    Days per week: Not on file    Minutes per session: Not on file  . Stress: Not on file  Relationships  . Social connections:    Talks on phone: Not on file    Gets together: Not on file    Attends religious service: Not on file    Active member of club or organization: Not on file    Attends meetings of clubs or organizations: Not on file    Relationship status: Not on file  Other Topics Concern  . Not on file  Social History Narrative   Lives with her ex-husband and son when in [place]. Has a house in [place], [place].   Current Outpatient Medications on File Prior to Visit  Medication Sig Dispense Refill  . acetaminophen (TYLENOL) 500 MG tablet Take 500 mg by mouth  every 6 (six) hours as needed for mild pain.    Marland Kitchen amLODipine (NORVASC) 2.5 MG tablet Take 1 tablet (2.5 mg total) by mouth daily. 90 tablet 3  . atenolol (TENORMIN) 25 MG tablet Take 12.5 mg by mouth daily.     Marland Kitchen b complex vitamins tablet Take 1 tablet by mouth daily.    . clopidogrel (PLAVIX) 75 MG tablet Take 1 tablet (75 mg total) by mouth daily. Start 12/05/15 60 tablet 2  . ibandronate (BONIVA) 150 MG tablet Take 150 mg by mouth every 30 (thirty) days. Take in the morning with a full glass of water, on an empty stomach, and do not take anything else by mouth or lie down for the next 30 min.    Marland Kitchen levothyroxine (SYNTHROID, LEVOTHROID) 88 MCG tablet Take 1 tablet (88 mcg total) by mouth daily. 90 tablet 0  . lisinopril (PRINIVIL,ZESTRIL) 2.5 MG tablet     . triamterene-hydrochlorothiazide (DYAZIDE) 37.5-25 MG capsule Take 1 each (1 capsule total) by mouth every other day. 30 capsule 2  . vitamin C (ASCORBIC ACID) 250 MG tablet Take 250 mg by mouth daily.     Marland Kitchen VITAMIN D, ERGOCALCIFEROL, PO Take 250 mg by mouth daily.    Marland Kitchen warfarin (COUMADIN) 3 MG tablet Take 1 tablet (3 mg total) daily by mouth. 30 tablet 1  . warfarin (COUMADIN) 5 MG tablet TAKE 1/2 TABLET EACH EVENING BY MOUTH AS DIRECTED. 30 tablet 0   No current facility-administered medications on file prior to visit.     Review of Systems  Constitutional: Negative for activity change, appetite change, fatigue, fever and unexpected weight change.  Eyes: Negative for visual disturbance.  Respiratory: Negative for cough, chest tightness and shortness of breath.   Cardiovascular: Negative for chest pain, palpitations and leg swelling.  Gastrointestinal: Negative for abdominal pain, constipation, nausea and vomiting.  Genitourinary: Negative for dysuria, frequency and urgency.  Musculoskeletal: Negative for arthralgias, back pain, joint swelling, neck pain and neck stiffness.  Neurological: Negative for dizziness, tremors, syncope,  weakness, light-headedness and headaches.  Hematological: Negative for adenopathy.  Psychiatric/Behavioral: Negative for agitation, behavioral problems, dysphoric mood and sleep disturbance.     Objective:   BP 130/84 (BP Location: Left Arm, Patient Position: Sitting, Cuff Size: Normal)   Pulse 84   Temp 98 F (36.7 C) (Temporal)   Resp 16   Ht 5\' 3"  (1.6 m)   Wt 122 lb 12 oz (55.7 kg)   SpO2 94%   BMI 21.74 kg/m   Physical Exam  Constitutional: She is oriented  to person, place, and time. She appears well-developed and well-nourished. No distress.  HENT:  Head: Normocephalic and atraumatic.  Eyes: Pupils are equal, round, and reactive to light.  Neck: Normal range of motion. Neck supple. No thyromegaly present.  Cardiovascular: Normal rate, regular rhythm and normal heart sounds.  Pulmonary/Chest: Effort normal and breath sounds normal. No respiratory distress.  Abdominal: Soft. Bowel sounds are normal. She exhibits no distension.  Neurological: She is alert and oriented to person, place, and time. No cranial nerve deficit.  Skin: Skin is warm and dry. No rash noted.  Psychiatric: She has a normal mood and affect. Her behavior is normal.  Nursing note and vitals reviewed.    Assessment and Plan  1. Age-related osteoporosis without current pathological fracture Continue Boniva at this time.  I did have discussion with patient and son today that length of duration of this medication and continued benefit is uncertain.  At this time we will plan to go ahead and get a bone density and compared to her previous.  Her son will get the bone density.  She has been on Boniva for over 5 years and if her bone density is stable will consider stopping this medication.  We will also check her vitamin D levels.  She was encouraged to start calcium and dosing information was given. - VITAMIN D 25 Hydroxy (Vit-D Deficiency, Fractures) - DG Bone Density; Future  2. Hypothyroidism, unspecified  type Continue current thyroid medication.  Check TSH at this time.  3 months ago her TSH was suppressed and her dose was decreased. - TSH  3. Cardioembolic stroke [facility]) Patient with history of chronic A. fib and cardioembolic stroke while on systemic anticoagulation.  Has been on Plavix in addition to Coumadin for several years with no subsequent TIAs or strokes.  Bleeding risk was discussed again today.  Continue medications as directed.  4. Chronic atrial fibrillation ([facility]) Patient with chronic A. fib on Coumadin.  We will check her INR today.  Patient is asymptomatic.  They do not wish to follow-up with cardiologist at this time.  She is currently stable.  I have asked him to continue her medications as directed. - Protime-INR  5. S/P mitral valve replacement Patient is on systemic anticoagulation secondary to mitral valve replacement.  They have not been compliant with coming in to get the INR checked.  I did discuss the importance of routine INR monitoring.  We will get an INR today.  She has been alternating doses of 2 mg and 3 mg of Coumadin.  They do report compliance.  - Protime-INR  Fall risk and fall prevention discussed today. Return in about 3 months (around 01/30/2018) for Follow-up. Caren Macadam, MD 10/31/2017

## 2017-10-30 NOTE — Patient Instructions (Addendum)
Bone Densitometry Bone densitometry is an imaging test that uses a special X-ray to measure the amount of calcium and other minerals in your bones (bone density). This test is also known as a bone mineral density test or dual-energy X-ray absorptiometry (DXA). The test can measure bone density at your hip and your spine. It is similar to having a regular X-ray. You may have this test to:  Diagnose a condition that causes weak or thin bones (osteoporosis).  Predict your risk of a broken bone (fracture).  Determine how well osteoporosis treatment is working.  Tell a health care provider about:  Any allergies you have.  All medicines you are taking, including vitamins, herbs, eye drops, creams, and over-the-counter medicines.  Any problems you or family members have had with anesthetic medicines.  Any blood disorders you have.  Any surgeries you have had.  Any medical conditions you have.  Possibility of pregnancy.  Any other medical test you had within the previous 14 days that used contrast material. What are the risks? Generally, this is a safe procedure. However, problems can occur and may include the following:  This test exposes you to a very small amount of radiation.  The risks of radiation exposure may be greater to unborn children.  What happens before the procedure?  Do not take any calcium supplements for 24 hours before having the test. You can otherwise eat and drink what you usually do.  Take off all metal jewelry, eyeglasses, dental appliances, and any other metal objects. What happens during the procedure?  You may lie on an exam table. There will be an X-ray generator below you and an imaging device above you.  Other devices, such as boxes or braces, may be used to position your body properly for the scan.  You will need to lie still while the machine slowly scans your body.  The images will show up on a computer monitor. What happens after the  procedure? You may need more testing at a later time. This information is not intended to replace advice given to you by your health care provider. Make sure you discuss any questions you have with your health care provider. Document Released: 08/13/2004 Document Revised: 12/28/2015 Document Reviewed: 12/30/2013 Elsevier Interactive Patient Education  2018 [employer].   Calcium 600 mg a twice a day I will let you know about the vitamin D Bone density  Calcium Intake Recommendations Calcium is a mineral that affects many functions in the body, including: Blood clotting. Blood vessel function. Nerve impulse conduction. Hormone secretion. Muscle contraction. Bone and teeth functions.  Most of your body's calcium supply is stored in your bones and teeth. When your calcium stores are low, you may be at risk for low bone mass, bone loss, and bone fractures. Consuming enough calcium helps to grow healthy bones and teeth and to prevent breakdown over time. It is very important that you get enough calcium if you are: A child undergoing rapid growth. An adolescent girl. A pre- or post-menopausal woman. A woman whose menstrual cycle has stopped due to anorexia nervosa or regular intense exercise. An individual with lactose intolerance or a milk allergy. A vegetarian.  What is my plan? Try to consume the recommended amount of calcium daily based on your age. Depending on your overall health, your health care provider may recommend increased calcium intake.General daily calcium intake recommendations by age are: Birth to 6 months: 200 mg. Infants 7 to 12 months: 260 mg. Children 1 to  3 years: 700 mg. Children 4 to 8 years: 1,000 mg. Children 9 to 13 years: 1,300 mg. Teens 14 to 18 years: 1,300 mg. Adults 19 to 50 years: 1,000 mg. Adult women 51 to 70 years: 1,200 mg. Adult men 51 to 70 years: 1,000 mg. Adults 71 years and older: 1,200 mg. Pregnant and breastfeeding teens: 1,300  mg. Pregnant and breastfeeding adults: 1,000 mg.  What do I need to know about calcium intake? In order for the body to absorb calcium, it needs vitamin D. You can get vitamin D through: Direct exposure of the skin to sunlight. Foods, such as egg yolks, liver, saltwater fish, and fortified milk. Supplements. Consuming too much calcium may cause: Constipation. Decreased absorption of iron and zinc. Kidney stones. Calcium supplements may interact with certain medicines. Check with your health care provider before starting any calcium supplements. Try to get most of your calcium from food. What foods can I eat? Grains  Fortified oatmeal. Fortified ready-to-eat cereals. Fortified frozen waffles. Vegetables Turnip greens. Broccoli. Fruits Fortified orange juice. Meats and Other Protein Sources Canned sardines with bones. Canned salmon with bones. Soy beans. Tofu. Baked beans. Almonds. [place] nuts. Sunflower seeds. Dairy Milk. Yogurt. Cheese. Cottage cheese. Beverages Fortified soy milk. Fortified rice milk. Sweets/Desserts Pudding. Ice Cream. Milkshakes. Blackstrap molasses. The items listed above may not be a complete list of recommended foods or beverages. Contact your dietitian for more options. What foods can affect my calcium intake? It may be more difficult for your body to use calcium or calcium may leave your body more quickly if you consume large amounts of: Sodium. Protein. Caffeine. Alcohol.  This information is not intended to replace advice given to you by your health care provider. Make sure you discuss any questions you have with your health care provider. Document Released: 03/05/2004 Document Revised: 02/09/2016 Document Reviewed: 12/28/2013 Elsevier Interactive Patient Education  2018 [place] protect organs, store calcium, and anchor muscles. Good health habits, such as eating nutritious foods and exercising regularly, are important  for maintaining healthy bones. They can also help to prevent a condition that causes bones to lose density and become weak and brittle (osteoporosis). Why is bone mass important? Bone mass refers to the amount of bone tissue that you have. The higher your bone mass, the stronger your bones. An important step toward having healthy bones throughout life is to have strong and dense bones during childhood. A young adult who has a high bone mass is more likely to have a high bone mass later in life. Bone mass at its greatest it is called peak bone mass. A large decline in bone mass occurs in older adults. In women, it occurs about the time of menopause. During this time, it is important to practice good health habits, because if more bone is lost than what is replaced, the bones will become less healthy and more likely to break (fracture). If you find that you have a low bone mass, you may be able to prevent osteoporosis or further bone loss by changing your diet and lifestyle. How can I find out if my bone mass is low? Bone mass can be measured with an X-ray test that is called a bone mineral density (BMD) test. This test is recommended for all women who are age 40 or older. It may also be recommended for men who are age 30 or older, or for people who are more likely to develop osteoporosis due to:  Having  bones that break easily.  Having a long-term disease that weakens bones, such as kidney disease or rheumatoid arthritis.  Having menopause earlier than normal.  Taking medicine that weakens bones, such as steroids, thyroid hormones, or hormone treatment for breast cancer or prostate cancer.  Smoking.  Drinking three or more alcoholic drinks each day.  What are the nutritional recommendations for healthy bones? To have healthy bones, you need to get enough of the right minerals and vitamins. Most nutrition experts recommend getting these nutrients from the foods that you eat. Nutritional  recommendations vary from person to person. Ask your health care provider what is healthy for you. Here are some general guidelines. Calcium Recommendations Calcium is the most important (essential) mineral for bone health. Most people can get enough calcium from their diet, but supplements may be recommended for people who are at risk for osteoporosis. Good sources of calcium include:  Dairy products, such as low-fat or nonfat milk, cheese, and yogurt.  Dark green leafy vegetables, such as bok choy and broccoli.  Calcium-fortified foods, such as orange juice, cereal, bread, soy beverages, and tofu products.  Nuts, such as almonds.  Follow these recommended amounts for daily calcium intake:  Children, age 87?3: 700 mg.  Children, age 61?8: 1,000 mg.  Children, age 612?13: 1,300 mg.  Teens, age 28?18: 1,300 mg.  Adults, age 618?50: 1,000 mg.  Adults, age 63?70: ? Men: 1,000 mg. ? Women: 1,200 mg.  Adults, age 877 or older: 1,200 mg.  Pregnant and breastfeeding females: ? Teens: 1,300 mg. ? Adults: 1,000 mg.  Vitamin D Recommendations Vitamin D is the most essential vitamin for bone health. It helps the body to absorb calcium. Sunlight stimulates the skin to make vitamin D, so be sure to get enough sunlight. If you live in a cold climate or you do not get outside often, your health care provider may recommend that you take vitamin D supplements. Good sources of vitamin D in your diet include:  Egg yolks.  Saltwater fish.  Milk and cereal fortified with vitamin D.  Follow these recommended amounts for daily vitamin D intake:  Children and teens, age 610?18: 36 international units.  Adults, age 610 or younger: 400-800 international units.  Adults, age 64 or older: 800-1,000 international units.  Other Nutrients Other nutrients for bone health include:  Phosphorus. This mineral is found in meat, poultry, dairy foods, nuts, and legumes. The recommended daily intake for adult  men and adult women is 700 mg.  Magnesium. This mineral is found in seeds, nuts, dark green vegetables, and legumes. The recommended daily intake for adult men is 400?420 mg. For adult women, it is 310?320 mg.  Vitamin K. This vitamin is found in green leafy vegetables. The recommended daily intake is 120 mg for adult men and 90 mg for adult women.  What type of physical activity is best for building and maintaining healthy bones? Weight-bearing and strength-building activities are important for building and maintaining peak bone mass. Weight-bearing activities cause muscles and bones to work against gravity. Strength-building activities increases muscle strength that supports bones. Weight-bearing and muscle-building activities include:  Walking and hiking.  Jogging and running.  Dancing.  Gym exercises.  Lifting weights.  Tennis and racquetball.  Climbing stairs.  Aerobics.  Adults should get at least 30 minutes of moderate physical activity on most days. Children should get at least 60 minutes of moderate physical activity on most days. Ask your health care provide what type of exercise is  best for you. Where can I find more information? For more information, check out the following websites:  [place]: YardHomes.se  [employer] of Health: http://www.niams.AnonymousEar.fr.asp  This information is not intended to replace advice given to you by your health care provider. Make sure you discuss any questions you have with your health care provider. Document Released: 10/12/2003 Document Revised: 02/09/2016 Document Reviewed: 07/27/2014 Elsevier Interactive Patient Education  [employer].

## 2017-10-31 ENCOUNTER — Telehealth: Payer: Self-pay | Admitting: Family Medicine

## 2017-10-31 DIAGNOSIS — I482 Chronic atrial fibrillation, unspecified: Secondary | ICD-10-CM

## 2017-10-31 LAB — VITAMIN D 25 HYDROXY (VIT D DEFICIENCY, FRACTURES): VIT D 25 HYDROXY: 23 ng/mL — AB (ref 30–100)

## 2017-10-31 LAB — PROTIME-INR
INR: 1.4 — AB
Prothrombin Time: 15.3 s — ABNORMAL HIGH (ref 9.0–11.5)

## 2017-10-31 LAB — TSH: TSH: 2.64 m[IU]/L (ref 0.40–4.50)

## 2017-10-31 MED ORDER — VITAMIN D (ERGOCALCIFEROL) 1.25 MG (50000 UNIT) PO CAPS: 50000.0000 [IU] | ORAL_CAPSULE | ORAL | 0 refills | Status: DC

## 2017-10-31 NOTE — Telephone Encounter (Signed)
Also, when you call them advised that her thyroid is within normal limits.  She needs to continue her current dosing of thyroxine 88 mcg p.o. daily.  Please make sure they have refills.  In addition her vitamin D is low.  Start vitamin D 50,000 units, 1 p.o. weekly.  We will treat her for 3 months and then repeat her levels.  She also should be taking her calcium.  Vitamin D has been sent to the pharmacy.

## 2017-10-31 NOTE — Telephone Encounter (Signed)
Yesterday at office visit, son reports that patient has been taking alternating days of 3 mg and 2 mg of Coumadin.  This correlates to a total Coumadin dosage of 18 mg a week.  Please call patient's son and advised that her INR is lower than it needs to be.  This increases her risk of stroke.  she needs to increase her Coumadin to 3 mg a day.  She needs to return back to clinic in 10 days for recheck of her INR.  I have placed order at this time.  Please advise him that it is very important that she is compliant and comes back to the lab to get this checked.  Please make sure they have enough of the Coumadin 3 mg tablets.  If not please call in #[phone] with 1 refill.  Needs to come back to the lab for recheck.  Please stress this.

## 2017-10-31 NOTE — Telephone Encounter (Signed)
Left message

## 2017-11-03 NOTE — Telephone Encounter (Signed)
Called patient regarding message below. No answer, left generic message for patient to return call.   

## 2017-11-04 NOTE — Telephone Encounter (Signed)
Son informed.

## 2017-11-04 NOTE — Telephone Encounter (Signed)
Called patient regarding message below. No answer, left generic message for patient to return call.   

## 2017-11-10 ENCOUNTER — Other Ambulatory Visit (HOSPITAL_COMMUNITY): Payer: Medicare Other

## 2017-11-11 ENCOUNTER — Telehealth: Payer: Self-pay | Admitting: Family Medicine

## 2017-11-11 NOTE — Telephone Encounter (Signed)
Called patient to r/s appt per r/s list. Spoke with son, r/s appt to 12/01/17 (only available ) Placed patient on r/s list per sons request.

## 2017-11-19 ENCOUNTER — Ambulatory Visit: Payer: Medicare Other | Admitting: Family Medicine

## 2017-11-22 ENCOUNTER — Other Ambulatory Visit: Payer: Self-pay | Admitting: Family Medicine

## 2017-12-01 ENCOUNTER — Ambulatory Visit: Payer: Medicare Other | Admitting: Family Medicine

## 2017-12-23 ENCOUNTER — Telehealth: Payer: Self-pay | Admitting: Family Medicine

## 2017-12-23 NOTE — Telephone Encounter (Signed)
Patient left handicap parking application form here at their last office visit, I do not have this in any of my folder up front. Do you have this ? Patient went to Urgent Care today, she has shingles. Patient just had her INR done today as well. Son stopped by with all this information.  Cb#: [phone]/ [phone]   I will be out of the office 12/24/17-12/30/17 please forward all messages to betsy.

## 2017-12-24 LAB — POCT INR: INR: 1.6 — AB (ref <=1.1)

## 2017-12-24 LAB — PROTIME-INR: Protime: 17.1 — AB (ref 10.0–13.8)

## 2017-12-24 LAB — TSH: TSH: 8.75 — AB (ref <=5.90)

## 2017-12-24 NOTE — Telephone Encounter (Signed)
Spoke with Theone Murdoch (son) and advised him of Dr.Hagler's recommendations with verbal understanding. He stated he picked up the lab results and dropped them off here at the office. I let him know that once Dr. Mannie Stabile had a chance to review the results she would forward me a message and I would call him. He requested that if he didn't answer the phone to please leave a detailed message on the vm and I said I would. I asked him if he would please call the number on the placard and request another copy of the paperwork to renew the handicap placards and he said he would as I didn't have the paperwork and I hadn't yet started working with Dr.Hagler at the time of his mother's last visit.

## 2017-12-24 NOTE — Telephone Encounter (Signed)
Please call and advised that we will let him know when we get the INR back.  I am sorry that she has shingles.  I had signed the form at the last visit I do believe.  Please see if you have the handicap form in any of your paperwork.  Otherwise, we would need to have another one completed for them to pick up.

## 2017-12-24 NOTE — Telephone Encounter (Signed)
What lab results did he pick up? I have not received any or had any to review. Where did he have it drawn? We need to request it. See if we have a handicap form, we used to have a folder with extra forms. Gwen Her. Mannie Stabile, MD

## 2017-12-25 ENCOUNTER — Telehealth: Payer: Self-pay | Admitting: Family Medicine

## 2017-12-25 NOTE — Telephone Encounter (Signed)
Left detailed message on home vm per son's request. Asked son to call office back.

## 2017-12-25 NOTE — Telephone Encounter (Signed)
Thryroid 88 micrograms a day And  Coumadin 3 mg a day  You can leave Detailed information on the machine

## 2017-12-25 NOTE — Telephone Encounter (Signed)
Labs left at front dest after you left for the day. I placed them on your desk. I will ask front staff about an extra form for placard.

## 2017-12-25 NOTE — Telephone Encounter (Signed)
Please call son and ask if she has been taking her coumadin and thyroid medication each day. Both her INR and her thyroid were low, indicating that she might have been missing some doses.  Please confirm what dose of coumadin she is taking and what dose of synthroid she is taking.

## 2017-12-25 NOTE — Telephone Encounter (Signed)
Patient's son called back with the following information:  Thryroid 88 micrograms a day And  Coumadin 3 mg a day  You can leave Detailed information on the machine

## 2017-12-26 ENCOUNTER — Telehealth: Payer: Self-pay | Admitting: Family Medicine

## 2017-12-26 DIAGNOSIS — I639 Cerebral infarction, unspecified: Secondary | ICD-10-CM

## 2017-12-26 DIAGNOSIS — E039 Hypothyroidism, unspecified: Secondary | ICD-10-CM

## 2017-12-26 DIAGNOSIS — Z952 Presence of prosthetic heart valve: Secondary | ICD-10-CM

## 2017-12-26 DIAGNOSIS — M810 Age-related osteoporosis without current pathological fracture: Secondary | ICD-10-CM

## 2017-12-26 DIAGNOSIS — I482 Chronic atrial fibrillation, unspecified: Secondary | ICD-10-CM

## 2017-12-26 MED ORDER — WARFARIN SODIUM 3 MG PO TABS: ORAL_TABLET | ORAL | 0 refills | Status: DC

## 2017-12-26 MED ORDER — WARFARIN SODIUM 4 MG PO TABS: ORAL_TABLET | ORAL | 0 refills | Status: DC

## 2017-12-26 MED ORDER — LEVOTHYROXINE SODIUM 100 MCG PO TABS
100.0000 ug | ORAL_TABLET | Freq: Every day | ORAL | 1 refills | Status: DC
Start: 2017-12-26 — End: 2018-02-10

## 2017-12-26 NOTE — Telephone Encounter (Signed)
Aware and repeated doses back to me. Aware to come in 4 weeks and bring meds. appt scheduled

## 2017-12-26 NOTE — Telephone Encounter (Signed)
Please see note in chart, I sent message to Courtney [name] this morning about this. Courtney [name]. Mannie Stabile, MD

## 2017-12-26 NOTE — Telephone Encounter (Signed)
Son called and said patients INR has changed and he is waiting for Dr Roma Kayser nurse to call him with the change in medication.  Please call today. [phone]

## 2017-12-26 NOTE — Telephone Encounter (Signed)
Please call and advise that I have received the labs. INR is low.  Needs to be in range so that she does not get a clot. Very important due to [name] heart valve.  She should increase coumadin to 4 mg on Saturday, 4 mg on Sunday, and coumadin 3 mg on the other days of the week.   Increase the synthroid to 100 mcg a day.   Needs to schedule OV within the next month.  Needs to come to the lab and get INR rechecked in 10 days.  Very important.   Also, please advise that I am referring to cardiology for evaluation of [name] coumadin and heart issues and for  follow up in their coumadin clinic. I believe that this would help with consistency and medication compliance/adherence. She is at high risk of adverse side effects from low INR or High INR due to fall risk.   Please review medications. Advise make sure they d/c the synthroid 88 mcg a day and adjust coumadin as directed.  Advise to schedule follow up and bring all medications to visit.  Courtney [name]. Mannie Stabile, MD

## 2018-01-05 ENCOUNTER — Telehealth: Payer: Self-pay | Admitting: Family Medicine

## 2018-01-05 NOTE — Telephone Encounter (Signed)
You can Leave the Lab Results on the Answer Machine.  Also the PTs son is looking for the 2 nd placard form he dropped off. I advised I didn't have it up front, however Dr Mannie Stabile would be back in on Tues, and I would Call when it was completed and ready.

## 2018-01-05 NOTE — Telephone Encounter (Signed)
I left the placard paper on your desk for you to sign.

## 2018-01-06 LAB — PROTIME-INR
INR: 2 — ABNORMAL HIGH
Prothrombin Time: 21.3 s — ABNORMAL HIGH (ref 9.0–11.5)

## 2018-01-06 NOTE — Telephone Encounter (Signed)
Done

## 2018-01-06 NOTE — Telephone Encounter (Signed)
Handicap placard paperwork completed and signed by Dr.Hagler and given to Betsy May so that she could call son to come and pick up

## 2018-01-07 ENCOUNTER — Telehealth: Payer: Self-pay | Admitting: Family Medicine

## 2018-01-07 DIAGNOSIS — I482 Chronic atrial fibrillation, unspecified: Secondary | ICD-10-CM

## 2018-01-07 NOTE — Telephone Encounter (Signed)
Please call son and advise to continue mother's current Coumadin dosing.  Advised to make sure that they are taking the medication every day.  INR is now 2.0.  Please return to the lab in 10 to 14 days to recheck INR.  It is very important that they make sure they come in for this blood work.  Lab order has been placed.

## 2018-01-07 NOTE — Telephone Encounter (Signed)
Left detailed message with Dr.Hagler's recommendations per dpr and son's request on vm. Encouraged patient to call with questions or concerns.

## 2018-01-08 ENCOUNTER — Encounter: Payer: Self-pay | Admitting: Family Medicine

## 2018-01-09 ENCOUNTER — Encounter: Payer: Self-pay | Admitting: Family Medicine

## 2018-01-11 ENCOUNTER — Other Ambulatory Visit: Payer: Self-pay | Admitting: Family Medicine

## 2018-01-16 IMAGING — CT CT MAXILLOFACIAL W/O CM
4 of 6 series · 16 of 47 positions shown, 18 images · non-contrast
Comparison: None.

CLINICAL DATA: Patient fell in parking lot of [employer].
Patient fell face first and has scrapes and bruising on left check
and forehead. Bruising and swelling on left wrist. Patient complains
of rib pain to left flank with worsening pain while taking a deep
breath

EXAM:
CT HEAD WITHOUT CONTRAST
CT MAXILLOFACIAL WITHOUT CONTRAST
TECHNIQUE: Multidetector CT imaging of the head and maxillofacial structures
were performed using the standard protocol without intravenous
contrast. Multiplanar CT image reconstructions of the maxillofacial
structures were also generated.

[Series 2: head wo · axial · 0.37mm/px · z∈[+71,+191]mm · 7 of 32 slices shown, 9 images]
[im 4/32  brain]
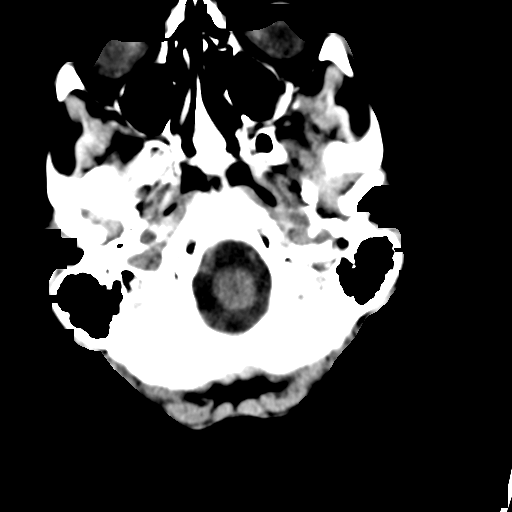
[im 4/32  bone]
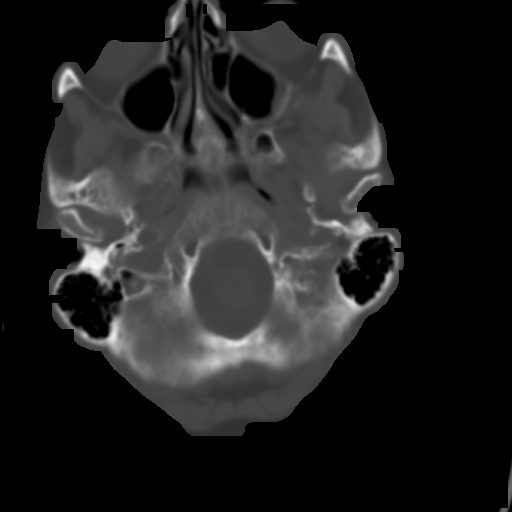
[im 8/32  bone]
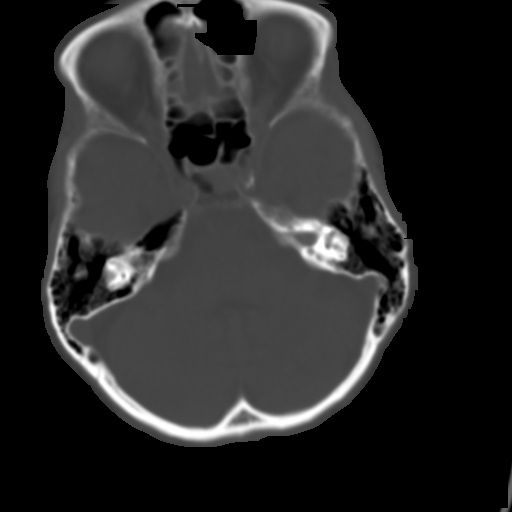
[im 12/32  bone]
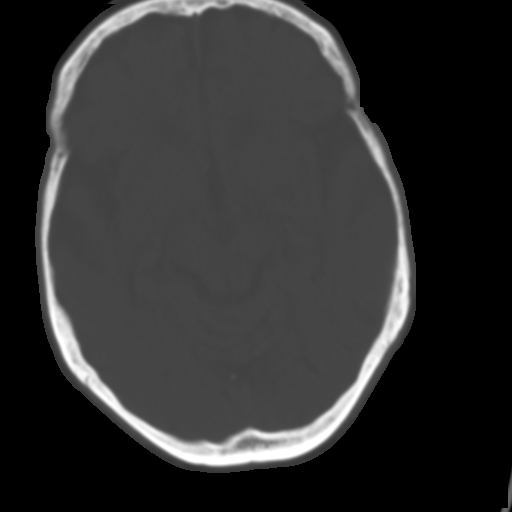
[im 16/32  bone]
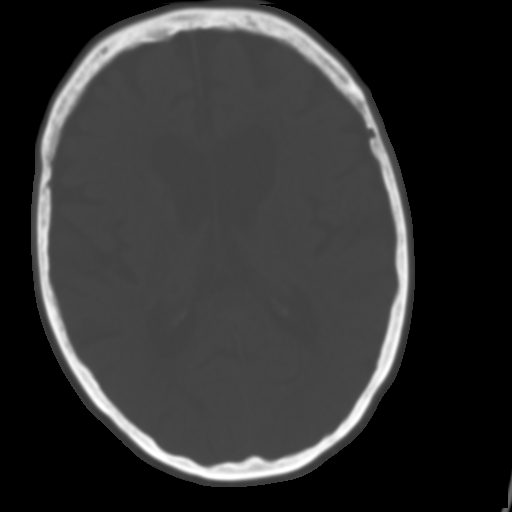
[im 20/32  brain]
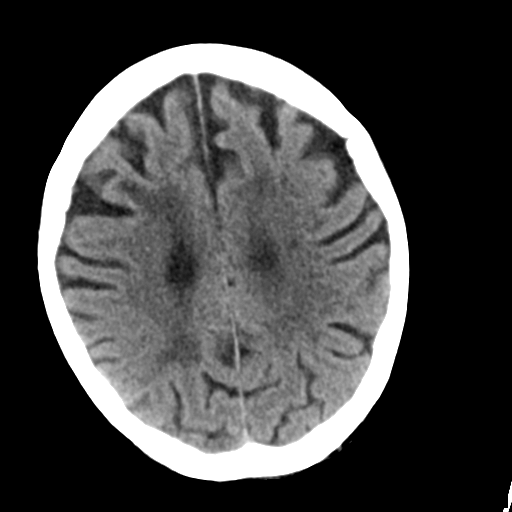
[im 20/32  bone]
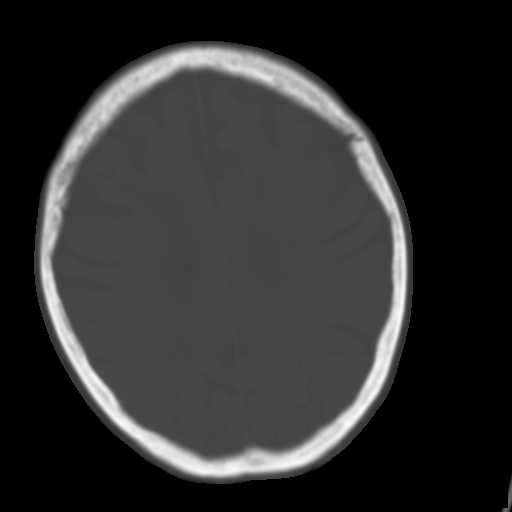
[im 24/32  bone]
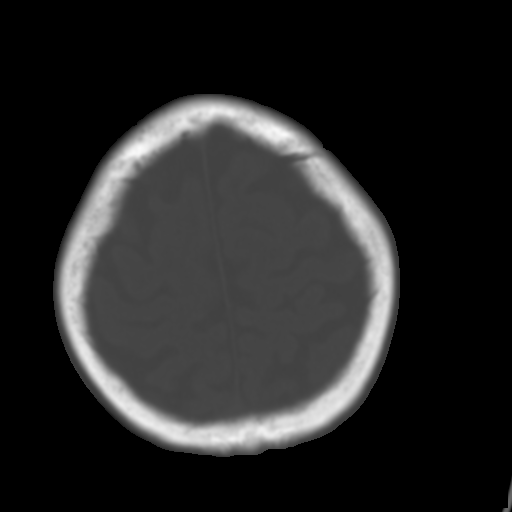
[im 28/32  bone]
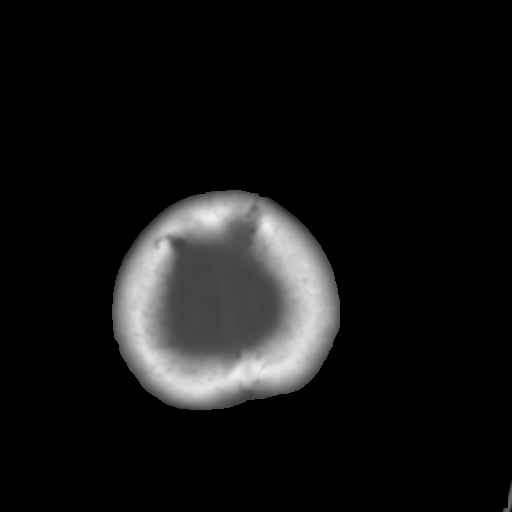

[Series 5: sagittal soft tissue · sagittal · 0.31mm/px · 1 of 67 slices shown]
[im 34/67  bone]
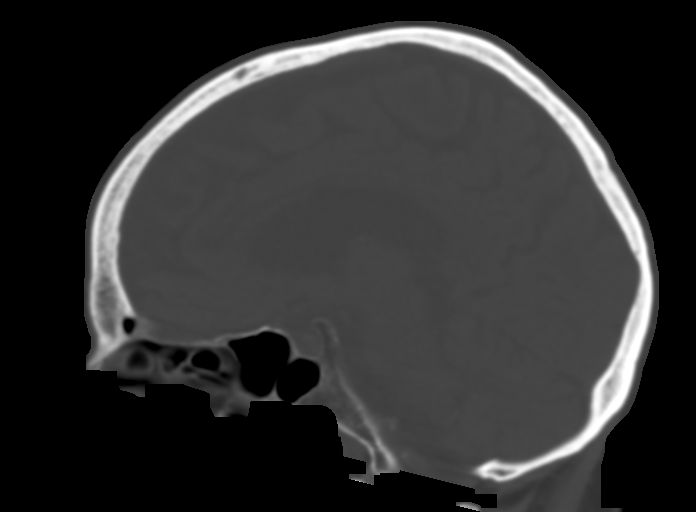

[Series 6: max soft · axial · 0.33mm/px · z∈[-11,+57]mm · 5 of 77 slices shown]
[im 8/77  brain]
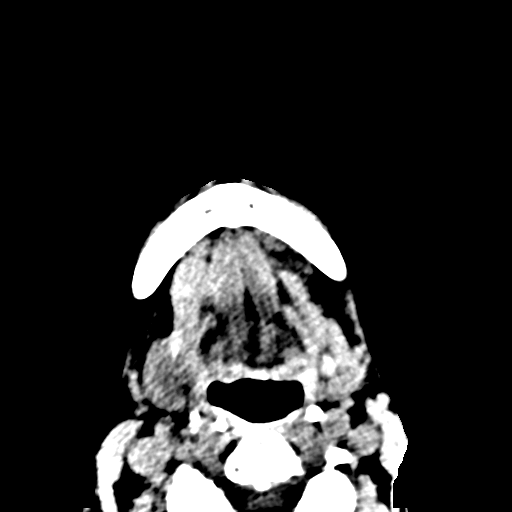
[im 16/77  brain]
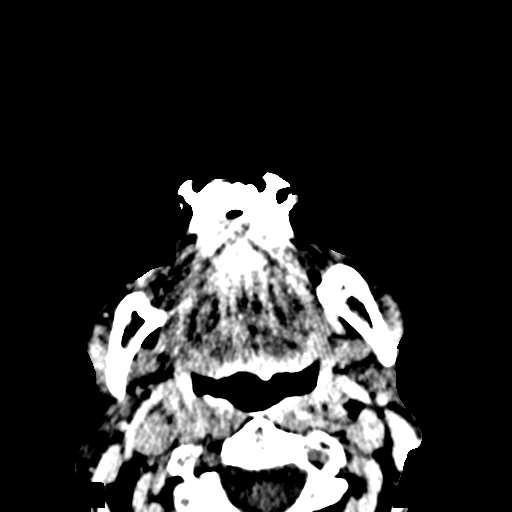
[im 23/77  brain]
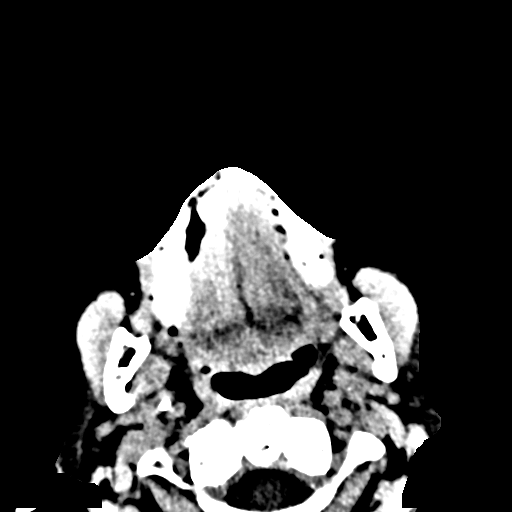
[im 35/77  brain]
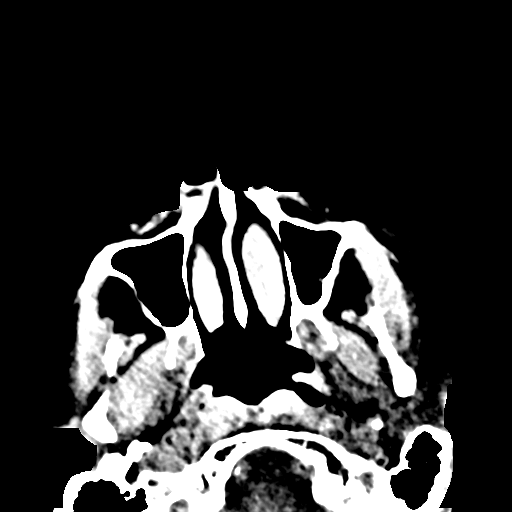
[im 42/77  brain]
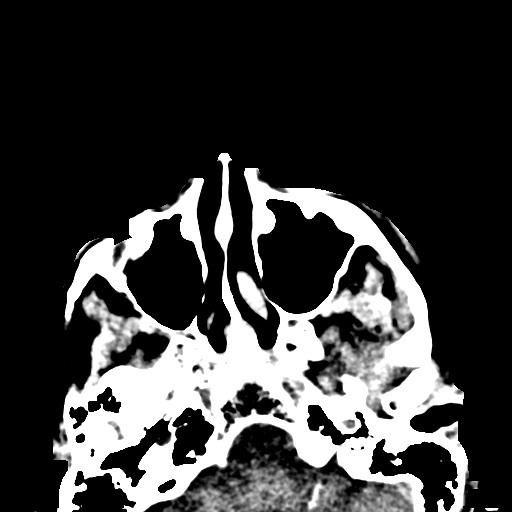

[Series 10: coronal soft · coronal · 0.34mm/px · 3 of 85 slices shown]
[im 17/85  bone]
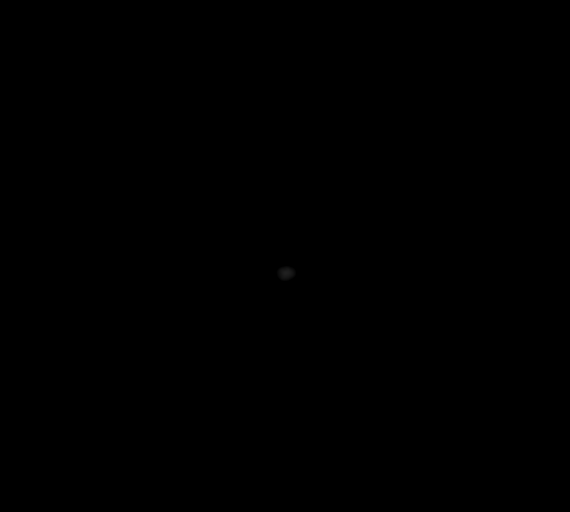
[im 34/85  bone]
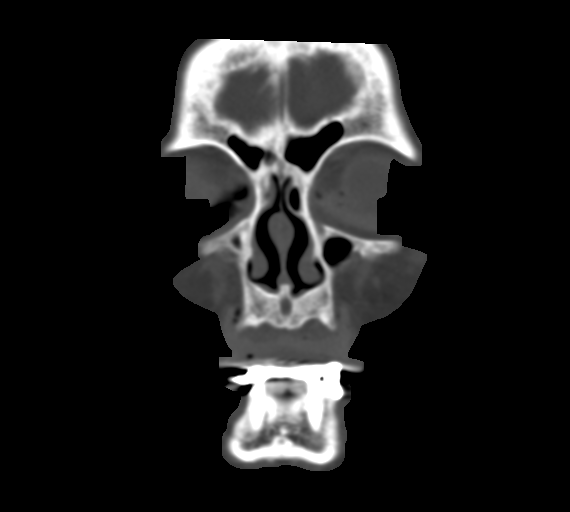
[im 51/85  bone]
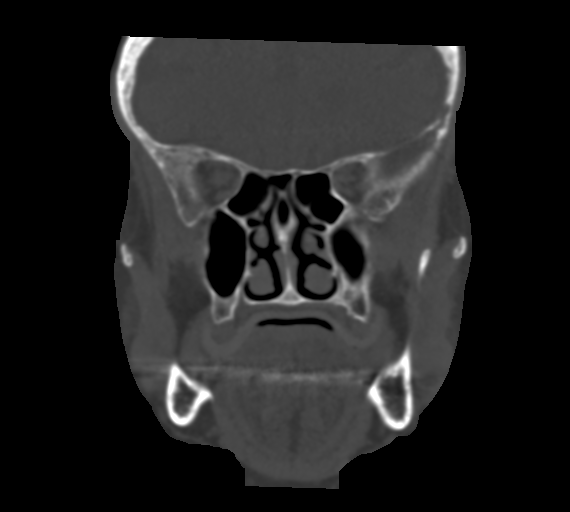

[16 of 47 positions shown; findings below may reference images not displayed]

FINDINGS: CT HEAD FINDINGS

Brain: No intracranial hemorrhage. No parenchymal contusion. No
midline shift or mass effect. Basilar cisterns are patent. No skull
base fracture. No fluid in the paranasal sinuses or mastoid air
cells. Orbits are normal.

There are periventricular and subcortical white matter
hypodensities. Generalized cortical atrophy.

Vascular: No hyperdense vessel or unexpected calcification.

Skull: Normal. Negative for fracture or focal lesion.

Sinuses/Orbits: Paranasal sinuses and mastoid air cells are clear.
Orbits are clear.

Other: None.

CT MAXILLOFACIAL FINDINGS

Osseous: orbital walls are intact. Zygomatic arches are intact.
There is mild fragmentation of the nasal bone which is likely
chronic. Maxillary sinus walls are intact. Pterygoid plates are
normal.

Mandibular condyles are located.  No evidence mandible fracture.

Orbits: Globes are intact. Intraconal contents are normal. No
proptosis.

Sinuses: No fluid in the paranasal sinuses suggest trauma.

Soft tissues: Negative
IMPRESSION: 1. No intracranial trauma.
2. Atrophy and white matter microvascular disease.
3. No acute facial bone fracture.

## 2018-01-16 IMAGING — DX DG KNEE COMPLETE 4+V*L*
4 series · 4 of 4 positions shown · non-contrast
Comparison: None.

CLINICAL DATA: Status post fall with left knee pain.

EXAM:
LEFT KNEE - COMPLETE 4+ VIEW

[knee ap (1 of 3)]
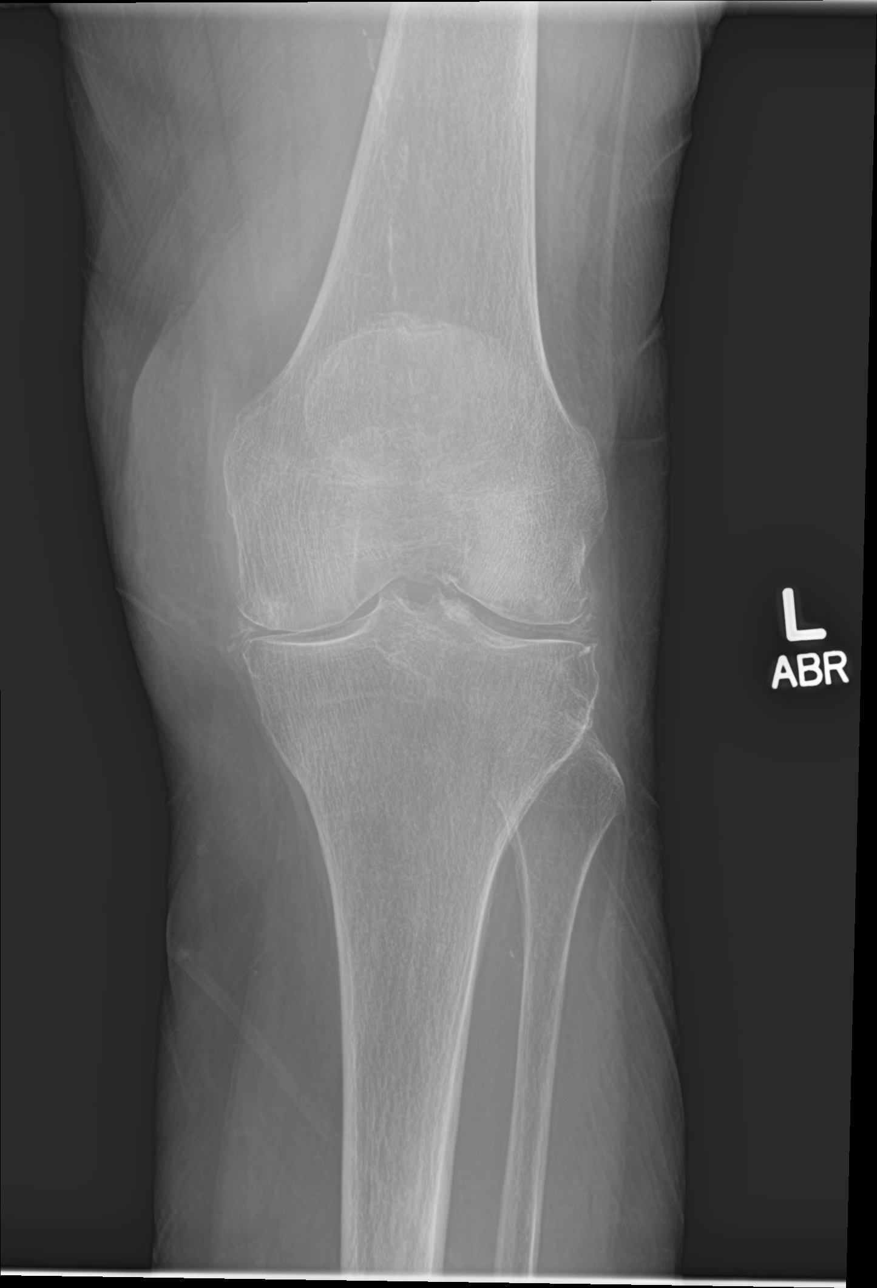

[knee lat]
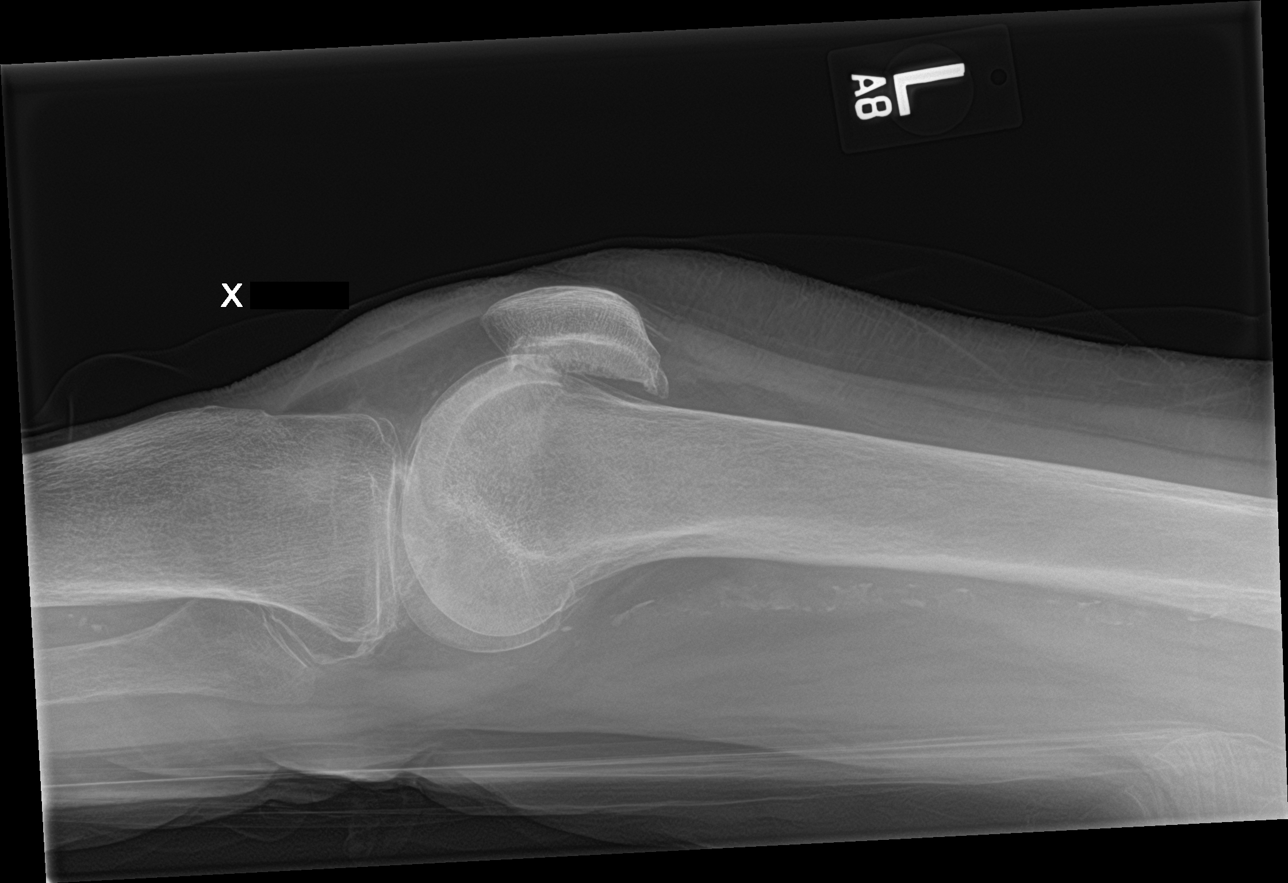

[knee ap (2 of 3)]
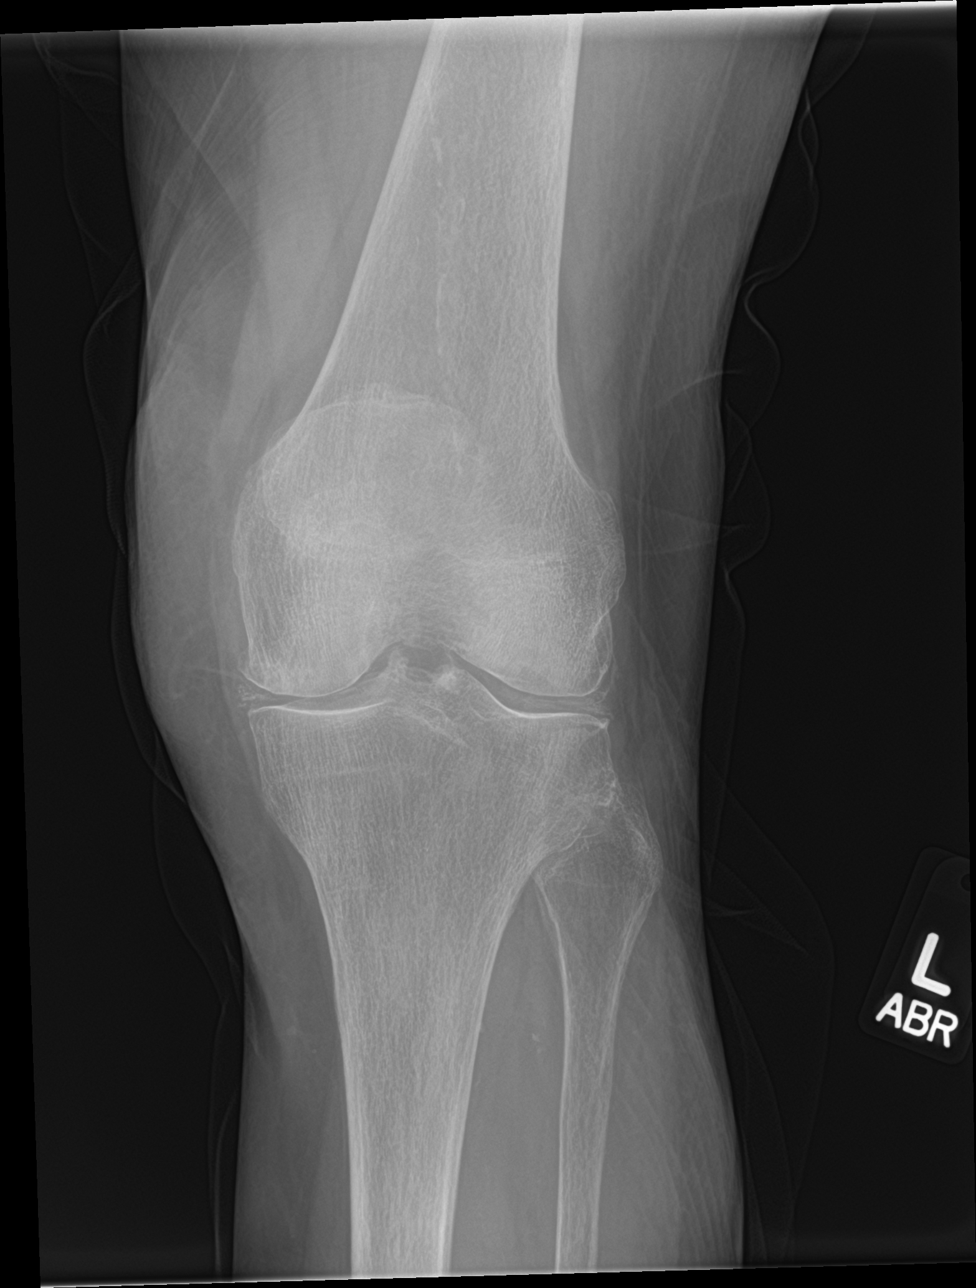

[knee ap (3 of 3)]
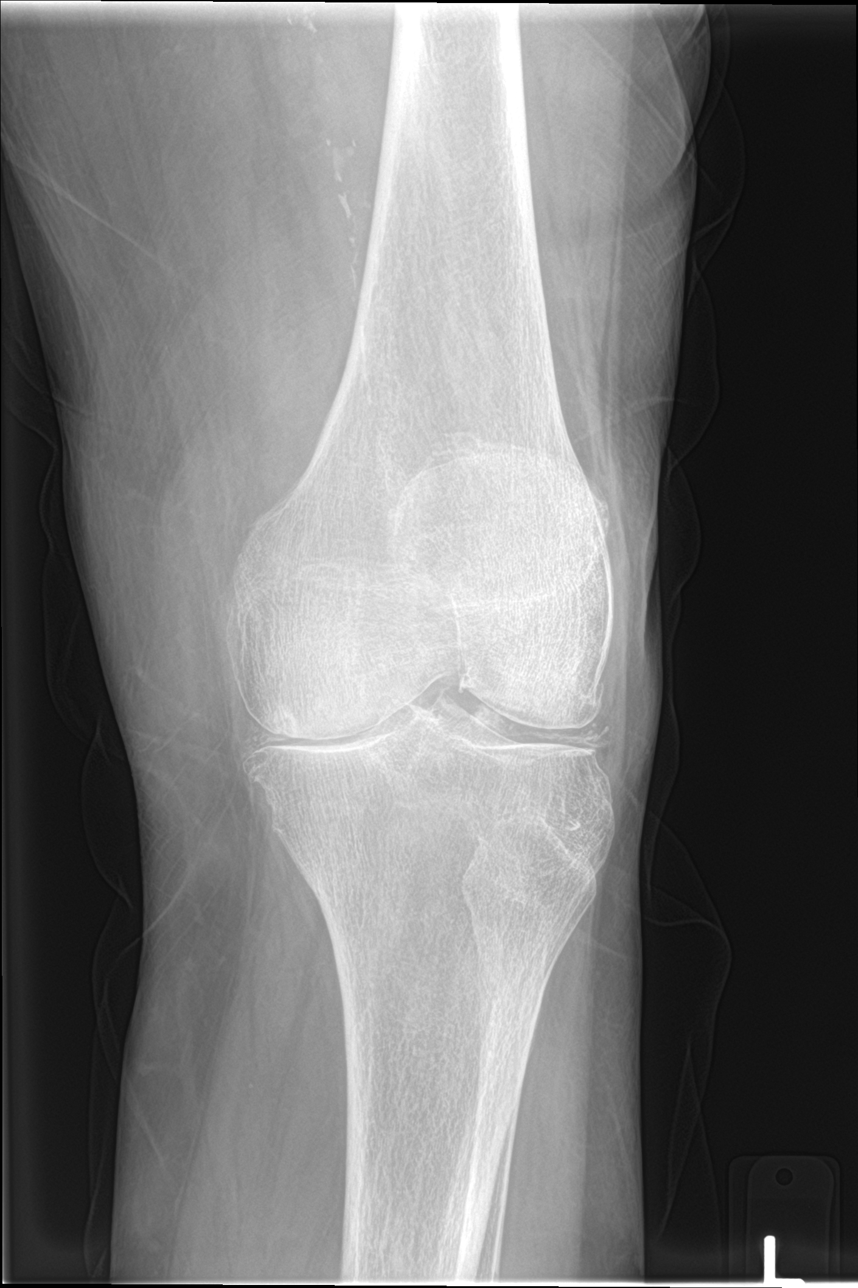

[4 of 4 positions shown; findings below may reference images not displayed]

FINDINGS: No evidence of fracture, dislocation, or joint effusion. There are
decreased femoral tibial joint space. Chondrocalcinosis is
identified. Soft tissues are unremarkable.
IMPRESSION: No acute abnormality identified.

## 2018-01-20 LAB — PROTIME-INR
INR: 2.3 — AB
Prothrombin Time: 24.5 s — ABNORMAL HIGH (ref 9.0–11.5)

## 2018-01-20 NOTE — Progress Notes (Signed)
Cardiology Office Note   Date:  01/21/2018   ID:  Courtney [name], DOB [birth year], MRN [id number]  PCP:  Caren Macadam, MD  Cardiologist:   Kate Sable   No chief complaint on file.     History of Present Illness: Courtney [name] is a 84 y.o. female who presents for consultation regarding mitral valve disease and chronic atrial fibrillation  She has a history of CVA, HTN, vascular dementia MVR and chronic afib. MVR around 2002 done at [facility] in [place] by Dr Leeroy Bock . CHADS2VASD is 6. Echo ordered by Dr Bronson Ing 2017 preop hip fracture reviewed  EF 60-65% AV sclerosis St Jude mechanical MV normal function  Severe biatrial enlargement  Estimated PA pressure 65 mmHg   Last INR 01/19/18 Rx at 2.3 appears that Dr Mannie Stabile has been following INR  Her notes indicate she had cardio embolic stroke on coumadin alone and she Has been maintained on plavix and coumadin since  Son takes care of here Both concerned about lack of primary care f/u with Dr Mannie Stabile leaving Told them we could follow her INR / coumadin and refill her heart meds but not opioids on non cardiac meds  She ambulates with walker in house and cane outside.    Past Medical History:  Diagnosis Date  . Biatrial enlargement 12/01/2015  . Chronic anticoagulation    coumadin, CHADS2VASC=6  . History of mitral valve replacement   . Hypertension   . Hypothyroidism   . Osteoporosis    on Boniva  . Permanent atrial fibrillation ([place])   . Pulmonary hypertension, moderate to severe ([place]) 12/01/2015  . Stroke [facility])    on Clopidrigel  . UTI (lower urinary tract infection) 07/2015   n&v; AKI  . Vascular dementia     Past Surgical History:  Procedure Laterality Date  . ABDOMINAL HYSTERECTOMY    . FRACTURE SURGERY  [id number]   Operative repair with intramedullary nail intratrochanteric Left Hip fracture  . INTRAMEDULLARY (IM) NAIL INTERTROCHANTERIC Left 12/01/2015   Procedure: INTRAMEDULLARY (IM) NAIL  INTERTROCHANTRIC;  Surgeon: Carole Civil, MD;  Location: [facility];  Service: Orthopedics;  Laterality: Left;  . MITRAL VALVE REPLACEMENT       Current Outpatient Medications  Medication Sig Dispense Refill  . acetaminophen (TYLENOL) 500 MG tablet Take 500 mg by mouth every 6 (six) hours as needed for mild pain.    Marland Kitchen amLODipine (NORVASC) 2.5 MG tablet Take 1 tablet (2.5 mg total) by mouth daily. 90 tablet 3  . atenolol (TENORMIN) 25 MG tablet Take 12.5 mg by mouth daily.     Marland Kitchen b complex vitamins tablet Take 1 tablet by mouth daily.    . clopidogrel (PLAVIX) 75 MG tablet TAKE 1 TABLET BY MOUTH EVERY DAY 60 tablet 2  . ibandronate (BONIVA) 150 MG tablet Take 150 mg by mouth every 30 (thirty) days. Take in the morning with a full glass of water, on an empty stomach, and do not take anything else by mouth or lie down for the next 30 min.    Marland Kitchen levothyroxine (SYNTHROID, LEVOTHROID) 100 MCG tablet Take 1 tablet (100 mcg total) by mouth daily. 90 tablet 1  . lisinopril (PRINIVIL,ZESTRIL) 2.5 MG tablet     . triamterene-hydrochlorothiazide (DYAZIDE) 37.5-25 MG capsule Take 1 each (1 capsule total) by mouth every other day. 30 capsule 2  . vitamin C (ASCORBIC ACID) 250 MG tablet Take 250 mg by mouth daily.     . Vitamin D, Ergocalciferol, (DRISDOL)  50000 units CAPS capsule Take 1 capsule (50,000 Units total) by mouth every 7 (seven) days. 12 capsule 0  . VITAMIN D, ERGOCALCIFEROL, PO Take 250 mg by mouth daily.    Marland Kitchen warfarin (COUMADIN) 3 MG tablet Take one pill on Monday night, Tuesday night, Wednesday night, Thursday night, and Friday night. 64 tablet 0  . warfarin (COUMADIN) 4 MG tablet Take one pill on Saturday night and one pill on Sunday night each week. (Patient taking differently: 3 mg. Take one pill on Saturday night and one pill on Sunday night each week.) 24 tablet 0   No current facility-administered medications for this visit.     Allergies:   Penicillins    Social History:  The  patient  reports that she has never smoked. She has never used smokeless tobacco. She reports that she does not drink alcohol or use drugs.   Family History:  The patient's family history includes Heart disease in her father and mother.    ROS:  Please see the history of present illness.   Otherwise, review of systems are positive for none.   All other systems are reviewed and negative.    PHYSICAL EXAM: VS:  BP 116/72 (BP Location: Left Arm)   Pulse 78   Ht 5\' 3"  (1.6 m)   Wt 119 lb (54 kg)   SpO2 92%   BMI 21.08 kg/m  , BMI Body mass index is 21.08 kg/m. Affect appropriate Healthy:  appears stated age 36: normal Neck supple with no adenopathy JVP normal no bruits no thyromegaly Lungs clear with no wheezing and good diaphragmatic motion Heart:  S1/S2 no murmur, no rub, gallop or click PMI normal Abdomen: benighn, BS positve, no tenderness, no AAA no bruit.  No HSM or HJR Distal pulses intact with no bruits No edema Neuro non-focal Skin warm and dry No muscular weakness    EKG:  11/29/15 afib rate 66 LAD    Recent Labs: 05/20/2017: Hemoglobin 12.9; Platelets 203 09/01/2017: BUN 22; Creat 0.85; Potassium 3.9; Sodium 139 10/30/2017: TSH 2.64    Lipid Panel No results found for: CHOL, TRIG, HDL, CHOLHDL, VLDL, LDLCALC, LDLDIRECT    Wt Readings from Last 3 Encounters:  01/21/18 119 lb (54 kg)  10/30/17 122 lb 12 oz (55.7 kg)  09/09/17 117 lb (53.1 kg)      Other studies Reviewed: Additional studies/ records that were reviewed today include: notes from Dr Bronson Ing 2017 TTE notes from primary labs .    ASSESSMENT AND PLAN:  1.  MVR: normal exam no symptoms and echo with normal function 2017 [facility] 2. Anticoagulation INR Rx will arrange f/u INR with Edrick Oh in 4 weeks  3. Chronic Afib ECG today with good rate control INR Rx 4. CVA distant functional ambulates with cane maintain plavix with coumadin 5. HTN: Well controlled.  Continue current medications and  low sodium Dash type diet.   6. Thyroid continue replacement TSH normal 10/30/17    Current medicines are reviewed at length with the patient today.  The patient does not have concerns regarding medicines.  The following changes have been made:  no change  Labs/ tests ordered today include: Refer to coumadin clinic   Orders Placed This Encounter  Procedures  . EKG 12-Lead     Disposition:   FU with Dr Bronson Ing in 6 months  Establish with coumadin clinic     Signed, Jenkins Rouge, MD  01/21/2018 3:54 PM    [facility]  [address] [place], [place]  [address] Phone: [phone]; Fax: [phone]

## 2018-01-21 ENCOUNTER — Ambulatory Visit: Payer: Medicare Other | Admitting: Cardiovascular Disease

## 2018-01-21 ENCOUNTER — Encounter: Payer: Self-pay | Admitting: Cardiovascular Disease

## 2018-01-21 VITALS — BP 116/72 | HR 78 | Ht 63.0 in | Wt 119.0 lb

## 2018-01-21 DIAGNOSIS — I340 Nonrheumatic mitral (valve) insufficiency: Secondary | ICD-10-CM | POA: Diagnosis not present

## 2018-01-21 DIAGNOSIS — I482 Chronic atrial fibrillation, unspecified: Secondary | ICD-10-CM

## 2018-01-21 NOTE — Telephone Encounter (Signed)
Tried to call patient to advise of recent INR results. Line busy. Will try again later.

## 2018-01-21 NOTE — Patient Instructions (Signed)
Medication Instructions:  Your physician recommends that you continue on your current medications as directed. Please refer to the Current Medication list given to you today.   Labwork: NONE  Testing/Procedures: NONE  Follow-Up: Your physician recommends that you schedule a follow-up appointment in: [place] wants you to follow-up in: [place]. Bronson Ing.  You will receive a reminder letter in the mail two months in advance. If you don't receive a letter, please call our office to schedule the follow-up appointment.    Any Other Special Instructions Will Be Listed Below (If Applicable).     If you need a refill on your cardiac medications before your next appointment, please call your pharmacy.

## 2018-01-21 NOTE — Telephone Encounter (Signed)
Called patient. Line busy. Will try again later.

## 2018-01-29 ENCOUNTER — Other Ambulatory Visit: Payer: Self-pay | Admitting: Family Medicine

## 2018-02-02 ENCOUNTER — Other Ambulatory Visit: Payer: Self-pay | Admitting: Family Medicine

## 2018-02-10 ENCOUNTER — Encounter: Payer: Self-pay | Admitting: Family Medicine

## 2018-02-10 ENCOUNTER — Ambulatory Visit (INDEPENDENT_AMBULATORY_CARE_PROVIDER_SITE_OTHER): Payer: Medicare Other | Admitting: Family Medicine

## 2018-02-10 ENCOUNTER — Other Ambulatory Visit: Payer: Self-pay

## 2018-02-10 VITALS — BP 130/70 | HR 60 | Temp 98.0°F | Resp 60 | Ht 63.0 in | Wt 117.1 lb

## 2018-02-10 DIAGNOSIS — I10 Essential (primary) hypertension: Secondary | ICD-10-CM

## 2018-02-10 DIAGNOSIS — I482 Chronic atrial fibrillation, unspecified: Secondary | ICD-10-CM

## 2018-02-10 DIAGNOSIS — Z23 Encounter for immunization: Secondary | ICD-10-CM | POA: Diagnosis not present

## 2018-02-10 DIAGNOSIS — M8000XS Age-related osteoporosis with current pathological fracture, unspecified site, sequela: Secondary | ICD-10-CM

## 2018-02-10 DIAGNOSIS — E039 Hypothyroidism, unspecified: Secondary | ICD-10-CM | POA: Diagnosis not present

## 2018-02-10 DIAGNOSIS — Z7901 Long term (current) use of anticoagulants: Secondary | ICD-10-CM

## 2018-02-10 LAB — BASIC METABOLIC PANEL
BUN/Creatinine Ratio: 27 (calc) — ABNORMAL HIGH (ref 6–22)
BUN: 26 mg/dL — ABNORMAL HIGH (ref 7–25)
CALCIUM: 10.2 mg/dL (ref 8.6–10.4)
CO2: 28 mmol/L (ref 20–32)
Chloride: 104 mmol/L (ref 98–110)
Creat: 0.96 mg/dL — ABNORMAL HIGH (ref 0.60–0.88)
GLUCOSE: 86 mg/dL (ref 65–139)
Potassium: 4.7 mmol/L (ref 3.5–5.3)
Sodium: 141 mmol/L (ref 135–146)

## 2018-02-10 MED ORDER — WARFARIN SODIUM 3 MG PO TABS: ORAL_TABLET | ORAL | 0 refills | Status: DC

## 2018-02-10 MED ORDER — TRIAMTERENE-HCTZ 37.5-25 MG PO CAPS: 1.0000 | ORAL_CAPSULE | ORAL | 2 refills | Status: DC

## 2018-02-10 MED ORDER — LEVOTHYROXINE SODIUM 100 MCG PO TABS: 100.0000 ug | ORAL_TABLET | Freq: Every day | ORAL | 1 refills | Status: DC

## 2018-02-10 MED ORDER — IBANDRONATE SODIUM 150 MG PO TABS: 150.0000 mg | ORAL_TABLET | ORAL | 0 refills | Status: DC

## 2018-02-10 MED ORDER — LISINOPRIL 2.5 MG PO TABS: 2.5000 mg | ORAL_TABLET | Freq: Every day | ORAL | 0 refills | Status: DC

## 2018-02-10 MED ORDER — ATENOLOL 25 MG PO TABS: 12.5000 mg | ORAL_TABLET | Freq: Every day | ORAL | 1 refills | Status: DC

## 2018-02-10 MED ORDER — AMLODIPINE BESYLATE 2.5 MG PO TABS: 2.5000 mg | ORAL_TABLET | Freq: Every day | ORAL | 3 refills | Status: DC

## 2018-02-10 MED ORDER — CLOPIDOGREL BISULFATE 75 MG PO TABS: 75.0000 mg | ORAL_TABLET | Freq: Every day | ORAL | 1 refills | Status: DC

## 2018-02-10 NOTE — Progress Notes (Signed)
Patient ID: Courtney [name], female    DOB: 1929/07/09, 84 y.o.   MRN: [id number]  Chief Complaint  Patient presents with  . Hypertension    Allergies Penicillins  Subjective:   Courtney [name] is a 84 y.o. female who presents to [facility] today.  HPI Courtney [name] presents today with her son for follow-up visit.  She reports that she has been doing very well.  She has no complaints today.  Since she was last seen here her husband has passed away.  She is still living with her son and he is her primary caretaker.  Her appetite is good.  Energy is good.  Still exercising 3 days a week.  No recent falls.  Taking her blood pressure medications as directed.  Is still on Coumadin and Plavix for valve replacement, atrial fibrillation, and stroke secondary to A. fib.  Bowel movements are normal.  Energy is good.  Sleeping well.  Denies any trouble with her mood.  Taking all medications as directed.  Medications are given by her son.  Was recently seen by cardiology and he referred her to the Coumadin clinic for monitoring of her PT/INR.  She is getting all her labs done there.  They do request refills of her medications.  Her next visit will be with her new PCP Dr. Morrison Old she is scheduled for August.  She is due to get electrolytes done today.  Her thyroid was recently checked.  She is not missing any doses.  Denies any skin lesions.  Still wishes to continue Boniva.  Does not want to take any vitamin D because her son reports that it causes her side effects.   Past Medical History:  Diagnosis Date  . Biatrial enlargement 12/01/2015  . Chronic anticoagulation    coumadin, CHADS2VASC=6  . History of mitral valve replacement   . Hypertension   . Hypothyroidism   . Osteoporosis    on Boniva  . Permanent atrial fibrillation ([place])   . Pulmonary hypertension, moderate to severe ([place]) 12/01/2015  . Stroke [facility])    on Clopidrigel  . UTI (lower urinary tract infection) 07/2015   n&v; AKI  .  Vascular dementia     Past Surgical History:  Procedure Laterality Date  . ABDOMINAL HYSTERECTOMY    . FRACTURE SURGERY  [id number]   Operative repair with intramedullary nail intratrochanteric Left Hip fracture  . INTRAMEDULLARY (IM) NAIL INTERTROCHANTERIC Left 12/01/2015   Procedure: INTRAMEDULLARY (IM) NAIL INTERTROCHANTRIC;  Surgeon: Carole Civil, MD;  Location: [facility] ORS;  Service: Orthopedics;  Laterality: Left;  . MITRAL VALVE REPLACEMENT      Family History  Problem Relation Age of Onset  . Heart disease Mother   . Heart disease Father   . Stroke Neg Hx   . Cancer Neg Hx   . Diabetes Neg Hx      Social History   Socioeconomic History  . Marital status: Divorced    Spouse name: Not on file  . Number of children: Not on file  . Years of education: Not on file  . Highest education level: Not on file  Occupational History  . Occupation: Retired  Scientific laboratory technician  . Financial resource strain: Not on file  . Food insecurity:    Worry: Not on file    Inability: Not on file  . Transportation needs:    Medical: Not on file    Non-medical: Not on file  Tobacco Use  . Smoking status: Never Smoker  .  Smokeless tobacco: Never Used  Substance and Sexual Activity  . Alcohol use: No  . Drug use: No  . Sexual activity: Never  Lifestyle  . Physical activity:    Days per week: Not on file    Minutes per session: Not on file  . Stress: Not on file  Relationships  . Social connections:    Talks on phone: Not on file    Gets together: Not on file    Attends religious service: Not on file    Active member of club or organization: Not on file    Attends meetings of clubs or organizations: Not on file    Relationship status: Not on file  Other Topics Concern  . Not on file  Social History Narrative   Lives with her ex-husband and son when in [place]. Has a house in [place], [place].    Review of Systems  Constitutional: Negative for activity change, appetite change and  fever.  HENT: Negative for trouble swallowing and voice change.   Eyes: Negative for visual disturbance.  Respiratory: Negative for cough, chest tightness and shortness of breath.   Cardiovascular: Negative for chest pain, palpitations and leg swelling.  Gastrointestinal: Negative for abdominal pain, nausea and vomiting.  Genitourinary: Negative for dysuria, frequency and urgency.  Neurological: Negative for dizziness, tremors, syncope and light-headedness.  Hematological: Negative for adenopathy.     Objective:   BP 130/70 (BP Location: Left Arm, Patient Position: Sitting, Cuff Size: Normal)   Pulse 60   Temp 98 F (36.7 C) (Temporal)   Resp (!) 60   Ht 5\' 3"  (1.6 m)   Wt 117 lb 1.3 oz (53.1 kg)   BMI 20.74 kg/m   Physical Exam  Constitutional: She is oriented to person, place, and time. She appears well-developed and well-nourished. No distress.  HENT:  Head: Normocephalic and atraumatic.  Eyes: Pupils are equal, round, and reactive to light. Conjunctivae and EOM are normal.  Neck: Normal range of motion. Neck supple. No tracheal deviation present. No thyromegaly present.  Cardiovascular: Normal rate.  Murmur heard. Pulmonary/Chest: Effort normal and breath sounds normal.  Abdominal: Soft. Bowel sounds are normal.  Neurological: She is alert and oriented to person, place, and time.  Skin: Skin is warm and dry. Capillary refill takes less than 2 seconds.  Psychiatric: She has a normal mood and affect. Her behavior is normal.   Assessment and Plan  1. Essential hypertension Stable.  Continue current medications.  Check BMP today. - amLODipine (NORVASC) 2.5 MG tablet; Take 1 tablet (2.5 mg total) by mouth daily.  Dispense: 90 tablet; Refill: 3 - triamterene-hydrochlorothiazide (DYAZIDE) 37.5-25 MG capsule; Take 1 each (1 capsule total) by mouth every other day.  Dispense: 30 capsule; Refill: 2 - lisinopril (PRINIVIL,ZESTRIL) 2.5 MG tablet; Take 1 tablet (2.5 mg total) by  mouth daily.  Dispense: 90 tablet; Refill: 0 - Basic metabolic panel  2. Chronic atrial fibrillation (HCC) Continue follow-up with cardiology.  Continue medications. - atenolol (TENORMIN) 25 MG tablet; Take 0.5 tablets (12.5 mg total) by mouth daily.  Dispense: 45 tablet; Refill: 1 - clopidogrel (PLAVIX) 75 MG tablet; Take 1 tablet (75 mg total) by mouth daily.  Dispense: 90 tablet; Refill: 1  3. Chronic anticoagulation Will refill Coumadin today.  However, discussed with son that all future refills for Coumadin should be done by the Coumadin clinic.  Labs per Coumadin clinic.  Son reports that she has been seen in the Coumadin clinic and has been getting her  labs there. - warfarin (COUMADIN) 3 MG tablet; Take one po qd  Dispense: 30 tablet; Refill: 0  4. Hypothyroidism, unspecified type Continue current dosing. - levothyroxine (SYNTHROID, LEVOTHROID) 100 MCG tablet; Take 1 tablet (100 mcg total) by mouth daily.  Dispense: 90 tablet; Refill: 1  5. Osteoporosis with current pathological fracture, unspecified osteoporosis type, sequela Family wishes to continue medication at this time.  Defers bone mineral density testing.  Calcium and vitamin D recommended. - ibandronate (BONIVA) 150 MG tablet; Take 1 tablet (150 mg total) by mouth every 30 (thirty) days. Take in the morning with a full glass of water, on an empty stomach, and do not take anything else by mouth or lie down for the next 30 min.  Dispense: 3 tablet; Refill: 0  6. Immunization due Vaccination given. - Pneumococcal conjugate vaccine 13-valent IM  Follow-up with new PCP/Dr.Goshrani in 1 month. No follow-ups on file. Caren Macadam, MD 02/10/2018

## 2018-02-10 NOTE — Patient Instructions (Signed)
Has follow up appointment with Dr. Morrison Old on 03/25/2018. Is now being seen at the Coumadin clinic

## 2018-02-11 ENCOUNTER — Encounter: Payer: Self-pay | Admitting: Family Medicine

## 2018-03-11 ENCOUNTER — Ambulatory Visit (INDEPENDENT_AMBULATORY_CARE_PROVIDER_SITE_OTHER): Payer: Medicare Other | Admitting: *Deleted

## 2018-03-11 DIAGNOSIS — I482 Chronic atrial fibrillation, unspecified: Secondary | ICD-10-CM

## 2018-03-11 DIAGNOSIS — Z5181 Encounter for therapeutic drug level monitoring: Secondary | ICD-10-CM

## 2018-03-11 DIAGNOSIS — Z952 Presence of prosthetic heart valve: Secondary | ICD-10-CM

## 2018-03-11 DIAGNOSIS — I639 Cerebral infarction, unspecified: Secondary | ICD-10-CM | POA: Diagnosis not present

## 2018-03-11 LAB — POCT INR: INR: 2.5 (ref 2.0–3.0)

## 2018-03-11 NOTE — Patient Instructions (Signed)
Increase coumadin to 3mg  daily except 5mg  on Wednesdays Recheck INR on 8/28 in [place]

## 2018-03-26 ENCOUNTER — Other Ambulatory Visit: Payer: Self-pay | Admitting: Family Medicine

## 2018-03-31 ENCOUNTER — Other Ambulatory Visit: Payer: Self-pay | Admitting: Family Medicine

## 2018-04-02 ENCOUNTER — Ambulatory Visit (INDEPENDENT_AMBULATORY_CARE_PROVIDER_SITE_OTHER): Payer: Medicare Other | Admitting: *Deleted

## 2018-04-02 DIAGNOSIS — Z5181 Encounter for therapeutic drug level monitoring: Secondary | ICD-10-CM

## 2018-04-02 DIAGNOSIS — Z952 Presence of prosthetic heart valve: Secondary | ICD-10-CM | POA: Diagnosis not present

## 2018-04-02 DIAGNOSIS — I639 Cerebral infarction, unspecified: Secondary | ICD-10-CM

## 2018-04-02 DIAGNOSIS — I482 Chronic atrial fibrillation, unspecified: Secondary | ICD-10-CM

## 2018-04-02 LAB — POCT INR: INR: 1.9 — AB (ref 2.0–3.0)

## 2018-04-02 NOTE — Patient Instructions (Signed)
Son has been giving pt 3mg  daily except 4mg  on Wednesdays Take 5mg  tonight and tomorrow night then increase dose to 3mg  daily except 5mg  on Wednesdays Recheck in 2 weeks

## 2018-04-21 ENCOUNTER — Ambulatory Visit (INDEPENDENT_AMBULATORY_CARE_PROVIDER_SITE_OTHER): Payer: Medicare Other | Admitting: *Deleted

## 2018-04-21 DIAGNOSIS — I639 Cerebral infarction, unspecified: Secondary | ICD-10-CM

## 2018-04-21 DIAGNOSIS — I482 Chronic atrial fibrillation, unspecified: Secondary | ICD-10-CM

## 2018-04-21 DIAGNOSIS — Z5181 Encounter for therapeutic drug level monitoring: Secondary | ICD-10-CM

## 2018-04-21 DIAGNOSIS — Z952 Presence of prosthetic heart valve: Secondary | ICD-10-CM

## 2018-04-21 LAB — POCT INR: INR: 3 (ref 2.0–3.0)

## 2018-04-21 NOTE — Patient Instructions (Signed)
Continue coumadin 3mg  daily except 5mg  on Wednesdays Recheck in 3 weeks

## 2018-05-12 ENCOUNTER — Ambulatory Visit (INDEPENDENT_AMBULATORY_CARE_PROVIDER_SITE_OTHER): Payer: Medicare Other | Admitting: *Deleted

## 2018-05-12 DIAGNOSIS — I482 Chronic atrial fibrillation, unspecified: Secondary | ICD-10-CM | POA: Diagnosis not present

## 2018-05-12 DIAGNOSIS — Z952 Presence of prosthetic heart valve: Secondary | ICD-10-CM | POA: Diagnosis not present

## 2018-05-12 DIAGNOSIS — Z5181 Encounter for therapeutic drug level monitoring: Secondary | ICD-10-CM | POA: Diagnosis not present

## 2018-05-12 DIAGNOSIS — I639 Cerebral infarction, unspecified: Secondary | ICD-10-CM

## 2018-05-12 LAB — POCT INR: INR: 2.6 (ref 2.0–3.0)

## 2018-05-12 NOTE — Patient Instructions (Signed)
Decrease coumadin to 3mg  daily except 4mg  on Wednesdays Recheck in 3 weeks

## 2018-05-13 ENCOUNTER — Observation Stay (HOSPITAL_COMMUNITY)
Admission: EM | Admit: 2018-05-13 | Discharge: 2018-05-15 | Disposition: A | Discharge status: Skilled Nursing Facility | Payer: Medicare Other | Attending: Family Medicine | Admitting: Family Medicine

## 2018-05-13 ENCOUNTER — Emergency Department (HOSPITAL_COMMUNITY): Payer: Medicare Other

## 2018-05-13 ENCOUNTER — Encounter (HOSPITAL_COMMUNITY): Payer: Self-pay

## 2018-05-13 DIAGNOSIS — I10 Essential (primary) hypertension: Secondary | ICD-10-CM | POA: Diagnosis not present

## 2018-05-13 DIAGNOSIS — Z952 Presence of prosthetic heart valve: Secondary | ICD-10-CM | POA: Insufficient documentation

## 2018-05-13 DIAGNOSIS — Z880 Allergy status to penicillin: Secondary | ICD-10-CM | POA: Diagnosis not present

## 2018-05-13 DIAGNOSIS — Z8249 Family history of ischemic heart disease and other diseases of the circulatory system: Secondary | ICD-10-CM | POA: Diagnosis not present

## 2018-05-13 DIAGNOSIS — I272 Pulmonary hypertension, unspecified: Secondary | ICD-10-CM | POA: Insufficient documentation

## 2018-05-13 DIAGNOSIS — I444 Left anterior fascicular block: Secondary | ICD-10-CM | POA: Insufficient documentation

## 2018-05-13 DIAGNOSIS — M810 Age-related osteoporosis without current pathological fracture: Secondary | ICD-10-CM | POA: Diagnosis not present

## 2018-05-13 DIAGNOSIS — E039 Hypothyroidism, unspecified: Secondary | ICD-10-CM | POA: Diagnosis not present

## 2018-05-13 DIAGNOSIS — Z8673 Personal history of transient ischemic attack (TIA), and cerebral infarction without residual deficits: Secondary | ICD-10-CM | POA: Diagnosis not present

## 2018-05-13 DIAGNOSIS — S42292A Other displaced fracture of upper end of left humerus, initial encounter for closed fracture: Secondary | ICD-10-CM | POA: Diagnosis present

## 2018-05-13 DIAGNOSIS — Z7901 Long term (current) use of anticoagulants: Secondary | ICD-10-CM | POA: Insufficient documentation

## 2018-05-13 DIAGNOSIS — M47812 Spondylosis without myelopathy or radiculopathy, cervical region: Secondary | ICD-10-CM | POA: Insufficient documentation

## 2018-05-13 DIAGNOSIS — S42201A Unspecified fracture of upper end of right humerus, initial encounter for closed fracture: Secondary | ICD-10-CM | POA: Diagnosis present

## 2018-05-13 DIAGNOSIS — S0511XA Contusion of eyeball and orbital tissues, right eye, initial encounter: Secondary | ICD-10-CM | POA: Insufficient documentation

## 2018-05-13 DIAGNOSIS — F0150 Vascular dementia without behavioral disturbance: Secondary | ICD-10-CM | POA: Diagnosis not present

## 2018-05-13 DIAGNOSIS — Z7902 Long term (current) use of antithrombotics/antiplatelets: Secondary | ICD-10-CM | POA: Diagnosis not present

## 2018-05-13 DIAGNOSIS — W19XXXA Unspecified fall, initial encounter: Secondary | ICD-10-CM | POA: Diagnosis not present

## 2018-05-13 DIAGNOSIS — S022XXA Fracture of nasal bones, initial encounter for closed fracture: Secondary | ICD-10-CM | POA: Insufficient documentation

## 2018-05-13 DIAGNOSIS — Z79899 Other long term (current) drug therapy: Secondary | ICD-10-CM | POA: Diagnosis not present

## 2018-05-13 DIAGNOSIS — R296 Repeated falls: Secondary | ICD-10-CM

## 2018-05-13 DIAGNOSIS — S42291A Other displaced fracture of upper end of right humerus, initial encounter for closed fracture: Secondary | ICD-10-CM | POA: Diagnosis not present

## 2018-05-13 DIAGNOSIS — I4821 Permanent atrial fibrillation: Secondary | ICD-10-CM | POA: Insufficient documentation

## 2018-05-13 DIAGNOSIS — H05231 Hemorrhage of right orbit: Secondary | ICD-10-CM | POA: Diagnosis present

## 2018-05-13 DIAGNOSIS — E538 Deficiency of other specified B group vitamins: Secondary | ICD-10-CM | POA: Diagnosis not present

## 2018-05-13 DIAGNOSIS — M2500 Hemarthrosis, unspecified joint: Secondary | ICD-10-CM

## 2018-05-13 LAB — BASIC METABOLIC PANEL
Anion gap: 8 (ref 5–15)
BUN: 24 mg/dL — AB (ref 8–23)
CO2: 26 mmol/L (ref 22–32)
Calcium: 10.4 mg/dL — ABNORMAL HIGH (ref 8.9–10.3)
Chloride: 105 mmol/L (ref 98–111)
Creatinine, Ser: 0.98 mg/dL (ref 0.44–1.00)
GFR calc Af Amer: 58 mL/min — ABNORMAL LOW (ref >60)
GFR, EST NON AFRICAN AMERICAN: 50 mL/min — AB (ref >60)
Glucose, Bld: 114 mg/dL — ABNORMAL HIGH (ref 70–99)
POTASSIUM: 3.6 mmol/L (ref 3.5–5.1)
SODIUM: 139 mmol/L (ref 135–145)

## 2018-05-13 LAB — CBC WITH DIFFERENTIAL/PLATELET
ABS IMMATURE GRANULOCYTES: 0.06 10*3/uL (ref 0.00–0.07)
BASOS PCT: 0 %
Basophils Absolute: 0 10*3/uL (ref 0.0–0.1)
EOS ABS: 0.1 10*3/uL (ref 0.0–0.5)
EOS PCT: 1 %
HCT: 41.4 % (ref 36.0–46.0)
Hemoglobin: 12.6 g/dL (ref 12.0–15.0)
Immature Granulocytes: 1 %
Lymphocytes Relative: 8 %
Lymphs Abs: 0.7 10*3/uL (ref 0.7–4.0)
MCH: 27 pg (ref 26.0–34.0)
MCHC: 30.4 g/dL (ref 30.0–36.0)
MCV: 88.8 fL (ref 80.0–100.0)
MONO ABS: 0.6 10*3/uL (ref 0.1–1.0)
Monocytes Relative: 7 %
NEUTROS ABS: 6.9 10*3/uL (ref 1.7–7.7)
Neutrophils Relative %: 83 %
PLATELETS: 184 10*3/uL (ref 150–400)
RBC: 4.66 MIL/uL (ref 3.87–5.11)
RDW: 14.1 % (ref 11.5–15.5)
WBC: 8.2 10*3/uL (ref 4.0–10.5)
nRBC: 0 % (ref 0.0–0.2)

## 2018-05-13 LAB — TYPE AND SCREEN
ABO/RH(D): A POS
Antibody Screen: NEGATIVE

## 2018-05-13 LAB — PROTIME-INR
INR: 2.36
Prothrombin Time: 25.6 seconds — ABNORMAL HIGH (ref 11.4–15.2)

## 2018-05-13 LAB — I-STAT TROPONIN, ED: Troponin i, poc: 0.01 ng/mL (ref 0.00–0.08)

## 2018-05-13 LAB — ABO/RH: ABO/RH(D): A POS

## 2018-05-13 MED ORDER — FENTANYL CITRATE (PF) 100 MCG/2ML IJ SOLN
100.0000 ug | Freq: Once | INTRAMUSCULAR | Status: AC
  Administered 2018-05-13: 100 ug via INTRAVENOUS
  Filled 2018-05-13: qty 2

## 2018-05-13 NOTE — ED Notes (Signed)
Son called, Valentina Alcoser [phone]), wanted to give the staff medical HX and would like an update on the pts status.

## 2018-05-13 NOTE — ED Notes (Signed)
Pt transported to imaging.

## 2018-05-13 NOTE — ED Triage Notes (Signed)
Pt coming from home by ems due to a fall. Pt fell hit her left orbital and has some obvious deformity of the left shoulder. Pt denies LOC. Pt is on coumadin. Pt has hx of falls, took to much about a month a go and fell had a hemorrhage and has dementia from the brain bleed.

## 2018-05-13 NOTE — ED Provider Notes (Signed)
[facility] EMERGENCY DEPARTMENT Provider Note   CSN: [id number] Arrival date & time: 05/13/18  2136     History   Chief Complaint Chief Complaint  Patient presents with  . Fall    HPI Courtney [name] is a 84 y.o. female by EMS for evaluation of fall.  Per EMS, patient is on Coumadin.  They report that she fell and landed on her right side.  EMS reports no LOC.  Patient has a history of falling.  They report that a few months ago, she took too much of her Coumadin and fell, causing an intracranial hemorrhage which left her with some residual dementia.  Patient denies any preceding chest pain, dizziness.   EM LEVEL 5 CAVEAT DUE TO DEMENTIA  The history is provided by the patient.    Past Medical History:  Diagnosis Date  . Biatrial enlargement 12/01/2015  . Chronic anticoagulation    coumadin, CHADS2VASC=6  . History of mitral valve replacement   . Hypertension   . Hypothyroidism   . Osteoporosis    on Boniva  . Permanent atrial fibrillation   . Pulmonary hypertension, moderate to severe ([place]) 12/01/2015  . Stroke [facility])    on Clopidrigel  . UTI (lower urinary tract infection) 07/2015   n&v; AKI  . Vascular dementia [facility])     Patient Active Problem List   Diagnosis Date Noted  . Frequent falls 05/14/2018  . Encounter for therapeutic drug monitoring 03/11/2018  . T12 compression fracture ([place]) 04/22/2017  . Dementia, multi-infarct ([place]) 04/22/2017  . Cardioembolic stroke ([place]) 32/95/1884  . B12 deficiency 04/22/2017  . Cough 12/16/2015  . Osteoporosis 12/05/2015  . Biatrial enlargement 12/01/2015  . Pulmonary hypertension, moderate to severe ([place]) 12/01/2015  . Closed fracture of intertrochanteric section of femur ([place])   . Chronic atrial fibrillation 11/30/2015  . Hypothyroidism 11/29/2015  . Thrombocytopenia ([place]) 07/13/2015  . Chronic anticoagulation 07/13/2015  . S/P mitral valve replacement 07/13/2015  . Vascular dementia, due to brain bleed  from elevated INR, not due to ischemic strokes.  07/12/2015  . Essential hypertension 07/12/2015    Past Surgical History:  Procedure Laterality Date  . ABDOMINAL HYSTERECTOMY    . FRACTURE SURGERY  [id number]   Operative repair with intramedullary nail intratrochanteric Left Hip fracture  . INTRAMEDULLARY (IM) NAIL INTERTROCHANTERIC Left 12/01/2015   Procedure: INTRAMEDULLARY (IM) NAIL INTERTROCHANTRIC;  Surgeon: Carole Civil, MD;  Location: [facility];  Service: Orthopedics;  Laterality: Left;  . MITRAL VALVE REPLACEMENT       OB History   None      Home Medications    Prior to Admission medications   Medication Sig Start Date End Date Taking? Authorizing Provider  warfarin (COUMADIN) 3 MG tablet TAKE ONE PILL ON MONDAY NIGHT, TUESDAY NIGHT, WEDNESDAY NIGHT, THURSDAY NIGHT, AND FRIDAY NIGHT. Patient taking differently: Take 3 mg by mouth See admin instructions. Everyday except Wednesday 03/31/18  Yes Caren Macadam, MD  warfarin (COUMADIN) 4 MG tablet TAKE ONE PILL ON SATURDAY NIGHT AND ONE PILL ON SUNDAY NIGHT EACH WEEK. Patient taking differently: Take 4 mg by mouth once a week. Wednesday 03/26/18  Yes Caren Macadam, MD  acetaminophen (TYLENOL) 500 MG tablet Take 500 mg by mouth every 6 (six) hours as needed for mild pain.    [provider]  amLODipine (NORVASC) 2.5 MG tablet Take 1 tablet (2.5 mg total) by mouth daily. 02/10/18   Caren Macadam, MD  atenolol (TENORMIN) 25 MG tablet Take 0.5 tablets (  12.5 mg total) by mouth daily. 02/10/18   Caren Macadam, MD  b complex vitamins tablet Take 1 tablet by mouth daily.    [provider]  clopidogrel (PLAVIX) 75 MG tablet Take 1 tablet (75 mg total) by mouth daily. 02/10/18   Caren Macadam, MD  ibandronate (BONIVA) 150 MG tablet Take 1 tablet (150 mg total) by mouth every 30 (thirty) days. Take in the morning with a full glass of water, on an empty stomach, and do not take anything else by mouth or lie down for the next  30 min. 02/10/18   Caren Macadam, MD  levothyroxine (SYNTHROID, LEVOTHROID) 100 MCG tablet Take 1 tablet (100 mcg total) by mouth daily. 02/10/18   Caren Macadam, MD  lisinopril (PRINIVIL,ZESTRIL) 2.5 MG tablet Take 1 tablet (2.5 mg total) by mouth daily. 02/10/18   Caren Macadam, MD  triamterene-hydrochlorothiazide (DYAZIDE) 37.5-25 MG capsule Take 1 each (1 capsule total) by mouth every other day. 02/10/18   Caren Macadam, MD  vitamin C (ASCORBIC ACID) 250 MG tablet Take 250 mg by mouth daily.     [provider]  VITAMIN D, ERGOCALCIFEROL, PO Take 250 mg by mouth daily.    [provider]  warfarin (COUMADIN) 3 MG tablet Take one po qd 02/10/18   Caren Macadam, MD    Family History Family History  Problem Relation Age of Onset  . Heart disease Mother   . Heart disease Father   . Stroke Neg Hx   . Cancer Neg Hx   . Diabetes Neg Hx     Social History Social History   Tobacco Use  . Smoking status: Never Smoker  . Smokeless tobacco: Never Used  Substance Use Topics  . Alcohol use: No  . Drug use: No     Allergies   Penicillins   Review of Systems Review of Systems  Unable to perform ROS: Dementia     Physical Exam Updated Vital Signs BP 130/67   Pulse 60   Temp 97.7 F (36.5 C) (Oral)   Resp 16   SpO2 94%   Physical Exam  Constitutional: She is oriented to person, place, and time. She appears well-developed and well-nourished.  HENT:  Head: Normocephalic and atraumatic.    Mouth/Throat: Oropharynx is clear and moist and mucous membranes are normal.  Eyes: Pupils are equal, round, and reactive to light. Conjunctivae, EOM and lids are normal.  Neck: Normal range of motion. Spinous process tenderness present.  Tenderness palpation noted midline cervical spine.  No deformities or crepitus noted.  Cardiovascular: Normal rate, regular rhythm, normal heart sounds and normal pulses. Exam reveals no gallop and no friction rub.  No murmur  heard. Pulses:      Radial pulses are 2+ on the right side, and 2+ on the left side.       Dorsalis pedis pulses are 2+ on the right side, and 2+ on the left side.  Pulmonary/Chest: Effort normal and breath sounds normal.  Lungs clear to auscultation bilaterally.  Symmetric chest rise.  No wheezing, rales, rhonchi.  Abdominal: Soft. Normal appearance. There is no tenderness. There is no rigidity and no guarding.  Abdomen is soft, non-distended, non-tender. No rigidity, No guarding. No peritoneal signs.  Musculoskeletal: Normal range of motion.  Palpation noted to anterior aspect of right shoulder with overlying soft tissue swelling and ecchymosis.  Notable deformity noted.  No tenderness palpation noted to right elbow, right wrist.  No tenderness palpation noted to left shoulder, left  elbow, left arm.  Neurological: She is alert and oriented to person, place, and time.  Answers questions appropriately Follows commands 5/5 strength bilateral lower extremities.  5/5 strength of left upper extremity.  Limited strength and range of motion of right upper extremity secondary to pain.  Skin: Skin is warm and dry. Capillary refill takes less than 2 seconds.  Scattered areas of ecchymosis noted to bilateral lower extremities with chronic venous stasis changes.   Psychiatric: She has a normal mood and affect. Her speech is normal.  Nursing note and vitals reviewed.    ED Treatments / Results  Labs (all labs ordered are listed, but only abnormal results are displayed) Labs Reviewed  BASIC METABOLIC PANEL - Abnormal; Notable for the following components:      Result Value   Glucose, Bld 114 (*)    BUN 24 (*)    Calcium 10.4 (*)    GFR calc non Af Amer 50 (*)    GFR calc Af Amer 58 (*)    All other components within normal limits  PROTIME-INR - Abnormal; Notable for the following components:   Prothrombin Time 25.6 (*)    All other components within normal limits  CBC WITH  DIFFERENTIAL/PLATELET  CBC  BASIC METABOLIC PANEL  TSH  PROTIME-INR  I-STAT TROPONIN, ED  TYPE AND SCREEN  ABO/RH    EKG EKG Interpretation  Date/Time:  Thursday May 14 2018 00:03:35 EDT Ventricular Rate:  60 PR Interval:    QRS Duration: 109 QT Interval:  444 QTC Calculation: 444 R Axis:   -42 Text Interpretation:  Atrial fibrillation Ventricular bigeminy Left anterior fascicular block Anteroseptal infarct, old Repol abnrm suggests ischemia, lateral leads Confirmed by Orpah Greek [phone]) on 05/14/2018 2:57:58 AM   Radiology Dg Shoulder Right  Result Date: 05/13/2018 CLINICAL DATA:  Obvious deformity of the right shoulder after a fall. EXAM: RIGHT HUMERUS - 2+ VIEW; RIGHT SHOULDER - 2+ VIEW COMPARISON:  None. FINDINGS: Patient positioning limits examination. Two views of the right humerus and two views of the right shoulder are obtained. There is a comminuted fracture of the right humeral head and neck with impaction and varus angulation of the fracture fragments. Mild displacement of the greater tuberosity fragment. No glenohumeral or acromioclavicular dislocation. The distal humerus appears intact. Mild soft tissue swelling about the upper arm. No focal bone lesion or bone destruction. IMPRESSION: Comminuted fracture of the right humeral head and neck with impaction and varus angulation of the fracture fragments. Electronically Signed   By: Lucienne Capers M.D.   On: 05/13/2018 22:52   Ct Head Wo Contrast  Result Date: 05/13/2018 CLINICAL DATA:  Fall, left periorbital swelling. Anti coagulation. Dementia. EXAM: CT HEAD WITHOUT CONTRAST CT CERVICAL SPINE WITHOUT CONTRAST TECHNIQUE: Multidetector CT imaging of the head and cervical spine was performed following the standard protocol without intravenous contrast. Multiplanar CT image reconstructions of the cervical spine were also generated. COMPARISON:  05/29/2017 FINDINGS: CT HEAD FINDINGS Brain: Remote lacunar infarcts  in the upper cerebellum, left lentiform nucleus, and head of the left caudate nucleus. Overall this is similar to 10/20 12/2016. Small lacunar infarct in the head of the right caudate nucleus. Remote right frontal lobe infarct on image 24/3, not changed from prior. Periventricular white matter and corona radiata hypodensities favor chronic ischemic microvascular white matter disease. No intracranial hemorrhage, mass lesion, or acute CVA. Vascular: There is atherosclerotic calcification of the cavernous carotid arteries bilaterally. Skull: Unremarkable Sinuses/Orbits: Right periorbital hematoma without intraorbital abnormality.  The sinuses appear clear. Other: No supplemental non-categorized findings. CT CERVICAL SPINE FINDINGS Alignment: 2 mm degenerative retrolisthesis at C4-5. Skull base and vertebrae: Facet and mild interbody fusion at C2-3. Calcification in the transverse ligament of C1. Spurring and loss of articular space at the anterior C1-2 articulation. No cervical spine fracture or acute subluxation. Loss of disc height and associated spurring at all levels between C4 and T1. Soft tissues and spinal canal: Common carotid atherosclerotic calcifications. Disc levels: Osseous foraminal stenosis on the left at C5-6 due to uncinate and facet spurring. Upper chest: Unremarkable Other: No supplemental non-categorized findings. IMPRESSION: 1. No acute intracranial findings and no acute cervical spine findings. 2. Right periorbital soft tissue swelling/hematoma without intraorbital abnormality observed. 3. Cervical spondylosis with left foraminal impingement at C5-6. 4. Atherosclerosis. 5. Scattered small infarcts in the upper cerebellum and basal ganglia. Small right frontal chronic infarct. Periventricular white matter and corona radiata hypodensities favor chronic ischemic microvascular white matter disease. Electronically Signed   By: Van Clines M.D.   On: 05/13/2018 22:23   Ct Cervical Spine Wo  Contrast  Result Date: 05/13/2018 CLINICAL DATA:  Fall, left periorbital swelling. Anti coagulation. Dementia. EXAM: CT HEAD WITHOUT CONTRAST CT CERVICAL SPINE WITHOUT CONTRAST TECHNIQUE: Multidetector CT imaging of the head and cervical spine was performed following the standard protocol without intravenous contrast. Multiplanar CT image reconstructions of the cervical spine were also generated. COMPARISON:  05/29/2017 FINDINGS: CT HEAD FINDINGS Brain: Remote lacunar infarcts in the upper cerebellum, left lentiform nucleus, and head of the left caudate nucleus. Overall this is similar to 10/20 12/2016. Small lacunar infarct in the head of the right caudate nucleus. Remote right frontal lobe infarct on image 24/3, not changed from prior. Periventricular white matter and corona radiata hypodensities favor chronic ischemic microvascular white matter disease. No intracranial hemorrhage, mass lesion, or acute CVA. Vascular: There is atherosclerotic calcification of the cavernous carotid arteries bilaterally. Skull: Unremarkable Sinuses/Orbits: Right periorbital hematoma without intraorbital abnormality. The sinuses appear clear. Other: No supplemental non-categorized findings. CT CERVICAL SPINE FINDINGS Alignment: 2 mm degenerative retrolisthesis at C4-5. Skull base and vertebrae: Facet and mild interbody fusion at C2-3. Calcification in the transverse ligament of C1. Spurring and loss of articular space at the anterior C1-2 articulation. No cervical spine fracture or acute subluxation. Loss of disc height and associated spurring at all levels between C4 and T1. Soft tissues and spinal canal: Common carotid atherosclerotic calcifications. Disc levels: Osseous foraminal stenosis on the left at C5-6 due to uncinate and facet spurring. Upper chest: Unremarkable Other: No supplemental non-categorized findings. IMPRESSION: 1. No acute intracranial findings and no acute cervical spine findings. 2. Right periorbital soft  tissue swelling/hematoma without intraorbital abnormality observed. 3. Cervical spondylosis with left foraminal impingement at C5-6. 4. Atherosclerosis. 5. Scattered small infarcts in the upper cerebellum and basal ganglia. Small right frontal chronic infarct. Periventricular white matter and corona radiata hypodensities favor chronic ischemic microvascular white matter disease. Electronically Signed   By: Van Clines M.D.   On: 05/13/2018 22:23   Dg Humerus Right  Result Date: 05/13/2018 CLINICAL DATA:  Obvious deformity of the right shoulder after a fall. EXAM: RIGHT HUMERUS - 2+ VIEW; RIGHT SHOULDER - 2+ VIEW COMPARISON:  None. FINDINGS: Patient positioning limits examination. Two views of the right humerus and two views of the right shoulder are obtained. There is a comminuted fracture of the right humeral head and neck with impaction and varus angulation of the fracture fragments. Mild displacement of the  greater tuberosity fragment. No glenohumeral or acromioclavicular dislocation. The distal humerus appears intact. Mild soft tissue swelling about the upper arm. No focal bone lesion or bone destruction. IMPRESSION: Comminuted fracture of the right humeral head and neck with impaction and varus angulation of the fracture fragments. Electronically Signed   By: Lucienne Capers M.D.   On: 05/13/2018 22:52   Ct Maxillofacial Wo Contrast  Result Date: 05/13/2018 CLINICAL DATA:  Patient sustained a fall without loss of consciousness. EXAM: CT MAXILLOFACIAL WITHOUT CONTRAST TECHNIQUE: Multidetector CT imaging of the maxillofacial structures was performed. Multiplanar CT image reconstructions were also generated. COMPARISON:  05/13/2018 head CT FINDINGS: Osseous: Fracture of the nasal tip. No acute maxillofacial fracture otherwise noted. Mandibles are maintained with the condyles seated within the respective temporomandibular joints. Orbits: Intact Sinuses: Nonacute Soft tissues: Soft tissue swelling  about the right orbit. Limited intracranial: Atrophy with chronic small vessel ischemia. IMPRESSION: Nasal tip fracture.  Periorbital soft tissue swelling on right. Electronically Signed   By: Ashley Royalty M.D.   On: 05/13/2018 23:59    Procedures Procedures (including critical care time)  Medications Ordered in ED Medications  amLODipine (NORVASC) tablet 2.5 mg (has no administration in time range)  atenolol (TENORMIN) tablet 12.5 mg (has no administration in time range)  lisinopril (PRINIVIL,ZESTRIL) tablet 2.5 mg (has no administration in time range)  triamterene-hydrochlorothiazide (DYAZIDE) 37.5-25 MG per capsule 1 capsule (has no administration in time range)  levothyroxine (SYNTHROID, LEVOTHROID) tablet 100 mcg (has no administration in time range)  ondansetron (ZOFRAN) tablet 4 mg (has no administration in time range)    Or  ondansetron (ZOFRAN) injection 4 mg (has no administration in time range)  clopidogrel (PLAVIX) tablet 75 mg (has no administration in time range)  HYDROcodone-acetaminophen (NORCO/VICODIN) 5-325 MG per tablet 1 tablet (has no administration in time range)  sodium chloride 0.9 % bolus 250 mL (has no administration in time range)  Warfarin - Pharmacist Dosing Inpatient (has no administration in time range)  warfarin (COUMADIN) tablet 6 mg (has no administration in time range)  fentaNYL (SUBLIMAZE) injection 100 mcg (100 mcg Intravenous Given 05/13/18 2328)  ondansetron (ZOFRAN) injection 4 mg (4 mg Intravenous Given 05/14/18 0033)     Initial Impression / Assessment and Plan / ED Course  I have reviewed the triage vital signs and the nursing notes.  Pertinent labs & imaging results that were available during my care of the patient were reviewed by me and considered in my medical decision making (see chart for details).     ,84 y.o. F BIB EMS who presents for evaluation of fall. She is currently on Coumadin. History of dementia. Unclear etiology of fall but  EMS thinks mechanical. No CP, dizziness. On exam she has a hematoma noted to right forehead. Additionally she has tenderness to right shoulder with overlying soft tissue swelling concerning for a hemarthrosis. Plan for CT head, maxillofacial, XR shoulder/humerus.  Family at bedside state that pateint has been unsteady on her feet severael months and that this is not new   BMP shows BUN of 24, creatinine is 0.98.  PT/INR shows INR of 2.36.  CBC without any significant leukocytosis or anemia.  CT head shows no acute intracranial findings.  CT C-spine negative for any acute abnormality.  X-ray of humerus shows a comminuted fracture of the right humeral head and neck with impaction and varus angulation of the fracture fragments.  Distal humerus appears intact. CT Maxillofacial shows nasal tip fracture.   Discussed patient  with Dr. Doreatha Martin (Ortho) regarding fracture. Recommends non-op treatment and placing patient in a sling. If patient is admitted, he will plan to consult. S  Given the extent of the injury and hemarthrosis, as well as patient being on blood thinners, concern that patient may need admission for observation and further evaluation of her injuries.   Discussed patient with Dr. Tamala Julian (hospitalist). Accepts patient for admission.   Final Clinical Impressions(s) / ED Diagnoses   Final diagnoses:  Closed fracture of head of left humerus, initial encounter  Fall, initial encounter  Hemarthrosis    ED Discharge Orders    None       Volanda Napoleon, PA-C 05/14/18 0315    Orpah Greek, MD 05/15/18 [phone]

## 2018-05-14 ENCOUNTER — Other Ambulatory Visit: Payer: Self-pay

## 2018-05-14 DIAGNOSIS — S42424A Nondisplaced comminuted supracondylar fracture without intercondylar fracture of right humerus, initial encounter for closed fracture: Secondary | ICD-10-CM | POA: Diagnosis not present

## 2018-05-14 DIAGNOSIS — F0150 Vascular dementia without behavioral disturbance: Secondary | ICD-10-CM

## 2018-05-14 DIAGNOSIS — I10 Essential (primary) hypertension: Secondary | ICD-10-CM

## 2018-05-14 DIAGNOSIS — W19XXXA Unspecified fall, initial encounter: Secondary | ICD-10-CM | POA: Diagnosis not present

## 2018-05-14 DIAGNOSIS — S42201A Unspecified fracture of upper end of right humerus, initial encounter for closed fracture: Secondary | ICD-10-CM | POA: Diagnosis present

## 2018-05-14 DIAGNOSIS — R296 Repeated falls: Secondary | ICD-10-CM | POA: Diagnosis not present

## 2018-05-14 DIAGNOSIS — H05231 Hemorrhage of right orbit: Secondary | ICD-10-CM | POA: Diagnosis present

## 2018-05-14 DIAGNOSIS — E039 Hypothyroidism, unspecified: Secondary | ICD-10-CM

## 2018-05-14 DIAGNOSIS — M8000XS Age-related osteoporosis with current pathological fracture, unspecified site, sequela: Secondary | ICD-10-CM

## 2018-05-14 LAB — BASIC METABOLIC PANEL
ANION GAP: 7 (ref 5–15)
BUN: 21 mg/dL (ref 8–23)
CALCIUM: 9.7 mg/dL (ref 8.9–10.3)
CHLORIDE: 106 mmol/L (ref 98–111)
CO2: 27 mmol/L (ref 22–32)
Creatinine, Ser: 0.98 mg/dL (ref 0.44–1.00)
GFR calc non Af Amer: 50 mL/min — ABNORMAL LOW (ref >60)
GFR, EST AFRICAN AMERICAN: 58 mL/min — AB (ref >60)
GLUCOSE: 142 mg/dL — AB (ref 70–99)
POTASSIUM: 4.2 mmol/L (ref 3.5–5.1)
Sodium: 140 mmol/L (ref 135–145)

## 2018-05-14 LAB — CBC
HEMATOCRIT: 38.4 % (ref 36.0–46.0)
HEMOGLOBIN: 11.4 g/dL — AB (ref 12.0–15.0)
MCH: 26.6 pg (ref 26.0–34.0)
MCHC: 29.7 g/dL — ABNORMAL LOW (ref 30.0–36.0)
MCV: 89.7 fL (ref 80.0–100.0)
NRBC: 0 % (ref 0.0–0.2)
Platelets: 155 10*3/uL (ref 150–400)
RBC: 4.28 MIL/uL (ref 3.87–5.11)
RDW: 14 % (ref 11.5–15.5)
WBC: 8.9 10*3/uL (ref 4.0–10.5)

## 2018-05-14 LAB — TSH: TSH: 11.185 u[IU]/mL — ABNORMAL HIGH (ref 0.350–4.500)

## 2018-05-14 LAB — MRSA PCR SCREENING: MRSA BY PCR: NEGATIVE

## 2018-05-14 LAB — PROTIME-INR
INR: 2.47
PROTHROMBIN TIME: 26.5 s — AB (ref 11.4–15.2)

## 2018-05-14 MED ORDER — AMLODIPINE BESYLATE 2.5 MG PO TABS
2.5000 mg | ORAL_TABLET | Freq: Every day | ORAL | Status: DC
  Administered 2018-05-14 – 2018-05-15 (×2): 2.5 mg via ORAL
  Filled 2018-05-14 (×2): qty 1

## 2018-05-14 MED ORDER — WARFARIN - PHARMACIST DOSING INPATIENT: Freq: Every day | Status: DC

## 2018-05-14 MED ORDER — HYDROCODONE-ACETAMINOPHEN 5-325 MG PO TABS
1.0000 | ORAL_TABLET | ORAL | Status: DC | PRN
  Administered 2018-05-14: 1 via ORAL
  Filled 2018-05-14: qty 1

## 2018-05-14 MED ORDER — TRIAMTERENE-HCTZ 37.5-25 MG PO TABS
1.0000 | ORAL_TABLET | ORAL | Status: DC
  Administered 2018-05-15: 1 via ORAL
  Filled 2018-05-14: qty 1

## 2018-05-14 MED ORDER — SODIUM CHLORIDE 0.9 % IV BOLUS
250.0000 mL | Freq: Once | INTRAVENOUS | Status: AC
  Administered 2018-05-14: 250 mL via INTRAVENOUS

## 2018-05-14 MED ORDER — ONDANSETRON HCL 4 MG/2ML IJ SOLN: 4.0000 mg | Freq: Four times a day (QID) | INTRAMUSCULAR | Status: DC | PRN

## 2018-05-14 MED ORDER — LEVOTHYROXINE SODIUM 100 MCG PO TABS
100.0000 ug | ORAL_TABLET | Freq: Every day | ORAL | Status: DC
  Administered 2018-05-15: 100 ug via ORAL
  Filled 2018-05-14: qty 1

## 2018-05-14 MED ORDER — ONDANSETRON HCL 4 MG/2ML IJ SOLN
4.0000 mg | Freq: Once | INTRAMUSCULAR | Status: AC
  Administered 2018-05-14: 4 mg via INTRAVENOUS
  Filled 2018-05-14: qty 2

## 2018-05-14 MED ORDER — LISINOPRIL 2.5 MG PO TABS
2.5000 mg | ORAL_TABLET | Freq: Every day | ORAL | Status: DC
  Administered 2018-05-14 – 2018-05-15 (×2): 2.5 mg via ORAL
  Filled 2018-05-14 (×2): qty 1

## 2018-05-14 MED ORDER — WARFARIN SODIUM 6 MG PO TABS
6.0000 mg | ORAL_TABLET | Freq: Once | ORAL | Status: AC
  Administered 2018-05-14: 6 mg via ORAL
  Filled 2018-05-14: qty 1

## 2018-05-14 MED ORDER — ONDANSETRON HCL 4 MG PO TABS: 4.0000 mg | ORAL_TABLET | Freq: Four times a day (QID) | ORAL | Status: DC | PRN

## 2018-05-14 MED ORDER — CLOPIDOGREL BISULFATE 75 MG PO TABS
75.0000 mg | ORAL_TABLET | Freq: Every day | ORAL | Status: DC
  Administered 2018-05-14 – 2018-05-15 (×2): 75 mg via ORAL
  Filled 2018-05-14 (×2): qty 1

## 2018-05-14 MED ORDER — ATENOLOL 25 MG PO TABS
12.5000 mg | ORAL_TABLET | Freq: Every day | ORAL | Status: DC
  Administered 2018-05-14 – 2018-05-15 (×2): 12.5 mg via ORAL
  Filled 2018-05-14 (×2): qty 0.5

## 2018-05-14 NOTE — Consult Note (Signed)
Reason for Consult:Right humerus fx Referring Physician: C Delene [name] is an 84 y.o. female.  HPI: Courtney [name] has a history of frequent falls and fell again yesterday at home. It's possible she got tripped by the cat but the fall was unwitnessed. She is unable to provide details 2/2 her dementia. She was brought to the hospital where x-rays showed a right humeral head fracture and orthopedic surgery was consulted. She says the shoulder is feeling better today than yesterday.  Past Medical History:  Diagnosis Date  . Biatrial enlargement 12/01/2015  . Chronic anticoagulation    coumadin, CHADS2VASC=6  . History of mitral valve replacement   . Hypertension   . Hypothyroidism   . Osteoporosis    on Boniva  . Permanent atrial fibrillation   . Pulmonary hypertension, moderate to severe ([place]) 12/01/2015  . Stroke [facility])    on Clopidrigel  . UTI (lower urinary tract infection) 07/2015   n&v; AKI  . Vascular dementia [facility])     Past Surgical History:  Procedure Laterality Date  . ABDOMINAL HYSTERECTOMY    . FRACTURE SURGERY  [id number]   Operative repair with intramedullary nail intratrochanteric Left Hip fracture  . INTRAMEDULLARY (IM) NAIL INTERTROCHANTERIC Left 12/01/2015   Procedure: INTRAMEDULLARY (IM) NAIL INTERTROCHANTRIC;  Surgeon: Carole Civil, MD;  Location: [facility];  Service: Orthopedics;  Laterality: Left;  . MITRAL VALVE REPLACEMENT      Family History  Problem Relation Age of Onset  . Heart disease Mother   . Heart disease Father   . Stroke Neg Hx   . Cancer Neg Hx   . Diabetes Neg Hx     Social History:  reports that she has never smoked. She has never used smokeless tobacco. She reports that she does not drink alcohol or use drugs.  Allergies:  Allergies  Allergen Reactions  . Penicillins Swelling    Has patient had a PCN reaction causing immediate rash, facial/tongue/throat swelling, SOB or lightheadedness with hypotension: Yes Has patient had a PCN reaction  causing severe rash involving mucus membranes or skin necrosis: No Has patient had a PCN reaction that required hospitalization No Has patient had a PCN reaction occurring within the last 10 years: No If all of the above answers are "NO", then may proceed with Cephalosporin use. Whole body "swelled up" and had to go to the hospital    Medications: I have reviewed the patient's current medications.  Results for orders placed or performed during the hospital encounter of 05/13/18 (from the past 48 hour(s))  Basic metabolic panel     Status: Abnormal   Collection Time: 05/13/18  9:48 PM  Result Value Ref Range   Sodium 139 135 - 145 mmol/L   Potassium 3.6 3.5 - 5.1 mmol/L   Chloride 105 98 - 111 mmol/L   CO2 26 22 - 32 mmol/L   Glucose, Bld 114 (H) 70 - 99 mg/dL   BUN 24 (H) 8 - 23 mg/dL   Creatinine, Ser 0.98 0.44 - 1.00 mg/dL   Calcium 10.4 (H) 8.9 - 10.3 mg/dL   GFR calc non Af Amer 50 (L) >60 mL/min   GFR calc Af Amer 58 (L) >60 mL/min    Comment: (NOTE) The eGFR has been calculated using the CKD EPI equation. This calculation has not been validated in all clinical situations. eGFR's persistently <60 mL/min signify possible Chronic Kidney Disease.    Anion gap 8 5 - 15    Comment: Performed at [facility], [place] [address], [place], [place] [address]  CBC with Differential     Status: None   Collection Time: 05/13/18  9:48 PM  Result Value Ref Range   WBC 8.2 4.0 - 10.5 K/uL   RBC 4.66 3.87 - 5.11 MIL/uL   Hemoglobin 12.6 12.0 - 15.0 g/dL   HCT 41.4 36.0 - 46.0 %   MCV 88.8 80.0 - 100.0 fL   MCH 27.0 26.0 - 34.0 pg   MCHC 30.4 30.0 - 36.0 g/dL   RDW 14.1 11.5 - 15.5 %   Platelets 184 150 - 400 K/uL   nRBC 0.0 0.0 - 0.2 %   Neutrophils Relative % 83 %   Neutro Abs 6.9 1.7 - 7.7 K/uL   Lymphocytes Relative 8 %   Lymphs Abs 0.7 0.7 - 4.0 K/uL   Monocytes Relative 7 %   Monocytes Absolute 0.6 0.1 - 1.0 K/uL   Eosinophils Relative 1 %   Eosinophils Absolute 0.1 0.0  - 0.5 K/uL   Basophils Relative 0 %   Basophils Absolute 0.0 0.0 - 0.1 K/uL   Immature Granulocytes 1 %   Abs Immature Granulocytes 0.06 0.00 - 0.07 K/uL    Comment: Performed at [facility] [address], [place], [place] [address]  Protime-INR     Status: Abnormal   Collection Time: 05/13/18  9:48 PM  Result Value Ref Range   Prothrombin Time 25.6 (H) 11.4 - 15.2 seconds   INR 2.36     Comment: Performed at [facility] [address] [place], [place] [address]  Type and screen [place]     Status: None   Collection Time: 05/13/18  9:48 PM  Result Value Ref Range   ABO/RH(D) A POS    Antibody Screen NEG    Sample Expiration      05/16/2018 Performed at [facility], [place] [address], [place], [place] [address]   ABO/Rh     Status: None   Collection Time: 05/13/18  9:48 PM  Result Value Ref Range   ABO/RH(D)      A POS Performed at [facility] [address], [place], [place] [address]   TSH     Status: Abnormal   Collection Time: 05/13/18  9:48 PM  Result Value Ref Range   TSH 11.185 (H) 0.350 - 4.500 uIU/mL    Comment: Performed by a 3rd Generation assay with a functional sensitivity of <=0.01 uIU/mL. Performed at [facility], [place] [address] [place], [place] [address]   I-stat troponin, ED     Status: None   Collection Time: 05/13/18 11:39 PM  Result Value Ref Range   Troponin i, poc 0.01 0.00 - 0.08 ng/mL   Comment 3            Comment: Due to the release kinetics of cTnI, a negative result within the first hours of the onset of symptoms does not rule out myocardial infarction with certainty. If myocardial infarction is still suspected, repeat the test at appropriate intervals.   CBC     Status: Abnormal   Collection Time: 05/14/18  3:20 AM  Result Value Ref Range   WBC 8.9 4.0 - 10.5 K/uL   RBC 4.28 3.87 - 5.11 MIL/uL   Hemoglobin 11.4 (L) 12.0 - 15.0 g/dL   HCT 38.4 36.0 - 46.0 %   MCV 89.7 80.0 -  100.0 fL   MCH 26.6 26.0 -  34.0 pg   MCHC 29.7 (L) 30.0 - 36.0 g/dL   RDW 14.0 11.5 - 15.5 %   Platelets 155 150 - 400 K/uL   nRBC 0.0 0.0 - 0.2 %    Comment: Performed at [facility], [place] [address], [place], [place] [address]  Basic metabolic panel     Status: Abnormal   Collection Time: 05/14/18  3:20 AM  Result Value Ref Range   Sodium 140 135 - 145 mmol/L   Potassium 4.2 3.5 - 5.1 mmol/L   Chloride 106 98 - 111 mmol/L   CO2 27 22 - 32 mmol/L   Glucose, Bld 142 (H) 70 - 99 mg/dL   BUN 21 8 - 23 mg/dL   Creatinine, Ser 0.98 0.44 - 1.00 mg/dL   Calcium 9.7 8.9 - 10.3 mg/dL   GFR calc non Af Amer 50 (L) >60 mL/min   GFR calc Af Amer 58 (L) >60 mL/min    Comment: (NOTE) The eGFR has been calculated using the CKD EPI equation. This calculation has not been validated in all clinical situations. eGFR's persistently <60 mL/min signify possible Chronic Kidney Disease.    Anion gap 7 5 - 15    Comment: Performed at [facility] [address] [place], [place] [address]  Protime-INR     Status: Abnormal   Collection Time: 05/14/18  3:21 AM  Result Value Ref Range   Prothrombin Time 26.5 (H) 11.4 - 15.2 seconds   INR 2.47     Comment: Performed at [facility] [address] [place], [place] [address]  MRSA PCR Screening     Status: None   Collection Time: 05/14/18  6:13 AM  Result Value Ref Range   MRSA by PCR NEGATIVE NEGATIVE    Comment:        The GeneXpert MRSA Assay (FDA approved for NASAL specimens only), is one component of a comprehensive MRSA colonization surveillance program. It is not intended to diagnose MRSA infection nor to guide or monitor treatment for MRSA infections. Performed at [facility], [place] [address] [place],  [address]     Dg Shoulder Right  Result Date: 05/13/2018 CLINICAL DATA:  Obvious deformity of the right shoulder after a fall. EXAM: RIGHT HUMERUS - 2+ VIEW; RIGHT SHOULDER - 2+ VIEW COMPARISON:  None.  FINDINGS: Patient positioning limits examination. Two views of the right humerus and two views of the right shoulder are obtained. There is a comminuted fracture of the right humeral head and neck with impaction and varus angulation of the fracture fragments. Mild displacement of the greater tuberosity fragment. No glenohumeral or acromioclavicular dislocation. The distal humerus appears intact. Mild soft tissue swelling about the upper arm. No focal bone lesion or bone destruction. IMPRESSION: Comminuted fracture of the right humeral head and neck with impaction and varus angulation of the fracture fragments. Electronically Signed   By: Lucienne Capers M.D.   On: 05/13/2018 22:52   Ct Head Wo Contrast  Result Date: 05/13/2018 CLINICAL DATA:  Fall, left periorbital swelling. Anti coagulation. Dementia. EXAM: CT HEAD WITHOUT CONTRAST CT CERVICAL SPINE WITHOUT CONTRAST TECHNIQUE: Multidetector CT imaging of the head and cervical spine was performed following the standard protocol without intravenous contrast. Multiplanar CT image reconstructions of the cervical spine were also generated. COMPARISON:  05/29/2017 FINDINGS: CT HEAD FINDINGS Brain: Remote lacunar infarcts in the upper cerebellum, left lentiform nucleus, and head of the left caudate nucleus. Overall this is similar  to 10/20 12/2016. Small lacunar infarct in the head of the right caudate nucleus. Remote right frontal lobe infarct on image 24/3, not changed from prior. Periventricular white matter and corona radiata hypodensities favor chronic ischemic microvascular white matter disease. No intracranial hemorrhage, mass lesion, or acute CVA. Vascular: There is atherosclerotic calcification of the cavernous carotid arteries bilaterally. Skull: Unremarkable Sinuses/Orbits: Right periorbital hematoma without intraorbital abnormality. The sinuses appear clear. Other: No supplemental non-categorized findings. CT CERVICAL SPINE FINDINGS Alignment: 2 mm  degenerative retrolisthesis at C4-5. Skull base and vertebrae: Facet and mild interbody fusion at C2-3. Calcification in the transverse ligament of C1. Spurring and loss of articular space at the anterior C1-2 articulation. No cervical spine fracture or acute subluxation. Loss of disc height and associated spurring at all levels between C4 and T1. Soft tissues and spinal canal: Common carotid atherosclerotic calcifications. Disc levels: Osseous foraminal stenosis on the left at C5-6 due to uncinate and facet spurring. Upper chest: Unremarkable Other: No supplemental non-categorized findings. IMPRESSION: 1. No acute intracranial findings and no acute cervical spine findings. 2. Right periorbital soft tissue swelling/hematoma without intraorbital abnormality observed. 3. Cervical spondylosis with left foraminal impingement at C5-6. 4. Atherosclerosis. 5. Scattered small infarcts in the upper cerebellum and basal ganglia. Small right frontal chronic infarct. Periventricular white matter and corona radiata hypodensities favor chronic ischemic microvascular white matter disease. Electronically Signed   By: Van Clines M.D.   On: 05/13/2018 22:23   Ct Cervical Spine Wo Contrast  Result Date: 05/13/2018 CLINICAL DATA:  Fall, left periorbital swelling. Anti coagulation. Dementia. EXAM: CT HEAD WITHOUT CONTRAST CT CERVICAL SPINE WITHOUT CONTRAST TECHNIQUE: Multidetector CT imaging of the head and cervical spine was performed following the standard protocol without intravenous contrast. Multiplanar CT image reconstructions of the cervical spine were also generated. COMPARISON:  05/29/2017 FINDINGS: CT HEAD FINDINGS Brain: Remote lacunar infarcts in the upper cerebellum, left lentiform nucleus, and head of the left caudate nucleus. Overall this is similar to 10/20 12/2016. Small lacunar infarct in the head of the right caudate nucleus. Remote right frontal lobe infarct on image 24/3, not changed from prior.  Periventricular white matter and corona radiata hypodensities favor chronic ischemic microvascular white matter disease. No intracranial hemorrhage, mass lesion, or acute CVA. Vascular: There is atherosclerotic calcification of the cavernous carotid arteries bilaterally. Skull: Unremarkable Sinuses/Orbits: Right periorbital hematoma without intraorbital abnormality. The sinuses appear clear. Other: No supplemental non-categorized findings. CT CERVICAL SPINE FINDINGS Alignment: 2 mm degenerative retrolisthesis at C4-5. Skull base and vertebrae: Facet and mild interbody fusion at C2-3. Calcification in the transverse ligament of C1. Spurring and loss of articular space at the anterior C1-2 articulation. No cervical spine fracture or acute subluxation. Loss of disc height and associated spurring at all levels between C4 and T1. Soft tissues and spinal canal: Common carotid atherosclerotic calcifications. Disc levels: Osseous foraminal stenosis on the left at C5-6 due to uncinate and facet spurring. Upper chest: Unremarkable Other: No supplemental non-categorized findings. IMPRESSION: 1. No acute intracranial findings and no acute cervical spine findings. 2. Right periorbital soft tissue swelling/hematoma without intraorbital abnormality observed. 3. Cervical spondylosis with left foraminal impingement at C5-6. 4. Atherosclerosis. 5. Scattered small infarcts in the upper cerebellum and basal ganglia. Small right frontal chronic infarct. Periventricular white matter and corona radiata hypodensities favor chronic ischemic microvascular white matter disease. Electronically Signed   By: Van Clines M.D.   On: 05/13/2018 22:23   Dg Humerus Right  Result Date: 05/13/2018 CLINICAL DATA:  Obvious deformity of the right shoulder after a fall. EXAM: RIGHT HUMERUS - 2+ VIEW; RIGHT SHOULDER - 2+ VIEW COMPARISON:  None. FINDINGS: Patient positioning limits examination. Two views of the right humerus and two views of the  right shoulder are obtained. There is a comminuted fracture of the right humeral head and neck with impaction and varus angulation of the fracture fragments. Mild displacement of the greater tuberosity fragment. No glenohumeral or acromioclavicular dislocation. The distal humerus appears intact. Mild soft tissue swelling about the upper arm. No focal bone lesion or bone destruction. IMPRESSION: Comminuted fracture of the right humeral head and neck with impaction and varus angulation of the fracture fragments. Electronically Signed   By: Lucienne Capers M.D.   On: 05/13/2018 22:52   Ct Maxillofacial Wo Contrast  Result Date: 05/13/2018 CLINICAL DATA:  Patient sustained a fall without loss of consciousness. EXAM: CT MAXILLOFACIAL WITHOUT CONTRAST TECHNIQUE: Multidetector CT imaging of the maxillofacial structures was performed. Multiplanar CT image reconstructions were also generated. COMPARISON:  05/13/2018 head CT FINDINGS: Osseous: Fracture of the nasal tip. No acute maxillofacial fracture otherwise noted. Mandibles are maintained with the condyles seated within the respective temporomandibular joints. Orbits: Intact Sinuses: Nonacute Soft tissues: Soft tissue swelling about the right orbit. Limited intracranial: Atrophy with chronic small vessel ischemia. IMPRESSION: Nasal tip fracture.  Periorbital soft tissue swelling on right. Electronically Signed   By: Ashley Royalty M.D.   On: 05/13/2018 23:59    Review of Systems  Unable to perform ROS: Dementia  Musculoskeletal: Positive for joint pain (Right shoulder).   Blood pressure 126/87, pulse 68, temperature 98.1 F (36.7 Courtney), temperature source Oral, resp. rate 18, SpO2 98 %. Physical Exam  Constitutional: She appears well-developed and well-nourished. No distress.  HENT:  Head: Normocephalic and atraumatic.  Eyes: Conjunctivae are normal. Right eye exhibits no discharge. Left eye exhibits no discharge. No scleral icterus.  Neck: Normal range of  motion.  Cardiovascular: Normal rate and regular rhythm.  Respiratory: Effort normal. No respiratory distress.  Musculoskeletal:  Right shoulder, elbow, wrist, digits- no skin wounds, mild TTP, no instability, no blocks to motion  Sens  Ax/R/M/U intact  Mot   Ax/ R/ PIN/ M/ AIN/ U intact  Rad 2+  Neurological: She is alert.  Skin: Skin is warm and dry. She is not diaphoretic.  Psychiatric: She has a normal mood and affect. Her behavior is normal. Cognition and memory are impaired.    Assessment/Plan: Fall (frequent falls) Right humeral head fx -- Plan non-operative management in sling with NWB and early motion. F/u with Dr. Doreatha Martin in office in 2 weeks. Multiple medical problems including chronic atrial fibrillation on Coumadin, HTN, CVA, MVR, osteoporosis, and dementia -- per primary team    Lisette Abu, PA-[facility] [phone] 05/14/2018, 8:57 AM

## 2018-05-14 NOTE — Progress Notes (Signed)
This is a charge note.  For further documentation, please see the admission H&P above and her partner Dr. Tamala Julian from earlier today.  In brief this is a 84 year old female who sustained a mechanical fall, broke her right humerus.  For PT evaluation and discharge with HHPT tomorrow.  She has medically stable Afib on warfarin, HTN, MVR, CVA, hypothyroidism.  Repeat Ca tomrorow.

## 2018-05-14 NOTE — ED Notes (Signed)
  Sling placed on R arm by [facility] NT

## 2018-05-14 NOTE — H&P (Addendum)
History and Physical    Courtney [name] EUM:[id number] DOB: 11-Mar-1929 DOA: 05/13/2018  Referring MD/NP/PA: Providence Lanius, PA-C PCP: Patient, No Pcp Per  Patient coming from: Home via EMS  Chief Complaint: Fall I have personally briefly reviewed patient's old medical records in [place]   HPI: Courtney [name] is a 84 y.o. female with medical history significant of chronic atrial fibrillation on Coumadin, HTN, CVA, MVR, osteoporosis, dementia, and frequent falls; who presents after having an unwitnessed fall at home.  History is obtained from the patient's niece who is present at bedside as patient has dementia and currently does not recall falling.  At baseline patient lives at home with her son and ambulates with the use of a walker although she does not always remember to use this.  Niece reports that the home where she lives is has been multiple areas with the patient risk for falls.  Family thinks that she had opened the back door and had possibly gotten tangled up by the cat coming in the house.  Over the last 2 years she has had multiple falls and has previously fractured her left hip requiring surgical correction and left humerus.  The patient denies having any complaints at this time.  ED Course: On admission into the emergency department patient was noted to have stable vital signs.  Labs revealed normal CBC, BUN 24, creatinine 0.98, calcium 10.4, and INR 2.36.  Patient received 100 mcg of fentanyl and 4 mg of Zofran.  CT scan of the head and cervical spine showed no acute intracranial or fracture.  Maxillofacial CT scan was obtained which did reveal signs of a nasal tip fracture.  X-rays of the right shoulder revealed a communicated fracture with impaction.  Dr. Doreatha Martin of orthopedics was consulted, but recommended placing patient in a sling and they would see if admitted into the hospital.  [facility] was called to admit for observation overnight.   Review of Systems  Unable to perform ROS: Dementia   Musculoskeletal: Positive for falls.  Psychiatric/Behavioral: Positive for memory loss.     Past Medical History:  Diagnosis Date  . Biatrial enlargement 12/01/2015  . Chronic anticoagulation    coumadin, CHADS2VASC=6  . History of mitral valve replacement   . Hypertension   . Hypothyroidism   . Osteoporosis    on Boniva  . Permanent atrial fibrillation   . Pulmonary hypertension, moderate to severe ([place]) 12/01/2015  . Stroke [facility])    on Clopidrigel  . UTI (lower urinary tract infection) 07/2015   n&v; AKI  . Vascular dementia [facility])     Past Surgical History:  Procedure Laterality Date  . ABDOMINAL HYSTERECTOMY    . FRACTURE SURGERY  [id number]   Operative repair with intramedullary nail intratrochanteric Left Hip fracture  . INTRAMEDULLARY (IM) NAIL INTERTROCHANTERIC Left 12/01/2015   Procedure: INTRAMEDULLARY (IM) NAIL INTERTROCHANTRIC;  Surgeon: Carole Civil, MD;  Location: [facility];  Service: Orthopedics;  Laterality: Left;  . MITRAL VALVE REPLACEMENT       reports that she has never smoked. She has never used smokeless tobacco. She reports that she does not drink alcohol or use drugs.  Allergies  Allergen Reactions  . Penicillins Swelling    Has patient had a PCN reaction causing immediate rash, facial/tongue/throat swelling, SOB or lightheadedness with hypotension: Yes Has patient had a PCN reaction causing severe rash involving mucus membranes or skin necrosis: No Has patient had a PCN reaction that required hospitalization No Has patient had a PCN  reaction occurring within the last 10 years: No If all of the above answers are "NO", then may proceed with Cephalosporin use. Whole body "swelled up" and had to go to the hospital    Family History  Problem Relation Age of Onset  . Heart disease Mother   . Heart disease Father   . Stroke Neg Hx   . Cancer Neg Hx   . Diabetes Neg Hx     Prior to Admission medications   Medication Sig Start Date End Date  Taking? Authorizing Provider  acetaminophen (TYLENOL) 500 MG tablet Take 500 mg by mouth every 6 (six) hours as needed for mild pain.    [provider]  amLODipine (NORVASC) 2.5 MG tablet Take 1 tablet (2.5 mg total) by mouth daily. 02/10/18   Caren Macadam, MD  atenolol (TENORMIN) 25 MG tablet Take 0.5 tablets (12.5 mg total) by mouth daily. 02/10/18   Caren Macadam, MD  b complex vitamins tablet Take 1 tablet by mouth daily.    [provider]  clopidogrel (PLAVIX) 75 MG tablet Take 1 tablet (75 mg total) by mouth daily. 02/10/18   Caren Macadam, MD  ibandronate (BONIVA) 150 MG tablet Take 1 tablet (150 mg total) by mouth every 30 (thirty) days. Take in the morning with a full glass of water, on an empty stomach, and do not take anything else by mouth or lie down for the next 30 min. 02/10/18   Caren Macadam, MD  levothyroxine (SYNTHROID, LEVOTHROID) 100 MCG tablet Take 1 tablet (100 mcg total) by mouth daily. 02/10/18   Caren Macadam, MD  lisinopril (PRINIVIL,ZESTRIL) 2.5 MG tablet Take 1 tablet (2.5 mg total) by mouth daily. 02/10/18   Caren Macadam, MD  triamterene-hydrochlorothiazide (DYAZIDE) 37.5-25 MG capsule Take 1 each (1 capsule total) by mouth every other day. 02/10/18   Caren Macadam, MD  vitamin C (ASCORBIC ACID) 250 MG tablet Take 250 mg by mouth daily.     [provider]  VITAMIN D, ERGOCALCIFEROL, PO Take 250 mg by mouth daily.    [provider]  warfarin (COUMADIN) 3 MG tablet Take one po qd 02/10/18   Caren Macadam, MD  warfarin (COUMADIN) 3 MG tablet TAKE ONE PILL ON MONDAY NIGHT, TUESDAY NIGHT, WEDNESDAY NIGHT, THURSDAY NIGHT, AND FRIDAY NIGHT. 03/31/18   Caren Macadam, MD  warfarin (COUMADIN) 4 MG tablet TAKE ONE PILL ON SATURDAY NIGHT AND ONE PILL ON SUNDAY NIGHT EACH WEEK. 03/26/18   Caren Macadam, MD    Physical Exam:  Constitutional: Elderly female in NAD, calm, comfortable Vitals:   05/14/18 0030 05/14/18 0130 05/14/18 0230 05/14/18 0315   BP: 127/68 138/68 130/67 136/65  Pulse: 77 61 60 68  Resp:      Temp:      TempSrc:      SpO2: 93% 97% 94% 98%   Eyes: Bruising and swelling noted on the right eyelid.  Left eye pupil round and reactive to light. ENMT: Mucous membranes are moist. Posterior pharynx clear of any exudate or lesions.  Neck: normal, supple, no masses, no thyromegaly Respiratory: clear to auscultation bilaterally, no wheezing, no crackles. Normal respiratory effort. No accessory muscle use.  Cardiovascular: Irregularly irregular, no murmurs / rubs / gallops. No extremity edema. 2+ pedal pulses. No carotid bruits.  Abdomen: no tenderness, no masses palpated. No hepatosplenomegaly. Bowel sounds positive.  Musculoskeletal: no clubbing / cyanosis.  Deformity of the right arm and shoulder. Skin: Patient with bruising of the right shoulder and face as  described above. Neurologic: CN 2-12 grossly intact. Sensation intact, DTR normal. Strength 5/5 in all 4.  Psychiatric: Poor memory. Alert and oriented x 1. Normal mood.     Labs on Admission: I have personally reviewed following labs and imaging studies  CBC: Recent Labs  Lab 05/13/18 2148 05/14/18 0320  WBC 8.2 8.9  NEUTROABS 6.9  --   HGB 12.6 11.4*  HCT 41.4 38.4  MCV 88.8 89.7  PLT 184 528   Basic Metabolic Panel: Recent Labs  Lab 05/13/18 2148  NA 139  K 3.6  CL 105  CO2 26  GLUCOSE 114*  BUN 24*  CREATININE 0.98  CALCIUM 10.4*   GFR: CrCl cannot be calculated (Unknown ideal weight.). Liver Function Tests: No results for input(s): AST, ALT, ALKPHOS, BILITOT, PROT, ALBUMIN in the last 168 hours. No results for input(s): LIPASE, AMYLASE in the last 168 hours. No results for input(s): AMMONIA in the last 168 hours. Coagulation Profile: Recent Labs  Lab 05/12/18 1333 05/13/18 2148  INR 2.6 2.36   Cardiac Enzymes: No results for input(s): CKTOTAL, CKMB, CKMBINDEX, TROPONINI in the last 168 hours. BNP (last 3 results) No results for  input(s): PROBNP in the last 8760 hours. HbA1C: No results for input(s): HGBA1C in the last 72 hours. CBG: No results for input(s): GLUCAP in the last 168 hours. Lipid Profile: No results for input(s): CHOL, HDL, LDLCALC, TRIG, CHOLHDL, LDLDIRECT in the last 72 hours. Thyroid Function Tests: No results for input(s): TSH, T4TOTAL, FREET4, T3FREE, THYROIDAB in the last 72 hours. Anemia Panel: No results for input(s): VITAMINB12, FOLATE, FERRITIN, TIBC, IRON, RETICCTPCT in the last 72 hours. Urine analysis:    Component Value Date/Time   COLORURINE YELLOW 07/12/2015 1345   APPEARANCEUR HAZY (A) 07/12/2015 1345   LABSPEC 1.020 07/12/2015 1345   PHURINE 6.0 07/12/2015 1345   GLUCOSEU NEGATIVE 07/12/2015 1345   HGBUR LARGE (A) 07/12/2015 1345   BILIRUBINUR NEGATIVE 07/12/2015 1345   KETONESUR TRACE (A) 07/12/2015 1345   PROTEINUR 100 (A) 07/12/2015 1345   NITRITE POSITIVE (A) 07/12/2015 1345   LEUKOCYTESUR MODERATE (A) 07/12/2015 1345   Sepsis Labs: No results found for this or any previous visit (from the past 240 hour(s)).   Radiological Exams on Admission: Dg Shoulder Right  Result Date: 05/13/2018 CLINICAL DATA:  Obvious deformity of the right shoulder after a fall. EXAM: RIGHT HUMERUS - 2+ VIEW; RIGHT SHOULDER - 2+ VIEW COMPARISON:  None. FINDINGS: Patient positioning limits examination. Two views of the right humerus and two views of the right shoulder are obtained. There is a comminuted fracture of the right humeral head and neck with impaction and varus angulation of the fracture fragments. Mild displacement of the greater tuberosity fragment. No glenohumeral or acromioclavicular dislocation. The distal humerus appears intact. Mild soft tissue swelling about the upper arm. No focal bone lesion or bone destruction. IMPRESSION: Comminuted fracture of the right humeral head and neck with impaction and varus angulation of the fracture fragments. Electronically Signed   By: Lucienne Capers M.D.   On: 05/13/2018 22:52   Ct Head Wo Contrast  Result Date: 05/13/2018 CLINICAL DATA:  Fall, left periorbital swelling. Anti coagulation. Dementia. EXAM: CT HEAD WITHOUT CONTRAST CT CERVICAL SPINE WITHOUT CONTRAST TECHNIQUE: Multidetector CT imaging of the head and cervical spine was performed following the standard protocol without intravenous contrast. Multiplanar CT image reconstructions of the cervical spine were also generated. COMPARISON:  05/29/2017 FINDINGS: CT HEAD FINDINGS Brain: Remote lacunar infarcts in the upper  cerebellum, left lentiform nucleus, and head of the left caudate nucleus. Overall this is similar to 10/20 12/2016. Small lacunar infarct in the head of the right caudate nucleus. Remote right frontal lobe infarct on image 24/3, not changed from prior. Periventricular white matter and corona radiata hypodensities favor chronic ischemic microvascular white matter disease. No intracranial hemorrhage, mass lesion, or acute CVA. Vascular: There is atherosclerotic calcification of the cavernous carotid arteries bilaterally. Skull: Unremarkable Sinuses/Orbits: Right periorbital hematoma without intraorbital abnormality. The sinuses appear clear. Other: No supplemental non-categorized findings. CT CERVICAL SPINE FINDINGS Alignment: 2 mm degenerative retrolisthesis at C4-5. Skull base and vertebrae: Facet and mild interbody fusion at C2-3. Calcification in the transverse ligament of C1. Spurring and loss of articular space at the anterior C1-2 articulation. No cervical spine fracture or acute subluxation. Loss of disc height and associated spurring at all levels between C4 and T1. Soft tissues and spinal canal: Common carotid atherosclerotic calcifications. Disc levels: Osseous foraminal stenosis on the left at C5-6 due to uncinate and facet spurring. Upper chest: Unremarkable Other: No supplemental non-categorized findings. IMPRESSION: 1. No acute intracranial findings and no acute  cervical spine findings. 2. Right periorbital soft tissue swelling/hematoma without intraorbital abnormality observed. 3. Cervical spondylosis with left foraminal impingement at C5-6. 4. Atherosclerosis. 5. Scattered small infarcts in the upper cerebellum and basal ganglia. Small right frontal chronic infarct. Periventricular white matter and corona radiata hypodensities favor chronic ischemic microvascular white matter disease. Electronically Signed   By: Van Clines M.D.   On: 05/13/2018 22:23   Ct Cervical Spine Wo Contrast  Result Date: 05/13/2018 CLINICAL DATA:  Fall, left periorbital swelling. Anti coagulation. Dementia. EXAM: CT HEAD WITHOUT CONTRAST CT CERVICAL SPINE WITHOUT CONTRAST TECHNIQUE: Multidetector CT imaging of the head and cervical spine was performed following the standard protocol without intravenous contrast. Multiplanar CT image reconstructions of the cervical spine were also generated. COMPARISON:  05/29/2017 FINDINGS: CT HEAD FINDINGS Brain: Remote lacunar infarcts in the upper cerebellum, left lentiform nucleus, and head of the left caudate nucleus. Overall this is similar to 10/20 12/2016. Small lacunar infarct in the head of the right caudate nucleus. Remote right frontal lobe infarct on image 24/3, not changed from prior. Periventricular white matter and corona radiata hypodensities favor chronic ischemic microvascular white matter disease. No intracranial hemorrhage, mass lesion, or acute CVA. Vascular: There is atherosclerotic calcification of the cavernous carotid arteries bilaterally. Skull: Unremarkable Sinuses/Orbits: Right periorbital hematoma without intraorbital abnormality. The sinuses appear clear. Other: No supplemental non-categorized findings. CT CERVICAL SPINE FINDINGS Alignment: 2 mm degenerative retrolisthesis at C4-5. Skull base and vertebrae: Facet and mild interbody fusion at C2-3. Calcification in the transverse ligament of C1. Spurring and loss of  articular space at the anterior C1-2 articulation. No cervical spine fracture or acute subluxation. Loss of disc height and associated spurring at all levels between C4 and T1. Soft tissues and spinal canal: Common carotid atherosclerotic calcifications. Disc levels: Osseous foraminal stenosis on the left at C5-6 due to uncinate and facet spurring. Upper chest: Unremarkable Other: No supplemental non-categorized findings. IMPRESSION: 1. No acute intracranial findings and no acute cervical spine findings. 2. Right periorbital soft tissue swelling/hematoma without intraorbital abnormality observed. 3. Cervical spondylosis with left foraminal impingement at C5-6. 4. Atherosclerosis. 5. Scattered small infarcts in the upper cerebellum and basal ganglia. Small right frontal chronic infarct. Periventricular white matter and corona radiata hypodensities favor chronic ischemic microvascular white matter disease. Electronically Signed   By: Cindra Eves.D.  On: 05/13/2018 22:23   Dg Humerus Right  Result Date: 05/13/2018 CLINICAL DATA:  Obvious deformity of the right shoulder after a fall. EXAM: RIGHT HUMERUS - 2+ VIEW; RIGHT SHOULDER - 2+ VIEW COMPARISON:  None. FINDINGS: Patient positioning limits examination. Two views of the right humerus and two views of the right shoulder are obtained. There is a comminuted fracture of the right humeral head and neck with impaction and varus angulation of the fracture fragments. Mild displacement of the greater tuberosity fragment. No glenohumeral or acromioclavicular dislocation. The distal humerus appears intact. Mild soft tissue swelling about the upper arm. No focal bone lesion or bone destruction. IMPRESSION: Comminuted fracture of the right humeral head and neck with impaction and varus angulation of the fracture fragments. Electronically Signed   By: Lucienne Capers M.D.   On: 05/13/2018 22:52   Ct Maxillofacial Wo Contrast  Result Date: 05/13/2018 CLINICAL  DATA:  Patient sustained a fall without loss of consciousness. EXAM: CT MAXILLOFACIAL WITHOUT CONTRAST TECHNIQUE: Multidetector CT imaging of the maxillofacial structures was performed. Multiplanar CT image reconstructions were also generated. COMPARISON:  05/13/2018 head CT FINDINGS: Osseous: Fracture of the nasal tip. No acute maxillofacial fracture otherwise noted. Mandibles are maintained with the condyles seated within the respective temporomandibular joints. Orbits: Intact Sinuses: Nonacute Soft tissues: Soft tissue swelling about the right orbit. Limited intracranial: Atrophy with chronic small vessel ischemia. IMPRESSION: Nasal tip fracture.  Periorbital soft tissue swelling on right. Electronically Signed   By: Ashley Royalty M.D.   On: 05/13/2018 23:59    EKG: Independently reviewed.  Atrial fibrillation at 60 bpm  Assessment/Plan Frequent falls, right frontal/periorbital hematoma, nasal tip fracture: Acute.  Patient presents after having a unwitnessed fall at home.  Family suggests that it was possibly mechanical in nature.  Imaging showed right periorbital hematoma without signs of acute intercranial abnormality/bleeding.  Patient's son is primary caregiver, but may need further assistance and/or placement for the patient due to overall care needs. - Admit to a telemetry bed - Check orthostatic vital signs - Physical therapy to eval and treat - Social work consult for possible need of placement.   Communicated right humerus fracture: Acute.  As seen prior x-rays.  Secondary to fall.  Patient was placed in a sling.  Dr. Doreatha Martin of orthopedics was consulted, and will see the patient if it admitted.  - Hydrocodone as needed pain - Continue sling - Follow-up with orthopedics in a.m.  Permanent atrial fibrillation on chronic anticoagulation: Heart rates appear to be rate controlled with INR of 2.36. CHA2DS2-VASc score = 6.  Patient also noted to have significant risks of falls.  Patient followed  by Dr. Johnsie Cancel cardiology currently. - Continue atenolol and Coumadin per pharmacy - Daily PT/INR  Mitral valve replacement: Patient with previous mitral valve replaced in [facility] at [facility] by Dr. Leeroy Bock.   Essential hypertension - Continue amlodipine, atenolol, lisinopril, and Dyazide once medication list verified by pharmacy  Vascular dementia: Stable.  Patient oriented to self.  History of CVA - Continue Plavix  Hypercalcemia: Acute.  Initial calcium mildly elevated at 10.4. - Recheck calcium levels in a.m.  Osteoporosis: Patient reported to be on Boniva and has had previous pathological fractures the past.  Hypothyroidism: Last TSH noted to be 8.75 on 12/24/2017.  Could be possible cause of frequent falls. - Check TSH - Continue levothyroxine  DVT prophylaxis:  Coumadin Code Status: Full  Family Communication: Discussed plan of care with the patient family  present at bedside Disposition Plan: To be determined Consults called: Orthopedics Admission status: Observation  Norval Morton MD [facility] Pager [phone]   If 7PM-7AM, please contact night-coverage [email or web address] Password [email or web address]  05/14/2018, 3:47 AM

## 2018-05-14 NOTE — Care Management Note (Signed)
Case Management Note  Patient Details  Name: Courtney [name] MRN: [id number] Date of Birth: 03-01-1929  Subjective/Objective:  85 yr old female s/p fall, sustained fracture of right humerus, non operable. Patient will discharge home with arm in sling.                    Action/Plan: Patient has dementia per her son [name], he is her care provider, patient lives with him as well. Choice for home health agency offered, referral called to Neoma Laming, [place] Liaison. David's cell is [phone].   Expected Discharge Date:    05/15/18              Expected Discharge Plan:  [place]  In-House Referral:  NA  Discharge planning Services  CM Consult  Post Acute Care Choice:  Home Health Choice offered to:  Adult Children  DME Arranged:  N/A DME Agency:  NA  HH Arranged:  PT, OT HH Agency:  [facility]  Status of Service:  Completed, signed off  If discussed at [place] of Stay Meetings, dates discussed:    Additional Comments:  Ninfa Meeker, RN 05/14/2018, 2:05 PM

## 2018-05-14 NOTE — Progress Notes (Signed)
Made bedside RN aware of below which should be shared with night staff and nurse tech:  Patient needs to be fed for dinner and breakfast, she can not feed herself. Please plan accordingly when food is served on floor.  Patient's pad needs to be checked consistently as she is incontinent and will not call nurse due to severe dementia.   Patient needs to be verbally asked about how her pain is/how she is doing as she will not speak up for any needs she may have until asked.   [place], [place]

## 2018-05-14 NOTE — Progress Notes (Signed)
ANTICOAGULATION CONSULT NOTE - Initial Consult  Pharmacy Consult for Coumadin Indication: Afib and MVR w/ h/o CVA  Allergies  Allergen Reactions  . Penicillins Swelling    Has patient had a PCN reaction causing immediate rash, facial/tongue/throat swelling, SOB or lightheadedness with hypotension: Yes Has patient had a PCN reaction causing severe rash involving mucus membranes or skin necrosis: No Has patient had a PCN reaction that required hospitalization No Has patient had a PCN reaction occurring within the last 10 years: No If all of the above answers are "NO", then may proceed with Cephalosporin use. Whole body "swelled up" and had to go to the hospital    Vital Signs: Temp: 97.7 F (36.5 C) (10/09 2254) Temp Source: Oral (10/09 2254) BP: 130/67 (10/10 0230) Pulse Rate: 60 (10/10 0230)  Labs: Recent Labs    05/12/18 1333 05/13/18 2148  HGB  --  12.6  HCT  --  41.4  PLT  --  184  LABPROT  --  25.6*  INR 2.6 2.36  CREATININE  --  0.98    Medical History: Past Medical History:  Diagnosis Date  . Biatrial enlargement 12/01/2015  . Chronic anticoagulation    coumadin, CHADS2VASC=6  . History of mitral valve replacement   . Hypertension   . Hypothyroidism   . Osteoporosis    on Boniva  . Permanent atrial fibrillation   . Pulmonary hypertension, moderate to severe ([place]) 12/01/2015  . Stroke [facility])    on Clopidrigel  . UTI (lower urinary tract infection) 07/2015   n&v; AKI  . Vascular dementia [facility])     Assessment: 84yo female presents after fall and resultant proximal humerus fracture, no surgical intervention planned, to continue Coumadin for Afib and MVR during admission; current INR slightly below goal, was at goal 2d ago at outpt clinic.  Goal of Therapy:  INR 2.5-3.5   Plan:  Will give boosted Coumadin dose of 6mg  today and monitor INR for dose adjustments.  Wynona Neat, PharmD, BCPS  05/14/2018,3:05 AM

## 2018-05-14 NOTE — ED Notes (Signed)
Sling has been place on pt.right arm.

## 2018-05-14 NOTE — ED Provider Notes (Signed)
Patient presented to the ER with fall.  Patient has a history of frequent falls, is supposed to use a walker but often neglects to use the walker.  She is complaining of pain in her right side after a fall tonight.  Face to face Exam: HEENT - PERRLA; ecchymosis and swelling around the right eye; small abrasion right forehead Lungs - CTAB Heart - RRR, no M/R/G Abd - S/NT/ND Neuro - alert, oriented x3 Musculoskeletal -tenderness, swelling, decreased range of motion right shoulder  Plan: Patient with frequent falls presents after another fall.  She is on Coumadin because of atrial fibrillation.  Patient does have comminuted fracture of the proximal humerus.  Dr. Doreatha Martin confirms that no surgical intervention is necessary.  She does, however, have significant swelling, likely hemarthrosis.  Patient normally uses a walker to ambulate, cannot use a walker with a fracture, will have difficulty with ambulation and will require hospitalization for physical therapy consultation.   Orpah Greek, MD 05/14/18 Re[place] Agar

## 2018-05-14 NOTE — Evaluation (Signed)
Physical Therapy Evaluation Patient Details Name: Courtney [name] MRN: [id number] DOB: [birth year] Today's Date: 05/14/2018   History of Present Illness  Patietn is an 84 y/o female presenting to [facility] ED on 05/13/18 s/p mechanical fall with resultant R shoulder communicated fracture with impaction. CT scan of the head and cervical spine showed no acute intracranial or fracture.  Maxillofacial CT scan was obtained which did reveal signs of a nasal tip fracture. Orthopedics recommend sling use currently. PMH significant for chronic atrial fibrillation on Coumadin, HTN, CVA, MVR, osteoporosis, dementia, and frequent falls.    Clinical Impression  Courtney [name] is a very pleasant 84 y/o female admitted with the above listed diagnosis. Patient is a poor historian with 2 nieces at bedside providing PLOF as able. PTA, patient lived with her son and was Mod I with RW vs no AD. Patient today requiring general Min-Mod A for bed mobility and transfers. Education on sling use and assumed NWB of R UE to patient - unsure of true carryover. Unsteadiness noted throughout transfer with patient stating a fear of falling. Will recommend SNF at discharge to progress safe functional mobility prior to return home.     Follow Up Recommendations SNF;Supervision/Assistance - 24 hour    Equipment Recommendations  Other (comment)(TBD)    Recommendations for Other Services OT consult     Precautions / Restrictions Precautions Precautions: Fall Required Braces or Orthoses: Sling Restrictions Weight Bearing Restrictions: Yes RUE Weight Bearing: (assumed NWB) Other Position/Activity Restrictions: R UE in sling      Mobility  Bed Mobility Overal bed mobility: Needs Assistance Bed Mobility: Supine to Sit     Supine to sit: Mod assist;HOB elevated     General bed mobility comments: use of bed pad for hips to EOB; verbal cueing to keep R UE still  Transfers Overall transfer level: Needs assistance Equipment used: 1 person  hand held assist Transfers: Sit to/from [facility] Sit to Stand: Mod assist Stand pivot transfers: Min assist       General transfer comment: Mod A to power up at bedside with patient preferring a posterior lean and weight bearing through heels only. Min A for stand pivot to chair. Patient stating a fear of falling.  Ambulation/Gait             General Gait Details: deferred  Stairs            Wheelchair Mobility    Modified Rankin (Stroke Patients Only)       Balance Overall balance assessment: Needs assistance Sitting-balance support: Single extremity supported;Feet supported Sitting balance-Leahy Scale: Fair     Standing balance support: Single extremity supported;During functional activity Standing balance-Leahy Scale: Poor Standing balance comment: reliant on external support                             Pertinent Vitals/Pain Pain Assessment: Faces Faces Pain Scale: Hurts little more Pain Location: R shoulder Pain Descriptors / Indicators: Discomfort;Guarding;Grimacing Pain Intervention(s): Limited activity within patient's tolerance;Monitored during session;Repositioned    Home Living Family/patient expects to be discharged to:: Private residence Living Arrangements: Children(son) Available Help at Discharge: Family Type of Home: House         Home Equipment: Environmental consultant - 2 wheels(does not use with regularity) Additional Comments: unsure of true home environment as patient is a poor historian and son is not present     Prior Function Level of Independence: Independent with assistive device(s)  Comments: has RW but does not use per family     Hand Dominance        Extremity/Trunk Assessment   Upper Extremity Assessment Upper Extremity Assessment: Defer to OT evaluation    Lower Extremity Assessment Lower Extremity Assessment: Generalized weakness    Cervical / Trunk Assessment Cervical / Trunk  Assessment: Kyphotic  Communication   Communication: No difficulties  Cognition Arousal/Alertness: Awake/alert Behavior During Therapy: WFL for tasks assessed/performed Overall Cognitive Status: History of cognitive impairments - at baseline                                 General Comments: patients family reporting a hsitory of dementia. Patient able to state her name and DOB. Does state that she has never been to this hospital. When asking questions, patient often looks at PT and smiles.       General Comments      Exercises     Assessment/Plan    PT Assessment Patient needs continued PT services  PT Problem List Decreased strength;Decreased activity tolerance;Decreased balance;Decreased mobility;Decreased knowledge of use of DME;Decreased safety awareness;Decreased knowledge of precautions       PT Treatment Interventions DME instruction;Gait training;Functional mobility training;Therapeutic activities;Therapeutic exercise;Balance training;Patient/family education    PT Goals (Current goals can be found in the Care Plan section)  Acute Rehab PT Goals Patient Stated Goal: none stated PT Goal Formulation: With patient Time For Goal Achievement: 05/28/18 Potential to Achieve Goals: Fair    Frequency Min 3X/week   Barriers to discharge        Co-evaluation               AM-PAC PT "6 Clicks" Daily Activity  Outcome Measure Difficulty turning over in bed (including adjusting bedclothes, sheets and blankets)?: Unable Difficulty moving from lying on back to sitting on the side of the bed? : Unable Difficulty sitting down on and standing up from a chair with arms (e.g., wheelchair, bedside commode, etc,.)?: Unable Help needed moving to and from a bed to chair (including a wheelchair)?: A Lot Help needed walking in hospital room?: A Lot Help needed climbing 3-5 steps with a railing? : Total 6 Click Score: 8    End of Session Equipment Utilized During  Treatment: Gait belt;Other (comment)(sling) Activity Tolerance: Patient tolerated treatment well Patient left: in chair;with call bell/phone within reach;with chair alarm set;with family/visitor present Nurse Communication: Mobility status PT Visit Diagnosis: Unsteadiness on feet (R26.81);Other abnormalities of gait and mobility (R26.89);Repeated falls (R29.6);Muscle weakness (generalized) (M62.81)    Time: 5573-2202 PT Time Calculation (min) (ACUTE ONLY): 28 min   Charges:   PT Evaluation $PT Eval Moderate Complexity: 1 Mod PT Treatments $Therapeutic Activity: 8-22 mins      Lanney Gins, PT, DPT Supplemental Physical Therapist 05/14/18 9:00 AM Pager: [phone] Office: [phone]

## 2018-05-14 NOTE — Clinical Social Work Note (Signed)
Clinical Social Work Assessment  Patient Details  Name: Courtney [name] MRN: [id number] Date of Birth: 1929/03/04  Date of referral:  05/14/18               Reason for consult:  Discharge Planning                Permission sought to share information with:  Case Manager, Facility Sport and exercise psychologist, Family Supports Permission granted to share information::  Yes, Verbal Permission Granted  Name::     Bennett Vanscyoc  Agency::  SNFs  Relationship::  Son  Contact Information:  [phone]  Housing/Transportation Living arrangements for the past 2 months:  Single Family Home(living with son who has been caregiver) Source of Information:  Patient Patient Interpreter Needed:  None Criminal Activity/Legal Involvement Pertinent to Current Situation/Hospitalization:  No - Comment as needed Significant Relationships:  Adult Children Lives with:  Adult Children Do you feel safe going back to the place where you live?  No Need for family participation in patient care:  Yes (Comment)  Care giving concerns:  CSW received referral for possible SNF placement at time of discharge. Spoke with patient regarding possibility of SNF placement . Patient's son is currently unable to care for her at their home given patient's current needs and fall risk.  Patient and  son  expressed understanding of PT recommendation and are agreeable to SNF placement at time of discharge. CSW to continue to follow and assist with discharge planning needs.     Social Worker assessment / plan:  Spoke with patient and son concerning possibility of rehab at [facility] before returning home.    Employment status:  Retired Nurse, adult PT Recommendations:  [place] / Referral to community resources:  [place]  Patient/Family's Response to care:  Patient and son recognize need for rehab before returning home and are agreeable to a SNF in [place] area. They  report preference for   [facility] or [facility] . CSW explained insurance authorization process. Patient's family reported that they want patient to get stronger to be able to come back home.    Patient/Family's Understanding of and Emotional Response to Diagnosis, Current Treatment, and Prognosis:  Patient/family is realistic regarding therapy needs and expressed being hopeful for SNF placement. Patient expressed understanding of CSW role and discharge process as well as medical condition. No questions/concerns about plan or treatment.    Emotional Assessment Appearance:  Appears stated age Attitude/Demeanor/Rapport:  Engaged, Self-Confident, Gracious Affect (typically observed):  Accepting Orientation:  Oriented to Self, Oriented to Place, Oriented to  Time, Oriented to Situation Alcohol / Substance use:  Not Applicable Psych involvement (Current and /or in the community):  No (Comment)  Discharge Needs  Concerns to be addressed:  Discharge Planning Concerns Readmission within the last 30 days:  No Current discharge risk:  Dependent with Mobility Barriers to Discharge:  Continued Medical Work up   [employer], [place] 05/14/2018, 4:20 PM

## 2018-05-14 NOTE — NC FL2 (Signed)
[place] LEVEL OF CARE SCREENING TOOL     IDENTIFICATION  Patient Name: Courtney [name] Birthdate: 14-Dec-1928 Sex: female Admission Date (Current Location): 05/13/2018  [facility] and [place] Number:  [employer] and Address:  The [place], [place] [address], [place], [place] [address]      Provider Number: [id number]  Attending Physician Name and Address:  Edwin Dada, *  Relative Name and Phone Number:  [name] (son) [phone]    Current Level of Care: Hospital Recommended Level of Care: [place] Prior Approval Number:    Date Approved/Denied:   PASRR Number: [id number]  Discharge Plan: SNF    Current Diagnoses: Patient Active Problem List   Diagnosis Date Noted  . Frequent falls 05/14/2018  . Hypercalcemia 05/14/2018  . Closed comminuted right humeral fracture 05/14/2018  . Periorbital hematoma of right eye 05/14/2018  . Encounter for therapeutic drug monitoring 03/11/2018  . T12 compression fracture ([place]) 04/22/2017  . Dementia, multi-infarct ([place]) 04/22/2017  . Cardioembolic stroke ([place]) 66/01/3015  . B12 deficiency 04/22/2017  . Cough 12/16/2015  . Osteoporosis 12/05/2015  . Biatrial enlargement 12/01/2015  . Pulmonary hypertension, moderate to severe ([place]) 12/01/2015  . Closed fracture of intertrochanteric section of femur ([place])   . Chronic atrial fibrillation 11/30/2015  . Hypothyroidism 11/29/2015  . Thrombocytopenia ([place]) 07/13/2015  . Chronic anticoagulation 07/13/2015  . S/P mitral valve replacement 07/13/2015  . Vascular dementia, due to brain bleed from elevated INR, not due to ischemic strokes.  07/12/2015  . Essential hypertension 07/12/2015    Orientation RESPIRATION BLADDER Height & Weight     Self, Place, Time, Situation  Normal Incontinent Weight:   Height:     BEHAVIORAL SYMPTOMS/MOOD NEUROLOGICAL BOWEL NUTRITION STATUS      Incontinent Diet(see discharge summary)   AMBULATORY STATUS COMMUNICATION OF NEEDS Skin   Extensive Assist Verbally("does not complain" needs to be asked how she's doing) Bruising(Right arm, leg, shoulder ecchymosis)                       Personal Care Assistance Level of Assistance  Bathing, Feeding, Dressing, Total care Bathing Assistance: Maximum assistance Feeding assistance: Limited assistance Dressing Assistance: Maximum assistance Total Care Assistance: Maximum assistance   Functional Limitations Info  Sight, Hearing, Speech Sight Info: Adequate Hearing Info: Adequate Speech Info: Adequate    SPECIAL CARE FACTORS FREQUENCY  PT (By licensed PT), OT (By licensed OT)     PT Frequency: 5x weekly OT Frequency: 5x weekly            Contractures Contractures Info: Not present    Additional Factors Info  Code Status, Allergies Code Status Info: Full Allergies Info: Allergies:  Penicillins           Current Medications (05/14/2018):  This is the current hospital active medication list Current Facility-Administered Medications  Medication Dose Route Frequency Provider Last Rate Last Dose  . amLODipine (NORVASC) tablet 2.5 mg  2.5 mg Oral Daily Smith, Rondell A, MD   2.5 mg at 05/14/18 1121  . atenolol (TENORMIN) tablet 12.5 mg  12.5 mg Oral Daily Smith, Rondell A, MD   12.5 mg at 05/14/18 1121  . clopidogrel (PLAVIX) tablet 75 mg  75 mg Oral Daily Tamala Julian, Rondell A, MD   75 mg at 05/14/18 1122  . HYDROcodone-acetaminophen (NORCO/VICODIN) 5-325 MG per tablet 1 tablet  1 tablet Oral Q4H PRN Norval Morton, MD   1 tablet at  05/14/18 0520  . levothyroxine (SYNTHROID, LEVOTHROID) tablet 100 mcg  100 mcg Oral QAC breakfast Smith, Rondell A, MD      . lisinopril (PRINIVIL,ZESTRIL) tablet 2.5 mg  2.5 mg Oral Daily Smith, Rondell A, MD   2.5 mg at 05/14/18 1120  . ondansetron (ZOFRAN) tablet 4 mg  4 mg Oral Q6H PRN Fuller Plan A, MD       Or  . ondansetron (ZOFRAN) injection 4 mg  4 mg Intravenous Q6H PRN  Fuller Plan A, MD      . Derrill Memo ON 05/15/2018] triamterene-hydrochlorothiazide (MAXZIDE-25) 37.5-25 MG per tablet 1 tablet  1 tablet Oral QODAY Smith, Rondell A, MD      . warfarin (COUMADIN) tablet 6 mg  6 mg Oral ONCE-1800 Laren Everts, RPH      . Warfarin - Pharmacist Dosing Inpatient   Does not apply q1800 Laren Everts, [facility]         Discharge Medications: Please see discharge summary for a list of discharge medications.  Relevant Imaging Results:  Relevant Lab Results:   Additional Information SSN: [id number]  Alberteen Sam, LCSW

## 2018-05-15 ENCOUNTER — Other Ambulatory Visit: Payer: Self-pay

## 2018-05-15 DIAGNOSIS — I10 Essential (primary) hypertension: Secondary | ICD-10-CM | POA: Diagnosis not present

## 2018-05-15 DIAGNOSIS — R296 Repeated falls: Secondary | ICD-10-CM | POA: Diagnosis not present

## 2018-05-15 DIAGNOSIS — S42424A Nondisplaced comminuted supracondylar fracture without intercondylar fracture of right humerus, initial encounter for closed fracture: Secondary | ICD-10-CM | POA: Diagnosis not present

## 2018-05-15 LAB — PROTIME-INR
INR: 3.48
Prothrombin Time: 34.7 seconds — ABNORMAL HIGH (ref 11.4–15.2)

## 2018-05-15 MED ORDER — WARFARIN SODIUM 1 MG PO TABS: 1.5000 mg | ORAL_TABLET | Freq: Every day | ORAL | 0 refills | Status: DC

## 2018-05-15 MED ORDER — WARFARIN SODIUM 3 MG PO TABS: ORAL_TABLET | ORAL | 0 refills | Status: DC

## 2018-05-15 MED ORDER — HYDROCODONE-ACETAMINOPHEN 5-325 MG PO TABS: 1.0000 | ORAL_TABLET | ORAL | 0 refills | Status: DC | PRN

## 2018-05-15 MED ORDER — WARFARIN SODIUM 4 MG PO TABS: ORAL_TABLET | ORAL | 0 refills | Status: DC

## 2018-05-15 MED ORDER — SENNOSIDES-DOCUSATE SODIUM 8.6-50 MG PO TABS: 1.0000 | ORAL_TABLET | Freq: Every day | ORAL | Status: DC

## 2018-05-15 MED ORDER — SENNOSIDES-DOCUSATE SODIUM 8.6-50 MG PO TABS: 1.0000 | ORAL_TABLET | Freq: Every day | ORAL | 0 refills | Status: DC

## 2018-05-15 NOTE — Plan of Care (Signed)

## 2018-05-15 NOTE — Clinical Social Work Placement (Signed)
   CLINICAL SOCIAL WORK PLACEMENT  NOTE  Date:  05/15/2018  Patient Details  Name: Courtney [name] MRN: [id number] Date of Birth: [birth year]  Clinical Social Work is seeking post-discharge placement for this patient at the [place] level of care (*CSW will initial, date and re-position this form in  chart as items are completed):  Yes   Patient/family provided with [place] Work Department's list of facilities offering this level of care within the geographic area requested by the patient (or if unable, by the patient's family).  Yes   Patient/family informed of their freedom to choose among providers that offer the needed level of care, that participate in Medicare, Medicaid or managed care program needed by the patient, have an available bed and are willing to accept the patient.      Patient/family informed of [facility]'s ownership interest in [facility] and [facility], as well as of the fact that they are under no obligation to receive care at these facilities.  PASRR submitted to EDS on       PASRR number received on 05/14/18     Existing PASRR number confirmed on       FL2 transmitted to all facilities in geographic area requested by pt/family on 05/14/18     FL2 transmitted to all facilities within larger geographic area on       Patient informed that his/her managed care company has contracts with or will negotiate with certain facilities, including the following:        Yes   Patient/family informed of bed offers received.  Patient chooses bed at [facility]     Physician recommends and patient chooses bed at      Patient to be transferred to [facility] on 05/15/18.  Patient to be transferred to facility by [facility]AR     Patient family notified on 05/15/18 of transfer.  Name of family member notified:  [name] (son)     PHYSICIAN       Additional Comment:     _______________________________________________ Alberteen Sam, LCSW 05/15/2018, 2:17 PM

## 2018-05-15 NOTE — Discharge Summary (Signed)
Physician Discharge Summary  Courtney [name] GGY:[id number] DOB: [birth year] DOA: 05/13/2018  PCP: Patient, No Pcp Per  Admit date: 05/13/2018 Discharge date: 05/15/2018  Admitted From: Home  Disposition:  SNF   Recommendations for Outpatient Follow-up:  1. Follow up with Orthopedics, Dr. Doreatha Martin in 2 weeks 2. Given HALF DOSE warfarin evening 10/11 and 10/12, then restart regular home dose (3 mg every night except 4 mg Weds) on 10/13 and check INR in 3 days and adjust warfarin as needed   Home Health: N/A  Equipment/Devices: TBD at SNF  Discharge Condition: Good  CODE STATUS: FULL Diet recommendation: Regular  Brief/Interim Summary: Courtney [name] is an 84 y.o. F with dementia but community-dwelling, chronic AF on warfarin, HTN, CVA without residual deficits, osteoporosis who presents after unwitnessed fall with right arm pain.  Found to have right humerus fracture, nasal tip fracture.         Principal discharge diagnosis: Right humeral fracture   Discharge Diagnoses:   Right humeral fracture Right periorbital hematoma Nasal tip fracture Evaluated by Orthopedics, plan for nonoperative mgmt of humerus fracture.  Patient to follow up with Dr. Doreatha Martin in 2 weeks.  No weight bearing on RUE.    Chronic atrial fibrillation Mitral valve replacement Continued on warfarin, INR slightly supratherapeutic on day of discharge. -Dosing per pharmacy, reduced dose for two days, then resume home dose, check INR in 3 days   Hypertension BP stable at hospital.  Dementia At baseline  History of CVA without residual deficits No acute deficits.  Managed on Plavix and Warfarin by Dr. Court Joy office.  Hypothyroidism TSH elevated.  In process of transition to new thyroid medication.  Please repeat TSH in 3 months on new Armour.  Hypercalcemia Mildly elevated at admission, repeat calcium normal.         Discharge Instructions  Discharge Instructions    Diet general   Complete by:  As  directed    Discharge instructions   Complete by:  As directed    Please follow up with Dr. Doreatha Martin in 2 weeks Call his office at [facility] for an appointment:  [phone] Continue all of your regularly scheduled medications without change.  Your warfarin level (your INR) is slightly high today.   Take a half dose today and tomorrow (Friday and Saturday) THEN resume your normal warfarin dose on Sunday (3mg  MTThFSS every day but Wednesday and 4mg  on Wednesday) and have the nursing facility check your INR on Monday.   Increase activity slowly   Complete by:  As directed      Allergies as of 05/15/2018      Reactions   Penicillins Swelling   Has patient had a PCN reaction causing immediate rash, facial/tongue/throat swelling, SOB or lightheadedness with hypotension: Yes Has patient had a PCN reaction causing severe rash involving mucus membranes or skin necrosis: No Has patient had a PCN reaction that required hospitalization No Has patient had a PCN reaction occurring within the last 10 years: No If all of the above answers are "NO", then may proceed with Cephalosporin use. Whole body "swelled up" and had to go to the hospital      Medication List    STOP taking these medications   levothyroxine 100 MCG tablet Commonly known as:  SYNTHROID, LEVOTHROID     TAKE these medications   acetaminophen 500 MG tablet Commonly known as:  TYLENOL Take 500 mg by mouth every 6 (six) hours as needed for mild pain.   amLODipine  2.5 MG tablet Commonly known as:  NORVASC Take 1 tablet (2.5 mg total) by mouth daily.   atenolol 25 MG tablet Commonly known as:  TENORMIN Take 0.5 tablets (12.5 mg total) by mouth daily.   b complex vitamins tablet Take 1 tablet by mouth daily.   clopidogrel 75 MG tablet Commonly known as:  PLAVIX Take 1 tablet (75 mg total) by mouth daily.   HYDROcodone-acetaminophen 5-325 MG tablet Commonly known as:  NORCO/VICODIN Take 1 tablet  by mouth every 4 (four) hours as needed for moderate pain.   ibandronate 150 MG tablet Commonly known as:  BONIVA Take 1 tablet (150 mg total) by mouth every 30 (thirty) days. Take in the morning with a full glass of water, on an empty stomach, and do not take anything else by mouth or lie down for the next 30 min.   lisinopril 2.5 MG tablet Commonly known as:  PRINIVIL,ZESTRIL Take 1 tablet (2.5 mg total) by mouth daily.   senna-docusate 8.6-50 MG tablet Commonly known as:  Senokot-S Take 1 tablet by mouth at bedtime.   thyroid 90 MG tablet Commonly known as:  ARMOUR Take 90 mg by mouth daily.   triamterene-hydrochlorothiazide 37.5-25 MG capsule Commonly known as:  DYAZIDE Take 1 each (1 capsule total) by mouth every other day.   vitamin C 250 MG tablet Commonly known as:  ASCORBIC ACID Take 250 mg by mouth daily.   VITAMIN D (ERGOCALCIFEROL) PO Take 250 mg by mouth daily.   warfarin 1 MG tablet Commonly known as:  COUMADIN Take as directed. If you are unsure how to take this medication, talk to your nurse or doctor. Original instructions:  Take 1.5 tablets (1.5 mg total) by mouth daily at 6 PM for 2 days. What changed:  You were already taking a medication with the same name, and this prescription was added. Make sure you understand how and when to take each.   warfarin 4 MG tablet Commonly known as:  COUMADIN Take as directed. If you are unsure how to take this medication, talk to your nurse or doctor. Original instructions:  Take 4 mg weekly on Wednesday. What changed:  See the new instructions.   warfarin 3 MG tablet Commonly known as:  COUMADIN Take as directed. If you are unsure how to take this medication, talk to your nurse or doctor. Original instructions:  Take warfarin 3 mg Mon, Tues, Thu, Fri, Sat, Sun (every night except Wednesday) Start taking on:  05/17/2018 What changed:    See the new instructions.  These instructions start on 05/17/2018. If you are  unsure what to do until then, ask your doctor or other care provider.       Contact information for follow-up providers    Haddix, Thomasene Lot, MD. Schedule an appointment as soon as possible for a visit in 2 week(s).   Specialty:  Orthopedic Surgery Contact information: [address] [place] [address] [phone]        [facility] Follow [facility].   Specialty:  [place] Why:  A representative from [facility] will contact you to arrange start date and time for your therapy. Contact information: [place] [address] [phone]            Contact information for after-discharge care    [facility] .   Service:  Skilled Nursing Contact information: [address] [facility]  [place] [address] [phone]                 Allergies  Allergen Reactions  . Penicillins Swelling    Has patient had a PCN reaction causing immediate rash, facial/tongue/throat swelling, SOB or lightheadedness with hypotension: Yes Has patient had a PCN reaction causing severe rash involving mucus membranes or skin necrosis: No Has patient had a PCN reaction that required hospitalization No Has patient had a PCN reaction occurring within the last 10 years: No If all of the above answers are "NO", then may proceed with Cephalosporin use. Whole body "swelled up" and had to go to the hospital    Consultations:  Orthopedics   Procedures/Studies: Dg Shoulder Right  Result Date: 05/13/2018 CLINICAL DATA:  Obvious deformity of the right shoulder after a fall. EXAM: RIGHT HUMERUS - 2+ VIEW; RIGHT SHOULDER - 2+ VIEW COMPARISON:  None. FINDINGS: Patient positioning limits examination. Two views of the right humerus and two views of the right shoulder are obtained. There is a comminuted fracture of the right humeral head and neck with impaction and varus angulation of the fracture  fragments. Mild displacement of the greater tuberosity fragment. No glenohumeral or acromioclavicular dislocation. The distal humerus appears intact. Mild soft tissue swelling about the upper arm. No focal bone lesion or bone destruction. IMPRESSION: Comminuted fracture of the right humeral head and neck with impaction and varus angulation of the fracture fragments. Electronically Signed   By: Lucienne Capers M.D.   On: 05/13/2018 22:52   Ct Head Wo Contrast  Result Date: 05/13/2018 CLINICAL DATA:  Fall, left periorbital swelling. Anti coagulation. Dementia. EXAM: CT HEAD WITHOUT CONTRAST CT CERVICAL SPINE WITHOUT CONTRAST TECHNIQUE: Multidetector CT imaging of the head and cervical spine was performed following the standard protocol without intravenous contrast. Multiplanar CT image reconstructions of the cervical spine were also generated. COMPARISON:  05/29/2017 FINDINGS: CT HEAD FINDINGS Brain: Remote lacunar infarcts in the upper cerebellum, left lentiform nucleus, and head of the left caudate nucleus. Overall this is similar to 10/20 12/2016. Small lacunar infarct in the head of the right caudate nucleus. Remote right frontal lobe infarct on image 24/3, not changed from prior. Periventricular white matter and corona radiata hypodensities favor chronic ischemic microvascular white matter disease. No intracranial hemorrhage, mass lesion, or acute CVA. Vascular: There is atherosclerotic calcification of the cavernous carotid arteries bilaterally. Skull: Unremarkable Sinuses/Orbits: Right periorbital hematoma without intraorbital abnormality. The sinuses appear clear. Other: No supplemental non-categorized findings. CT CERVICAL SPINE FINDINGS Alignment: 2 mm degenerative retrolisthesis at C4-5. Skull base and vertebrae: Facet and mild interbody fusion at C2-3. Calcification in the transverse ligament of C1. Spurring and loss of articular space at the anterior C1-2 articulation. No cervical spine fracture or  acute subluxation. Loss of disc height and associated spurring at all levels between C4 and T1. Soft tissues and spinal canal: Common carotid atherosclerotic calcifications. Disc levels: Osseous foraminal stenosis on the left at C5-6 due to uncinate and facet spurring. Upper chest: Unremarkable Other: No supplemental non-categorized findings. IMPRESSION: 1. No acute intracranial findings and no acute cervical spine findings. 2. Right periorbital soft tissue swelling/hematoma without intraorbital abnormality observed. 3. Cervical spondylosis with left foraminal impingement at C5-6. 4. Atherosclerosis. 5. Scattered small infarcts in the upper cerebellum and basal ganglia. Small right frontal chronic infarct. Periventricular white matter and corona radiata hypodensities favor chronic ischemic microvascular white matter disease. Electronically Signed   By: Van Clines M.D.   On: 05/13/2018 22:23  Ct Cervical Spine Wo Contrast  Result Date: 05/13/2018 CLINICAL DATA:  Fall, left periorbital swelling. Anti coagulation. Dementia. EXAM: CT HEAD WITHOUT CONTRAST CT CERVICAL SPINE WITHOUT CONTRAST TECHNIQUE: Multidetector CT imaging of the head and cervical spine was performed following the standard protocol without intravenous contrast. Multiplanar CT image reconstructions of the cervical spine were also generated. COMPARISON:  05/29/2017 FINDINGS: CT HEAD FINDINGS Brain: Remote lacunar infarcts in the upper cerebellum, left lentiform nucleus, and head of the left caudate nucleus. Overall this is similar to 10/20 12/2016. Small lacunar infarct in the head of the right caudate nucleus. Remote right frontal lobe infarct on image 24/3, not changed from prior. Periventricular white matter and corona radiata hypodensities favor chronic ischemic microvascular white matter disease. No intracranial hemorrhage, mass lesion, or acute CVA. Vascular: There is atherosclerotic calcification of the cavernous carotid arteries  bilaterally. Skull: Unremarkable Sinuses/Orbits: Right periorbital hematoma without intraorbital abnormality. The sinuses appear clear. Other: No supplemental non-categorized findings. CT CERVICAL SPINE FINDINGS Alignment: 2 mm degenerative retrolisthesis at C4-5. Skull base and vertebrae: Facet and mild interbody fusion at C2-3. Calcification in the transverse ligament of C1. Spurring and loss of articular space at the anterior C1-2 articulation. No cervical spine fracture or acute subluxation. Loss of disc height and associated spurring at all levels between C4 and T1. Soft tissues and spinal canal: Common carotid atherosclerotic calcifications. Disc levels: Osseous foraminal stenosis on the left at C5-6 due to uncinate and facet spurring. Upper chest: Unremarkable Other: No supplemental non-categorized findings. IMPRESSION: 1. No acute intracranial findings and no acute cervical spine findings. 2. Right periorbital soft tissue swelling/hematoma without intraorbital abnormality observed. 3. Cervical spondylosis with left foraminal impingement at C5-6. 4. Atherosclerosis. 5. Scattered small infarcts in the upper cerebellum and basal ganglia. Small right frontal chronic infarct. Periventricular white matter and corona radiata hypodensities favor chronic ischemic microvascular white matter disease. Electronically Signed   By: Van Clines M.D.   On: 05/13/2018 22:23   Dg Humerus Right  Result Date: 05/13/2018 CLINICAL DATA:  Obvious deformity of the right shoulder after a fall. EXAM: RIGHT HUMERUS - 2+ VIEW; RIGHT SHOULDER - 2+ VIEW COMPARISON:  None. FINDINGS: Patient positioning limits examination. Two views of the right humerus and two views of the right shoulder are obtained. There is a comminuted fracture of the right humeral head and neck with impaction and varus angulation of the fracture fragments. Mild displacement of the greater tuberosity fragment. No glenohumeral or acromioclavicular  dislocation. The distal humerus appears intact. Mild soft tissue swelling about the upper arm. No focal bone lesion or bone destruction. IMPRESSION: Comminuted fracture of the right humeral head and neck with impaction and varus angulation of the fracture fragments. Electronically Signed   By: Lucienne Capers M.D.   On: 05/13/2018 22:52   Ct Maxillofacial Wo Contrast  Result Date: 05/13/2018 CLINICAL DATA:  Patient sustained a fall without loss of consciousness. EXAM: CT MAXILLOFACIAL WITHOUT CONTRAST TECHNIQUE: Multidetector CT imaging of the maxillofacial structures was performed. Multiplanar CT image reconstructions were also generated. COMPARISON:  05/13/2018 head CT FINDINGS: Osseous: Fracture of the nasal tip. No acute maxillofacial fracture otherwise noted. Mandibles are maintained with the condyles seated within the respective temporomandibular joints. Orbits: Intact Sinuses: Nonacute Soft tissues: Soft tissue swelling about the right orbit. Limited intracranial: Atrophy with chronic small vessel ischemia. IMPRESSION: Nasal tip fracture.  Periorbital soft tissue swelling on right. Electronically Signed   By: Ashley Royalty M.D.   On: 05/13/2018 23:59  Subjective: Arm sore but no other complaints.  No confusion, fever.  No chest pain, cough, dyspnea, sputum.  No malaise, vomiting.  Discharge Exam: Vitals:   05/14/18 1925 05/15/18 0419  BP: (!) 143/69 134/69  Pulse: 60 66  Resp: 18 15  Temp: (!) 97.4 F (36.3 C) 98.2 F (36.8 C)  SpO2: 97% 95%   Vitals:   05/14/18 0432 05/14/18 1100 05/14/18 1925 05/15/18 0419  BP: 126/87 130/61 (!) 143/69 134/69  Pulse: 68 61 60 66  Resp: 18  18 15   Temp: 98.1 F (36.7 C) 97.7 F (36.5 C) (!) 97.4 F (36.3 C) 98.2 F (36.8 C)  TempSrc: Oral Oral Axillary Oral  SpO2: 98% 95% 97% 95%    General: Pt is alert, awake, not in acute distress, sitting in recliner, cheerful. HEENT: Bruising to right eye stable. Cardiovascular: RRR, S1/S2 +, sys  murmur noted, no rubs, no gallops Respiratory: CTA bilaterally, no wheezing, no rhonchi Abdominal: Soft, NT, ND, bowel sounds + Extremities: no edema, no cyanosis, right arm in sling, guarding    The results of significant diagnostics from this hospitalization (including imaging, microbiology, ancillary and laboratory) are listed below for reference.     Microbiology: Recent Results (from the past 240 hour(s))  MRSA PCR Screening     Status: None   Collection Time: 05/14/18  6:13 AM  Result Value Ref Range Status   MRSA by PCR NEGATIVE NEGATIVE Final    Comment:        The GeneXpert MRSA Assay (FDA approved for NASAL specimens only), is one component of a comprehensive MRSA colonization surveillance program. It is not intended to diagnose MRSA infection nor to guide or monitor treatment for MRSA infections. Performed at [facility], [place] [address], [place],  [address]      Labs: BNP (last 3 results) No results for input(s): BNP in the last 8760 hours. Basic Metabolic Panel: Recent Labs  Lab 05/13/18 2148 05/14/18 0320  NA 139 140  K 3.6 4.2  CL 105 106  CO2 26 27  GLUCOSE 114* 142*  BUN 24* 21  CREATININE 0.98 0.98  CALCIUM 10.4* 9.7   Liver Function Tests: No results for input(s): AST, ALT, ALKPHOS, BILITOT, PROT, ALBUMIN in the last 168 hours. No results for input(s): LIPASE, AMYLASE in the last 168 hours. No results for input(s): AMMONIA in the last 168 hours. CBC: Recent Labs  Lab 05/13/18 2148 05/14/18 0320  WBC 8.2 8.9  NEUTROABS 6.9  --   HGB 12.6 11.4*  HCT 41.4 38.4  MCV 88.8 89.7  PLT 184 155   Cardiac Enzymes: No results for input(s): CKTOTAL, CKMB, CKMBINDEX, TROPONINI in the last 168 hours. BNP: Invalid input(s): POCBNP CBG: No results for input(s): GLUCAP in the last 168 hours. D-Dimer No results for input(s): DDIMER in the last 72 hours. Hgb A1c No results for input(s): HGBA1C in the last 72 hours. Lipid Profile No  results for input(s): CHOL, HDL, LDLCALC, TRIG, CHOLHDL, LDLDIRECT in the last 72 hours. Thyroid function studies Recent Labs    05/13/18 2148  TSH 11.185*   Anemia work up No results for input(s): VITAMINB12, FOLATE, FERRITIN, TIBC, IRON, RETICCTPCT in the last 72 hours. Urinalysis    Component Value Date/Time   COLORURINE YELLOW 07/12/2015 1345   APPEARANCEUR HAZY (A) 07/12/2015 1345   LABSPEC 1.020 07/12/2015 1345   PHURINE 6.0 07/12/2015 1345   GLUCOSEU NEGATIVE 07/12/2015 1345   HGBUR LARGE (A) 07/12/2015 1345  BILIRUBINUR NEGATIVE 07/12/2015 1345   KETONESUR TRACE (A) 07/12/2015 1345   PROTEINUR 100 (A) 07/12/2015 1345   NITRITE POSITIVE (A) 07/12/2015 1345   LEUKOCYTESUR MODERATE (A) 07/12/2015 1345   Sepsis Labs Invalid input(s): PROCALCITONIN,  WBC,  LACTICIDVEN Microbiology Recent Results (from the past 240 hour(s))  MRSA PCR Screening     Status: None   Collection Time: 05/14/18  6:13 AM  Result Value Ref Range Status   MRSA by PCR NEGATIVE NEGATIVE Final    Comment:        The GeneXpert MRSA Assay (FDA approved for NASAL specimens only), is one component of a comprehensive MRSA colonization surveillance program. It is not intended to diagnose MRSA infection nor to guide or monitor treatment for MRSA infections. Performed at [facility], [place] [address], [place], [place] [address]      Time coordinating discharge: 40 minutes The [place] controlled substances registry was reviewed for this patient prior to filling the <5 days supply controlled substances script.      SIGNED:   Edwin Dada, MD  [facility] 05/15/2018, 1:50 PM

## 2018-05-15 NOTE — Progress Notes (Signed)
OT Cancellation Note  Patient Details Name: Courtney [name] MRN: [id number] DOB: 12-Nov-1928   Cancelled Treatment:    Reason Eval/Treat Not Completed: Other (comment)(defer OT evaluation to Next Venue of Care). Pt with discharge orders and PTAR pending - will defer OT evaluation to next venue of care  [name] 05/15/2018, 2:56 PM  Hulda Humphrey OTR/L [place] Pager: [phone] Office: [phone]

## 2018-05-15 NOTE — Progress Notes (Signed)
Called facility, gave report to [facility], Pt not in distress, all questions and concerns, called son and updated about transport, Pt discharged to facility via PTAR with belongings.

## 2018-05-15 NOTE — Progress Notes (Signed)
Patient will DC to: [facility] Anticipated DC date: 05/15/18 Family notified: [name] (son) Transport by: Corey Harold  Per MD patient ready for DC to [facility]. RN, patient, patient's family, and facility notified of DC. Discharge Summary sent to facility. RN given number for report [phone] 100 hall number. DC packet on chart. Ambulance transport requested for patient.  CSW signing off.  [place], [place]

## 2018-05-18 ENCOUNTER — Telehealth: Payer: Self-pay | Admitting: Orthopedic Surgery

## 2018-05-18 NOTE — Telephone Encounter (Signed)
Called back to [place] at [facility], left voice message to return call.

## 2018-05-18 NOTE — Telephone Encounter (Signed)
Call received from [facility] at [facility], states patient is to have orthopaedic follow up for fracture care, left shoulder; patient's family requests Dr Aline Brochure, however, patient's hospital notes from [facility] indicate to see Dr Doreatha Martin in [place]. Please review and advise. Ph [phone]

## 2018-05-18 NOTE — Telephone Encounter (Signed)
ASK IF THEY WILL SEE DR Luna Glasgow IT DOESN'T NEED SURGERY IF NIT I WILL SEE AFTER THEY SEE DR HADDIX ONE TIME

## 2018-05-19 NOTE — Telephone Encounter (Signed)
Spoke with Dr Luna Glasgow, with [facility], and with patient's son, Kimberli Winne - appointment scheduled.

## 2018-06-02 ENCOUNTER — Encounter: Payer: Self-pay | Admitting: Orthopaedic Surgery

## 2018-06-02 ENCOUNTER — Ambulatory Visit: Payer: Medicare Other | Admitting: Orthopaedic Surgery

## 2018-06-02 ENCOUNTER — Ambulatory Visit (INDEPENDENT_AMBULATORY_CARE_PROVIDER_SITE_OTHER): Payer: Medicare Other

## 2018-06-02 VITALS — BP 163/76 | HR 69 | Ht 63.0 in | Wt 117.0 lb

## 2018-06-02 DIAGNOSIS — S42291A Other displaced fracture of upper end of right humerus, initial encounter for closed fracture: Secondary | ICD-10-CM

## 2018-06-02 NOTE — Progress Notes (Signed)
Patient Courtney [name], female DOB:1929-04-20, 84 y.o. IWO:[id number]  Chief Complaint  Patient presents with  . Shoulder Pain    right s/p fall 05/13/18    HPI  Courtney [name] is a 84 y.o. female who fell at home on 05-13-18 and hurt her right shoulder.  She was seen in the ER.  I have reviewed the x-rays and report.  She is in a sling.  She is taken care of by her son at his home.  She hit her right face and has resolving ecchymosis there. She is in a sling still and pain is controlled.  She is chronically confused.   Body mass index is 20.73 kg/m.  ROS  Review of Systems  Constitutional: Positive for activity change.  HENT: Negative for congestion.   Respiratory: Negative for cough and shortness of breath.   Cardiovascular: Positive for palpitations. Negative for chest pain and leg swelling.  Endocrine: Positive for cold intolerance.  Musculoskeletal: Positive for arthralgias, gait problem and joint swelling.  Allergic/Immunologic: Positive for environmental allergies.  Neurological:       Chronic confusion.  All other systems reviewed and are negative.   All other systems reviewed and are negative.  The following is a summary of the past history medically, past history surgically, known current medicines, social history and family history.  This information is gathered electronically by the computer from prior information and documentation.  I review this each visit and have found including this information at this point in the chart is beneficial and informative.    Past Medical History:  Diagnosis Date  . Biatrial enlargement 12/01/2015  . Chronic anticoagulation    coumadin, CHADS2VASC=6  . History of mitral valve replacement   . Hypertension   . Hypothyroidism   . Osteoporosis    on Boniva  . Permanent atrial fibrillation   . Pulmonary hypertension, moderate to severe ([place]) 12/01/2015  . Stroke [facility])    on Clopidrigel  . UTI (lower urinary tract infection) 07/2015   n&v;  AKI  . Vascular dementia [facility])     Past Surgical History:  Procedure Laterality Date  . ABDOMINAL HYSTERECTOMY    . FRACTURE SURGERY  [id number]   Operative repair with intramedullary nail intratrochanteric Left Hip fracture  . INTRAMEDULLARY (IM) NAIL INTERTROCHANTERIC Left 12/01/2015   Procedure: INTRAMEDULLARY (IM) NAIL INTERTROCHANTRIC;  Surgeon: Carole Civil, MD;  Location: [facility] ORS;  Service: Orthopedics;  Laterality: Left;  . MITRAL VALVE REPLACEMENT      Family History  Problem Relation Age of Onset  . Heart disease Mother   . Heart disease Father   . Stroke Neg Hx   . Cancer Neg Hx   . Diabetes Neg Hx     Social History Social History   Tobacco Use  . Smoking status: Never Smoker  . Smokeless tobacco: Never Used  Substance Use Topics  . Alcohol use: No  . Drug use: No    Allergies  Allergen Reactions  . Penicillins Swelling    Has patient had a PCN reaction causing immediate rash, facial/tongue/throat swelling, SOB or lightheadedness with hypotension: Yes Has patient had a PCN reaction causing severe rash involving mucus membranes or skin necrosis: No Has patient had a PCN reaction that required hospitalization No Has patient had a PCN reaction occurring within the last 10 years: No If all of the above answers are "NO", then may proceed with Cephalosporin use. Whole body "swelled up" and had to go to the hospital  Current Outpatient Medications  Medication Sig Dispense Refill  . acetaminophen (TYLENOL) 500 MG tablet Take 500 mg by mouth every 6 (six) hours as needed for mild pain.    Marland Kitchen alendronate (FOSAMAX) 70 MG tablet     . amLODipine (NORVASC) 2.5 MG tablet Take 1 tablet (2.5 mg total) by mouth daily. 90 tablet 3  . atenolol (TENORMIN) 25 MG tablet Take 0.5 tablets (12.5 mg total) by mouth daily. 45 tablet 1  . b complex vitamins tablet Take 1 tablet by mouth daily.    . clopidogrel (PLAVIX) 75 MG tablet Take 1 tablet (75 mg total) by mouth daily. 90  tablet 1  . HYDROcodone-acetaminophen (NORCO/VICODIN) 5-325 MG tablet Take 1 tablet by mouth every 4 (four) hours as needed for moderate pain. 12 tablet 0  . ibandronate (BONIVA) 150 MG tablet Take 1 tablet (150 mg total) by mouth every 30 (thirty) days. Take in the morning with a full glass of water, on an empty stomach, and do not take anything else by mouth or lie down for the next 30 min. 3 tablet 0  . lisinopril (PRINIVIL,ZESTRIL) 2.5 MG tablet Take 1 tablet (2.5 mg total) by mouth daily. 90 tablet 0  . senna-docusate (SENOKOT-S) 8.6-50 MG tablet Take 1 tablet by mouth at bedtime. 30 tablet 0  . thyroid (ARMOUR) 90 MG tablet Take 90 mg by mouth daily.    . vitamin C (ASCORBIC ACID) 250 MG tablet Take 250 mg by mouth daily.     Marland Kitchen VITAMIN D, ERGOCALCIFEROL, PO Take 250 mg by mouth daily.    Marland Kitchen warfarin (COUMADIN) 2 MG tablet     . warfarin (COUMADIN) 3 MG tablet Take warfarin 3 mg Mon, Tues, Thu, Fri, Sat, Sun (every night except Wednesday) 64 tablet 0  . warfarin (COUMADIN) 4 MG tablet Take 4 mg weekly on Wednesday. 24 tablet 0  . triamterene-hydrochlorothiazide (DYAZIDE) 37.5-25 MG capsule Take 1 each (1 capsule total) by mouth every other day. (Patient not taking: Reported on 06/02/2018) 30 capsule 2  . warfarin (COUMADIN) 1 MG tablet Take 1.5 tablets (1.5 mg total) by mouth daily at 6 PM for 2 days. 3 tablet 0   No current facility-administered medications for this visit.      Physical Exam  Blood pressure (!) 163/76, pulse 69, height 5\' 3"  (1.6 m), weight 117 lb (53.1 kg).  Constitutional: overall normal hygiene, normal nutrition, well developed, normal grooming, normal body habitus. Assistive device:sling  Musculoskeletal: gait and station Limp none, muscle tone and strength are normal, no tremors or atrophy is present.  .  Neurological: coordination overall normal.  Deep tendon reflex/nerve stretch intact.  Sensation normal.  Cranial nerves II-XII intact. She is chronically  confused.  She is disoriented as to time and place.  She is very pleasant and smiles.  Skin:   Normal overall no scars, lesions, ulcers or rashes. No psoriasis.  She has large resolving ecchymosis of the face on the right side.  Psychiatric: Alert and oriented x 3.  Recent memory intact, remote memory unclear.  Normal mood and affect. Well groomed.  Good eye contact.  Cardiovascular: overall no swelling, no varicosities, no edema bilaterally, normal temperatures of the legs and arms, no clubbing, cyanosis and good capillary refill.  Lymphatic: palpation is normal.   Right shoulder with resolving ecchymosis and pain with any motion.  NV intact.  All other systems reviewed and are negative   The patient has been educated about the nature of the  problem(s) and counseled on treatment options.  The patient appeared to understand what I have discussed and is in agreement with it.  Encounter Diagnosis  Name Primary?  . Closed 3-part fracture of proximal humerus, right, initial encounter Yes   X-rays were done of the right shoulder, reported separately.  PLAN Call if any problems.  Precautions discussed.  Continue current medications.   Return to clinic 3 weeks   X-rays on return.  Electronically Signed Sanjuana Kava, MD 10/29/20194:13 PM

## 2018-06-15 ENCOUNTER — Encounter: Payer: Self-pay | Admitting: Student

## 2018-06-23 ENCOUNTER — Ambulatory Visit (INDEPENDENT_AMBULATORY_CARE_PROVIDER_SITE_OTHER): Payer: Medicare Other

## 2018-06-23 ENCOUNTER — Encounter: Payer: Self-pay | Admitting: Orthopaedic Surgery

## 2018-06-23 ENCOUNTER — Ambulatory Visit (INDEPENDENT_AMBULATORY_CARE_PROVIDER_SITE_OTHER): Payer: Medicare Other | Admitting: Orthopaedic Surgery

## 2018-06-23 DIAGNOSIS — S42291D Other displaced fracture of upper end of right humerus, subsequent encounter for fracture with routine healing: Secondary | ICD-10-CM | POA: Diagnosis not present

## 2018-06-23 NOTE — Progress Notes (Signed)
CC:  My shoulder does not hurt  She is now six weeks from her right shoulder injury.  NV intact.  X-rays were done of the right shoulder, reported separately.  Callus is present.  Encounter Diagnosis  Name Primary?  . Closed 3-part fracture of proximal end of right humerus with routine healing, subsequent encounter Yes   Begin OT.  Return in three weeks.  X-rays on return.  Call if any problem.  Precautions discussed.   Electronically Signed Sanjuana Kava, MD 11/19/20193:28 PM

## 2018-06-23 NOTE — Addendum Note (Signed)
Addended by: Elizabeth Sauer on: 06/23/2018 04:17 PM   Modules accepted: Orders

## 2018-06-24 ENCOUNTER — Ambulatory Visit (HOSPITAL_COMMUNITY): Payer: Medicare Other | Attending: Orthopaedic Surgery

## 2018-06-24 ENCOUNTER — Other Ambulatory Visit: Payer: Self-pay

## 2018-06-24 ENCOUNTER — Ambulatory Visit (HOSPITAL_COMMUNITY): Payer: Medicare Other | Admitting: Occupational Therapy

## 2018-06-24 ENCOUNTER — Encounter (HOSPITAL_COMMUNITY): Payer: Self-pay

## 2018-06-24 DIAGNOSIS — M25611 Stiffness of right shoulder, not elsewhere classified: Secondary | ICD-10-CM | POA: Diagnosis present

## 2018-06-24 DIAGNOSIS — M25511 Pain in right shoulder: Secondary | ICD-10-CM | POA: Insufficient documentation

## 2018-06-24 DIAGNOSIS — R29898 Other symptoms and signs involving the musculoskeletal system: Secondary | ICD-10-CM | POA: Diagnosis not present

## 2018-06-24 NOTE — Patient Instructions (Signed)
Complete the following exercises 2-3 times a day.   Wall Flexion  Slide your arm up the wall or door frame until a stretch is felt in your shoulder . Hold for 10-15 seconds. Complete 2 times     Shoulder Abduction Stretch  Stand side ways by a wall with affected up on wall. Gently step in toward wall to feel stretch. Hold for 10-15 seconds. Complete 2 times.     Perform each exercise ___10-15_____ reps. 2-3x days.   Protraction - Sitting  Start by holding a wand or cane at chest height.  Next, slowly push the wand outwards in front of your body so that your elbows become fully straightened. Then, return to the original position.     Shoulder FLEXION - STANDING - PALMS DOWN  In the sitting position, hold a wand/cane with both arms, palms down on both sides. Raise up the wand/cane allowing your unaffected arm to perform most of the effort. Your affected arm should be partially relaxed.      Internal/External ROTATION - Sitting  In the sitting position, hold a wand/cane with both hands keeping your elbows bent. Move your arms and wand/cane to one side.  Your affected arm should be partially relaxed while your unaffected arm performs most of the effort.       Shoulder ABDUCTION - Sitting  While holding a wand/cane palm face up on the injured side and palm face down on the uninjured side, slowly raise up your injured arm to the side.               Horizontal Abduction/Adduction      Straight arms holding cane at shoulder height, bring cane to right, center, left. Repeat starting to left.   Copyright  VHI. All rights reserved.

## 2018-06-25 NOTE — Therapy (Signed)
[place], [place], [address] Phone: [phone]   Fax:  [phone]  Occupational Therapy Evaluation  Patient Details  Name: Courtney [name] MRN: [id number] Date of Birth: 1929/07/24 Referring Provider (OT): Dr. Beverly Gust    Encounter Date: 06/24/2018  OT End of Session - 06/25/18 1457    Visit Number  1    Number of Visits  8    Date for OT Re-Evaluation  07/23/18    Authorization Type  [employer] medicare    Authorization Time Period  $20 co pay per visit    OT Start Time  4174   Pt arrived late   OT Stop Time  1430    OT Time Calculation (min)  26 min    Activity Tolerance  Patient tolerated treatment well    Behavior During Therapy  [facility] for tasks assessed/performed       Past Medical History:  Diagnosis Date  . Biatrial enlargement 12/01/2015  . Chronic anticoagulation    coumadin, CHADS2VASC=6  . History of mitral valve replacement   . Hypertension   . Hypothyroidism   . Osteoporosis    on Boniva  . Permanent atrial fibrillation   . Pulmonary hypertension, moderate to severe ([place]) 12/01/2015  . Stroke [facility])    on Clopidrigel  . UTI (lower urinary tract infection) 07/2015   n&v; AKI  . Vascular dementia [facility])     Past Surgical History:  Procedure Laterality Date  . ABDOMINAL HYSTERECTOMY    . FRACTURE SURGERY  [id number]   Operative repair with intramedullary nail intratrochanteric Left Hip fracture  . INTRAMEDULLARY (IM) NAIL INTERTROCHANTERIC Left 12/01/2015   Procedure: INTRAMEDULLARY (IM) NAIL INTERTROCHANTRIC;  Surgeon: Carole Civil, MD;  Location: [facility]S;  Service: Orthopedics;  Laterality: Left;  . MITRAL VALVE REPLACEMENT      There were no vitals filed for this visit.  Subjective Assessment - 06/24/18 1406    Subjective   S: Patient reports pain with end of stretch during passive stretching.     Patient is accompained by:  Family member   Son   Pertinent History  Patient is a 84  y/o female S/P right proximal humerus fracture which occured from a mechanical fall on 05/13/18. Pt was placed in a sling for two weeks only. Dr. Luna Glasgow has referred patient to occupational therapy for evaluation and treatment.     Special Tests  FOTO not appropriate due to cognition.     Patient Stated Goals  To return to using her right UE as normally as possible in order to return to using it at the gym.    Currently in Pain?  No/denies   Pain with resistive activities and increased movement       [facility] OT Assessment - 06/24/18 1407      Assessment   Medical Diagnosis  right proximal humerus fracture    Referring Provider (OT)  Dr. Beverly Gust     Onset Date/Surgical Date  05/13/18    Hand Dominance  Right    Prior Therapy  None for this condition      Precautions   Precautions  Other (comment)    Precaution Comments  History of Dementia       Restrictions   Weight Bearing Restrictions  No      Balance Screen   Has the patient fallen in the past 6 months  Yes    How many times?  1    Has  the patient had a decrease in activity level because of a fear of falling?   No    Is the patient reluctant to leave their home because of a fear of falling?   No      Home  Environment   Family/patient expects to be discharged to:  Private residence    Additional Comments  Patient receives assistance for all ADL tasks due to cognition. Son reports that someone comes into the home 3-4 times a week based on need to assist as well.     Lives With  Son      Prior Function   Level of Independence  Needs assistance with ADLs;Needs assistance with homemaking    Vocation  Retired    Comments  Son takes patient to the [facility] several times a week to work her lower body and left arm.       ADL   ADL comments  [facility] [employer] [facility] due [facility]. Pain in RUE with end stretch and unable to use it for any type of lifting tasks. Patient is able to use her straight  point cane in her right hand when ambulating.       Mobility   Mobility Status  History of falls      Written Expression   Dominant Hand  Right      Vision - History   Baseline Vision  Wears glasses all the time      Cognition   Overall Cognitive Status  History of cognitive impairments - at baseline      Observation/Other Assessments   Focus on Therapeutic Outcomes (FOTO)   No appropriate due to cogntive deficits.       ROM / Strength   AROM / PROM / Strength  AROM;PROM;Strength      Palpation   Palpation comment  Zero fascial restrictions noted in right upper arm, trapezius, and scapularis region.       AROM   Overall AROM Comments  Assessed supine. IR/er adducted.    AROM Assessment Site  Shoulder    Right/Left Shoulder  Right    Right Shoulder Flexion  85 Degrees    Right Shoulder ABduction  55 Degrees    Right Shoulder Internal Rotation  90 Degrees    Right Shoulder External Rotation  40 Degrees      PROM   Overall PROM Comments  Assessed IR/er adducted    PROM Assessment Site  Shoulder    Right/Left Shoulder  Right    Right Shoulder Flexion  130 Degrees    Right Shoulder ABduction  81 Degrees    Right Shoulder Internal Rotation  90 Degrees    Right Shoulder External Rotation  44 Degrees      Strength   Overall Strength Comments  Assessed seated. IR/er adducted    Strength Assessment Site  Shoulder    Right/Left Shoulder  Right    Right Shoulder Flexion  3-/5    Right Shoulder ABduction  3-/5    Right Shoulder Internal Rotation  4-/5    Right Shoulder External Rotation  3+/5                      OT Education - 06/25/18 1456    Education Details  AA/ROM shoulder exercises. Shoulder flexion stretch    Person(s) Educated  Patient    Methods  Explanation;Handout;Verbal cues;Tactile cues;Demonstration    Comprehension  Verbalized understanding;Returned demonstration;Need further instruction;Verbal  cues required       OT Short Term Goals -  06/25/18 1504      OT SHORT TERM GOAL #1   Title  Patient and family will be educated and independent with HEP in order to faciliate progress in therapy and return to using her RUE for daily tasks.     Time  4    Period  Weeks    Status  New    Target Date  07/23/18      OT SHORT TERM GOAL #2   Title  Patient will increase her A/ROM in her RUE to [facility] in order to return to typical gym routine with less difficulty.     Time  4    Period  Weeks    Status  New      OT SHORT TERM GOAL #3   Title  Patient will report a decreased pain amount in her RUE while returning to using the [employer] bike at the [facility] with increased comfort level.     Time  4    Period  Weeks    Status  New      OT SHORT TERM GOAL #4   Title  Patient will increase RUE strength to 4/5 in order to feel confident while completing UB strengthening exercises when attending the [facility].     Time  4    Period  Weeks    Status  New               Plan - 06/25/18 1501    Clinical Impression Statement  A: Patient is a 84 y/o female S/P right proximal humerus fracture causing increased pain with use, decreased strength and ROM resulting in difficulty using her RUE as her dominant and allowing her to retun to her normal leisure tasks.     Occupational Profile and client history currently impacting functional performance  Strong social support at home.     Occupational performance deficits (Please refer to evaluation for details):  ADL's;Leisure    Rehab Potential  Good    Current Impairments/barriers affecting progress:  history of dementia which will cause no carry over of education to patient. Will need to educate Son primarily.     OT Frequency  2x / week    OT Duration  4 weeks    OT Treatment/Interventions  Self-care/ADL training;Moist Heat;DME and/or AE instruction;Therapeutic activities;Ultrasound;Therapeutic exercise;Passive range of motion;Neuromuscular education;Cryotherapy;Electrical Stimulation;Manual  Therapy;Patient/family education    Plan  P: Patient will benefit from skilled OT services to increase functional performance in order to return to using her RUE for daily tasks. Treatment Plan: No myofascial release needed. P/ROM, AA/ROM, A/ROM, general strengthening. Modalities PRN.     Clinical Decision Making  Limited treatment options, no task modification necessary    Consulted and Agree with Plan of Care  Family member/caregiver    Family Member Consulted  Son       Patient will benefit from skilled therapeutic intervention in order to improve the following deficits and impairments:  Decreased range of motion, Pain, Impaired UE functional use, Decreased strength  Visit Diagnosis: Other symptoms and signs involving the musculoskeletal system  Acute pain of right shoulder  Stiffness of right shoulder, not elsewhere classified    Problem List Patient Active Problem List   Diagnosis Date Noted  . Frequent falls 05/14/2018  . Hypercalcemia 05/14/2018  . Closed fracture of right proximal humerus 05/14/2018  . Periorbital hematoma of right eye 05/14/2018  . Encounter for  therapeutic drug monitoring 03/11/2018  . T12 compression fracture ([place]) 04/22/2017  . Dementia, multi-infarct ([place]) 04/22/2017  . Cardioembolic stroke ([place]) 78/58/8502  . B12 deficiency 04/22/2017  . Cough 12/16/2015  . Osteoporosis 12/05/2015  . Biatrial enlargement 12/01/2015  . Pulmonary hypertension, moderate to severe ([place]) 12/01/2015  . Closed fracture of intertrochanteric section of femur ([place])   . Chronic atrial fibrillation 11/30/2015  . Hypothyroidism 11/29/2015  . Thrombocytopenia ([place]) 07/13/2015  . Chronic anticoagulation 07/13/2015  . S/P mitral valve replacement 07/13/2015  . Vascular dementia, due to brain bleed from elevated INR, not due to ischemic strokes.  07/12/2015  . Essential hypertension 07/12/2015   Ailene Ravel, OTR/L,CBIS  [phone]  06/25/2018, 3:08 PM  [facility] [address] [place], [place], [address] Phone: [phone]   Fax:  [phone]  Name: Courtney [name] MRN: [id number] Date of Birth: 1929/06/29

## 2018-06-26 ENCOUNTER — Ambulatory Visit (HOSPITAL_COMMUNITY): Payer: Medicare Other | Admitting: Occupational Therapy

## 2018-06-26 ENCOUNTER — Encounter (HOSPITAL_COMMUNITY): Payer: Self-pay | Admitting: Occupational Therapy

## 2018-06-26 DIAGNOSIS — M25611 Stiffness of right shoulder, not elsewhere classified: Secondary | ICD-10-CM

## 2018-06-26 DIAGNOSIS — M25511 Pain in right shoulder: Secondary | ICD-10-CM

## 2018-06-26 DIAGNOSIS — R29898 Other symptoms and signs involving the musculoskeletal system: Secondary | ICD-10-CM | POA: Diagnosis not present

## 2018-06-26 NOTE — Therapy (Signed)
[place], [place], [address] Phone: [phone]   Fax:  [phone]  Occupational Therapy Treatment  Patient Details  Name: Courtney [name] MRN: [id number] Date of Birth: [birth year] Referring Provider (OT): Dr. Beverly Gust    Encounter Date: 06/26/2018  OT End of Session - 06/26/18 1347    Visit Number  2    Number of Visits  8    Date for OT Re-Evaluation  07/23/18    Authorization Type  UHC medicare    Authorization Time Period  $20 co pay per visit    OT Start Time  1306    OT Stop Time  1345    OT Time Calculation (min)  39 min    Activity Tolerance  Patient tolerated treatment well    Behavior During Therapy  [facility] for tasks assessed/performed       Past Medical History:  Diagnosis Date  . Biatrial enlargement 12/01/2015  . Chronic anticoagulation    coumadin, CHADS2VASC=6  . History of mitral valve replacement   . Hypertension   . Hypothyroidism   . Osteoporosis    on Boniva  . Permanent atrial fibrillation   . Pulmonary hypertension, moderate to severe ([place]) 12/01/2015  . Stroke [facility])    on Clopidrigel  . UTI (lower urinary tract infection) 07/2015   n&v; AKI  . Vascular dementia [facility])     Past Surgical History:  Procedure Laterality Date  . ABDOMINAL HYSTERECTOMY    . FRACTURE SURGERY  [id number]   Operative repair with intramedullary nail intratrochanteric Left Hip fracture  . INTRAMEDULLARY (IM) NAIL INTERTROCHANTERIC Left 12/01/2015   Procedure: INTRAMEDULLARY (IM) NAIL INTERTROCHANTRIC;  Surgeon: Carole Civil, MD;  Location: [facility];  Service: Orthopedics;  Laterality: Left;  . MITRAL VALVE REPLACEMENT      There were no vitals filed for this visit.  Subjective Assessment - 06/26/18 1308    Subjective   S: I've put up with this for probably a month or so.     Currently in Pain?  No/denies         [facility] OT Assessment - 06/26/18 1308      Assessment   Medical Diagnosis  right  proximal humerus fracture      Precautions   Precautions  Other (comment)    Precaution Comments  History of Dementia                OT Treatments/Exercises (OP) - 06/26/18 1308      Exercises   Exercises  Shoulder      Shoulder Exercises: Supine   Protraction  PROM;5 reps;AAROM;10 reps    Horizontal ABduction  PROM;5 reps;AAROM;10 reps    External Rotation  PROM;5 reps;AAROM;10 reps    Internal Rotation  PROM;5 reps;AAROM;10 reps    Flexion  PROM;5 reps;AAROM;10 reps    ABduction  PROM;5 reps;AAROM;10 reps    ABduction Limitations  OT providing mod tactile cuing and guidance      Shoulder Exercises: Seated   Elevation  AROM;10 reps    Extension  AROM;10 reps    Row  AROM;10 reps    Protraction  AAROM;10 reps    Horizontal ABduction  AAROM;10 reps    External Rotation  AAROM;10 reps    Internal Rotation  AAROM;10 reps    Flexion  AAROM;10 reps      Shoulder Exercises: Therapy Ball   Flexion  10 reps  OT Short Term Goals - 06/26/18 1314      OT SHORT TERM GOAL #1   Title  Patient and family will be educated and independent with HEP in order to faciliate progress in therapy and return to using her RUE for daily tasks.     Time  4    Period  Weeks    Status  On-going      OT SHORT TERM GOAL #2   Title  Patient will increase her A/ROM in her RUE to [facility] in order to return to typical gym routine with less difficulty.     Time  4    Period  Weeks    Status  On-going      OT SHORT TERM GOAL #3   Title  Patient will report a decreased pain amount in her RUE while returning to using the [employer] bike at the [facility] with increased comfort level.     Time  4    Period  Weeks    Status  On-going      OT SHORT TERM GOAL #4   Title  Patient will increase RUE strength to 4/5 in order to feel confident while completing UB strengthening exercises when attending the [facility].     Time  4    Period  Weeks    Status  On-going      OT SHORT TERM GOAL #5    Status  On-going               Plan - 06/26/18 1328    Clinical Impression Statement  A: Initiated P/ROM, AA/ROM, and scapular A/ROM this session, pt requiring max verbal cuing for exercises and occasional min/mod tactile cuing due to cognition limiting carryover. Also initiated therapy ball exercises. Pt requiring occasional rest breaks due to fatigue. ROM is slightly greater than 50% with flexion/abduction during exercises today.     Plan  P: continue with AA/ROM and passive stretching working to improve ROM. Functional reaching task       Patient will benefit from skilled therapeutic intervention in order to improve the following deficits and impairments:  Decreased range of motion, Pain, Impaired UE functional use, Decreased strength  Visit Diagnosis: Other symptoms and signs involving the musculoskeletal system  Acute pain of right shoulder  Stiffness of right shoulder, not elsewhere classified    Problem List Patient Active Problem List   Diagnosis Date Noted  . Frequent falls 05/14/2018  . Hypercalcemia 05/14/2018  . Closed fracture of right proximal humerus 05/14/2018  . Periorbital hematoma of right eye 05/14/2018  . Encounter for therapeutic drug monitoring 03/11/2018  . T12 compression fracture ([place]) 04/22/2017  . Dementia, multi-infarct ([place]) 04/22/2017  . Cardioembolic stroke ([place]) 18/29/9371  . B12 deficiency 04/22/2017  . Cough 12/16/2015  . Osteoporosis 12/05/2015  . Biatrial enlargement 12/01/2015  . Pulmonary hypertension, moderate to severe ([place]) 12/01/2015  . Closed fracture of intertrochanteric section of femur ([place])   . Chronic atrial fibrillation 11/30/2015  . Hypothyroidism 11/29/2015  . Thrombocytopenia ([place]) 07/13/2015  . Chronic anticoagulation 07/13/2015  . S/P mitral valve replacement 07/13/2015  . Vascular dementia, due to brain bleed from elevated INR, not due to ischemic strokes.  07/12/2015  . Essential hypertension 07/12/2015    Guadelupe Sabin, OTR/L  [phone] 06/26/2018, 1:48 PM  [place] [address] [place], [place], [address] Phone: [phone]   Fax:  [phone]  Name: Courtney [name] MRN: [id number] Date of Birth: 07-31-1929

## 2018-06-29 ENCOUNTER — Ambulatory Visit (HOSPITAL_COMMUNITY): Payer: Medicare Other | Admitting: Occupational Therapy

## 2018-06-29 ENCOUNTER — Encounter (HOSPITAL_COMMUNITY): Payer: Self-pay | Admitting: Occupational Therapy

## 2018-06-29 DIAGNOSIS — R29898 Other symptoms and signs involving the musculoskeletal system: Secondary | ICD-10-CM

## 2018-06-29 DIAGNOSIS — M25511 Pain in right shoulder: Secondary | ICD-10-CM

## 2018-06-29 DIAGNOSIS — M25611 Stiffness of right shoulder, not elsewhere classified: Secondary | ICD-10-CM

## 2018-06-29 NOTE — Therapy (Signed)
[place], [place], [address] Phone: [phone]   Fax:  [phone]  Occupational Therapy Treatment  Patient Details  Name: Courtney [name] MRN: [id number] Date of Birth: 1929/05/20 Referring Provider (OT): Dr. Beverly Gust    Encounter Date: 06/29/2018  OT End of Session - 06/29/18 1452    Visit Number  3    Number of Visits  8    Date for OT Re-Evaluation  07/23/18    Authorization Type  [employer]HC medicare    Authorization Time Period  $20 co pay per visit    OT Start Time  1017   pt arrived late   OT Stop Time  1430    OT Time Calculation (min)  35 min    Activity Tolerance  Patient tolerated treatment well    Behavior During Therapy  [facility] for tasks assessed/performed       Past Medical History:  Diagnosis Date  . Biatrial enlargement 12/01/2015  . Chronic anticoagulation    coumadin, CHADS2VASC=6  . History of mitral valve replacement   . Hypertension   . Hypothyroidism   . Osteoporosis    on Boniva  . Permanent atrial fibrillation   . Pulmonary hypertension, moderate to severe ([place]) 12/01/2015  . Stroke [facility])    on Clopidrigel  . UTI (lower urinary tract infection) 07/2015   n&v; AKI  . Vascular dementia [facility])     Past Surgical History:  Procedure Laterality Date  . ABDOMINAL HYSTERECTOMY    . FRACTURE SURGERY  [id number]   Operative repair with intramedullary nail intratrochanteric Left Hip fracture  . INTRAMEDULLARY (IM) NAIL INTERTROCHANTERIC Left 12/01/2015   Procedure: INTRAMEDULLARY (IM) NAIL INTERTROCHANTRIC;  Surgeon: Carole Civil, MD;  Location: [facility];  Service: Orthopedics;  Laterality: Left;  . MITRAL VALVE REPLACEMENT      There were no vitals filed for this visit.  Subjective Assessment - 06/29/18 1358    Subjective   S: It hasn't hurt me much.     Currently in Pain?  No/denies         [facility] OT Assessment - 06/29/18 1358      Assessment   Medical Diagnosis  right proximal  humerus fracture      Precautions   Precautions  Other (comment)    Precaution Comments  History of Dementia                OT Treatments/Exercises (OP) - 06/29/18 1359      Exercises   Exercises  Shoulder      Shoulder Exercises: Supine   Protraction  PROM;5 reps;AAROM;10 reps    Horizontal ABduction  PROM;5 reps;AAROM;10 reps    External Rotation  PROM;5 reps;AAROM;10 reps    Internal Rotation  PROM;5 reps;AAROM;10 reps    Flexion  PROM;5 reps;AAROM;10 reps    ABduction  PROM;5 reps;AAROM;10 reps    ABduction Limitations  OT providing mod tactile cuing and guidance      Shoulder Exercises: Seated   Protraction  AAROM;10 reps    Horizontal ABduction  AAROM;10 reps    External Rotation  AAROM;10 reps    Internal Rotation  AAROM;10 reps    Flexion  AAROM;10 reps      Shoulder Exercises: Therapy Ball   Flexion  15 reps      Functional Reaching Activities   Mid Level  pt completed pinch tree task working on functional reaching. Pt able to place all yellow and 5 red pinch  approximately 10 inches up vertical pole of tree with mod difficulty and cuing for sustaining maximum reach.                OT Short Term Goals - 06/26/18 1314      OT SHORT TERM GOAL #1   Title  Patient and family will be educated and independent with HEP in order to faciliate progress in therapy and return to using her RUE for daily tasks.     Time  4    Period  Weeks    Status  On-going      OT SHORT TERM GOAL #2   Title  Patient will increase her A/ROM in her RUE to [facility] in order to return to typical gym routine with less difficulty.     Time  4    Period  Weeks    Status  On-going      OT SHORT TERM GOAL #3   Title  Patient will report a decreased pain amount in her RUE while returning to using the Biode[employer] bike at the [facility] with increased comfort level.     Time  4    Period  Weeks    Status  On-going      OT SHORT TERM GOAL #4   Title  Patient will increase RUE strength to 4/5  in order to feel confident while completing UB strengthening exercises when attending the [facility].     Time  4    Period  Weeks    Status  On-going      OT SHORT TERM GOAL #5   Status  On-going               Plan - 06/29/18 1402    Clinical Impression Statement  A: Continued with passive stretching, AA/ROM, scapular A/ROM this session, added functional reaching working on improving ROM. Pt requiring frequent verbal and tactile cuing for form and technique as well as for breathing due to cognition. ROM at approximately 50% today during AA/ROM exercises.     Plan  P: Continue working on improving ROM during passive and active exercises for improved ROM. Add therapy ball circles       Patient will benefit from skilled therapeutic intervention in order to improve the following deficits and impairments:  Decreased range of motion, Pain, Impaired UE functional use, Decreased strength  Visit Diagnosis: Other symptoms and signs involving the musculoskeletal system  Acute pain of right shoulder  Stiffness of right shoulder, not elsewhere classified    Problem List Patient Active Problem List   Diagnosis Date Noted  . Frequent falls 05/14/2018  . Hypercalcemia 05/14/2018  . Closed fracture of right proximal humerus 05/14/2018  . Periorbital hematoma of right eye 05/14/2018  . Encounter for therapeutic drug monitoring 03/11/2018  . T12 compression fracture ([place]) 04/22/2017  . Dementia, multi-infarct ([place]) 04/22/2017  . Cardioembolic stroke ([place]) 78/29/5621  . B12 deficiency 04/22/2017  . Cough 12/16/2015  . Osteoporosis 12/05/2015  . Biatrial enlargement 12/01/2015  . Pulmonary hypertension, moderate to severe ([place]) 12/01/2015  . Closed fracture of intertrochanteric section of femur ([place])   . Chronic atrial fibrillation 11/30/2015  . Hypothyroidism 11/29/2015  . Thrombocytopenia ([place]) 07/13/2015  . Chronic anticoagulation 07/13/2015  . S/P mitral valve replacement 07/13/2015   . Vascular dementia, due to brain bleed from elevated INR, not due to ischemic strokes.  07/12/2015  . Essential hypertension 07/12/2015   Guadelupe Sabin, OTR/L  [phone] 06/29/2018, 2:54 PM  [place]  [facility] [address] [place], [place], [address] Phone: [phone]   Fax:  [phone]  Name: Courtney [name] MRN: [id number] Date of Birth: 06-Jun-1929

## 2018-07-01 ENCOUNTER — Encounter (HOSPITAL_COMMUNITY): Payer: Self-pay

## 2018-07-01 ENCOUNTER — Ambulatory Visit (HOSPITAL_COMMUNITY): Payer: Medicare Other

## 2018-07-01 DIAGNOSIS — M25611 Stiffness of right shoulder, not elsewhere classified: Secondary | ICD-10-CM

## 2018-07-01 DIAGNOSIS — R29898 Other symptoms and signs involving the musculoskeletal system: Secondary | ICD-10-CM

## 2018-07-01 DIAGNOSIS — M25511 Pain in right shoulder: Secondary | ICD-10-CM

## 2018-07-01 NOTE — Therapy (Signed)
[place], [place], [address] Phone: [phone]   Fax:  [phone]  Occupational Therapy Treatment  Patient Details  Name: Courtney [name] MRN: [id number] Date of Birth: 03-27-1929 Referring Provider (OT): Dr. Beverly Gust    Encounter Date: 07/01/2018  OT End of Session - 07/01/18 1634    Visit Number  4    Number of Visits  8    Date for OT Re-Evaluation  07/23/18    Authorization Type  [employer]HC medicare    Authorization Time Period  $20 co pay per visit    OT Start Time  2992    OT Stop Time  1345    OT Time Calculation (min)  40 min    Activity Tolerance  Patient tolerated treatment well    Behavior During Therapy  [facility] for tasks assessed/performed       Past Medical History:  Diagnosis Date  . Biatrial enlargement 12/01/2015  . Chronic anticoagulation    coumadin, CHADS2VASC=6  . History of mitral valve replacement   . Hypertension   . Hypothyroidism   . Osteoporosis    on Boniva  . Permanent atrial fibrillation   . Pulmonary hypertension, moderate to severe ([place]) 12/01/2015  . Stroke [facility])    on Clopidrigel  . UTI (lower urinary tract infection) 07/2015   n&v; AKI  . Vascular dementia [facility])     Past Surgical History:  Procedure Laterality Date  . ABDOMINAL HYSTERECTOMY    . FRACTURE SURGERY  [id number]   Operative repair with intramedullary nail intratrochanteric Left Hip fracture  . INTRAMEDULLARY (IM) NAIL INTERTROCHANTERIC Left 12/01/2015   Procedure: INTRAMEDULLARY (IM) NAIL INTERTROCHANTRIC;  Surgeon: Carole Civil, MD;  Location: [facility];  Service: Orthopedics;  Laterality: Left;  . MITRAL VALVE REPLACEMENT      There were no vitals filed for this visit.  Subjective Assessment - 07/01/18 1318    Subjective   S: Son reports that patient has not appeared to be in any pain when at home completing activities.     Currently in Pain?  No/denies         [facility] OT Assessment - 07/01/18 1319       Assessment   Medical Diagnosis  right proximal humerus fracture      Precautions   Precautions  Other (comment)    Precaution Comments  History of Dementia                OT Treatments/Exercises (OP) - 07/01/18 1319      Exercises   Exercises  Shoulder      Shoulder Exercises: Supine   Protraction  PROM;5 reps;AAROM;10 reps    Horizontal ABduction  PROM;5 reps;AAROM;10 reps    External Rotation  PROM;5 reps;AAROM;10 reps    Internal Rotation  PROM;5 reps;AAROM;10 reps    Flexion  PROM;5 reps;AAROM;10 reps    ABduction  PROM;5 reps;AAROM;10 reps      Shoulder Exercises: Seated   Protraction  AAROM;10 reps    Horizontal ABduction  AAROM;10 reps    External Rotation  AAROM;10 reps    Internal Rotation  AAROM;10 reps    Flexion  AAROM;10 reps      Shoulder Exercises: Therapy Ball   Other Therapy Ball Exercises  Attempted ball circles although patient unable to perform. Activity modified: Therapy ball placed on right rear side of patient. Pt reached back for ball with right UE and rolled all the way to the left side  to therapist. Completed 3 times.                OT Short Term Goals - 06/26/18 1314      OT SHORT TERM GOAL #1   Title  Patient and family will be educated and independent with HEP in order to faciliate progress in therapy and return to using her RUE for daily tasks.     Time  4    Period  Weeks    Status  On-going      OT SHORT TERM GOAL #2   Title  Patient will increase her A/ROM in her RUE to [facility] in order to return to typical gym routine with less difficulty.     Time  4    Period  Weeks    Status  On-going      OT SHORT TERM GOAL #3   Title  Patient will report a decreased pain amount in her RUE while returning to using the Biodex bike at the [facility] with increased comfort level.     Time  4    Period  Weeks    Status  On-going      OT SHORT TERM GOAL #4   Title  Patient will increase RUE strength to 4/5 in order to feel confident  while completing UB strengthening exercises when attending the [facility].     Time  4    Period  Weeks    Status  On-going      OT SHORT TERM GOAL #5   Status  On-going               Plan - 07/01/18 1634    Clinical Impression Statement  A: Pt able to tolerate slightly more passive stretching compared to initial evaluation. patient continues to require direct supervision and verbal and physical cueing throughout session due to decreased cognition. Limited by pain during passive stretching. Son updated on patient's performance at end of session.     Plan  P: Completed functional reaching tasks with cones. Add pulleys    Consulted and Agree with Plan of Care  Patient;Family member/caregiver    Family Member Consulted  Son       Patient will benefit from skilled therapeutic intervention in order to improve the following deficits and impairments:  Decreased range of motion, Pain, Impaired UE functional use, Decreased strength  Visit Diagnosis: Other symptoms and signs involving the musculoskeletal system  Acute pain of right shoulder  Stiffness of right shoulder, not elsewhere classified    Problem List Patient Active Problem List   Diagnosis Date Noted  . Frequent falls 05/14/2018  . Hypercalcemia 05/14/2018  . Closed fracture of right proximal humerus 05/14/2018  . Periorbital hematoma of right eye 05/14/2018  . Encounter for therapeutic drug monitoring 03/11/2018  . T12 compression fracture ([place]) 04/22/2017  . Dementia, multi-infarct ([place]) 04/22/2017  . Cardioembolic stroke ([place]) 58/52/7782  . B12 deficiency 04/22/2017  . Cough 12/16/2015  . Osteoporosis 12/05/2015  . Biatrial enlargement 12/01/2015  . Pulmonary hypertension, moderate to severe ([place]) 12/01/2015  . Closed fracture of intertrochanteric section of femur ([place])   . Chronic atrial fibrillation 11/30/2015  . Hypothyroidism 11/29/2015  . Thrombocytopenia ([place]) 07/13/2015  . Chronic anticoagulation  07/13/2015  . S/P mitral valve replacement 07/13/2015  . Vascular dementia, due to brain bleed from elevated INR, not due to ischemic strokes.  07/12/2015  . Essential hypertension 07/12/2015   Ailene Ravel, OTR/L,CBIS  [phone]  07/01/2018, 4:36 PM  [place], [place], [address] Phone: [phone]   Fax:  [phone]  Name: Aishi Courts MRN: [id number] Date of Birth: 30-Jun-1929

## 2018-07-07 ENCOUNTER — Ambulatory Visit (HOSPITAL_COMMUNITY): Payer: Medicare Other | Attending: Orthopaedic Surgery

## 2018-07-07 ENCOUNTER — Encounter (HOSPITAL_COMMUNITY): Payer: Self-pay

## 2018-07-07 DIAGNOSIS — M25611 Stiffness of right shoulder, not elsewhere classified: Secondary | ICD-10-CM

## 2018-07-07 DIAGNOSIS — M25511 Pain in right shoulder: Secondary | ICD-10-CM | POA: Diagnosis present

## 2018-07-07 DIAGNOSIS — R29898 Other symptoms and signs involving the musculoskeletal system: Secondary | ICD-10-CM

## 2018-07-07 NOTE — Therapy (Signed)
[place], [place], [address] Phone: [phone]   Fax:  [phone]  Occupational Therapy Treatment  Patient Details  Name: Courtney [name] MRN: [id number] Date of Birth: 18-Jun-1929 Referring Provider (OT): Dr. Beverly Gust    Encounter Date: 07/07/2018  OT End of Session - 07/07/18 1541    Visit Number  5    Number of Visits  8    Date for OT Re-Evaluation  07/23/18    Authorization Type  UHC medicare    Authorization Time Period  $20 co pay per visit    OT Start Time  1442   Pt arrived late   OT Stop Time  1518    OT Time Calculation (min)  36 min    Activity Tolerance  Patient tolerated treatment well    Behavior During Therapy  [facility] for tasks assessed/performed       Past Medical History:  Diagnosis Date  . Biatrial enlargement 12/01/2015  . Chronic anticoagulation    coumadin, CHADS2VASC=6  . History of mitral valve replacement   . Hypertension   . Hypothyroidism   . Osteoporosis    on Boniva  . Permanent atrial fibrillation   . Pulmonary hypertension, moderate to severe ([place]) 12/01/2015  . Stroke [facility])    on Clopidrigel  . UTI (lower urinary tract infection) 07/2015   n&v; AKI  . Vascular dementia [facility])     Past Surgical History:  Procedure Laterality Date  . ABDOMINAL HYSTERECTOMY    . FRACTURE SURGERY  [id number]   Operative repair with intramedullary nail intratrochanteric Left Hip fracture  . INTRAMEDULLARY (IM) NAIL INTERTROCHANTERIC Left 12/01/2015   Procedure: INTRAMEDULLARY (IM) NAIL INTERTROCHANTRIC;  Surgeon: Carole Civil, MD;  Location: [facility];  Service: Orthopedics;  Laterality: Left;  . MITRAL VALVE REPLACEMENT      There were no vitals filed for this visit.  Subjective Assessment - 07/07/18 1453    Subjective   S: Son reports that she still is not able to complete the Biodex bike due to the pain.    Currently in Pain?  No/denies         [facility] OT Assessment - 07/07/18  1454      Assessment   Medical Diagnosis  right proximal humerus fracture      Precautions   Precautions  Other (comment)    Precaution Comments  History of Dementia                OT Treatments/Exercises (OP) - 07/07/18 1454      Exercises   Exercises  Shoulder      Shoulder Exercises: Supine   Protraction  PROM;5 reps;AAROM;10 reps    Horizontal ABduction  PROM;5 reps;AAROM;10 reps    External Rotation  PROM;5 reps;AAROM;10 reps    Internal Rotation  PROM;5 reps;AAROM;10 reps    Flexion  PROM;5 reps;AAROM;10 reps    ABduction  PROM;5 reps;AAROM;10 reps      Shoulder Exercises: Seated   Protraction  AAROM;10 reps    Horizontal ABduction  AAROM;10 reps    External Rotation  --    Internal Rotation  --    Flexion  AAROM;10 reps    Abduction  AAROM;10 reps      Shoulder Exercises: Pulleys   Flexion  1 minute   standing     Functional Reaching Activities   Mid Level  pt completed functional reaching activity while placing 10 cones on the first shelf with min-mod  difficulty. Pt then removed them one at a time with min difficulty.                OT Short Term Goals - 06/26/18 1314      OT SHORT TERM GOAL #1   Title  Patient and family will be educated and independent with HEP in order to faciliate progress in therapy and return to using her RUE for daily tasks.     Time  4    Period  Weeks    Status  On-going      OT SHORT TERM GOAL #2   Title  Patient will increase her A/ROM in her RUE to [facility] in order to return to typical gym routine with less difficulty.     Time  4    Period  Weeks    Status  On-going      OT SHORT TERM GOAL #3   Title  Patient will report a decreased pain amount in her RUE while returning to using the Biodex bike at the [facility] with increased comfort level.     Time  4    Period  Weeks    Status  On-going      OT SHORT TERM GOAL #4   Title  Patient will increase RUE strength to 4/5 in order to feel confident while completing  UB strengthening exercises when attending the [facility].     Time  4    Period  Weeks    Status  On-going      OT SHORT TERM GOAL #5   Status  On-going               Plan - 07/07/18 1541    Clinical Impression Statement  A: Added functional reaching task as shoulder level while standing as well as shoulder flexion with pulleys. patient continues to have pain at end stretch during passive ROM although has noticeably increase in ROM achieved this session. Verbal and pysical cueing needed for form and technique due to cognition.     Plan  P: Attempt pulleys in scaption/abduction standing. Add wall wash.     Consulted and Agree with Plan of Care  Patient;Family member/caregiver    Family Member Consulted  Son       Patient will benefit from skilled therapeutic intervention in order to improve the following deficits and impairments:  Decreased range of motion, Pain, Impaired UE functional use, Decreased strength  Visit Diagnosis: Other symptoms and signs involving the musculoskeletal system  Acute pain of right shoulder  Stiffness of right shoulder, not elsewhere classified    Problem List Patient Active Problem List   Diagnosis Date Noted  . Frequent falls 05/14/2018  . Hypercalcemia 05/14/2018  . Closed fracture of right proximal humerus 05/14/2018  . Periorbital hematoma of right eye 05/14/2018  . Encounter for therapeutic drug monitoring 03/11/2018  . T12 compression fracture ([place]) 04/22/2017  . Dementia, multi-infarct ([place]) 04/22/2017  . Cardioembolic stroke ([place]) 44/31/5400  . B12 deficiency 04/22/2017  . Cough 12/16/2015  . Osteoporosis 12/05/2015  . Biatrial enlargement 12/01/2015  . Pulmonary hypertension, moderate to severe ([place]) 12/01/2015  . Closed fracture of intertrochanteric section of femur ([place])   . Chronic atrial fibrillation 11/30/2015  . Hypothyroidism 11/29/2015  . Thrombocytopenia ([place]) 07/13/2015  . Chronic anticoagulation 07/13/2015  . S/P  mitral valve replacement 07/13/2015  . Vascular dementia, due to brain bleed from elevated INR, not due to ischemic strokes.  07/12/2015  . Essential hypertension 07/12/2015  Ailene Ravel, OTR/L,CBIS  [phone]  07/07/2018, 3:43 PM  [place] [address] [place], [place], [address] Phone: [phone]   Fax:  [phone]  Name: Courtney [name] MRN: [id number] Date of Birth: 1929-05-30

## 2018-07-09 ENCOUNTER — Encounter (HOSPITAL_COMMUNITY): Payer: Self-pay

## 2018-07-09 ENCOUNTER — Ambulatory Visit (HOSPITAL_COMMUNITY): Payer: Medicare Other

## 2018-07-09 DIAGNOSIS — M25511 Pain in right shoulder: Secondary | ICD-10-CM

## 2018-07-09 DIAGNOSIS — R29898 Other symptoms and signs involving the musculoskeletal system: Secondary | ICD-10-CM | POA: Diagnosis not present

## 2018-07-09 DIAGNOSIS — M25611 Stiffness of right shoulder, not elsewhere classified: Secondary | ICD-10-CM

## 2018-07-09 NOTE — Therapy (Signed)
[place], [place], [address] Phone: [phone]   Fax:  [phone]  Occupational Therapy Treatment  Patient Details  Name: Courtney [name] MRN: [id number] Date of Birth: Jul 08, 1929 Referring Provider (OT): Dr. Beverly Gust    Encounter Date: 07/09/2018  OT End of Session - 07/09/18 1623    Visit Number  6    Number of Visits  8    Date for OT Re-Evaluation  07/23/18    Authorization Type  [employer]HC medicare    Authorization Time Period  $20 co pay per visit    OT Start Time  1430    OT Stop Time  1518    OT Time Calculation (min)  48 min    Activity Tolerance  Patient tolerated treatment well    Behavior During Therapy  [facility] for tasks assessed/performed       Past Medical History:  Diagnosis Date  . Biatrial enlargement 12/01/2015  . Chronic anticoagulation    coumadin, CHADS2VASC=6  . History of mitral valve replacement   . Hypertension   . Hypothyroidism   . Osteoporosis    on Boniva  . Permanent atrial fibrillation   . Pulmonary hypertension, moderate to severe ([place]) 12/01/2015  . Stroke [facility])    on Clopidrigel  . UTI (lower urinary tract infection) 07/2015   n&v; AKI  . Vascular dementia [facility])     Past Surgical History:  Procedure Laterality Date  . ABDOMINAL HYSTERECTOMY    . FRACTURE SURGERY  [id number]   Operative repair with intramedullary nail intratrochanteric Left Hip fracture  . INTRAMEDULLARY (IM) NAIL INTERTROCHANTERIC Left 12/01/2015   Procedure: INTRAMEDULLARY (IM) NAIL INTERTROCHANTRIC;  Surgeon: Carole Civil, MD;  Location: [facility];  Service: Orthopedics;  Laterality: Left;  . MITRAL VALVE REPLACEMENT      There were no vitals filed for this visit.  Subjective Assessment - 07/09/18 1450    Subjective   S: Patient had tylenol prior to session although no gum to chew.    Currently in Pain?  No/denies         [facility] OT Assessment - 07/09/18 1450      Assessment   Medical  Diagnosis  right proximal humerus fracture      Precautions   Precautions  Other (comment)    Precaution Comments  History of Dementia       ROM / Strength   AROM / PROM / Strength  AROM      AROM   Overall AROM Comments  Assessed standing. IR/er adducted    AROM Assessment Site  Shoulder    Right/Left Shoulder  Right    Right Shoulder Flexion  85 Degrees   previous: 85 (supine)   Right Shoulder ABduction  65 Degrees   previous: 55 (supine)   Right Shoulder Internal Rotation  90 Degrees   previous: 90 (supine)   Right Shoulder External Rotation  40 Degrees   previous: 40 (supine)              OT Treatments/Exercises (OP) - 07/09/18 1450      Exercises   Exercises  Shoulder      Shoulder Exercises: Supine   Protraction  PROM;5 reps;AAROM;10 reps    Horizontal ABduction  PROM;5 reps;AAROM;10 reps    External Rotation  PROM;5 reps;AAROM;10 reps    Internal Rotation  PROM;5 reps;AAROM;10 reps    Flexion  PROM;5 reps;AAROM;10 reps    ABduction  PROM;5 reps;AAROM;10 reps  Shoulder Exercises: Seated   Protraction  AAROM;10 reps    Horizontal ABduction  AAROM;10 reps    External Rotation  AAROM;10 reps    Internal Rotation  AAROM;10 reps    Flexion  AAROM;10 reps    Abduction  AAROM;10 reps      Shoulder Exercises: Pulleys   Flexion  1 minute   standing     Shoulder Exercises: ROM/Strengthening   Wall Wash  1'      Functional Reaching Activities   Mid Level  Pt placed and removed 10 cones on 1st shelf level with min difficulty                OT Short Term Goals - 06/26/18 1314      OT SHORT TERM GOAL #1   Title  Patient and family will be educated and independent with HEP in order to faciliate progress in therapy and return to using her RUE for daily tasks.     Time  4    Period  Weeks    Status  On-going      OT SHORT TERM GOAL #2   Title  Patient will increase her A/ROM in her RUE to [facility] in order to return to typical gym routine with  less difficulty.     Time  4    Period  Weeks    Status  On-going      OT SHORT TERM GOAL #3   Title  Patient will report a decreased pain amount in her RUE while returning to using the Biodex bike at the [facility] with increased comfort level.     Time  4    Period  Weeks    Status  On-going      OT SHORT TERM GOAL #4   Title  Patient will increase RUE strength to 4/5 in order to feel confident while completing UB strengthening exercises when attending the [facility].     Time  4    Period  Weeks    Status  On-going      OT SHORT TERM GOAL #5   Status  On-going               Plan - 07/09/18 1623    Clinical Impression Statement  A: A/ROM measurements were taken at end of session as patient will be seeing the MD on Tuesday next week prior to therapy appointment. Active ROM abduction has increased since evaluation. All other shoulder ranges have remained the same. Official P/ROM measurements were not taken although based on patient's performance in the past few sessions it appears that her P/ROM has increased since evaluation. Pt appeared to have a lower tolerance for stretching and was reluctant to push past her comfort level. Due to time constraint was unable to add abduction/scaption with pulleys. Pt was provided with verbal and physical cueing due to cognition.    Plan  P: Follow on MD appointment. Complete pulleys in scaption/abduction.     Consulted and Agree with Plan of Care  Patient;Family member/caregiver    Family Member Consulted  Son       Patient will benefit from skilled therapeutic intervention in order to improve the following deficits and impairments:  Decreased range of motion, Pain, Impaired UE functional use, Decreased strength  Visit Diagnosis: Other symptoms and signs involving the musculoskeletal system  Acute pain of right shoulder  Stiffness of right shoulder, not elsewhere classified    Problem List Patient Active Problem List  Diagnosis Date Noted   . Frequent falls 05/14/2018  . Hypercalcemia 05/14/2018  . Closed fracture of right proximal humerus 05/14/2018  . Periorbital hematoma of right eye 05/14/2018  . Encounter for therapeutic drug monitoring 03/11/2018  . T12 compression fracture ([place]) 04/22/2017  . Dementia, multi-infarct ([place]) 04/22/2017  . Cardioembolic stroke ([place]) 48/08/6551  . B12 deficiency 04/22/2017  . Cough 12/16/2015  . Osteoporosis 12/05/2015  . Biatrial enlargement 12/01/2015  . Pulmonary hypertension, moderate to severe ([place]) 12/01/2015  . Closed fracture of intertrochanteric section of femur ([place])   . Chronic atrial fibrillation 11/30/2015  . Hypothyroidism 11/29/2015  . Thrombocytopenia ([place]) 07/13/2015  . Chronic anticoagulation 07/13/2015  . S/P mitral valve replacement 07/13/2015  . Vascular dementia, due to brain bleed from elevated INR, not due to ischemic strokes.  07/12/2015  . Essential hypertension 07/12/2015   Ailene Ravel, OTR/L,CBIS  [phone]  07/09/2018, 4:30 PM  [place] [address] [place], [place], [address] Phone: [phone]   Fax:  [phone]  Name: Courtney [name] MRN: [id number] Date of Birth: 08-31-1928

## 2018-07-14 ENCOUNTER — Ambulatory Visit (INDEPENDENT_AMBULATORY_CARE_PROVIDER_SITE_OTHER): Payer: Medicare Other | Admitting: Orthopaedic Surgery

## 2018-07-14 ENCOUNTER — Ambulatory Visit (HOSPITAL_COMMUNITY): Payer: Medicare Other

## 2018-07-14 ENCOUNTER — Ambulatory Visit (INDEPENDENT_AMBULATORY_CARE_PROVIDER_SITE_OTHER): Payer: Medicare Other

## 2018-07-14 ENCOUNTER — Encounter (HOSPITAL_COMMUNITY): Payer: Self-pay

## 2018-07-14 ENCOUNTER — Encounter: Payer: Self-pay | Admitting: Orthopaedic Surgery

## 2018-07-14 VITALS — BP 162/70 | HR 69 | Ht 63.0 in | Wt 120.0 lb

## 2018-07-14 DIAGNOSIS — R29898 Other symptoms and signs involving the musculoskeletal system: Secondary | ICD-10-CM | POA: Diagnosis not present

## 2018-07-14 DIAGNOSIS — S42291D Other displaced fracture of upper end of right humerus, subsequent encounter for fracture with routine healing: Secondary | ICD-10-CM

## 2018-07-14 DIAGNOSIS — M25611 Stiffness of right shoulder, not elsewhere classified: Secondary | ICD-10-CM

## 2018-07-14 DIAGNOSIS — M25511 Pain in right shoulder: Secondary | ICD-10-CM

## 2018-07-14 NOTE — Progress Notes (Signed)
CC:  I do not hurt  She has no pain of the right shoulder  She has good functional motion of the shoulder.  NV intact.  X-rays were done of the right shoulder, reported separately.  Encounter Diagnosis  Name Primary?  . Closed 3-part fracture of proximal end of right humerus with routine healing, subsequent encounter Yes   I will see as needed.  Electronically Signed Sanjuana Kava, MD 12/10/20192:59 PM

## 2018-07-15 NOTE — Therapy (Signed)
[place], [place], [address] Phone: [phone]   Fax:  [phone]  Occupational Therapy Treatment  Patient Details  Name: Courtney [name] MRN: [id number] Date of Birth: 25-Sep-1928 Referring Provider (OT): Dr. Beverly Gust    Encounter Date: 07/14/2018  OT End of Session - 07/15/18 0809    Visit Number  7    Number of Visits  8    Date for OT Re-Evaluation  07/23/18    Authorization Type  UHC medicare    Authorization Time Period  $20 co pay per visit    OT Start Time  1610    OT Stop Time  1650    OT Time Calculation (min)  40 min    Activity Tolerance  Patient tolerated treatment well    Behavior During Therapy  [facility] for tasks assessed/performed       Past Medical History:  Diagnosis Date  . Biatrial enlargement 12/01/2015  . Chronic anticoagulation    coumadin, CHADS2VASC=6  . History of mitral valve replacement   . Hypertension   . Hypothyroidism   . Osteoporosis    on Boniva  . Permanent atrial fibrillation   . Pulmonary hypertension, moderate to severe ([place]) 12/01/2015  . Stroke [facility])    on Clopidrigel  . UTI (lower urinary tract infection) 07/2015   n&v; AKI  . Vascular dementia [facility])     Past Surgical History:  Procedure Laterality Date  . ABDOMINAL HYSTERECTOMY    . FRACTURE SURGERY  [id number]   Operative repair with intramedullary nail intratrochanteric Left Hip fracture  . INTRAMEDULLARY (IM) NAIL INTERTROCHANTERIC Left 12/01/2015   Procedure: INTRAMEDULLARY (IM) NAIL INTERTROCHANTRIC;  Surgeon: Carole Civil, MD;  Location: [facility];  Service: Orthopedics;  Laterality: Left;  . MITRAL VALVE REPLACEMENT      There were no vitals filed for this visit.  Subjective Assessment - 07/14/18 1624    Subjective   S: Son reports that MD appointment went well. An X-ray was completed and everything is healed.     Patient is accompained by:  Family member   Son   Currently in Pain?  No/denies          [facility] OT Assessment - 07/15/18 0808      Assessment   Medical Diagnosis  right proximal humerus fracture      Precautions   Precautions  Other (comment)    Precaution Comments  History of Dementia                OT Treatments/Exercises (OP) - 07/14/18 1627      Exercises   Exercises  Shoulder      Shoulder Exercises: Supine   Protraction  PROM;5 reps;AAROM;10 reps    Horizontal ABduction  PROM;AROM;10 reps    External Rotation  PROM;5 reps;AAROM;10 reps    Internal Rotation  PROM;5 reps;AAROM;10 reps    Flexion  PROM;5 reps;AAROM;10 reps    ABduction  PROM;AROM;10 reps      Shoulder Exercises: Seated   Protraction  AROM;10 reps    Horizontal ABduction  AROM;10 reps    Flexion  AROM;10 reps    Abduction  AROM;10 reps      Shoulder Exercises: Pulleys   Flexion  1 minute   standing     Shoulder Exercises: ROM/Strengthening   Other ROM/Strengthening Exercises  PVC pipe slide; seated; 10X               OT Short Term Goals -  06/26/18 1314      OT SHORT TERM GOAL #1   Title  Patient and family will be educated and independent with HEP in order to faciliate progress in therapy and return to using her RUE for daily tasks.     Time  4    Period  Weeks    Status  On-going      OT SHORT TERM GOAL #2   Title  Patient will increase her A/ROM in her RUE to [facility] in order to return to typical gym routine with less difficulty.     Time  4    Period  Weeks    Status  On-going      OT SHORT TERM GOAL #3   Title  Patient will report a decreased pain amount in her RUE while returning to using the Biodex bike at the [facility] with increased comfort level.     Time  4    Period  Weeks    Status  On-going      OT SHORT TERM GOAL #4   Title  Patient will increase RUE strength to 4/5 in order to feel confident while completing UB strengthening exercises when attending the [facility].     Time  4    Period  Weeks    Status  On-going      OT SHORT TERM GOAL #5    Status  On-going               Plan - 07/15/18 2774    Clinical Impression Statement  A: MD appointment went well per Son. X-ray was completed and everything looks great. Progressed to A/ROM for most of exercises supine and seated. Unable to reach 2nd shelf during functional reaching task with cones and remained at first shelf. VC for form and technique.     Plan  P: Continue with A/ROM supine and seated. Continue to work on functional reaching to increase ROM of RUE.     Consulted and Agree with Plan of Care  Patient;Family member/caregiver    Family Member Consulted  Son       Patient will benefit from skilled therapeutic intervention in order to improve the following deficits and impairments:  Decreased range of motion, Pain, Impaired UE functional use, Decreased strength  Visit Diagnosis: Other symptoms and signs involving the musculoskeletal system  Acute pain of right shoulder  Stiffness of right shoulder, not elsewhere classified    Problem List Patient Active Problem List   Diagnosis Date Noted  . Frequent falls 05/14/2018  . Hypercalcemia 05/14/2018  . Closed fracture of right proximal humerus 05/14/2018  . Periorbital hematoma of right eye 05/14/2018  . Encounter for therapeutic drug monitoring 03/11/2018  . T12 compression fracture ([place]) 04/22/2017  . Dementia, multi-infarct ([place]) 04/22/2017  . Cardioembolic stroke ([place]) 12/87/8676  . B12 deficiency 04/22/2017  . Cough 12/16/2015  . Osteoporosis 12/05/2015  . Biatrial enlargement 12/01/2015  . Pulmonary hypertension, moderate to severe ([place]) 12/01/2015  . Closed fracture of intertrochanteric section of femur ([place])   . Chronic atrial fibrillation 11/30/2015  . Hypothyroidism 11/29/2015  . Thrombocytopenia ([place]) 07/13/2015  . Chronic anticoagulation 07/13/2015  . S/P mitral valve replacement 07/13/2015  . Vascular dementia, due to brain bleed from elevated INR, not due to ischemic strokes.  07/12/2015  .  Essential hypertension 07/12/2015   Ailene Ravel, OTR/L,CBIS  [phone]  07/15/2018, 8:15 AM  [place], [place], [address] Phone:  [phone]   Fax:  [phone]  Name: Courtney [name] MRN: [id number] Date of Birth: 1929-02-26

## 2018-07-16 ENCOUNTER — Encounter (HOSPITAL_COMMUNITY): Payer: Self-pay

## 2018-07-16 ENCOUNTER — Ambulatory Visit (HOSPITAL_COMMUNITY): Payer: Medicare Other

## 2018-07-16 ENCOUNTER — Ambulatory Visit (INDEPENDENT_AMBULATORY_CARE_PROVIDER_SITE_OTHER): Payer: Medicare Other | Admitting: *Deleted

## 2018-07-16 DIAGNOSIS — Z5181 Encounter for therapeutic drug level monitoring: Secondary | ICD-10-CM

## 2018-07-16 DIAGNOSIS — R29898 Other symptoms and signs involving the musculoskeletal system: Secondary | ICD-10-CM

## 2018-07-16 DIAGNOSIS — M25611 Stiffness of right shoulder, not elsewhere classified: Secondary | ICD-10-CM

## 2018-07-16 DIAGNOSIS — I639 Cerebral infarction, unspecified: Secondary | ICD-10-CM | POA: Diagnosis not present

## 2018-07-16 DIAGNOSIS — I482 Chronic atrial fibrillation, unspecified: Secondary | ICD-10-CM | POA: Diagnosis not present

## 2018-07-16 DIAGNOSIS — M25511 Pain in right shoulder: Secondary | ICD-10-CM

## 2018-07-16 DIAGNOSIS — Z952 Presence of prosthetic heart valve: Secondary | ICD-10-CM

## 2018-07-16 LAB — POCT INR: INR: 2 (ref 2.0–3.0)

## 2018-07-16 MED ORDER — WARFARIN SODIUM 3 MG PO TABS: ORAL_TABLET | ORAL | 1 refills | Status: DC

## 2018-07-16 NOTE — Therapy (Signed)
[place], [place], [address] Phone: [phone]   Fax:  [phone]  Occupational Therapy Treatment  Patient Details  Name: Courtney [name] MRN: [id number] Date of Birth: 19-Sep-1928 Referring Provider (OT): Dr. Beverly Gust    Encounter Date: 07/16/2018  OT End of Session - 07/16/18 1544    Visit Number  8    Number of Visits  8    Date for OT Re-Evaluation  07/23/18    Authorization Type  [employer] medicare    Authorization Time Period  $20 co pay per visit    OT Start Time  1435    OT Stop Time  1515    OT Time Calculation (min)  40 min    Activity Tolerance  Patient tolerated treatment well    Behavior During Therapy  [facility] for tasks assessed/performed       Past Medical History:  Diagnosis Date  . Biatrial enlargement 12/01/2015  . Chronic anticoagulation    coumadin, CHADS2VASC=6  . History of mitral valve replacement   . Hypertension   . Hypothyroidism   . Osteoporosis    on Boniva  . Permanent atrial fibrillation   . Pulmonary hypertension, moderate to severe ([place]) 12/01/2015  . Stroke [facility])    on Clopidrigel  . UTI (lower urinary tract infection) 07/2015   n&v; AKI  . Vascular dementia [facility])     Past Surgical History:  Procedure Laterality Date  . ABDOMINAL HYSTERECTOMY    . FRACTURE SURGERY  [id number]   Operative repair with intramedullary nail intratrochanteric Left Hip fracture  . INTRAMEDULLARY (IM) NAIL INTERTROCHANTERIC Left 12/01/2015   Procedure: INTRAMEDULLARY (IM) NAIL INTERTROCHANTRIC;  Surgeon: Carole Civil, MD;  Location: [facility];  Service: Orthopedics;  Laterality: Left;  . MITRAL VALVE REPLACEMENT      There were no vitals filed for this visit.  Subjective Assessment - 07/16/18 1446    Subjective   S: Nothing new to report.     Currently in Pain?  No/denies         [facility] OT Assessment - 07/16/18 1446      Assessment   Medical Diagnosis  right proximal humerus fracture       Precautions   Precautions  Other (comment)    Precaution Comments  History of Dementia                OT Treatments/Exercises (OP) - 07/16/18 1446      Exercises   Exercises  Shoulder      Shoulder Exercises: Supine   Protraction  PROM;5 reps;AROM;10 reps    Horizontal ABduction  PROM;AROM;10 reps    External Rotation  PROM;5 reps    Internal Rotation  PROM;5 reps    Flexion  PROM;5 reps;AROM;10 reps    ABduction  PROM;AROM;10 reps      Shoulder Exercises: Seated   Protraction  AROM;10 reps    Horizontal ABduction  AROM;10 reps    External Rotation  AROM;10 reps    Internal Rotation  AROM;10 reps    Flexion  AROM;10 reps    Abduction  AROM;10 reps      Shoulder Exercises: Pulleys   Flexion  1 minute   standing     Shoulder Exercises: ROM/Strengthening   UBE (Upper Arm Bike)  Level 1 1' forward and 1' backward    Contact verbal cues.    Wall Wash  1'    Proximal Shoulder Strengthening, Supine  10X with verbal  and visual cues      Functional Reaching Activities   Mid Level  Functional reaching task completed with yellow and red clothespins while attempting to place pins at highest spot on vertical pole as possible.                 OT Short Term Goals - 06/26/18 1314      OT SHORT TERM GOAL #1   Title  Patient and family will be educated and independent with HEP in order to faciliate progress in therapy and return to using her RUE for daily tasks.     Time  4    Period  Weeks    Status  On-going      OT SHORT TERM GOAL #2   Title  Patient will increase her A/ROM in her RUE to [facility] in order to return to typical gym routine with less difficulty.     Time  4    Period  Weeks    Status  On-going      OT SHORT TERM GOAL #3   Title  Patient will report a decreased pain amount in her RUE while returning to using the [employer] bike at the [facility] with increased comfort level.     Time  4    Period  Weeks    Status  On-going      OT SHORT TERM GOAL #4    Title  Patient will increase RUE strength to 4/5 in order to feel confident while completing UB strengthening exercises when attending the [facility].     Time  4    Period  Weeks    Status  On-going      OT SHORT TERM GOAL #5   Status  On-going               Plan - 07/16/18 1545    Clinical Impression Statement  A: pt was able to complete all A/ROM supine and seated and UBE bike to work towards returning to using the [employer] bike at [employer]. Verbal and visual cues were provided for all exercise for proper form and completion.     Plan  P: reassess and discharge.     Consulted and Agree with Plan of Care  Patient;Family member/caregiver    Family Member Consulted  Son       Patient will benefit from skilled therapeutic intervention in order to improve the following deficits and impairments:  Decreased range of motion, Pain, Impaired UE functional use, Decreased strength  Visit Diagnosis: Other symptoms and signs involving the musculoskeletal system  Acute pain of right shoulder  Stiffness of right shoulder, not elsewhere classified    Problem List Patient Active Problem List   Diagnosis Date Noted  . Frequent falls 05/14/2018  . Hypercalcemia 05/14/2018  . Closed fracture of right proximal humerus 05/14/2018  . Periorbital hematoma of right eye 05/14/2018  . Encounter for therapeutic drug monitoring 03/11/2018  . T12 compression fracture ([place]) 04/22/2017  . Dementia, multi-infarct ([place]) 04/22/2017  . Cardioembolic stroke ([place]) 08/13/3233  . B12 deficiency 04/22/2017  . Cough 12/16/2015  . Osteoporosis 12/05/2015  . Biatrial enlargement 12/01/2015  . Pulmonary hypertension, moderate to severe ([place]) 12/01/2015  . Closed fracture of intertrochanteric section of femur ([place])   . Chronic atrial fibrillation 11/30/2015  . Hypothyroidism 11/29/2015  . Thrombocytopenia ([place]) 07/13/2015  . Chronic anticoagulation 07/13/2015  . S/P mitral valve replacement 07/13/2015  .  Vascular dementia, due to brain bleed from elevated  INR, not due to ischemic strokes.  07/12/2015  . Essential hypertension 07/12/2015   Ailene Ravel, OTR/L,CBIS  [phone]  07/16/2018, 3:53 PM  [place] [address] [place], [place], [address] Phone: [phone]   Fax:  [phone]  Name: Lorilyn Laitinen MRN: [id number] Date of Birth: 16-Mar-1929

## 2018-07-16 NOTE — Patient Instructions (Signed)
Take coumadin 4mg  tonight then resume coumadin to 3mg  daily except 4mg  on Wednesdays Recheck in 4 weeks

## 2018-07-20 ENCOUNTER — Telehealth (HOSPITAL_COMMUNITY): Payer: Self-pay | Admitting: General Practice

## 2018-07-20 NOTE — Telephone Encounter (Signed)
07/20/18  son called to cx said she had a cold

## 2018-07-21 ENCOUNTER — Ambulatory Visit (HOSPITAL_COMMUNITY): Payer: Medicare Other

## 2018-07-22 ENCOUNTER — Telehealth (HOSPITAL_COMMUNITY): Payer: Self-pay | Admitting: General Practice

## 2018-07-22 ENCOUNTER — Ambulatory Visit (HOSPITAL_COMMUNITY): Payer: Medicare Other | Admitting: Occupational Therapy

## 2018-07-22 NOTE — Telephone Encounter (Signed)
07/22/18  son called to say that she was still sick and we rescehduled the appt

## 2018-07-30 ENCOUNTER — Encounter (HOSPITAL_COMMUNITY): Payer: Self-pay | Admitting: Occupational Therapy

## 2018-07-30 ENCOUNTER — Ambulatory Visit (HOSPITAL_COMMUNITY): Payer: Medicare Other | Admitting: Occupational Therapy

## 2018-07-30 DIAGNOSIS — M25611 Stiffness of right shoulder, not elsewhere classified: Secondary | ICD-10-CM

## 2018-07-30 DIAGNOSIS — M25511 Pain in right shoulder: Secondary | ICD-10-CM

## 2018-07-30 DIAGNOSIS — R29898 Other symptoms and signs involving the musculoskeletal system: Secondary | ICD-10-CM

## 2018-07-30 NOTE — Therapy (Signed)
[place], [place], [address] Phone: [phone]   Fax:  [phone]  Occupational Therapy Reassessment, Treatment, Discharge Summary  Patient Details  Name: Courtney [name] MRN: [id number] Date of Birth: 14-Mar-1929 Referring Provider (OT): Dr. Beverly Gust   Progress Note Reporting Period 06/24/2018 to 07/30/2018  See note below for Objective Data and Assessment of Progress/Goals.      Encounter Date: 07/30/2018  OT End of Session - 07/30/18 1434    Visit Number  9    Number of Visits  9    Date for OT Re-Evaluation  07/23/18    Authorization Type  [employer] medicare    Authorization Time Period  $20 co pay per visit    OT Start Time  1349    OT Stop Time  1427    OT Time Calculation (min)  38 min    Activity Tolerance  Patient tolerated treatment well    Behavior During Therapy  WFL for tasks assessed/performed       Past Medical History:  Diagnosis Date  . Biatrial enlargement 12/01/2015  . Chronic anticoagulation    coumadin, CHADS2VASC=6  . History of mitral valve replacement   . Hypertension   . Hypothyroidism   . Osteoporosis    on Boniva  . Permanent atrial fibrillation   . Pulmonary hypertension, moderate to severe ([place]) 12/01/2015  . Stroke [facility])    on Clopidrigel  . UTI (lower urinary tract infection) 07/2015   n&v; AKI  . Vascular dementia [facility])     Past Surgical History:  Procedure Laterality Date  . ABDOMINAL HYSTERECTOMY    . FRACTURE SURGERY  [id number]   Operative repair with intramedullary nail intratrochanteric Left Hip fracture  . INTRAMEDULLARY (IM) NAIL INTERTROCHANTERIC Left 12/01/2015   Procedure: INTRAMEDULLARY (IM) NAIL INTERTROCHANTRIC;  Surgeon: Carole Civil, MD;  Location: [facility];  Service: Orthopedics;  Laterality: Left;  . MITRAL VALVE REPLACEMENT      There were no vitals filed for this visit.  Subjective Assessment - 07/30/18 1353    Subjective   S: I'm not in  any pain.     Currently in Pain?  No/denies         [facility] OT Assessment - 07/30/18 1353      Assessment   Medical Diagnosis  right proximal humerus fracture      Precautions   Precautions  Other (comment)    Precaution Comments  History of Dementia       AROM   Overall AROM Comments  Assessed standing. IR/er adducted    AROM Assessment Site  Shoulder    Right/Left Shoulder  Right    Right Shoulder Flexion  95 Degrees   85 previous   Right Shoulder ABduction  85 Degrees   65 previous   Right Shoulder Internal Rotation  90 Degrees   same as previous   Right Shoulder External Rotation  40 Degrees   same as previous     PROM   Overall PROM Comments  Assessed supine IR/er adducted    PROM Assessment Site  Shoulder    Right/Left Shoulder  Right    Right Shoulder Flexion  130 Degrees   same as previous   Right Shoulder ABduction  90 Degrees   81 previous   Right Shoulder Internal Rotation  90 Degrees   same as previous   Right Shoulder External Rotation  49 Degrees   44 previous     Strength  Overall Strength Comments  Assessed seated. IR/er adducted    Strength Assessment Site  Shoulder    Right/Left Shoulder  Right    Right Shoulder Flexion  3-/5   same as previous   Right Shoulder ABduction  3-/5   same as previous   Right Shoulder Internal Rotation  4-/5   same as previous   Right Shoulder External Rotation  3+/5   same as previous              OT Treatments/Exercises (OP) - 07/30/18 1353      Exercises   Exercises  Shoulder      Shoulder Exercises: Supine   Protraction  PROM;5 reps;AROM;10 reps    Horizontal ABduction  PROM;5 reps;AROM;10 reps    External Rotation  PROM;5 reps;AROM;10 reps    Internal Rotation  PROM;5 reps    Flexion  PROM;5 reps;AROM;10 reps    ABduction  PROM;5 reps;AROM;10 reps      Shoulder Exercises: Seated   Protraction  AROM;10 reps    Horizontal ABduction  AROM;10 reps    External Rotation  AROM;10 reps     Internal Rotation  AROM;10 reps    Flexion  AROM;10 reps    Abduction  AROM;10 reps      Shoulder Exercises: ROM/Strengthening   UBE (Upper Arm Bike)  Level 1 1' forward and 1' backward    contact verbal cues   Wall Wash  1'               OT Short Term Goals - 07/30/18 1434      OT SHORT TERM GOAL #1   Title  Patient and family will be educated and independent with HEP in order to faciliate progress in therapy and return to using her RUE for daily tasks.     Time  4    Period  Weeks    Status  Achieved      OT SHORT TERM GOAL #2   Title  Patient will increase her A/ROM in her RUE to [facility] in order to return to typical gym routine with less difficulty.     Time  4    Period  Weeks    Status  Partially Met      OT SHORT TERM GOAL #3   Title  Patient will report a decreased pain amount in her RUE while returning to using the [employer] bike at the [facility] with increased comfort level.     Time  4    Period  Weeks    Status  Achieved      OT SHORT TERM GOAL #4   Title  Patient will increase RUE strength to 4/5 in order to feel confident while completing UB strengthening exercises when attending the [facility].     Time  4    Period  Weeks    Status  Not Met               Plan - 07/30/18 1520    Clinical Impression Statement  A: Reassessment completed this session, pt has met 2/4 goals with one additional goal partially met. Pt has made progress with ROM at 50% range and strength functional for daily tasks. Son reports pt is doing great and has continued to attend the [facility]. Session today focuing on ROM/strengthening exercises, constant verbal and visual cuing for form and technique required. Pt agreeable to discharge today and will continue to attend the [facility] for further strengthening.     Plan  P: Discharge pt       Patient will benefit from skilled therapeutic intervention in order to improve the following deficits and impairments:  Decreased range of motion, Pain, Impaired  UE functional use, Decreased strength  Visit Diagnosis: Other symptoms and signs involving the musculoskeletal system  Acute pain of right shoulder  Stiffness of right shoulder, not elsewhere classified    Problem List Patient Active Problem List   Diagnosis Date Noted  . Frequent falls 05/14/2018  . Hypercalcemia 05/14/2018  . Closed fracture of right proximal humerus 05/14/2018  . Periorbital hematoma of right eye 05/14/2018  . Encounter for therapeutic drug monitoring 03/11/2018  . T12 compression fracture ([place]) 04/22/2017  . Dementia, multi-infarct ([place]) 04/22/2017  . Cardioembolic stroke ([place]) 83/67/2550  . B12 deficiency 04/22/2017  . Cough 12/16/2015  . Osteoporosis 12/05/2015  . Biatrial enlargement 12/01/2015  . Pulmonary hypertension, moderate to severe ([place]) 12/01/2015  . Closed fracture of intertrochanteric section of femur ([place])   . Chronic atrial fibrillation 11/30/2015  . Hypothyroidism 11/29/2015  . Thrombocytopenia ([place]) 07/13/2015  . Chronic anticoagulation 07/13/2015  . S/P mitral valve replacement 07/13/2015  . Vascular dementia, due to brain bleed from elevated INR, not due to ischemic strokes.  07/12/2015  . Essential hypertension 07/12/2015   Guadelupe Sabin, OTR/L  [phone] 07/30/2018, 3:23 PM  [place] [address] [place], [place], [address] Phone: [phone]   Fax:  [phone]  Name: Courtney [name] MRN: [id number] Date of Birth: 1929/05/12   OCCUPATIONAL THERAPY DISCHARGE SUMMARY  Visits from Start of Care: 9  Current functional level related to goals / functional outcomes: See above. Pt is completing B/ADL tasks baseline independence, does not complain of shoulder pain during tasks.    Remaining deficits: ROM at 50%, strength deficits   Education / Equipment: Educated pt and son on continuing to attend the [facility] and educated on appropriate exercises to complete.  Plan: Patient agrees  to discharge.  Patient goals were partially met. Patient is being discharged due to being pleased with the current functional level.  ?????

## 2018-08-09 ENCOUNTER — Other Ambulatory Visit: Payer: Self-pay | Admitting: Cardiovascular Disease

## 2018-08-13 ENCOUNTER — Ambulatory Visit (INDEPENDENT_AMBULATORY_CARE_PROVIDER_SITE_OTHER): Payer: Medicare Other | Admitting: Pharmacist

## 2018-08-13 DIAGNOSIS — Z5181 Encounter for therapeutic drug level monitoring: Secondary | ICD-10-CM

## 2018-08-13 DIAGNOSIS — I639 Cerebral infarction, unspecified: Secondary | ICD-10-CM

## 2018-08-13 DIAGNOSIS — Z952 Presence of prosthetic heart valve: Secondary | ICD-10-CM | POA: Diagnosis not present

## 2018-08-13 DIAGNOSIS — I482 Chronic atrial fibrillation, unspecified: Secondary | ICD-10-CM

## 2018-08-13 LAB — POCT INR: INR: 3.8 — AB (ref 2.0–3.0)

## 2018-08-13 NOTE — Patient Instructions (Signed)
Description   No warfarin tomorrow then resume coumadin to 3mg  daily except 4mg  on Wednesdays Recheck in 4 weeks

## 2018-09-17 ENCOUNTER — Ambulatory Visit (INDEPENDENT_AMBULATORY_CARE_PROVIDER_SITE_OTHER): Payer: Medicare Other | Admitting: Pharmacist

## 2018-09-17 DIAGNOSIS — Z952 Presence of prosthetic heart valve: Secondary | ICD-10-CM

## 2018-09-17 DIAGNOSIS — Z5181 Encounter for therapeutic drug level monitoring: Secondary | ICD-10-CM | POA: Diagnosis not present

## 2018-09-17 DIAGNOSIS — I482 Chronic atrial fibrillation, unspecified: Secondary | ICD-10-CM | POA: Diagnosis not present

## 2018-09-17 DIAGNOSIS — I639 Cerebral infarction, unspecified: Secondary | ICD-10-CM | POA: Diagnosis not present

## 2018-09-17 LAB — POCT INR: INR: 2.6 (ref 2.0–3.0)

## 2018-09-17 NOTE — Patient Instructions (Signed)
Description   Continue coumadin to 3mg  daily except 4mg  on Wednesdays Recheck in 6 weeks

## 2018-10-12 ENCOUNTER — Encounter: Payer: Self-pay | Admitting: Obstetrics & Gynecology

## 2018-10-23 ENCOUNTER — Encounter (INDEPENDENT_AMBULATORY_CARE_PROVIDER_SITE_OTHER): Payer: Self-pay | Admitting: Internal Medicine

## 2018-10-27 ENCOUNTER — Telehealth: Payer: Self-pay | Admitting: Pharmacist

## 2018-10-27 NOTE — Telephone Encounter (Signed)

## 2018-10-27 NOTE — Telephone Encounter (Deleted)
1. Do you currently have a fever?NO (yes = cancel and refer to pcp for e-visit) 2. Have you recently travelled on a cruise, internationally, or to [place], [place], [place], [place], [place], or [place], [place] [employer]) ? NO (yes = cancel, stay home, monitor symptoms, and contact pcp or initiate e-visit if symptoms develop) 3. Have you been in contact with someone that is currently pending confirmation of Covid19 testing or has been confirmed to have the [place] virus?  NO (yes = cancel, stay home, away from tested individual, monitor symptoms, and contact pcp or initiate e-visit if symptoms develop) 4. Are you currently experiencing fatigue or cough? NO (yes = pt should be prepared to have a mask placed at the time of their visit).  Pt. Advised that we are restricting visitors at this time and anyone present in the vehicle should meet the above criteria as well. Advised that visit will be at curbside for finger stick ONLY and will receive call with instructions. Pt also advised to please bring own pen for signature of arrival document.   RESCHEDULED PT TO 1130 ON Thursday IN [place] SUPPLE IT WONT LET ME CLOSE

## 2018-10-29 ENCOUNTER — Ambulatory Visit (INDEPENDENT_AMBULATORY_CARE_PROVIDER_SITE_OTHER): Payer: Medicare Other | Admitting: *Deleted

## 2018-10-29 DIAGNOSIS — Z952 Presence of prosthetic heart valve: Secondary | ICD-10-CM

## 2018-10-29 DIAGNOSIS — I482 Chronic atrial fibrillation, unspecified: Secondary | ICD-10-CM

## 2018-10-29 DIAGNOSIS — Z5181 Encounter for therapeutic drug level monitoring: Secondary | ICD-10-CM | POA: Diagnosis not present

## 2018-10-29 DIAGNOSIS — I639 Cerebral infarction, unspecified: Secondary | ICD-10-CM | POA: Diagnosis not present

## 2018-10-29 LAB — POCT INR: INR: 2.1 (ref 2.0–3.0)

## 2018-10-29 NOTE — Patient Instructions (Signed)
Take coumadin 6mg  tonight then resume 3mg  daily except 4mg  on Wednesdays Recheck in 6 weeks

## 2018-11-02 ENCOUNTER — Encounter: Payer: Medicare Other | Admitting: Obstetrics & Gynecology

## 2018-11-29 ENCOUNTER — Encounter: Payer: Self-pay | Admitting: Obstetrics & Gynecology

## 2018-11-29 ENCOUNTER — Encounter: Payer: Self-pay | Admitting: Orthopaedic Surgery

## 2018-11-29 ENCOUNTER — Encounter: Payer: Self-pay | Admitting: Orthopedic Surgery

## 2018-12-08 ENCOUNTER — Telehealth: Payer: Self-pay | Admitting: *Deleted

## 2018-12-08 NOTE — Telephone Encounter (Signed)

## 2018-12-09 ENCOUNTER — Ambulatory Visit (INDEPENDENT_AMBULATORY_CARE_PROVIDER_SITE_OTHER): Payer: Medicare Other | Admitting: *Deleted

## 2018-12-09 DIAGNOSIS — Z5181 Encounter for therapeutic drug level monitoring: Secondary | ICD-10-CM | POA: Diagnosis not present

## 2018-12-09 DIAGNOSIS — I482 Chronic atrial fibrillation, unspecified: Secondary | ICD-10-CM | POA: Diagnosis not present

## 2018-12-09 DIAGNOSIS — I639 Cerebral infarction, unspecified: Secondary | ICD-10-CM | POA: Diagnosis not present

## 2018-12-09 DIAGNOSIS — Z952 Presence of prosthetic heart valve: Secondary | ICD-10-CM

## 2018-12-09 LAB — POCT INR: INR: 2.2 (ref 2.0–3.0)

## 2018-12-09 NOTE — Patient Instructions (Signed)
Continue coumadin 3mg  daily except 4mg  on Wednesdays Recheck in 6 weeks

## 2018-12-18 ENCOUNTER — Telehealth: Payer: Self-pay | Admitting: Obstetrics & Gynecology

## 2018-12-18 NOTE — Telephone Encounter (Signed)
Spoke with Patient's Son [name] and screened him and Mrs. Cuaresma for the Covid-19. Both patient and son have no been in contact with anyone who was confirmed or suspected of having the covid. Both patient and her son are not experiencing any of the symptoms that I listed off. Also both the patient and the son have not traveled internationally in the last month.

## 2018-12-21 ENCOUNTER — Ambulatory Visit: Payer: Medicare Other | Admitting: Obstetrics & Gynecology

## 2018-12-21 ENCOUNTER — Other Ambulatory Visit: Payer: Self-pay

## 2018-12-21 ENCOUNTER — Encounter: Payer: Self-pay | Admitting: Obstetrics & Gynecology

## 2018-12-21 VITALS — BP 133/72 | HR 70 | Ht 62.0 in | Wt 113.0 lb

## 2018-12-21 DIAGNOSIS — N362 Urethral caruncle: Secondary | ICD-10-CM | POA: Diagnosis not present

## 2018-12-21 NOTE — Progress Notes (Signed)
Chief Complaint  Patient presents with  . vaginal bleeding 4 months ago    noticed it after exercising on bike      85 y.o. G3P2011 No LMP recorded. Patient has had a hysterectomy. The current method of family planning is status post hysterectomy.  Outpatient Encounter Medications as of 12/21/2018  Medication Sig Note  . acetaminophen (TYLENOL) 500 MG tablet Take 500 mg by mouth every 6 (six) hours as needed for mild pain. 02/01/2017: Patient rarely takes  . alendronate (FOSAMAX) 70 MG tablet once a week.    Marland Kitchen amLODipine (NORVASC) 2.5 MG tablet Take 1 tablet (2.5 mg total) by mouth daily.   Marland Kitchen atenolol (TENORMIN) 25 MG tablet Take 0.5 tablets (12.5 mg total) by mouth daily.   Marland Kitchen b complex vitamins tablet Take 1 tablet by mouth daily.   . clopidogrel (PLAVIX) 75 MG tablet Take 1 tablet (75 mg total) by mouth daily.   Marland Kitchen HYDROcodone-acetaminophen (NORCO/VICODIN) 5-325 MG tablet Take 1 tablet by mouth every 4 (four) hours as needed for moderate pain.   Marland Kitchen levothyroxine (SYNTHROID) 100 MCG tablet Take 100 mcg by mouth daily before breakfast.   . lisinopril (PRINIVIL,ZESTRIL) 2.5 MG tablet Take 1 tablet (2.5 mg total) by mouth daily.   . vitamin C (ASCORBIC ACID) 250 MG tablet Take 250 mg by mouth daily.    Marland Kitchen VITAMIN D, ERGOCALCIFEROL, PO Take 250 mg by mouth. Three times a week   . warfarin (COUMADIN) 3 MG tablet TAKE WARFARIN 3 MG MON, TUES, THU, FRI, SAT, SUN (EVERY NIGHT EXCEPT WEDNESDAY) TAKES 4MG  ON WEDNESDAYS   . [DISCONTINUED] ibandronate (BONIVA) 150 MG tablet Take 1 tablet (150 mg total) by mouth every 30 (thirty) days. Take in the morning with a full glass of water, on an empty stomach, and do not take anything else by mouth or lie down for the next 30 min.   . [DISCONTINUED] senna-docusate (SENOKOT-S) 8.6-50 MG tablet Take 1 tablet by mouth at bedtime.   . [DISCONTINUED] thyroid (ARMOUR) 90 MG tablet Take 90 mg by mouth daily.   . [DISCONTINUED] triamterene-hydrochlorothiazide  (DYAZIDE) 37.5-25 MG capsule Take 1 each (1 capsule total) by mouth every other day.   . [DISCONTINUED] warfarin (COUMADIN) 1 MG tablet Take 1.5 tablets (1.5 mg total) by mouth daily at 6 PM for 2 days.   . [DISCONTINUED] warfarin (COUMADIN) 2 MG tablet    . [DISCONTINUED] warfarin (COUMADIN) 4 MG tablet Take 4 mg weekly on Wednesday.    No facility-administered encounter medications on file as of 12/21/2018.     Subjective Pt had 7 days of progressively lighter bleeding 4 months ago after a long stent on a stationary bike  No pain No bleeding since On 2 anticoagulant for embolic strokes  Past Medical History:  Diagnosis Date  . Biatrial enlargement 12/01/2015  . Chronic anticoagulation    coumadin, CHADS2VASC=6  . History of mitral valve replacement   . Hypertension   . Hypothyroidism   . Osteoporosis    on Boniva  . Permanent atrial fibrillation   . Pulmonary hypertension, moderate to severe ([place]) 12/01/2015  . Stroke [facility])    on Clopidrigel  . UTI (lower urinary tract infection) 07/2015   n&v; AKI  . Vascular dementia [facility])     Past Surgical History:  Procedure Laterality Date  . ABDOMINAL HYSTERECTOMY    . FRACTURE SURGERY  [id number]   Operative repair with intramedullary nail intratrochanteric Left Hip fracture  . INTRAMEDULLARY (  IM) NAIL INTERTROCHANTERIC Left 12/01/2015   Procedure: INTRAMEDULLARY (IM) NAIL INTERTROCHANTRIC;  Surgeon: Carole Civil, MD;  Location: [facility];  Service: Orthopedics;  Laterality: Left;  . MITRAL VALVE REPLACEMENT      OB History    Gravida  3   Para  2   Term  2   Preterm      AB  1   Living  1     SAB  1   TAB      Ectopic      Multiple      Live Births  2           Allergies  Allergen Reactions  . Penicillins Swelling    Has patient had a PCN reaction causing immediate rash, facial/tongue/throat swelling, SOB or lightheadedness with hypotension: Yes Has patient had a PCN reaction causing severe rash  involving mucus membranes or skin necrosis: No Has patient had a PCN reaction that required hospitalization No Has patient had a PCN reaction occurring within the last 10 years: No If all of the above answers are "NO", then may proceed with Cephalosporin use. Whole body "swelled up" and had to go to the hospital    Social History   Socioeconomic History  . Marital status: Divorced    Spouse name: Not on file  . Number of children: Not on file  . Years of education: Not on file  . Highest education level: Not on file  Occupational History  . Occupation: Retired  Scientific laboratory technician  . Financial resource strain: Not on file  . Food insecurity:    Worry: Not on file    Inability: Not on file  . Transportation needs:    Medical: Not on file    Non-medical: Not on file  Tobacco Use  . Smoking status: Former Smoker    Types: Cigarettes  . Smokeless tobacco: Never Used  Substance and Sexual Activity  . Alcohol use: No  . Drug use: No  . Sexual activity: Not Currently    Birth control/protection: Surgical    Comment: hyst  Lifestyle  . Physical activity:    Days per week: Not on file    Minutes per session: Not on file  . Stress: Not on file  Relationships  . Social connections:    Talks on phone: Not on file    Gets together: Not on file    Attends religious service: Not on file    Active member of club or organization: Not on file    Attends meetings of clubs or organizations: Not on file    Relationship status: Not on file  Other Topics Concern  . Not on file  Social History Narrative   Lives with son when in [place]. Has a house in [place], [place]. Husband died.    Had a son that committed suicide. Has dementia.     Family History  Problem Relation Age of Onset  . Heart disease Mother   . Heart disease Father   . Suicidality Son   . Stroke Neg Hx   . Cancer Neg Hx   . Diabetes Neg Hx     Medications:       Current Outpatient Medications:  .  acetaminophen  (TYLENOL) 500 MG tablet, Take 500 mg by mouth every 6 (six) hours as needed for mild pain., Disp: , Rfl:  .  alendronate (FOSAMAX) 70 MG tablet, once a week. , Disp: , Rfl:  .  amLODipine (NORVASC) 2.5 MG  tablet, Take 1 tablet (2.5 mg total) by mouth daily., Disp: 90 tablet, Rfl: 3 .  atenolol (TENORMIN) 25 MG tablet, Take 0.5 tablets (12.5 mg total) by mouth daily., Disp: 45 tablet, Rfl: 1 .  b complex vitamins tablet, Take 1 tablet by mouth daily., Disp: , Rfl:  .  clopidogrel (PLAVIX) 75 MG tablet, Take 1 tablet (75 mg total) by mouth daily., Disp: 90 tablet, Rfl: 1 .  HYDROcodone-acetaminophen (NORCO/VICODIN) 5-325 MG tablet, Take 1 tablet by mouth every 4 (four) hours as needed for moderate pain., Disp: 12 tablet, Rfl: 0 .  levothyroxine (SYNTHROID) 100 MCG tablet, Take 100 mcg by mouth daily before breakfast., Disp: , Rfl:  .  lisinopril (PRINIVIL,ZESTRIL) 2.5 MG tablet, Take 1 tablet (2.5 mg total) by mouth daily., Disp: 90 tablet, Rfl: 0 .  vitamin C (ASCORBIC ACID) 250 MG tablet, Take 250 mg by mouth daily. , Disp: , Rfl:  .  VITAMIN D, ERGOCALCIFEROL, PO, Take 250 mg by mouth. Three times a week, Disp: , Rfl:  .  warfarin (COUMADIN) 3 MG tablet, TAKE WARFARIN 3 MG MON, TUES, THU, FRI, SAT, SUN (EVERY NIGHT EXCEPT WEDNESDAY) TAKES 4MG  ON WEDNESDAYS, Disp: 90 tablet, Rfl: 1  Objective Blood pressure 133/72, pulse 70, height 5\' 2"  (1.575 m), weight 113 lb (51.3 kg).  General WDWN female NAD, pt has dementia Vulva:  Atrophic no lesions Vagina:  normal mucosa, atrophic Cervix:  absent Uterus:  uterus absent Adnexa: ovaries:not present,    Pt does have a beefy red mass protruding from her urethra consistent with a urethral caruncle, photo documented with HAIKU, see below       Pertinent ROS No burning with urination, frequency or urgency No nausea, vomiting or diarrhea Nor fever chills or other constitutional symptoms   Labs or studies none    Impression Diagnoses this  Encounter::   ICD-10-CM   1. 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   Labs or Scans Ordered: No orders of the defined types were placed in this encounter.   Management:: See above note  Follow up Return if symptoms worsen or fail to improve.     All questions were answered.

## 2019-01-19 ENCOUNTER — Telehealth: Payer: Self-pay | Admitting: *Deleted

## 2019-01-19 NOTE — Telephone Encounter (Signed)

## 2019-01-20 ENCOUNTER — Ambulatory Visit (INDEPENDENT_AMBULATORY_CARE_PROVIDER_SITE_OTHER): Payer: Medicare Other | Admitting: *Deleted

## 2019-01-20 DIAGNOSIS — I482 Chronic atrial fibrillation, unspecified: Secondary | ICD-10-CM

## 2019-01-20 DIAGNOSIS — Z952 Presence of prosthetic heart valve: Secondary | ICD-10-CM | POA: Diagnosis not present

## 2019-01-20 DIAGNOSIS — Z5181 Encounter for therapeutic drug level monitoring: Secondary | ICD-10-CM | POA: Diagnosis not present

## 2019-01-20 DIAGNOSIS — I639 Cerebral infarction, unspecified: Secondary | ICD-10-CM

## 2019-01-20 LAB — POCT INR: INR: 2.3 (ref 2.0–3.0)

## 2019-01-20 NOTE — Patient Instructions (Signed)
Continue coumadin 3mg  daily except 4mg  on Wednesdays Recheck in 6 weeks

## 2019-01-21 ENCOUNTER — Ambulatory Visit: Payer: Medicare Other | Admitting: Cardiovascular Disease

## 2019-01-22 ENCOUNTER — Ambulatory Visit: Payer: Medicare Other | Admitting: Cardiovascular Disease

## 2019-02-14 NOTE — Progress Notes (Signed)
Cardiology Office Note   Date:  02/16/2019   ID:  Courtney [name], DOB Sep 20, 1928, MRN [id number]  PCP:  Doree Albee, MD  Cardiologist:   Kate Sable   No chief complaint on file.     History of Present Illness: Courtney [name] is a 85 y.o. female who presents for consultation regarding mitral valve disease and chronic atrial fibrillation  She has a history of CVA, HTN, vascular dementia MVR and chronic afib. MVR around Nov 26, 2000 done at [facility] in [place] by Dr Leeroy Bock . CHADS2VASD is 6. Echo ordered by Dr Bronson Ing 2015/11/27 preop hip fracture reviewed  EF 60-65% AV sclerosis St Jude mechanical MV normal function  Severe biatrial enlargement  Estimated PA pressure 65 mmHg   Last INR 01/20/19  Rx at 2.3  Her notes indicate she had cardio embolic stroke on coumadin alone and she Has been maintained on plavix and coumadin since  Son takes care of here Both concerned about lack of primary care f/u with Dr Mannie Stabile leaving Told them we could follow her INR / coumadin and refill her heart meds but not opioids on non cardiac meds  She ambulates with walker in house and cane outside.  No cardiac concerns  Divorced husband HISAE DECOURSEY died Nov 26, 2017 was a principle with doctorate in education from [employer] But cheated on Luzelena quite a bit  Past Medical History:  Diagnosis Date  . Biatrial enlargement 12/01/2015  . Chronic anticoagulation    coumadin, CHADS2VASC=6  . History of mitral valve replacement   . Hypertension   . Hypothyroidism   . Osteoporosis    on Boniva  . Permanent atrial fibrillation   . Pulmonary hypertension, moderate to severe ([place]) 12/01/2015  . Stroke [facility])    on Clopidrigel  . UTI (lower urinary tract infection) 07/2015   n&v; AKI  . Vascular dementia [facility])     Past Surgical History:  Procedure Laterality Date  . ABDOMINAL HYSTERECTOMY    . FRACTURE SURGERY  [id number]   Operative repair with intramedullary nail intratrochanteric Left Hip fracture  .  INTRAMEDULLARY (IM) NAIL INTERTROCHANTERIC Left 12/01/2015   Procedure: INTRAMEDULLARY (IM) NAIL INTERTROCHANTRIC;  Surgeon: Carole Civil, MD;  Location: [facility];  Service: Orthopedics;  Laterality: Left;  . MITRAL VALVE REPLACEMENT       Current Outpatient Medications  Medication Sig Dispense Refill  . acetaminophen (TYLENOL) 500 MG tablet Take 500 mg by mouth every 6 (six) hours as needed for mild pain.    Marland Kitchen alendronate (FOSAMAX) 70 MG tablet once a week.     Marland Kitchen amLODipine (NORVASC) 2.5 MG tablet Take 1 tablet (2.5 mg total) by mouth daily. 90 tablet 3  . atenolol (TENORMIN) 25 MG tablet Take 0.5 tablets (12.5 mg total) by mouth daily. 45 tablet 1  . b complex vitamins tablet Take 1 tablet by mouth daily.    . clopidogrel (PLAVIX) 75 MG tablet Take 1 tablet (75 mg total) by mouth daily. 90 tablet 1  . HYDROcodone-acetaminophen (NORCO/VICODIN) 5-325 MG tablet Take 1 tablet by mouth every 4 (four) hours as needed for moderate pain. 12 tablet 0  . levothyroxine (SYNTHROID) 100 MCG tablet Take 100 mcg by mouth daily before breakfast.    . lisinopril (PRINIVIL,ZESTRIL) 2.5 MG tablet Take 1 tablet (2.5 mg total) by mouth daily. 90 tablet 0  . VITAMIN D, ERGOCALCIFEROL, PO Take 250 mg by mouth. Three times a week    . warfarin (COUMADIN) 3 MG tablet TAKE  WARFARIN 3 MG MON, TUES, THU, FRI, SAT, SUN (EVERY NIGHT EXCEPT WEDNESDAY) TAKES 4MG  ON WEDNESDAYS 90 tablet 1  . vitamin C (ASCORBIC ACID) 250 MG tablet Take 250 mg by mouth daily.      No current facility-administered medications for this visit.     Allergies:   Penicillins    Social History:  The patient  reports that she has quit smoking. Her smoking use included cigarettes. She has never used smokeless tobacco. She reports that she does not drink alcohol or use drugs.   Family History:  The patient's family history includes Heart disease in her father and mother; Suicidality in her son.    ROS:  Please see the history of present  illness.   Otherwise, review of systems are positive for none.   All other systems are reviewed and negative.    PHYSICAL EXAM: VS:  BP (!) 141/75   Pulse (!) 59   Temp 98.6 F (37 C)   Wt 115 lb (52.2 kg)   BMI 21.03 kg/m  , BMI Body mass index is 21.03 kg/m. Affect appropriate Healthy:  appears stated age 67: normal Neck supple with no adenopathy JVP normal no bruits no thyromegaly Lungs clear with no wheezing and good diaphragmatic motion Heart:  S1/S2 no murmur, no rub, gallop or click PMI normal Abdomen: benighn, BS positve, no tenderness, no AAA no bruit.  No HSM or HJR Distal pulses intact with no bruits No edema Neuro non-focal Skin warm and dry No muscular weakness    EKG:  11/29/15 afib rate 66 LAD    Recent Labs: 05/13/2018: TSH 11.185 05/14/2018: BUN 21; Creatinine, Ser 0.98; Hemoglobin 11.4; Platelets 155; Potassium 4.2; Sodium 140    Lipid Panel No results found for: CHOL, TRIG, HDL, CHOLHDL, VLDL, LDLCALC, LDLDIRECT    Wt Readings from Last 3 Encounters:  02/16/19 115 lb (52.2 kg)  12/21/18 113 lb (51.3 kg)  07/14/18 120 lb (54.4 kg)      Other studies Reviewed: Additional studies/ records that were reviewed today include: notes from Dr Bronson Ing 2017 TTE notes from primary labs .    ASSESSMENT AND PLAN:  1.  MVR: normal exam no symptoms and echo with normal function 2017 [facility] 2. Anticoagulation INR Rx will arrange f/u INR with Edrick Oh in 4 weeks Last 2.3 01/20/19  3. Chronic Afib ECG today with good rate control INR Rx 4. CVA distant functional ambulates with cane maintain plavix with coumadin 5. HTN: Well controlled.  Continue current medications and low sodium Dash type diet.   6. Thyroid continue replacement TSH normal 10/30/17    Current medicines are reviewed at length with the patient today.  The patient does not have concerns regarding medicines.  The following changes have been made:  no change  Labs/ tests ordered today  include: Refer to coumadin clinic   No orders of the defined types were placed in this encounter.    Disposition:   FU with Dr Bronson Ing in 6 months  Continue  with coumadin clinic     Signed, Jenkins Rouge, MD  02/16/2019 3:12 PM    [facility] [place], [place], [place]  [address] Phone: [phone]; Fax: [phone]

## 2019-02-16 ENCOUNTER — Other Ambulatory Visit: Payer: Self-pay

## 2019-02-16 ENCOUNTER — Ambulatory Visit (INDEPENDENT_AMBULATORY_CARE_PROVIDER_SITE_OTHER): Payer: Medicare Other | Admitting: Cardiovascular Disease

## 2019-02-16 ENCOUNTER — Encounter: Payer: Self-pay | Admitting: Cardiovascular Disease

## 2019-02-16 VITALS — BP 141/75 | HR 59 | Temp 98.6°F | Wt 115.0 lb

## 2019-02-16 DIAGNOSIS — Z952 Presence of prosthetic heart valve: Secondary | ICD-10-CM | POA: Diagnosis not present

## 2019-02-16 NOTE — Patient Instructions (Signed)

## 2019-03-03 ENCOUNTER — Ambulatory Visit (INDEPENDENT_AMBULATORY_CARE_PROVIDER_SITE_OTHER): Payer: Medicare Other | Admitting: *Deleted

## 2019-03-03 ENCOUNTER — Other Ambulatory Visit: Payer: Self-pay

## 2019-03-03 DIAGNOSIS — I639 Cerebral infarction, unspecified: Secondary | ICD-10-CM | POA: Diagnosis not present

## 2019-03-03 DIAGNOSIS — Z5181 Encounter for therapeutic drug level monitoring: Secondary | ICD-10-CM | POA: Diagnosis not present

## 2019-03-03 DIAGNOSIS — Z952 Presence of prosthetic heart valve: Secondary | ICD-10-CM | POA: Diagnosis not present

## 2019-03-03 DIAGNOSIS — I482 Chronic atrial fibrillation, unspecified: Secondary | ICD-10-CM | POA: Diagnosis not present

## 2019-03-03 LAB — POCT INR: INR: 2.1 (ref 2.0–3.0)

## 2019-03-03 NOTE — Patient Instructions (Signed)
Take coumadin 4mg  tonight and tomorrow night then resume 3mg  daily except 4mg  on Wednesdays Recheck in 6 weeks

## 2019-03-31 ENCOUNTER — Other Ambulatory Visit: Payer: Self-pay | Admitting: Cardiovascular Disease

## 2019-04-13 ENCOUNTER — Telehealth (INDEPENDENT_AMBULATORY_CARE_PROVIDER_SITE_OTHER): Payer: Self-pay

## 2019-04-13 ENCOUNTER — Other Ambulatory Visit (INDEPENDENT_AMBULATORY_CARE_PROVIDER_SITE_OTHER): Payer: Self-pay | Admitting: Internal Medicine

## 2019-04-13 DIAGNOSIS — I10 Essential (primary) hypertension: Secondary | ICD-10-CM

## 2019-04-13 MED ORDER — LEVOTHYROXINE SODIUM 100 MCG PO TABS: 100.0000 ug | ORAL_TABLET | Freq: Every day | ORAL | 0 refills | Status: DC

## 2019-04-13 MED ORDER — LISINOPRIL 2.5 MG PO TABS: 2.5000 mg | ORAL_TABLET | Freq: Every day | ORAL | 0 refills | Status: DC

## 2019-04-14 ENCOUNTER — Other Ambulatory Visit: Payer: Self-pay

## 2019-04-14 ENCOUNTER — Ambulatory Visit (INDEPENDENT_AMBULATORY_CARE_PROVIDER_SITE_OTHER): Payer: Medicare Other | Admitting: *Deleted

## 2019-04-14 DIAGNOSIS — I639 Cerebral infarction, unspecified: Secondary | ICD-10-CM | POA: Diagnosis not present

## 2019-04-14 DIAGNOSIS — Z5181 Encounter for therapeutic drug level monitoring: Secondary | ICD-10-CM

## 2019-04-14 DIAGNOSIS — Z952 Presence of prosthetic heart valve: Secondary | ICD-10-CM

## 2019-04-14 LAB — POCT INR: INR: 2.6 (ref 2.0–3.0)

## 2019-04-14 NOTE — Patient Instructions (Signed)
Take coumadin 3mg  tonight then resume 3mg  daily except 4mg  on Wednesdays Took 4mg  tablet last night  Recheck in 6 weeks

## 2019-04-15 NOTE — Telephone Encounter (Signed)
Pt Rx was filled & sent.

## 2019-04-27 ENCOUNTER — Other Ambulatory Visit (INDEPENDENT_AMBULATORY_CARE_PROVIDER_SITE_OTHER): Payer: Self-pay

## 2019-04-27 MED ORDER — WARFARIN SODIUM 2 MG PO TABS: 2.0000 mg | ORAL_TABLET | Freq: Every day | ORAL | 3 refills | Status: DC

## 2019-04-27 MED ORDER — WARFARIN SODIUM 3 MG PO TABS: ORAL_TABLET | ORAL | 2 refills | Status: DC

## 2019-05-08 ENCOUNTER — Other Ambulatory Visit (INDEPENDENT_AMBULATORY_CARE_PROVIDER_SITE_OTHER): Payer: Self-pay | Admitting: Nurse Practitioner

## 2019-05-08 DIAGNOSIS — I482 Chronic atrial fibrillation, unspecified: Secondary | ICD-10-CM

## 2019-05-10 ENCOUNTER — Ambulatory Visit (INDEPENDENT_AMBULATORY_CARE_PROVIDER_SITE_OTHER): Payer: Medicare Other | Admitting: Internal Medicine

## 2019-05-26 ENCOUNTER — Ambulatory Visit (INDEPENDENT_AMBULATORY_CARE_PROVIDER_SITE_OTHER): Payer: Medicare Other | Admitting: *Deleted

## 2019-05-26 ENCOUNTER — Other Ambulatory Visit: Payer: Self-pay

## 2019-05-26 ENCOUNTER — Other Ambulatory Visit (INDEPENDENT_AMBULATORY_CARE_PROVIDER_SITE_OTHER): Payer: Self-pay | Admitting: Internal Medicine

## 2019-05-26 DIAGNOSIS — Z5181 Encounter for therapeutic drug level monitoring: Secondary | ICD-10-CM

## 2019-05-26 DIAGNOSIS — I639 Cerebral infarction, unspecified: Secondary | ICD-10-CM | POA: Diagnosis not present

## 2019-05-26 DIAGNOSIS — Z952 Presence of prosthetic heart valve: Secondary | ICD-10-CM

## 2019-05-26 DIAGNOSIS — I482 Chronic atrial fibrillation, unspecified: Secondary | ICD-10-CM | POA: Diagnosis not present

## 2019-05-26 LAB — POCT INR: INR: 2.4 (ref 2.0–3.0)

## 2019-05-26 NOTE — Patient Instructions (Signed)
Take coumadin 4mg tonight and tomorrow night then resume 3mg daily except 4mg on Wednesdays Recheck in 6 weeks 

## 2019-06-18 ENCOUNTER — Telehealth (INDEPENDENT_AMBULATORY_CARE_PROVIDER_SITE_OTHER): Payer: Self-pay

## 2019-06-18 ENCOUNTER — Other Ambulatory Visit (INDEPENDENT_AMBULATORY_CARE_PROVIDER_SITE_OTHER): Payer: Self-pay

## 2019-06-18 DIAGNOSIS — I482 Chronic atrial fibrillation, unspecified: Secondary | ICD-10-CM

## 2019-06-18 NOTE — Progress Notes (Signed)
Son called ;lvm for a refill on medication.

## 2019-06-20 MED ORDER — CLOPIDOGREL BISULFATE 75 MG PO TABS: 75.0000 mg | ORAL_TABLET | Freq: Every day | ORAL | 0 refills | Status: DC

## 2019-06-21 NOTE — Telephone Encounter (Signed)
COMPLETED

## 2019-07-06 ENCOUNTER — Telehealth (INDEPENDENT_AMBULATORY_CARE_PROVIDER_SITE_OTHER): Payer: Self-pay | Admitting: Internal Medicine

## 2019-07-06 ENCOUNTER — Other Ambulatory Visit (INDEPENDENT_AMBULATORY_CARE_PROVIDER_SITE_OTHER): Payer: Self-pay | Admitting: Internal Medicine

## 2019-07-06 DIAGNOSIS — I10 Essential (primary) hypertension: Secondary | ICD-10-CM

## 2019-07-06 MED ORDER — AMLODIPINE BESYLATE 2.5 MG PO TABS: 2.5000 mg | ORAL_TABLET | Freq: Every day | ORAL | 0 refills | Status: DC

## 2019-07-06 MED ORDER — LEVOTHYROXINE SODIUM 100 MCG PO TABS: 100.0000 ug | ORAL_TABLET | Freq: Every day | ORAL | 0 refills | Status: DC

## 2019-07-06 MED ORDER — LISINOPRIL 2.5 MG PO TABS: 2.5000 mg | ORAL_TABLET | Freq: Every day | ORAL | 0 refills | Status: DC

## 2019-07-07 ENCOUNTER — Other Ambulatory Visit: Payer: Self-pay

## 2019-07-07 ENCOUNTER — Ambulatory Visit (INDEPENDENT_AMBULATORY_CARE_PROVIDER_SITE_OTHER): Payer: Medicare Other | Admitting: *Deleted

## 2019-07-07 DIAGNOSIS — I639 Cerebral infarction, unspecified: Secondary | ICD-10-CM | POA: Diagnosis not present

## 2019-07-07 DIAGNOSIS — Z952 Presence of prosthetic heart valve: Secondary | ICD-10-CM | POA: Diagnosis not present

## 2019-07-07 DIAGNOSIS — Z5181 Encounter for therapeutic drug level monitoring: Secondary | ICD-10-CM | POA: Diagnosis not present

## 2019-07-07 LAB — POCT INR: INR: 2.9 (ref 2.0–3.0)

## 2019-07-07 NOTE — Patient Instructions (Signed)
Continue warfarin 3mg  daily except 4mg  on Wednesdays Recheck in 6 weeks

## 2019-07-07 NOTE — Telephone Encounter (Signed)
Done

## 2019-07-12 ENCOUNTER — Encounter (INDEPENDENT_AMBULATORY_CARE_PROVIDER_SITE_OTHER): Payer: Self-pay | Admitting: Nurse Practitioner

## 2019-07-12 ENCOUNTER — Telehealth (INDEPENDENT_AMBULATORY_CARE_PROVIDER_SITE_OTHER): Payer: Medicare Other | Admitting: Nurse Practitioner

## 2019-07-12 DIAGNOSIS — M8000XS Age-related osteoporosis with current pathological fracture, unspecified site, sequela: Secondary | ICD-10-CM | POA: Diagnosis not present

## 2019-07-12 DIAGNOSIS — F0150 Vascular dementia without behavioral disturbance: Secondary | ICD-10-CM

## 2019-07-12 DIAGNOSIS — I10 Essential (primary) hypertension: Secondary | ICD-10-CM | POA: Diagnosis not present

## 2019-07-12 DIAGNOSIS — E039 Hypothyroidism, unspecified: Secondary | ICD-10-CM

## 2019-07-12 NOTE — Progress Notes (Signed)
Subjective:  Patient ID: Courtney [name], female    DOB: 1929-02-06  Age: 85 y.o. MRN: [id number]  Due to national recommendations of social distancing related to the [place] pandemic, an audio-only tele-health visit was felt to be the most appropriate encounter type for this patient today. I connected with Felicite Nepomuceno (patient's son who completed visit on patient's behalf due to patient's progressive dementia) on 07/12/19 utilizing audio-only technology and verified that I am speaking about the correct person using two identifiers. The patient was located at their home, and I was located at the office of [facility] during the encounter. I discussed the limitations of evaluation and management by telemedicine. The patient expressed understanding and agreed to proceed.     CC:  Chief Complaint  Patient presents with  . Follow-up      HPI  This visit was conducted remotely, and was to check in regarding patient's chronic conditions.  Dementia: Appears to be stable at this time.  Patient has had reduced appetite ever since her last fall and arm fracture.  The patient's weight is not checked at home regularly due to physical and mental limitations, however the son believes the patient appears to be fairly stable weight wise.  Hypertension: Patient continues on lisinopril 2.5 mg, amlodipine 2.5 mg, and atenolol 12.5 mg daily.  Historically blood pressures have been stable and well-controlled.  Blood pressure was attempted to be taken today using patient's at home cuff, however the cuff was not functioning properly at the time of the visit.  Hypothyroidism: Patient continues on levothyroxine 100 mcg daily.  Last blood work was collected in June 2020 and showed thyroid panel at goal.  Osteoporosis: Patient continues on Fosamax 70 mg weekly.  Past Medical History:  Diagnosis Date  . Biatrial enlargement 12/01/2015  . Chronic anticoagulation    coumadin, CHADS2VASC=6  . History of  mitral valve replacement   . Hypertension   . Hypothyroidism   . Osteoporosis    on Boniva  . Permanent atrial fibrillation ([place])   . Pulmonary hypertension, moderate to severe ([place]) 12/01/2015  . Stroke [facility])    on Clopidrigel  . UTI (lower urinary tract infection) 07/2015   n&v; AKI  . Vascular dementia ([place])       Family History  Problem Relation Age of Onset  . Heart disease Mother   . Heart disease Father   . Suicidality Son   . Stroke Neg Hx   . Cancer Neg Hx   . Diabetes Neg Hx     Social History   Social History Narrative   Lives with son when in [place]. Has a house in [place], [place]. Husband died.    Had a son that committed suicide. Has dementia.    Social History   Tobacco Use  . Smoking status: Former Smoker    Types: Cigarettes  . Smokeless tobacco: Never Used  Substance Use Topics  . Alcohol use: No     Current Meds  Medication Sig  . acetaminophen (TYLENOL) 500 MG tablet Take 500 mg by mouth every 6 (six) hours as needed for mild pain.  Marland Kitchen alendronate (FOSAMAX) 70 MG tablet once a week.   Marland Kitchen amLODipine (NORVASC) 2.5 MG tablet Take 1 tablet (2.5 mg total) by mouth daily.  Marland Kitchen atenolol (TENORMIN) 25 MG tablet TAKE 1.5 TABLET DAILY BY MOUTH (Patient taking differently: 12.5 mg. )  . b complex vitamins tablet Take 1 tablet by mouth daily.  . clopidogrel (PLAVIX)  75 MG tablet Take 1 tablet (75 mg total) by mouth daily.  Marland Kitchen levothyroxine (SYNTHROID) 100 MCG tablet Take 1 tablet (100 mcg total) by mouth daily before breakfast.  . lisinopril (ZESTRIL) 2.5 MG tablet Take 1 tablet (2.5 mg total) by mouth daily.  . vitamin C (ASCORBIC ACID) 250 MG tablet Take 250 mg by mouth daily.   Marland Kitchen VITAMIN D, ERGOCALCIFEROL, PO Take 250 mg by mouth. Three times a week  . warfarin (COUMADIN) 2 MG tablet Take 1 tablet (2 mg total) by mouth daily.  Marland Kitchen warfarin (COUMADIN) 3 MG tablet Take 3 mg once a week and a total dose of 4 mg all other days of the week.    ROS:  Negative  for all systems currently.   Objective:   Today's Vitals: There were no vitals taken for this visit.  Office visit was conducted remotely, and blood pressure cuff was not working properly at home.  Thus, vital signs were not taken today. Vitals with BMI 07/12/2019 02/16/2019 12/21/2018  Height - - 5\' 2"   Weight (No Data) 115 lbs 113 lbs  BMI - - [id number]  Systolic - [id number]  Diastolic - 75 72  Pulse - 59 70     Physical Exam Comprehensive physical exam not completed today as office visit was conducted remotely.  Per patient's son, patient appears to be stable.       Assessment   No diagnosis found.    Tests ordered No orders of the defined types were placed in this encounter.    Plan: 1.  Dementia: No new treatment modalities recommended for today.  I encouraged the son to continue monitoring patient and to let us know if he has any concerns including issues with weight loss between now and her next appointment.  He tells me he understands.  2.  Hypertension: She will continue on her current medication regimen at this time.  I encouraged him to check her blood pressure periodically with their at home blood pressure cuff if it starts to work.  He tells me he understands.  3.  Hypothyroidism: She will continue on her current treatment regimen at this time.  We will consider collecting thyroid panel at her next office visit for further evaluation.  4.  Osteoporosis: She will continue on Fosamax weekly.   No orders of the defined types were placed in this encounter.  This telephone encounter lasted for 19 minutes and 25 seconds.  Patient to follow-up in 3 months.  Ailene Ards, NP

## 2019-07-22 ENCOUNTER — Other Ambulatory Visit (INDEPENDENT_AMBULATORY_CARE_PROVIDER_SITE_OTHER): Payer: Self-pay | Admitting: Internal Medicine

## 2019-07-28 ENCOUNTER — Ambulatory Visit (INDEPENDENT_AMBULATORY_CARE_PROVIDER_SITE_OTHER): Payer: Medicare Other

## 2019-07-28 ENCOUNTER — Telehealth (INDEPENDENT_AMBULATORY_CARE_PROVIDER_SITE_OTHER): Payer: Self-pay

## 2019-07-28 ENCOUNTER — Other Ambulatory Visit: Payer: Self-pay

## 2019-07-28 DIAGNOSIS — Z23 Encounter for immunization: Secondary | ICD-10-CM | POA: Diagnosis not present

## 2019-08-02 NOTE — Telephone Encounter (Signed)
Done

## 2019-08-18 ENCOUNTER — Ambulatory Visit (INDEPENDENT_AMBULATORY_CARE_PROVIDER_SITE_OTHER): Payer: Medicare PPO | Admitting: *Deleted

## 2019-08-18 DIAGNOSIS — Z5181 Encounter for therapeutic drug level monitoring: Secondary | ICD-10-CM | POA: Diagnosis not present

## 2019-08-18 DIAGNOSIS — Z952 Presence of prosthetic heart valve: Secondary | ICD-10-CM

## 2019-08-18 LAB — POCT INR: INR: 2.2 (ref 2.0–3.0)

## 2019-08-18 NOTE — Patient Instructions (Signed)
Take 6mg  tonight then resume 3mg  daily except 4mg  on Wednesdays Recheck in 6 weeks

## 2019-09-01 ENCOUNTER — Other Ambulatory Visit (INDEPENDENT_AMBULATORY_CARE_PROVIDER_SITE_OTHER): Payer: Self-pay | Admitting: Nurse Practitioner

## 2019-09-28 ENCOUNTER — Other Ambulatory Visit (INDEPENDENT_AMBULATORY_CARE_PROVIDER_SITE_OTHER): Payer: Self-pay | Admitting: Internal Medicine

## 2019-09-28 DIAGNOSIS — I10 Essential (primary) hypertension: Secondary | ICD-10-CM

## 2019-09-29 ENCOUNTER — Other Ambulatory Visit: Payer: Self-pay

## 2019-09-29 ENCOUNTER — Ambulatory Visit (INDEPENDENT_AMBULATORY_CARE_PROVIDER_SITE_OTHER): Payer: Medicare PPO | Admitting: *Deleted

## 2019-09-29 DIAGNOSIS — Z5181 Encounter for therapeutic drug level monitoring: Secondary | ICD-10-CM | POA: Diagnosis not present

## 2019-09-29 DIAGNOSIS — Z952 Presence of prosthetic heart valve: Secondary | ICD-10-CM

## 2019-09-29 DIAGNOSIS — I639 Cerebral infarction, unspecified: Secondary | ICD-10-CM | POA: Diagnosis not present

## 2019-09-29 LAB — POCT INR: INR: 2.1 (ref 2.0–3.0)

## 2019-09-29 NOTE — Patient Instructions (Signed)
Take 7mg  tonight then resume 3mg  daily except 4mg  on Wednesdays Recheck in 6 weeks

## 2019-10-14 ENCOUNTER — Other Ambulatory Visit (INDEPENDENT_AMBULATORY_CARE_PROVIDER_SITE_OTHER): Payer: Self-pay | Admitting: Internal Medicine

## 2019-10-14 DIAGNOSIS — I482 Chronic atrial fibrillation, unspecified: Secondary | ICD-10-CM

## 2019-11-02 ENCOUNTER — Other Ambulatory Visit: Payer: Self-pay

## 2019-11-02 ENCOUNTER — Encounter (INDEPENDENT_AMBULATORY_CARE_PROVIDER_SITE_OTHER): Payer: Self-pay | Admitting: Nurse Practitioner

## 2019-11-02 ENCOUNTER — Ambulatory Visit (INDEPENDENT_AMBULATORY_CARE_PROVIDER_SITE_OTHER): Payer: Medicare PPO | Admitting: Nurse Practitioner

## 2019-11-02 VITALS — BP 115/70 | HR 59 | Temp 97.7°F | Ht 63.0 in | Wt 106.8 lb

## 2019-11-02 DIAGNOSIS — E559 Vitamin D deficiency, unspecified: Secondary | ICD-10-CM | POA: Diagnosis not present

## 2019-11-02 DIAGNOSIS — I10 Essential (primary) hypertension: Secondary | ICD-10-CM

## 2019-11-02 DIAGNOSIS — S91001A Unspecified open wound, right ankle, initial encounter: Secondary | ICD-10-CM | POA: Diagnosis not present

## 2019-11-02 DIAGNOSIS — E039 Hypothyroidism, unspecified: Secondary | ICD-10-CM | POA: Diagnosis not present

## 2019-11-02 LAB — COMPLETE METABOLIC PANEL WITH GFR
AG Ratio: 1.9 (calc) (ref 1.0–2.5)
ALT: 13 U/L (ref 6–29)
AST: 21 U/L (ref 10–35)
Albumin: 4.4 g/dL (ref 3.6–5.1)
Alkaline phosphatase (APISO): 75 U/L (ref 37–153)
BUN: 25 mg/dL (ref 7–25)
CO2: 29 mmol/L (ref 20–32)
Calcium: 10.4 mg/dL (ref 8.6–10.4)
Chloride: 107 mmol/L (ref 98–110)
Creat: 0.82 mg/dL (ref 0.60–0.88)
GFR, Est African American: 74 mL/min/{1.73_m2} (ref >=60)
GFR, Est Non African American: 63 mL/min/{1.73_m2} (ref >=60)
Globulin: 2.3 g/dL (calc) (ref 1.9–3.7)
Glucose, Bld: 90 mg/dL (ref 65–99)
Potassium: 4.7 mmol/L (ref 3.5–5.3)
Sodium: 144 mmol/L (ref 135–146)
Total Bilirubin: 0.9 mg/dL (ref 0.2–1.2)
Total Protein: 6.7 g/dL (ref 6.1–8.1)

## 2019-11-02 LAB — VITAMIN D 25 HYDROXY (VIT D DEFICIENCY, FRACTURES): Vit D, 25-Hydroxy: 19 ng/mL — ABNORMAL LOW (ref 30–100)

## 2019-11-02 LAB — TSH: TSH: 1.23 mIU/L (ref 0.40–4.50)

## 2019-11-02 MED ORDER — DOXYCYCLINE HYCLATE 100 MG PO TABS: 100.0000 mg | ORAL_TABLET | Freq: Two times a day (BID) | ORAL | 0 refills | Status: DC

## 2019-11-02 NOTE — Patient Instructions (Addendum)
Stop the patient's Amlodipine (Norvasc). You may check her blood pressure at home periodically, if you see her blood pressure is greater than or equal to 140/90 on 2 separate occasions please let our office know.  Monitor the wound to her right leg and continue changing the dressing as well as using the Betadine topically.  If you notice redness, swelling, worsening tenderness, heat, increased discharge especially if it is green or yellow at the site please start her on doxycycline.  She will take 1 tablet by mouth twice a day for 10 days.  If you need to start her on doxycycline then call her cardiologist so they can be aware they may need to monitor her INR more frequently while on doxycycline.  I will check blood work today, and will notify you either by letter or telephone call if any further changes to her medication should be made.  We will have her follow-up in about 1 month to closely monitor her blood pressure.  Please call with any questions.

## 2019-11-02 NOTE — Progress Notes (Signed)
Subjective:  Patient ID: Courtney [name], female    DOB: [birth year]  Age: 86 y.o. MRN: [id number]  CC:  Chief Complaint  Patient presents with  . Hypertension  . Hypothyroidism  . Other    wound to right lower extremity  . Other    Vitamin D deficiency      HPI  This patient comes in today accompanied by her son for the above.  Hypertension: She is a history of elevated blood pressure.  She is currently on lisinopril 2.5 mg daily, amlodipine 2 5 5  mg daily, and atenolol 12.5 mg daily.  She does have a history of atrial fibrillation and continues on anticoagulation.  Son is wondering if we can titrate off of some antihypertensives today.  Hypothyroidism: She is currently on levothyroxine 100 mcg daily.  She is due for thyroid panel checked today.  Wound to right lower extremity: Her son tells me approximately 3 weeks ago the patient hit her leg on some furniture, and a small wound developed.  The patient has been picking at the wound on and off since then, which has made the wound larger.  The son has found a leg sleeve to help cover the wound which has resulted in the patient not picking at it as frequently.  The son has been changing and dressing the wound on a daily basis as well as applying Betadine topically to the wound.  He is concerned that there may be an infection starting.  Vitamin D deficiency: She continues on vitamin D 3 supplementation.  She takes her vitamin D 3 times a week and she is also on Fosamax for treatment of osteoporosis.  She is due for serum check today.   Past Medical History:  Diagnosis Date  . Biatrial enlargement 12/01/2015  . Chronic anticoagulation    coumadin, CHADS2VASC=6  . History of mitral valve replacement   . Hypertension   . Hypothyroidism   . Osteoporosis    on Boniva  . Permanent atrial fibrillation ([place])   . Pulmonary hypertension, moderate to severe ([place]) 12/01/2015  . Stroke [facility])    on Clopidrigel  . UTI (lower urinary tract  infection) 07/2015   n&v; AKI  . Vascular dementia ([place])       Family History  Problem Relation Age of Onset  . Heart disease Mother   . Heart disease Father   . Suicidality Son   . Stroke Neg Hx   . Cancer Neg Hx   . Diabetes Neg Hx     Social History   Social History Narrative   Lives with son when in [place]. Has a house in [place], [place]. Husband died.    Had a son that committed suicide. Has dementia.    Social History   Tobacco Use  . Smoking status: Former Smoker    Types: Cigarettes  . Smokeless tobacco: Never Used  Substance Use Topics  . Alcohol use: No     Current Meds  Medication Sig  . acetaminophen (TYLENOL) 500 MG tablet Take 500 mg by mouth every 6 (six) hours as needed for mild pain.  Marland Kitchen alendronate (FOSAMAX) 70 MG tablet TAKE 1 TABLET BY MOUTH EVERY WEDNESDAY  . atenolol (TENORMIN) 25 MG tablet Take 12.5 mg by mouth daily.  Marland Kitchen b complex vitamins tablet Take 1 tablet by mouth daily.  . clopidogrel (PLAVIX) 75 MG tablet TAKE 1 TABLET BY MOUTH EVERY DAY  . levothyroxine (SYNTHROID) 100 MCG tablet Take 1 tablet (100 mcg  total) by mouth daily before breakfast.  . lisinopril (ZESTRIL) 2.5 MG tablet Take 1 tablet (2.5 mg total) by mouth daily.  . vitamin C (ASCORBIC ACID) 250 MG tablet Take 250 mg by mouth daily.   Marland Kitchen VITAMIN D, ERGOCALCIFEROL, PO Take 250 mg by mouth. Three times a week  . warfarin (COUMADIN) 2 MG tablet TAKE 1 TABLET BY MOUTH EVERY DAY  . warfarin (COUMADIN) 3 MG tablet Take 3 mg once a week and a total dose of 4 mg all other days of the week.  . [DISCONTINUED] amLODipine (NORVASC) 2.5 MG tablet TAKE 1 TABLET BY MOUTH EVERY DAY    ROS:  Review of systems limited due to patient's dementia.  However unless otherwise stated in HPI all systems reviewed and negative.   Objective:   Today's Vitals: BP 115/70 (BP Location: Left Arm, Patient Position: Sitting, Cuff Size: Normal)   Pulse (!) 59   Temp 97.7 F (36.5 C) (Temporal)   Ht 5' 3"   (1.6 m)   Wt 106 lb 12.8 oz (48.4 kg)   SpO2 97%   BMI 18.92 kg/m  Vitals with BMI 11/02/2019 07/12/2019 02/16/2019  Height 5' 3"  - -  Weight 106 lbs 13 oz (No Data) 115 lbs  BMI 54.09 - -  Systolic 811 - 914  Diastolic 70 - 75  Pulse 59 - 59     Physical Exam Vitals reviewed.  Constitutional:      General: She is not in acute distress.    Appearance: Normal appearance.  HENT:     Head: Normocephalic and atraumatic.  Neck:     Vascular: No carotid bruit.  Cardiovascular:     Rate and Rhythm: Normal rate and regular rhythm.     Pulses: Normal pulses.     Heart sounds: Normal heart sounds.  Pulmonary:     Effort: Pulmonary effort is normal.     Breath sounds: Normal breath sounds.  Skin:    General: Skin is warm and dry.       Neurological:     General: No focal deficit present.     Mental Status: She is alert and oriented to person, place, and time.  Psychiatric:        Mood and Affect: Mood normal.        Behavior: Behavior normal.        Judgment: Judgment normal.          Assessment and Plan   1. Wound of right ankle, initial encounter   2. Essential hypertension   3. Hypothyroidism, unspecified type   4. Vitamin D deficiency      Plan: 1.  Wound actually appears well managed I do not think is acutely infected currently.  I encouraged the son to continue to change the dressing daily and use the topical Betadine.  I have prescribed doxycycline that he can start the patient on if he notices the wound worsens or if the area starts to become more edematous, red, tender, feels warm to touch, or drains purulent drainage.  He tells me he understands.  I do note that doxycycline and Coumadin to have an interaction, and thus I have told him that if he decides to start the patient on the doxycycline based on worsening symptoms that we discussed in the office today, but he should call their cardiologist and notify them of this, as they may want to check the patient's  INR a bit more frequently while she is on the doxycycline.  He tells me that he understands.  2.  Her blood pressure is very well controlled, due to her age and her frailty she is at high risk for complications from falls.  Thus I am agreeable to try reducing her antihypertensive.  We will start by stopping her amlodipine.  I think she will most likely have to remain on atenolol due to her history of atrial fibrillation is this is probably helping to control her heart rate.  She will follow-up in approximately 1 month for blood pressure check, if blood pressure still remains on the lower end of normal may need to consider trying to stop her lisinopril.  3.  We will check thyroid panel today for further evaluation.  4.  We will check serum vitamin D level today for further evaluation.   Tests ordered Orders Placed This Encounter  Procedures  . CMP with eGFR([facility])  . Vitamin D, 25-hydroxy  . TSH      Meds ordered this encounter  Medications  . doxycycline (VIBRA-TABS) 100 MG tablet    Sig: Take 1 tablet (100 mg total) by mouth 2 (two) times daily.    Dispense:  20 tablet    Refill:  0    Order Specific Question:   Supervising Provider    Answer:   Doree Albee [[id number]]    Patient to follow-up in 1 month  [place], NP

## 2019-11-10 ENCOUNTER — Ambulatory Visit (INDEPENDENT_AMBULATORY_CARE_PROVIDER_SITE_OTHER): Payer: Medicare PPO | Admitting: *Deleted

## 2019-11-10 DIAGNOSIS — I639 Cerebral infarction, unspecified: Secondary | ICD-10-CM

## 2019-11-10 DIAGNOSIS — Z5181 Encounter for therapeutic drug level monitoring: Secondary | ICD-10-CM

## 2019-11-10 DIAGNOSIS — Z952 Presence of prosthetic heart valve: Secondary | ICD-10-CM

## 2019-11-10 LAB — POCT INR: INR: 1.7 — AB (ref 2.0–3.0)

## 2019-11-10 NOTE — Patient Instructions (Signed)
Take 6mg  tonight and tomorrow night then resume 3mg  daily except 4mg  on Wednesdays Recheck in 5 weeks

## 2019-12-07 ENCOUNTER — Ambulatory Visit (INDEPENDENT_AMBULATORY_CARE_PROVIDER_SITE_OTHER): Payer: Medicare PPO | Admitting: Nurse Practitioner

## 2019-12-07 ENCOUNTER — Other Ambulatory Visit: Payer: Self-pay

## 2019-12-07 ENCOUNTER — Encounter (INDEPENDENT_AMBULATORY_CARE_PROVIDER_SITE_OTHER): Payer: Self-pay | Admitting: Nurse Practitioner

## 2019-12-07 VITALS — BP 120/80 | HR 57 | Temp 98.3°F | Ht 63.0 in | Wt 110.0 lb

## 2019-12-07 DIAGNOSIS — I10 Essential (primary) hypertension: Secondary | ICD-10-CM | POA: Diagnosis not present

## 2019-12-07 NOTE — Progress Notes (Signed)
Subjective:  Patient ID: Courtney [name], female    DOB: [birth year]  Age: 86 y.o. MRN: [id number]  CC:  Chief Complaint  Patient presents with  . Follow-up  . Hypertension      HPI  This patient arrives today for the above accompanied by her son.   At her last office visit we did discuss try to slowly reduce all of her medications as she has advanced dementia, and reduction in risks associated with polypharmacy will probably be a good idea.  At that office visit we decided to stop her amlodipine, and since that visit her son tells me she has not been complaining about any concerns such as headache, vision changes, chest pain, difficulties with breathing, etc.   Past Medical History:  Diagnosis Date  . Biatrial enlargement 12/01/2015  . Chronic anticoagulation    coumadin, CHADS2VASC=6  . History of mitral valve replacement   . Hypertension   . Hypothyroidism   . Osteoporosis    on Boniva  . Permanent atrial fibrillation ([place])   . Pulmonary hypertension, moderate to severe ([place]) 12/01/2015  . Stroke [facility])    on Clopidrigel  . UTI (lower urinary tract infection) 07/2015   n&v; AKI  . Vascular dementia ([place])       Family History  Problem Relation Age of Onset  . Heart disease Mother   . Heart disease Father   . Suicidality Son   . Stroke Neg Hx   . Cancer Neg Hx   . Diabetes Neg Hx     Social History   Social History Narrative   Lives with son when in [place]. Has a house in [place], [place]. Husband died.    Had a son that committed suicide. Has dementia.    Social History   Tobacco Use  . Smoking status: Former Smoker    Types: Cigarettes  . Smokeless tobacco: Never Used  Substance Use Topics  . Alcohol use: No     Current Meds  Medication Sig  . acetaminophen (TYLENOL) 500 MG tablet Take 500 mg by mouth every 6 (six) hours as needed for mild pain.  Marland Kitchen alendronate (FOSAMAX) 70 MG tablet TAKE 1 TABLET BY MOUTH EVERY WEDNESDAY  . atenolol (TENORMIN) 25 MG  tablet Take 12.5 mg by mouth daily.  Marland Kitchen b complex vitamins tablet Take 1 tablet by mouth daily.  . clopidogrel (PLAVIX) 75 MG tablet TAKE 1 TABLET BY MOUTH EVERY DAY  . doxycycline (VIBRA-TABS) 100 MG tablet Take 1 tablet (100 mg total) by mouth 2 (two) times daily.  Marland Kitchen levothyroxine (SYNTHROID) 100 MCG tablet Take 1 tablet (100 mcg total) by mouth daily before breakfast.  . vitamin C (ASCORBIC ACID) 250 MG tablet Take 250 mg by mouth daily.   Marland Kitchen VITAMIN D, ERGOCALCIFEROL, PO Take 250 mg by mouth. Three times a week  . warfarin (COUMADIN) 2 MG tablet TAKE 1 TABLET BY MOUTH EVERY DAY  . warfarin (COUMADIN) 3 MG tablet Take 3 mg once a week and a total dose of 4 mg all other days of the week.  . [DISCONTINUED] lisinopril (ZESTRIL) 2.5 MG tablet Take 1 tablet (2.5 mg total) by mouth daily.    ROS:  Negative unless otherwise stated in HPI   Objective:   Today's Vitals: BP 120/80 (BP Location: Left Arm, Patient Position: Sitting, Cuff Size: Normal)   Pulse (!) 57   Temp 98.3 F (36.8 C) (Temporal)   Ht 5\' 3"  (1.6 m)   Wt 110  lb (49.9 kg)   SpO2 98%   BMI 19.49 kg/m  Vitals with BMI 12/07/2019 11/02/2019 07/12/2019  Height 5\' 3"  5\' 3"  -  Weight 110 lbs 106 lbs 13 oz (No Data)  BMI 123456 [id number] -  Systolic 123456 [id number] -  Diastolic 80 70 -  Pulse 57 59 -     Physical Exam Vitals reviewed.  Constitutional:      General: She is not in acute distress.    Appearance: Normal appearance.  HENT:     Head: Normocephalic and atraumatic.  Neck:     Vascular: No carotid bruit.  Cardiovascular:     Rate and Rhythm: Normal rate and regular rhythm.     Pulses: Normal pulses.     Heart sounds: Normal heart sounds.  Pulmonary:     Effort: Pulmonary effort is normal.     Breath sounds: Normal breath sounds.  Skin:    General: Skin is warm and dry.  Neurological:     Mental Status: She is alert. Mental status is at baseline.  Psychiatric:        Mood and Affect: Mood normal.        Behavior:  Behavior normal.          Assessment and Plan   1. Essential hypertension      Plan: 1.  Blood pressure remained stable despite stopping the patient's amlodipine.  Per review of medications we did discuss possibly stopping the patient's lisinopril as well.  The son is agreeable to this.  Thus patient will stop lisinopril, and return to office in 2 weeks for blood pressure check via nurses visit.  She will follow-up again in 3 months for office visit.  Son was encouraged to call us with any questions or concerns between now and the next appointment.   Tests ordered No orders of the defined types were placed in this encounter.     No orders of the defined types were placed in this encounter.   Patient to follow-up in 2 weeks for nurse visit and then again in 3 months for office visit  Ailene Ards, NP

## 2019-12-21 ENCOUNTER — Telehealth (INDEPENDENT_AMBULATORY_CARE_PROVIDER_SITE_OTHER): Payer: Self-pay

## 2019-12-21 ENCOUNTER — Telehealth (INDEPENDENT_AMBULATORY_CARE_PROVIDER_SITE_OTHER): Payer: Self-pay | Admitting: Internal Medicine

## 2019-12-21 ENCOUNTER — Ambulatory Visit (INDEPENDENT_AMBULATORY_CARE_PROVIDER_SITE_OTHER): Payer: Medicare PPO

## 2019-12-21 NOTE — Telephone Encounter (Signed)
Left a detailed voicemail for [employer]

## 2019-12-21 NOTE — Telephone Encounter (Signed)
That will be fine. 

## 2019-12-21 NOTE — Telephone Encounter (Signed)
Waunita Schooner is calling on behalf of his mother Courtney [name] she is scheduled today for a 2:00 nurse visit for a Blood Pressure Check and he is asking if he can just do this at home and call with results? Sarah had discontinued the Amlodipine and Waunita Schooner is stating that his mother got "Out of Sorts" so he restarted the Amlodipine, Please advise?

## 2019-12-23 ENCOUNTER — Other Ambulatory Visit (INDEPENDENT_AMBULATORY_CARE_PROVIDER_SITE_OTHER): Payer: Self-pay | Admitting: Internal Medicine

## 2019-12-23 DIAGNOSIS — I10 Essential (primary) hypertension: Secondary | ICD-10-CM

## 2019-12-23 NOTE — Telephone Encounter (Signed)
Done

## 2020-01-04 ENCOUNTER — Other Ambulatory Visit: Payer: Self-pay

## 2020-01-04 ENCOUNTER — Ambulatory Visit (INDEPENDENT_AMBULATORY_CARE_PROVIDER_SITE_OTHER): Payer: Medicare PPO | Admitting: *Deleted

## 2020-01-04 DIAGNOSIS — Z5181 Encounter for therapeutic drug level monitoring: Secondary | ICD-10-CM | POA: Diagnosis not present

## 2020-01-04 DIAGNOSIS — Z952 Presence of prosthetic heart valve: Secondary | ICD-10-CM

## 2020-01-04 LAB — POCT INR: INR: 2.3 (ref 2.0–3.0)

## 2020-01-04 NOTE — Patient Instructions (Signed)
Take 6mg  tonight then resume 3mg  daily except 4mg  on Wednesdays Recheck in 6 weeks

## 2020-01-08 ENCOUNTER — Other Ambulatory Visit (INDEPENDENT_AMBULATORY_CARE_PROVIDER_SITE_OTHER): Payer: Self-pay | Admitting: Internal Medicine

## 2020-01-08 DIAGNOSIS — I482 Chronic atrial fibrillation, unspecified: Secondary | ICD-10-CM

## 2020-01-10 ENCOUNTER — Other Ambulatory Visit (INDEPENDENT_AMBULATORY_CARE_PROVIDER_SITE_OTHER): Payer: Self-pay

## 2020-01-10 ENCOUNTER — Other Ambulatory Visit (INDEPENDENT_AMBULATORY_CARE_PROVIDER_SITE_OTHER): Payer: Self-pay | Admitting: Nurse Practitioner

## 2020-01-10 MED ORDER — LISINOPRIL 2.5 MG PO TABS
2.5000 mg | ORAL_TABLET | Freq: Every day | ORAL | 0 refills | Status: DC
Start: 2020-01-10 — End: 2020-01-11

## 2020-01-10 NOTE — Telephone Encounter (Signed)
Refill sent.

## 2020-01-11 MED ORDER — LISINOPRIL 2.5 MG PO TABS: 2.5000 mg | ORAL_TABLET | Freq: Every day | ORAL | 0 refills | Status: DC

## 2020-01-11 NOTE — Addendum Note (Signed)
Addended by: Laurence Compton on: 01/11/2020 01:52 PM   Modules accepted: Orders

## 2020-01-18 ENCOUNTER — Other Ambulatory Visit (INDEPENDENT_AMBULATORY_CARE_PROVIDER_SITE_OTHER): Payer: Self-pay | Admitting: Internal Medicine

## 2020-01-20 ENCOUNTER — Other Ambulatory Visit: Payer: Self-pay

## 2020-01-20 ENCOUNTER — Ambulatory Visit (INDEPENDENT_AMBULATORY_CARE_PROVIDER_SITE_OTHER): Payer: Medicare PPO | Admitting: Cardiovascular Disease

## 2020-01-20 ENCOUNTER — Encounter: Payer: Self-pay | Admitting: Cardiovascular Disease

## 2020-01-20 VITALS — BP 132/60 | HR 60 | Ht 63.0 in | Wt 110.8 lb

## 2020-01-20 DIAGNOSIS — I482 Chronic atrial fibrillation, unspecified: Secondary | ICD-10-CM | POA: Diagnosis not present

## 2020-01-20 DIAGNOSIS — I10 Essential (primary) hypertension: Secondary | ICD-10-CM

## 2020-01-20 MED ORDER — AMLODIPINE BESYLATE 2.5 MG PO TABS: 2.5000 mg | ORAL_TABLET | Freq: Every day | ORAL | 3 refills | Status: DC

## 2020-01-20 MED ORDER — WARFARIN SODIUM 3 MG PO TABS: ORAL_TABLET | ORAL | 2 refills | Status: DC

## 2020-01-20 NOTE — Patient Instructions (Signed)
Medication Instructions:  Your physician recommends that you continue on your current medications as directed. Please refer to the Current Medication list given to you today.  *If you need a refill on your cardiac medications before your next appointment, please call your pharmacy*   Lab Work: None todauy If you have labs (blood work) drawn today and your tests are completely normal, you will receive your results only by: Marland Kitchen MyChart Message (if you have MyChart) OR . A paper copy in the mail If you have any lab test that is abnormal or we need to change your treatment, we will call you to review the results.   Testing/Procedures: None today   Follow-Up: At [facility], you and your health needs are our priority.  As part of our continuing mission to provide you with exceptional heart care, we have created designated Provider Care Teams.  These Care Teams include your primary Cardiologist (physician) and Advanced Practice Providers (APPs -  Physician Assistants and Nurse Practitioners) who all work together to provide you with the care you need, when you need it.  We recommend signing up for the patient portal called "MyChart".  Sign up information is provided on this After Visit Summary.  MyChart is used to connect with patients for Virtual Visits (Telemedicine).  Patients are able to view lab/test results, encounter notes, upcoming appointments, etc.  Non-urgent messages can be sent to your provider as well.   To learn more about what you can do with MyChart, go to [email or web address].    Your next appointment:   6 month(s)  The format for your next appointment:   In Person  Provider:   Jenkins Rouge, MD   Other Instructions None      Thank you for choosing [facility] !

## 2020-01-20 NOTE — Progress Notes (Signed)
Cardiology Office Note   Date:  01/20/2020   ID:  Courtney [name], DOB [birth year], MRN [id number]  PCP:  Doree Albee, MD  Cardiologist:   Kate Sable   No chief complaint on file.     History of Present Illness: Courtney [name] is a 86 y.o. female who presents for f/u regarding mitral valve disease and chronic atrial fibrillation  She has a history of CVA, HTN, vascular dementia MVR and chronic afib. MVR around 2000-11-05 done at [facility] in [place] by Dr Leeroy Bock . CHADS2VASD is 6. Echo ordered by Dr Bronson Ing 11-06-15 preop hip fracture reviewed  EF 60-65% AV sclerosis [employer] mechanical MV normal function  Severe biatrial enlargement  Estimated PA pressure 65 mmHg   Last INR 01/20/19  Rx at 2.3  Her notes indicate she had cardio embolic stroke on coumadin alone and she Has been maintained on plavix and coumadin since  She ambulates with walker in house and cane outside.  No cardiac concerns  Divorced husband KETSIA LINEBAUGH died 05-Nov-2017 was a principle with doctorate in education from [employer] But cheated on [place] quite a bit  Son is taking care of her and wife who has dementia He is opposed to COVID vaccine   Past Medical History:  Diagnosis Date  . Biatrial enlargement 12/01/2015  . Chronic anticoagulation    coumadin, CHADS2VASC=6  . History of mitral valve replacement   . Hypertension   . Hypothyroidism   . Osteoporosis    on Boniva  . Permanent atrial fibrillation ([place])   . Pulmonary hypertension, moderate to severe ([place]) 12/01/2015  . Stroke [facility])    on Clopidrigel  . UTI (lower urinary tract infection) 07/2015   n&v; AKI  . Vascular dementia [facility])     Past Surgical History:  Procedure Laterality Date  . ABDOMINAL HYSTERECTOMY    . FRACTURE SURGERY  [id number]   Operative repair with intramedullary nail intratrochanteric Left Hip fracture  . INTRAMEDULLARY (IM) NAIL INTERTROCHANTERIC Left 12/01/2015   Procedure: INTRAMEDULLARY (IM) NAIL INTERTROCHANTRIC;  Surgeon:  Carole Civil, MD;  Location: [facility];  Service: Orthopedics;  Laterality: Left;  . MITRAL VALVE REPLACEMENT       Current Outpatient Medications  Medication Sig Dispense Refill  . acetaminophen (TYLENOL) 500 MG tablet Take 500 mg by mouth every 6 (six) hours as needed for mild pain.    Marland Kitchen alendronate (FOSAMAX) 70 MG tablet TAKE 1 TABLET BY MOUTH EVERY WEDNESDAY 12 tablet 1  . amLODipine (NORVASC) 2.5 MG tablet Take 1 tablet (2.5 mg total) by mouth daily. 90 tablet 3  . atenolol (TENORMIN) 25 MG tablet Take 12.5 mg by mouth daily.    Marland Kitchen b complex vitamins tablet Take 1 tablet by mouth daily.    . clopidogrel (PLAVIX) 75 MG tablet TAKE 1 TABLET BY MOUTH EVERY DAY 90 tablet 0  . levothyroxine (SYNTHROID) 100 MCG tablet Take 1 tablet (100 mcg total) by mouth daily before breakfast. 90 tablet 0  . lisinopril (ZESTRIL) 2.5 MG tablet Take 1 tablet (2.5 mg total) by mouth daily. 90 tablet 0  . vitamin C (ASCORBIC ACID) 250 MG tablet Take 250 mg by mouth daily.     Marland Kitchen VITAMIN D, ERGOCALCIFEROL, PO Take 250 mg by mouth. Three times a week    . warfarin (COUMADIN) 2 MG tablet TAKE 1 TABLET BY MOUTH EVERY DAY 90 tablet 1  . warfarin (COUMADIN) 3 MG tablet TAKE 3 MG ONCE A WEEK AND  A TOTAL DOSE OF 4 MG ALL OTHER DAYS OF THE WEEK. 72 tablet 2   No current facility-administered medications for this visit.    Allergies:   Penicillins    Social History:  The patient  reports that she has quit smoking. Her smoking use included cigarettes. She has never used smokeless tobacco. She reports that she does not drink alcohol and does not use drugs.   Family History:  The patient's family history includes Heart disease in her father and mother; Suicidality in her son.    ROS:  Please see the history of present illness.   Otherwise, review of systems are positive for none.   All other systems are reviewed and negative.    PHYSICAL EXAM: VS:  BP 132/60   Pulse 60   Ht 5\' 3"  (1.6 m)   Wt 110 lb 12.8 oz  (50.3 kg)   SpO2 99%   BMI 19.63 kg/m  , BMI Body mass index is 19.63 kg/m. Affect appropriate Healthy:  appears stated age 24: normal Neck supple with no adenopathy JVP normal no bruits no thyromegaly Lungs clear with no wheezing and good diaphragmatic motion Heart:  S1/S2 no murmur, no rub, gallop or click PMI normal Abdomen: benighn, BS positve, no tenderness, no AAA no bruit.  No HSM or HJR Distal pulses intact with no bruits No edema Neuro non-focal Skin warm and dry No muscular weakness   EKG:  11/29/15 afib rate 66 LAD    Recent Labs: 11/02/2019: ALT 13; BUN 25; Creat 0.82; Potassium 4.7; Sodium 144; TSH 1.23    Lipid Panel No results found for: CHOL, TRIG, HDL, CHOLHDL, VLDL, LDLCALC, LDLDIRECT    Wt Readings from Last 3 Encounters:  01/20/20 110 lb 12.8 oz (50.3 kg)  12/07/19 110 lb (49.9 kg)  11/02/19 106 lb 12.8 oz (48.4 kg)      Other studies Reviewed: Additional studies/ records that were reviewed today include: notes from Dr Bronson Ing 2017 TTE notes from primary labs .   ASSESSMENT AND PLAN:  1.  MVR: normal exam no symptoms and echo with normal function 2017 [facility] 2. Anticoagulation INR Rx will arrange f/u INR with Edrick Oh in 4 weeks INR 2.3 on  01/04/20  3. Chronic Afib ECG today with good rate control INR Rx 4. CVA distant functional ambulates with cane maintain plavix with coumadin 5. HTN: Well controlled.  Continue current medications and low sodium Dash type diet.   6. Thyroid continue replacement TSH normal 10/30/17    Current medicines are reviewed at length with the patient today.  The patient does not have concerns regarding medicines.  The following changes have been made:  no change  Labs/ tests ordered today include: Coumadin clinic   Orders Placed This Encounter  Procedures  . EKG 12-Lead     Disposition:   FU with Dr Bronson Ing in 6 months  Continue  with coumadin clinic     Signed, Jenkins Rouge, MD  01/20/2020 2:39 PM     [address], [place], [place]  [address] Phone: [phone]; Fax: [phone]

## 2020-01-27 ENCOUNTER — Telehealth: Payer: Self-pay | Admitting: Cardiovascular Disease

## 2020-01-27 NOTE — Telephone Encounter (Signed)
Son Inocente Salles said his mother is not on amlodipine as he reported to [employer]

## 2020-01-27 NOTE — Telephone Encounter (Signed)
Son states he would like to verfy medication he gave to Dr.Nishan   Please call [phone]   Thanks renee

## 2020-02-10 ENCOUNTER — Telehealth (INDEPENDENT_AMBULATORY_CARE_PROVIDER_SITE_OTHER): Payer: Self-pay | Admitting: Nurse Practitioner

## 2020-02-10 NOTE — Telephone Encounter (Signed)
Please call this patient's caregiver to let him know that I filled out the majority of the paperwork he gave me so that she can attend the [place].  There are a couple of questions I am not able to complete without further discussion.  The patient needs to be scheduled for an appointment to initiate TB testing.  Please get her scheduled for an office visit with me as soon as possible, please allow 45-minute visit time if possible.  Let the caregiver know that after discussion with him and after TB testing has been completed I will be able to officially sign off on paperwork for him.  Thank you.

## 2020-02-16 ENCOUNTER — Ambulatory Visit (INDEPENDENT_AMBULATORY_CARE_PROVIDER_SITE_OTHER): Payer: Medicare PPO | Admitting: *Deleted

## 2020-02-16 DIAGNOSIS — Z952 Presence of prosthetic heart valve: Secondary | ICD-10-CM

## 2020-02-16 DIAGNOSIS — Z5181 Encounter for therapeutic drug level monitoring: Secondary | ICD-10-CM | POA: Diagnosis not present

## 2020-02-16 DIAGNOSIS — I639 Cerebral infarction, unspecified: Secondary | ICD-10-CM

## 2020-02-16 LAB — POCT INR: INR: 2.4 (ref 2.0–3.0)

## 2020-02-16 NOTE — Patient Instructions (Signed)
Take 6mg  tonight then increase dose to 3mg  daily except 4mg  on Wednesdays and Saturdays Recheck in 6 weeks

## 2020-02-23 ENCOUNTER — Encounter (INDEPENDENT_AMBULATORY_CARE_PROVIDER_SITE_OTHER): Payer: Self-pay | Admitting: Nurse Practitioner

## 2020-02-23 ENCOUNTER — Ambulatory Visit (INDEPENDENT_AMBULATORY_CARE_PROVIDER_SITE_OTHER): Payer: Medicare PPO | Admitting: Nurse Practitioner

## 2020-02-23 ENCOUNTER — Encounter (INDEPENDENT_AMBULATORY_CARE_PROVIDER_SITE_OTHER): Payer: Self-pay

## 2020-02-23 ENCOUNTER — Other Ambulatory Visit: Payer: Self-pay

## 2020-02-23 VITALS — BP 125/60 | HR 64 | Temp 97.3°F | Ht 63.6 in | Wt 110.0 lb

## 2020-02-23 DIAGNOSIS — Z111 Encounter for screening for respiratory tuberculosis: Secondary | ICD-10-CM | POA: Diagnosis not present

## 2020-02-23 NOTE — Progress Notes (Signed)
Subjective:  Patient ID: Courtney [name], female    DOB: April 19, 1929  Age: 86 y.o. MRN: [id number]  CC:  Chief Complaint  Patient presents with  . TB Testing      HPI  This patient arrives today accompanied by her son for tuberculosis screening in preparation for becoming a participant at the [facility] adult daycare and health service.  She has no acute complaints today.   Past Medical History:  Diagnosis Date  . Biatrial enlargement 12/01/2015  . Chronic anticoagulation    coumadin, CHADS2VASC=6  . History of mitral valve replacement   . Hypertension   . Hypothyroidism   . Osteoporosis    on Boniva  . Permanent atrial fibrillation ([place])   . Pulmonary hypertension, moderate to severe ([place]) 12/01/2015  . Stroke [facility])    on Clopidrigel  . UTI (lower urinary tract infection) 07/2015   n&v; AKI  . Vascular dementia ([place])       Family History  Problem Relation Age of Onset  . Heart disease Mother   . Heart disease Father   . Suicidality Son   . Stroke Neg Hx   . Cancer Neg Hx   . Diabetes Neg Hx     Social History   Social History Narrative   Lives with son when in [place]. Has a house in [place], [place]. Husband died.    Had a son that committed suicide. Has dementia.    Social History   Tobacco Use  . Smoking status: Former Smoker    Types: Cigarettes  . Smokeless tobacco: Never Used  Substance Use Topics  . Alcohol use: No     Current Meds  Medication Sig  . acetaminophen (TYLENOL) 500 MG tablet Take 500 mg by mouth every 6 (six) hours as needed for mild pain.  Marland Kitchen alendronate (FOSAMAX) 70 MG tablet TAKE 1 TABLET BY MOUTH EVERY WEDNESDAY  . atenolol (TENORMIN) 25 MG tablet Take 12.5 mg by mouth daily.  Marland Kitchen b complex vitamins tablet Take 1 tablet by mouth daily.  . clopidogrel (PLAVIX) 75 MG tablet TAKE 1 TABLET BY MOUTH EVERY DAY  . levothyroxine (SYNTHROID) 100 MCG tablet Take 1 tablet (100 mcg total) by mouth daily before breakfast.  . lisinopril  (ZESTRIL) 2.5 MG tablet Take 1 tablet (2.5 mg total) by mouth daily.  . vitamin C (ASCORBIC ACID) 250 MG tablet Take 250 mg by mouth daily.   Marland Kitchen VITAMIN D, ERGOCALCIFEROL, PO Take 250 mg by mouth. Three times a week  . warfarin (COUMADIN) 2 MG tablet TAKE 1 TABLET BY MOUTH EVERY DAY    ROS:  Review of Systems  Constitutional: Negative.   Respiratory: Negative.   Cardiovascular: Negative.   Neurological: Negative.      Objective:   Today's Vitals: BP 125/60 (BP Location: Right Arm, Patient Position: Sitting, Cuff Size: Normal)   Pulse (!) 50   Temp (!) 97.3 F (36.3 C)   Ht 5' 3.6" (1.615 m)   Wt 110 lb (49.9 kg)   SpO2 (!) 84%   BMI 19.12 kg/m  Vitals with BMI 02/23/2020 01/20/2020 12/07/2019  Height 5' 3.6" 5\' 3"  5\' 3"   Weight 110 lbs 110 lbs 13 oz 110 lbs  BMI 19.13 23.55 73.22  Systolic 025 427 062  Diastolic 60 60 80  Pulse 50 60 57     Physical Exam Constitutional:      Appearance: Normal appearance.  HENT:     Head: Normocephalic and atraumatic.  Neurological:  Mental Status: She is alert. Mental status is at baseline.          Assessment and Plan   1. Screening-pulmonary TB      Plan: 1.  Unfortunately to complete tuberculosis skin testing she would have to follow-up in 48 to 72 hours, however the office will be closed in 48 to 72 hours.  I did discuss this with the patient's son and he would like to move forward with QuantiFERON serologic testing in place of TB skin test as he does not wish to reschedule the appointment today.  Blood test has been ordered.  As long as this comes back negative we will sign off on the forms for the sleep center and will notify the patient so that they are ready for being picked up.   Tests ordered Orders Placed This Encounter  Procedures  . QuantiFERON-TB Gold Plus      No orders of the defined types were placed in this encounter.   Patient to follow-up as scheduled in August with Dr. Anastasio Champion.  Ailene Ards, NP

## 2020-02-23 NOTE — Progress Notes (Signed)
This encounter was created in error - please disregard.

## 2020-02-25 LAB — QUANTIFERON-TB GOLD PLUS
Mitogen-NIL: >10 IU/mL
NIL: 0.03 IU/mL
QuantiFERON-TB Gold Plus: NEGATIVE
TB1-NIL: 0.01 IU/mL
TB2-NIL: 0 IU/mL

## 2020-02-28 NOTE — Progress Notes (Signed)
I left Sam a detailed voicemail explaining the results & recommendation

## 2020-02-29 ENCOUNTER — Other Ambulatory Visit (INDEPENDENT_AMBULATORY_CARE_PROVIDER_SITE_OTHER): Payer: Self-pay | Admitting: Internal Medicine

## 2020-02-29 DIAGNOSIS — F0150 Vascular dementia without behavioral disturbance: Secondary | ICD-10-CM

## 2020-03-06 ENCOUNTER — Other Ambulatory Visit (INDEPENDENT_AMBULATORY_CARE_PROVIDER_SITE_OTHER): Payer: Self-pay | Admitting: Internal Medicine

## 2020-03-14 ENCOUNTER — Ambulatory Visit (INDEPENDENT_AMBULATORY_CARE_PROVIDER_SITE_OTHER): Payer: Medicare PPO | Admitting: Internal Medicine

## 2020-03-14 ENCOUNTER — Telehealth (INDEPENDENT_AMBULATORY_CARE_PROVIDER_SITE_OTHER): Payer: Self-pay

## 2020-03-15 NOTE — Telephone Encounter (Signed)
Lvm;no return call baCK.

## 2020-03-22 ENCOUNTER — Other Ambulatory Visit (INDEPENDENT_AMBULATORY_CARE_PROVIDER_SITE_OTHER): Payer: Self-pay | Admitting: Internal Medicine

## 2020-03-22 DIAGNOSIS — I482 Chronic atrial fibrillation, unspecified: Secondary | ICD-10-CM

## 2020-03-29 ENCOUNTER — Ambulatory Visit (INDEPENDENT_AMBULATORY_CARE_PROVIDER_SITE_OTHER): Payer: Medicare PPO | Admitting: *Deleted

## 2020-03-29 DIAGNOSIS — Z952 Presence of prosthetic heart valve: Secondary | ICD-10-CM | POA: Diagnosis not present

## 2020-03-29 DIAGNOSIS — I639 Cerebral infarction, unspecified: Secondary | ICD-10-CM

## 2020-03-29 DIAGNOSIS — Z5181 Encounter for therapeutic drug level monitoring: Secondary | ICD-10-CM | POA: Diagnosis not present

## 2020-03-29 LAB — POCT INR: INR: 2 (ref 2.0–3.0)

## 2020-03-29 NOTE — Patient Instructions (Signed)
Take 7mg  tonight then increase dose to 3mg  daily except 4mg  on Mondays, Wednesdays and Fridays Recheck in 6 weeks

## 2020-04-17 ENCOUNTER — Ambulatory Visit (INDEPENDENT_AMBULATORY_CARE_PROVIDER_SITE_OTHER): Payer: Medicare PPO | Admitting: Internal Medicine

## 2020-05-10 ENCOUNTER — Ambulatory Visit (INDEPENDENT_AMBULATORY_CARE_PROVIDER_SITE_OTHER): Payer: Medicare PPO | Admitting: *Deleted

## 2020-05-10 DIAGNOSIS — I639 Cerebral infarction, unspecified: Secondary | ICD-10-CM

## 2020-05-10 DIAGNOSIS — Z5181 Encounter for therapeutic drug level monitoring: Secondary | ICD-10-CM | POA: Diagnosis not present

## 2020-05-10 DIAGNOSIS — Z952 Presence of prosthetic heart valve: Secondary | ICD-10-CM | POA: Diagnosis not present

## 2020-05-10 LAB — POCT INR: INR: 2.7 (ref 2.0–3.0)

## 2020-05-10 NOTE — Patient Instructions (Signed)
Continue warfarin 3mg  daily except 4mg  on Mondays, Wednesdays and Fridays Recheck in 6 weeks

## 2020-06-05 ENCOUNTER — Other Ambulatory Visit (INDEPENDENT_AMBULATORY_CARE_PROVIDER_SITE_OTHER): Payer: Self-pay | Admitting: Nurse Practitioner

## 2020-06-16 ENCOUNTER — Emergency Department (HOSPITAL_COMMUNITY): Payer: Medicare PPO

## 2020-06-16 ENCOUNTER — Encounter (HOSPITAL_COMMUNITY): Payer: Self-pay | Admitting: Emergency Medicine

## 2020-06-16 ENCOUNTER — Other Ambulatory Visit: Payer: Self-pay

## 2020-06-16 ENCOUNTER — Inpatient Hospital Stay (HOSPITAL_COMMUNITY)
Admission: EM | Admit: 2020-06-16 | Discharge: 2020-06-20 | DRG: 535 | Disposition: A | Discharge status: Skilled Nursing Facility | Payer: Medicare PPO | Attending: Family Medicine | Admitting: Family Medicine

## 2020-06-16 DIAGNOSIS — W19XXXA Unspecified fall, initial encounter: Secondary | ICD-10-CM

## 2020-06-16 DIAGNOSIS — Z681 Body mass index (BMI) 19 or less, adult: Secondary | ICD-10-CM

## 2020-06-16 DIAGNOSIS — I4821 Permanent atrial fibrillation: Secondary | ICD-10-CM | POA: Diagnosis present

## 2020-06-16 DIAGNOSIS — I272 Pulmonary hypertension, unspecified: Secondary | ICD-10-CM | POA: Diagnosis present

## 2020-06-16 DIAGNOSIS — I639 Cerebral infarction, unspecified: Secondary | ICD-10-CM | POA: Diagnosis present

## 2020-06-16 DIAGNOSIS — Z880 Allergy status to penicillin: Secondary | ICD-10-CM

## 2020-06-16 DIAGNOSIS — R319 Hematuria, unspecified: Secondary | ICD-10-CM | POA: Diagnosis present

## 2020-06-16 DIAGNOSIS — W1830XA Fall on same level, unspecified, initial encounter: Secondary | ICD-10-CM | POA: Diagnosis present

## 2020-06-16 DIAGNOSIS — R296 Repeated falls: Secondary | ICD-10-CM | POA: Diagnosis not present

## 2020-06-16 DIAGNOSIS — I10 Essential (primary) hypertension: Secondary | ICD-10-CM | POA: Diagnosis present

## 2020-06-16 DIAGNOSIS — G9341 Metabolic encephalopathy: Secondary | ICD-10-CM | POA: Diagnosis present

## 2020-06-16 DIAGNOSIS — M808AXA Other osteoporosis with current pathological fracture, other site, initial encounter for fracture: Secondary | ICD-10-CM | POA: Diagnosis present

## 2020-06-16 DIAGNOSIS — S32602A Unspecified fracture of left ischium, initial encounter for closed fracture: Secondary | ICD-10-CM | POA: Diagnosis present

## 2020-06-16 DIAGNOSIS — Z7902 Long term (current) use of antithrombotics/antiplatelets: Secondary | ICD-10-CM

## 2020-06-16 DIAGNOSIS — M25551 Pain in right hip: Secondary | ICD-10-CM | POA: Diagnosis present

## 2020-06-16 DIAGNOSIS — R54 Age-related physical debility: Secondary | ICD-10-CM | POA: Diagnosis present

## 2020-06-16 DIAGNOSIS — Z515 Encounter for palliative care: Secondary | ICD-10-CM

## 2020-06-16 DIAGNOSIS — N390 Urinary tract infection, site not specified: Secondary | ICD-10-CM | POA: Diagnosis present

## 2020-06-16 DIAGNOSIS — S3282XA Multiple fractures of pelvis without disruption of pelvic ring, initial encounter for closed fracture: Secondary | ICD-10-CM

## 2020-06-16 DIAGNOSIS — Z66 Do not resuscitate: Secondary | ICD-10-CM | POA: Diagnosis present

## 2020-06-16 DIAGNOSIS — Z952 Presence of prosthetic heart valve: Secondary | ICD-10-CM

## 2020-06-16 DIAGNOSIS — S32592A Other specified fracture of left pubis, initial encounter for closed fracture: Secondary | ICD-10-CM | POA: Diagnosis not present

## 2020-06-16 DIAGNOSIS — Z7983 Long term (current) use of bisphosphonates: Secondary | ICD-10-CM

## 2020-06-16 DIAGNOSIS — Z79890 Hormone replacement therapy (postmenopausal): Secondary | ICD-10-CM

## 2020-06-16 DIAGNOSIS — Z79899 Other long term (current) drug therapy: Secondary | ICD-10-CM

## 2020-06-16 DIAGNOSIS — E039 Hypothyroidism, unspecified: Secondary | ICD-10-CM | POA: Diagnosis present

## 2020-06-16 DIAGNOSIS — Z7901 Long term (current) use of anticoagulants: Secondary | ICD-10-CM | POA: Diagnosis not present

## 2020-06-16 DIAGNOSIS — Z87891 Personal history of nicotine dependence: Secondary | ICD-10-CM

## 2020-06-16 DIAGNOSIS — Z20822 Contact with and (suspected) exposure to covid-19: Secondary | ICD-10-CM | POA: Diagnosis present

## 2020-06-16 DIAGNOSIS — I69311 Memory deficit following cerebral infarction: Secondary | ICD-10-CM

## 2020-06-16 DIAGNOSIS — G9389 Other specified disorders of brain: Secondary | ICD-10-CM | POA: Diagnosis present

## 2020-06-16 DIAGNOSIS — E538 Deficiency of other specified B group vitamins: Secondary | ICD-10-CM | POA: Diagnosis present

## 2020-06-16 DIAGNOSIS — M25552 Pain in left hip: Secondary | ICD-10-CM | POA: Diagnosis present

## 2020-06-16 DIAGNOSIS — F0150 Vascular dementia without behavioral disturbance: Secondary | ICD-10-CM | POA: Diagnosis present

## 2020-06-16 DIAGNOSIS — R791 Abnormal coagulation profile: Secondary | ICD-10-CM | POA: Diagnosis present

## 2020-06-16 DIAGNOSIS — Z7189 Other specified counseling: Secondary | ICD-10-CM

## 2020-06-16 DIAGNOSIS — Z9181 History of falling: Secondary | ICD-10-CM

## 2020-06-16 DIAGNOSIS — I482 Chronic atrial fibrillation, unspecified: Secondary | ICD-10-CM | POA: Diagnosis present

## 2020-06-16 DIAGNOSIS — D62 Acute posthemorrhagic anemia: Secondary | ICD-10-CM | POA: Diagnosis present

## 2020-06-16 DIAGNOSIS — Z8249 Family history of ischemic heart disease and other diseases of the circulatory system: Secondary | ICD-10-CM

## 2020-06-16 DIAGNOSIS — D696 Thrombocytopenia, unspecified: Secondary | ICD-10-CM | POA: Diagnosis present

## 2020-06-16 LAB — COMPREHENSIVE METABOLIC PANEL
ALT: 26 U/L (ref 0–44)
AST: 28 U/L (ref 15–41)
Albumin: 4 g/dL (ref 3.5–5.0)
Alkaline Phosphatase: 67 U/L (ref 38–126)
Anion gap: 6 (ref 5–15)
BUN: 22 mg/dL (ref 8–23)
CO2: 29 mmol/L (ref 22–32)
Calcium: 9.6 mg/dL (ref 8.9–10.3)
Chloride: 106 mmol/L (ref 98–111)
Creatinine, Ser: 0.78 mg/dL (ref 0.44–1.00)
GFR, Estimated: >60 mL/min (ref >60)
Glucose, Bld: 103 mg/dL — ABNORMAL HIGH (ref 70–99)
Potassium: 4.1 mmol/L (ref 3.5–5.1)
Sodium: 141 mmol/L (ref 135–145)
Total Bilirubin: 0.8 mg/dL (ref 0.3–1.2)
Total Protein: 6.6 g/dL (ref 6.5–8.1)

## 2020-06-16 LAB — URINALYSIS, ROUTINE W REFLEX MICROSCOPIC

## 2020-06-16 LAB — CBC WITH DIFFERENTIAL/PLATELET
Abs Immature Granulocytes: 0.12 10*3/uL — ABNORMAL HIGH (ref 0.00–0.07)
Basophils Absolute: 0 10*3/uL (ref 0.0–0.1)
Basophils Relative: 0 %
Eosinophils Absolute: 0 10*3/uL (ref 0.0–0.5)
Eosinophils Relative: 1 %
HCT: 38.5 % (ref 36.0–46.0)
Hemoglobin: 11.9 g/dL — ABNORMAL LOW (ref 12.0–15.0)
Immature Granulocytes: 1 %
Lymphocytes Relative: 9 %
Lymphs Abs: 0.8 10*3/uL (ref 0.7–4.0)
MCH: 28.6 pg (ref 26.0–34.0)
MCHC: 30.9 g/dL (ref 30.0–36.0)
MCV: 92.5 fL (ref 80.0–100.0)
Monocytes Absolute: 0.5 10*3/uL (ref 0.1–1.0)
Monocytes Relative: 6 %
Neutro Abs: 7.1 10*3/uL (ref 1.7–7.7)
Neutrophils Relative %: 83 %
Platelets: 162 10*3/uL (ref 150–400)
RBC: 4.16 MIL/uL (ref 3.87–5.11)
RDW: 15 % (ref 11.5–15.5)
WBC: 8.5 10*3/uL (ref 4.0–10.5)
nRBC: 0 % (ref 0.0–0.2)

## 2020-06-16 LAB — URINALYSIS, MICROSCOPIC (REFLEX): RBC / HPF: >50 RBC/hpf (ref 0–5)

## 2020-06-16 LAB — PROTIME-INR
INR: 2.8 — ABNORMAL HIGH (ref 0.8–1.2)
Prothrombin Time: 28.8 seconds — ABNORMAL HIGH (ref 11.4–15.2)

## 2020-06-16 LAB — RESPIRATORY PANEL BY RT PCR (FLU A&B, COVID)
Influenza A by PCR: NEGATIVE
Influenza B by PCR: NEGATIVE
SARS Coronavirus 2 by RT PCR: NEGATIVE

## 2020-06-16 MED ORDER — B COMPLEX-C PO TABS
1.0000 | ORAL_TABLET | Freq: Every day | ORAL | Status: DC
  Administered 2020-06-17 – 2020-06-20 (×4): 1 via ORAL
  Filled 2020-06-16 (×6): qty 1

## 2020-06-16 MED ORDER — SODIUM CHLORIDE 0.9 % IV SOLN
1.0000 g | INTRAVENOUS | Status: DC
  Administered 2020-06-17 – 2020-06-18 (×2): 1 g via INTRAVENOUS
  Filled 2020-06-16 (×2): qty 10

## 2020-06-16 MED ORDER — ACETAMINOPHEN 500 MG PO TABS: 500.0000 mg | ORAL_TABLET | Freq: Four times a day (QID) | ORAL | Status: DC | PRN

## 2020-06-16 MED ORDER — LEVOTHYROXINE SODIUM 100 MCG PO TABS
100.0000 ug | ORAL_TABLET | Freq: Every day | ORAL | Status: DC
  Administered 2020-06-17: 100 ug via ORAL
  Filled 2020-06-16: qty 2

## 2020-06-16 MED ORDER — ACETAMINOPHEN 650 MG RE SUPP: 650.0000 mg | Freq: Four times a day (QID) | RECTAL | Status: DC | PRN

## 2020-06-16 MED ORDER — WARFARIN SODIUM 1 MG PO TABS
4.0000 mg | ORAL_TABLET | ORAL | Status: DC
  Administered 2020-06-19: 4 mg via ORAL
  Filled 2020-06-16: qty 4
  Filled 2020-06-16: qty 1
  Filled 2020-06-16: qty 4

## 2020-06-16 MED ORDER — WARFARIN SODIUM 1 MG PO TABS
3.0000 mg | ORAL_TABLET | ORAL | Status: DC
  Administered 2020-06-17 – 2020-06-20 (×3): 3 mg via ORAL
  Filled 2020-06-16 (×2): qty 1
  Filled 2020-06-16 (×2): qty 3

## 2020-06-16 MED ORDER — DOCUSATE SODIUM 100 MG PO CAPS
100.0000 mg | ORAL_CAPSULE | Freq: Two times a day (BID) | ORAL | Status: DC
  Administered 2020-06-17 – 2020-06-20 (×5): 100 mg via ORAL
  Filled 2020-06-16 (×7): qty 1

## 2020-06-16 MED ORDER — ATENOLOL 25 MG PO TABS
12.5000 mg | ORAL_TABLET | Freq: Every day | ORAL | Status: DC
  Administered 2020-06-17 – 2020-06-20 (×4): 12.5 mg via ORAL
  Filled 2020-06-16 (×4): qty 1

## 2020-06-16 MED ORDER — HYDROCODONE-ACETAMINOPHEN 5-325 MG PO TABS
1.0000 | ORAL_TABLET | ORAL | Status: DC | PRN
  Administered 2020-06-19: 1 via ORAL
  Filled 2020-06-16: qty 1

## 2020-06-16 MED ORDER — LISINOPRIL 5 MG PO TABS
2.5000 mg | ORAL_TABLET | Freq: Every day | ORAL | Status: DC
  Administered 2020-06-17 – 2020-06-20 (×4): 2.5 mg via ORAL
  Filled 2020-06-16 (×4): qty 1

## 2020-06-16 MED ORDER — CLOPIDOGREL BISULFATE 75 MG PO TABS
75.0000 mg | ORAL_TABLET | Freq: Every day | ORAL | Status: DC
  Administered 2020-06-17 – 2020-06-20 (×4): 75 mg via ORAL
  Filled 2020-06-16 (×4): qty 1

## 2020-06-16 MED ORDER — ASCORBIC ACID 500 MG PO TABS
250.0000 mg | ORAL_TABLET | Freq: Every day | ORAL | Status: DC
  Administered 2020-06-17 – 2020-06-20 (×4): 250 mg via ORAL
  Filled 2020-06-16 (×4): qty 1

## 2020-06-16 MED ORDER — SODIUM CHLORIDE 0.9 % IV SOLN
1.0000 g | Freq: Once | INTRAVENOUS | Status: AC
  Administered 2020-06-16: 1 g via INTRAVENOUS
  Filled 2020-06-16: qty 10

## 2020-06-16 MED ORDER — ACETAMINOPHEN 325 MG PO TABS
650.0000 mg | ORAL_TABLET | Freq: Four times a day (QID) | ORAL | Status: DC | PRN
  Administered 2020-06-18 – 2020-06-19 (×2): 650 mg via ORAL
  Filled 2020-06-16 (×2): qty 2

## 2020-06-16 MED ORDER — WARFARIN - PHARMACIST DOSING INPATIENT: Freq: Every day | Status: DC

## 2020-06-16 NOTE — ED Notes (Signed)
Patient denies pain and is resting comfortably.  

## 2020-06-16 NOTE — H&P (Signed)
TRH H&P   Patient Demographics:    Courtney [name], is a 86 y.o. female  MRN: [id number]   DOB - [birth year]  Admit Date - 06/16/2020  Outpatient Primary MD for the patient is Doree Albee, MD  Referring MD: Dr Laverta Baltimore  Patient coming from: Home  Chief Complaint  Patient presents with  . Fall      HPI:    Courtney [name]  is a 86 y.o. female, with past medical history significant for permanent A. fib, mitral valve replacement, chronic anticoagulation on Coumadin, pulmonary hypertension, CVA, vascular dementia, patient presents to ED secondary to fall, at baseline patient with poor balance, ambulates mainly with walker, she is with history of multiple falls in the past, she is on warfarin for mechanical heart valve, patient had mechanical fall this morning, no dizziness, no lightheadedness, no head trauma, she lost her balance while on the walker, son tried to catch her, he was unable to, patient landed on her bottom, and then fell backward, patient told son she was in pain, but was unable to locate where is the pain, so he brought her to ED for further evaluation. -In ED patient INR was therapeutic at 2.8, her CT pelvis was significant for left sacral/rami fracture, Per  ED discussion with Dr. Doreatha Martin, this is nonoperative management, recommendation for weightbearing as tolerated, so [facility] hospitalist consulted to admit.    Review of systems:    Is with advanced dementia, she cannot provide any appropriate review of system. With Past History of the following :    Past Medical History:  Diagnosis Date  . Biatrial enlargement 12/01/2015  . Chronic anticoagulation    coumadin, CHADS2VASC=6  . History of mitral valve replacement   . Hypertension   . Hypothyroidism   . Osteoporosis    on Boniva  . Permanent atrial fibrillation ([place])   . Pulmonary hypertension, moderate to severe ([place])  12/01/2015  . Stroke [facility])    on Clopidrigel  . UTI (lower urinary tract infection) 07/2015   n&v; AKI  . Vascular dementia [facility])       Past Surgical History:  Procedure Laterality Date  . ABDOMINAL HYSTERECTOMY    . FRACTURE SURGERY  [id number]   Operative repair with intramedullary nail intratrochanteric Left Hip fracture  . INTRAMEDULLARY (IM) NAIL INTERTROCHANTERIC Left 12/01/2015   Procedure: INTRAMEDULLARY (IM) NAIL INTERTROCHANTRIC;  Surgeon: Carole Civil, MD;  Location: [facility];  Service: Orthopedics;  Laterality: Left;  . MITRAL VALVE REPLACEMENT        Social History:     Social History   Tobacco Use  . Smoking status: Former Smoker    Types: Cigarettes  . Smokeless tobacco: Never Used  Substance Use Topics  . Alcohol use: No      Family History :     Family History  Problem Relation Age of Onset  . Heart disease  Mother   . Heart disease Father   . Suicidality Son   . Stroke Neg Hx   . Cancer Neg Hx   . Diabetes Neg Hx       Home Medications:   Prior to Admission medications   Medication Sig Start Date End Date Taking? Authorizing Provider  acetaminophen (TYLENOL) 500 MG tablet Take 500 mg by mouth every 6 (six) hours as needed for mild pain.   Yes [provider]  alendronate (FOSAMAX) 70 MG tablet TAKE 1 TABLET BY MOUTH EVERY WEDNESDAY Patient taking differently: Take 70 mg by mouth once a week.  09/01/19  Yes Gosrani, Nimish C, MD  atenolol (TENORMIN) 25 MG tablet Take 12.5 mg by mouth daily.   Yes [provider]  b complex vitamins tablet Take 1 tablet by mouth daily.   Yes [provider]  clopidogrel (PLAVIX) 75 MG tablet TAKE 1 TABLET BY MOUTH EVERY DAY 03/22/20  Yes Ailene Ards, NP  levothyroxine (SYNTHROID) 100 MCG tablet TAKE 1 TABLET BY MOUTH EVERY DAY BEFORE BREAKFAST Patient taking differently: Take 100 mcg by mouth daily before breakfast.  06/05/20  Yes Ailene Ards, NP  lisinopril (ZESTRIL) 2.5 MG tablet  Take 1 tablet (2.5 mg total) by mouth daily. 01/11/20  Yes Ailene Ards, NP  vitamin C (ASCORBIC ACID) 250 MG tablet Take 250 mg by mouth daily.    Yes [provider]  warfarin (COUMADIN) 2 MG tablet TAKE 1 TABLET BY MOUTH EVERY DAY 01/08/20  Yes Anastasio Champion, Nimish C, MD  warfarin (COUMADIN) 3 MG tablet Take 6mg  tonight then resume 3mg  daily except 4mg  on Wednesdays 01/20/20  Yes Josue Hector, MD  VITAMIN D, ERGOCALCIFEROL, PO Take 250 mg by mouth. Three times a week Patient not taking: Reported on 06/16/2020    [provider]  alendronate (FOSAMAX) 70 MG tablet once a week.  05/29/18   [provider]  amLODipine (NORVASC) 2.5 MG tablet Take 1 tablet (2.5 mg total) by mouth daily. 07/06/19   Doree Albee, MD  atenolol (TENORMIN) 25 MG tablet Take 0.5 tablets (12.5 mg total) by mouth daily. 02/10/18   Caren Macadam, MD  clopidogrel (PLAVIX) 75 MG tablet Take 1 tablet (75 mg total) by mouth daily. 02/10/18   Caren Macadam, MD  clopidogrel (PLAVIX) 75 MG tablet Take 1 tablet (75 mg total) by mouth daily. 06/20/19   Doree Albee, MD  clopidogrel (PLAVIX) 75 MG tablet TAKE 1 TABLET BY MOUTH EVERY DAY 10/14/19   Hurshel Party C, MD  warfarin (COUMADIN) 2 MG tablet Take 1 tablet (2 mg total) by mouth daily. 04/27/19   Doree Albee, MD  warfarin (COUMADIN) 2 MG tablet TAKE 1 TABLET BY MOUTH EVERY DAY 07/22/19   Doree Albee, MD     Allergies:     Allergies  Allergen Reactions  . Penicillins Swelling    Has patient had a PCN reaction causing immediate rash, facial/tongue/throat swelling, SOB or lightheadedness with hypotension: Yes Has patient had a PCN reaction causing severe rash involving mucus membranes or skin necrosis: No Has patient had a PCN reaction that required hospitalization No Has patient had a PCN reaction occurring within the last 10 years: No If all of the above answers are "NO", then may proceed with Cephalosporin use. Whole body "swelled  up" and had to go to the hospital     Physical Exam:   Vitals  Blood pressure (!) 142/77, pulse 73, temperature 99.8  F (37.7 C), temperature source Oral, resp. rate 15, height 5\' 3"  (1.6 m), weight 49.9 kg, SpO2 97 %.   1. General elderly female, frail, laying in bed, no apparent distress  2.  Patient with advanced dementia, but pleasant, extremely poor historian .  3. No F.N deficits, ALL C.Nerves Intact, Strength 5/5 all 4 extremities, Sensation intact all 4 extremities, Plantars down going.  4. Ears and Eyes appear Normal, Conjunctivae clear, PERRLA. Moist Oral Mucosa.  5. Supple Neck, No JVD, No cervical lymphadenopathy appriciated, No Carotid Bruits.  6. Symmetrical Chest wall movement, Good air movement bilaterally, CTAB.  7.  Regular rate regular, with mechanical valve click  8. Positive Bowel Sounds, Abdomen Soft, No tenderness, No organomegaly appriciated,No rebound -guarding or rigidity.  9.  No Cyanosis, Normal Skin Turgor, and with multiple skin bruising.  10. Good muscle tone,  joints appear normal , no effusions  11. No Palpable Lymph Nodes in Neck or Axillae    Data Review:    CBC Recent Labs  Lab 06/16/20 1650  WBC 8.5  HGB 11.9*  HCT 38.5  PLT 162  MCV 92.5  MCH 28.6  MCHC 30.9  RDW 15.0  LYMPHSABS 0.8  MONOABS 0.5  EOSABS 0.0  BASOSABS 0.0   ------------------------------------------------------------------------------------------------------------------  Chemistries  Recent Labs  Lab 06/16/20 1650  NA 141  K 4.1  CL 106  CO2 29  GLUCOSE 103*  BUN 22  CREATININE 0.78  CALCIUM 9.6  AST 28  ALT 26  ALKPHOS 67  BILITOT 0.8   ------------------------------------------------------------------------------------------------------------------ estimated creatinine clearance is 37.6 mL/min (by C-G formula based on SCr of 0.78  mg/dL). ------------------------------------------------------------------------------------------------------------------ No results for input(s): TSH, T4TOTAL, T3FREE, THYROIDAB in the last 72 hours.  Invalid input(s): FREET3  Coagulation profile Recent Labs  Lab 06/16/20 1650  INR 2.8*   ------------------------------------------------------------------------------------------------------------------- No results for input(s): DDIMER in the last 72 hours. -------------------------------------------------------------------------------------------------------------------  Cardiac Enzymes No results for input(s): CKMB, TROPONINI, MYOGLOBIN in the last 168 hours.  Invalid input(s): CK ------------------------------------------------------------------------------------------------------------------ No results found for: BNP   ---------------------------------------------------------------------------------------------------------------  Urinalysis    Component Value Date/Time   COLORURINE RED (A) 06/16/2020 1725   APPEARANCEUR TURBID (A) 06/16/2020 1725   LABSPEC  06/16/2020 1725    TEST NOT REPORTED DUE TO COLOR INTERFERENCE OF URINE PIGMENT   PHURINE  06/16/2020 1725    TEST NOT REPORTED DUE TO COLOR INTERFERENCE OF URINE PIGMENT   GLUCOSEU (A) 06/16/2020 1725    TEST NOT REPORTED DUE TO COLOR INTERFERENCE OF URINE PIGMENT   HGBUR (A) 06/16/2020 1725    TEST NOT REPORTED DUE TO COLOR INTERFERENCE OF URINE PIGMENT   BILIRUBINUR (A) 06/16/2020 1725    TEST NOT REPORTED DUE TO COLOR INTERFERENCE OF URINE PIGMENT   KETONESUR (A) 06/16/2020 1725    TEST NOT REPORTED DUE TO COLOR INTERFERENCE OF URINE PIGMENT   PROTEINUR (A) 06/16/2020 1725    TEST NOT REPORTED DUE TO COLOR INTERFERENCE OF URINE PIGMENT   NITRITE (A) 06/16/2020 1725    TEST NOT REPORTED DUE TO COLOR INTERFERENCE OF URINE PIGMENT   LEUKOCYTESUR (A) 06/16/2020 1725    TEST NOT REPORTED DUE TO COLOR INTERFERENCE  OF URINE PIGMENT    ----------------------------------------------------------------------------------------------------------------   Imaging Results:    DG Chest 2 View  Result Date: 06/16/2020 CLINICAL DATA:  [place], bilateral hip pain EXAM: CHEST - 2 VIEW COMPARISON:  05/29/2017 FINDINGS: Frontal and lateral views of the chest demonstrate an enlarged cardiac silhouette. Postsurgical changes from median  sternotomy and mitral valve replacement. Background scarring again noted without focal consolidation, effusion, or pneumothorax. No acute bony abnormalities. IMPRESSION: 1. Stable exam, no acute process. Electronically Signed   By: Randa Ngo M.D.   On: 06/16/2020 16:55   CT Head Wo Contrast  Result Date: 06/16/2020 CLINICAL DATA:  [place], hit head, hypertension EXAM: CT HEAD WITHOUT CONTRAST TECHNIQUE: Contiguous axial images were obtained from the base of the skull through the vertex without intravenous contrast. COMPARISON:  05/13/2018 FINDINGS: Brain: Scattered areas of chronic small vessel ischemic changes are seen throughout the periventricular white matter, stable. Chronic encephalomalacia right frontal lobe from prior cortical infarct. No acute infarct or hemorrhage. Lateral ventricles and midline structures are unremarkable. No acute extra-axial fluid collections. No mass effect. Vascular: No hyperdense vessel or unexpected calcification. Skull: Normal. Negative for fracture or focal lesion. Sinuses/Orbits: No acute finding. Other: None. IMPRESSION: 1. Chronic ischemic changes as above, no acute intracranial process. Electronically Signed   By: Randa Ngo M.D.   On: 06/16/2020 18:54   CT Cervical Spine Wo Contrast  Result Date: 06/16/2020 CLINICAL DATA:  [place], hypertension EXAM: CT CERVICAL SPINE WITHOUT CONTRAST TECHNIQUE: Multidetector CT imaging of the cervical spine was performed without intravenous contrast. Multiplanar CT image reconstructions were also generated. COMPARISON:   05/13/2018 FINDINGS: Alignment: Alignment is anatomic. Skull base and vertebrae: No acute fracture. No primary bone lesion or focal pathologic process. Soft tissues and spinal canal: No prevertebral fluid or swelling. No visible canal hematoma. Disc levels: Extensive multilevel spondylosis and facet hypertrophy is unchanged. Bony fusion across the C2/C3 facet joints. Upper chest: Airway is patent.  Lung apices are clear. Other: Reconstructed images demonstrate no additional findings. IMPRESSION: 1. No acute cervical spine fracture. Stable multilevel degenerative changes. Electronically Signed   By: Randa Ngo M.D.   On: 06/16/2020 18:57   CT PELVIS WO CONTRAST  Result Date: 06/16/2020 CLINICAL DATA:  Fall, bilateral hip pain, abnormal x-ray EXAM: CT PELVIS WITHOUT CONTRAST TECHNIQUE: Multidetector CT imaging of the pelvis was performed following the standard protocol without intravenous contrast. COMPARISON:  None. FINDINGS: Urinary Tract: 3 mm nonobstructing calculus noted within the visualized lower pole of the right kidney. Vascular calcifications are noted within the visualized renal hila. Preserved renal cortical thickness. No hydronephrosis. The bladder is decompressed, but appears relatively thick walled and demonstrates mild perivesicular inflammatory stranding suggesting a superimposed infectious or inflammatory cystitis. Bowel:  Unremarkable.  No free intraperitoneal fluid Vascular/Lymphatic: Extensive aortoiliac atherosclerotic calcification. No aortic aneurysm. No pathologic adenopathy within the abdomen and pelvis. Reproductive: 2.7 cm incompletely evaluated but simple appearing cyst noted within the right ovary. Uterus absent., Other: Infiltrative soft tissue within the left pelvic sidewall and left operator internus musculature is in keeping with intramuscular and interstitial hemorrhage related to multiple pelvic fractures described below. No abdominal wall hernia identified.  Musculoskeletal: Left hip ORIF utilizing a gamma nail has been performed, partially visualized. The osseous structures are diffusely osteopenic. There are acute fractures of the left ischium with minimal displacement of the inferior ischial fracture fragment comprising the origin of the hamstring musculature. Additionally, there is a a minimally displaced fracture of the left sacral ala extending to the caudal sacroiliac joint with minimal displacement of the fracture fragment. Advanced degenerative changes are seen within the lumbar spine. Advanced degenerative changes are seen within the hips bilaterally. No dislocation. IMPRESSION: Acute, minimally displaced fractures of the left ischium and left sacral ala. Femoral heads are still seated within the acetabula bilaterally. Superimposed severe  degenerative change within the lumbar spine and hips bilaterally. Interstitial and intramuscular hemorrhage within the left pelvic sidewall and left operator internus. 2.7 cm incompletely evaluated simple appearing cyst within the right ovary. Further follow-up is not required given its size. Bladder wall thickening and perivesicular inflammatory stranding suggesting a diffuse infectious or inflammatory cystitis. Correlation with urinalysis and urine culture may be helpful. Minimal nonobstructing right nephrolithiasis. Aortic Atherosclerosis (ICD10-I70.0). Electronically Signed   By: Fidela Salisbury MD   On: 06/16/2020 20:29   DG Hips Bilat W or Wo Pelvis 3-4 Views  Result Date: 06/16/2020 CLINICAL DATA:  86 year old female with fall and bilateral hip pain. EXAM: DG HIP (WITH OR WITHOUT PELVIS) 3-4V BILAT COMPARISON:  Left hip radiograph dated 06/09/2017. FINDINGS: Evaluation for fracture is very limited due to advanced osteopenia. There is faint linear lucency through the left ischial tuberosity and left inferior pubic ramus which may be artifactual. Nondisplaced fracture is not excluded. Correlation with point  tenderness recommended. No other acute fracture identified. There is no dislocation. Prior ORIF of the left femoral fracture. The hardware is intact. The soft tissues are unremarkable. Vascular calcifications noted. IMPRESSION: 1. Artifact versus less likely nondisplaced fracture of the left ischial tuberosity and left inferior pubic ramus. Correlation with point tenderness recommended. 2. No other acute fracture identified. No dislocation. 3. Advanced osteopenia. Electronically Signed   By: Anner Crete M.D.   On: 06/16/2020 16:59       Assessment & Plan:    Active Problems:   Vascular dementia, due to brain bleed from elevated INR, not due to ischemic strokes.    Essential hypertension   Chronic anticoagulation   S/P mitral valve replacement   Hypothyroidism   Chronic atrial fibrillation (HCC)   Dementia, multi-infarct (HCC)   Cardioembolic stroke (HCC)   P53 deficiency   Frequent falls   Closed fracture of multiple pubic rami, left, initial encounter [facility])   Fall with nondisplaced left sacral and pubic rami fracture -Patient with history of multiple falls in the past and fractures on multiple admissions, giving her age, frailty, history of CVA and severe deconditioning. -Ortho input appreciated, Dr. Doreatha Martin note was reviewed, commendation is for nonoperative treatment, patient may be weightbearing as tolerated on bilateral lower extremities, so she will be admitted to [facility], will consult PT/OT, likely will need placement. -We will monitor CBC closely given evidence of surrounding small hematoma, on this patient was on warfarin and Plavix.  Chronic anticoagulation secondary to A. fib and mechanical mitral valve replacement - continue with warfarin, pharmacy to dose.  UTI/hematuria - monitor closely as she is on anticoagulation, continue with Rocephin for UTI, follow on urine cultures.  Atrial fibrillation -Continue with atenolol for heart rate control -On  warfarin.  Mechanical valve replacement -Continue with warfarin with target INR 2.5-3.5  Hypothyroidism -Continue with Synthroid  Hypertension -Continue with lisinopril and atenolol   DVT Prophylaxis on warfarin  AM Labs Ordered, also please review Full Orders  Family Communication: Admission, patients condition and plan of care including tests being ordered have been discussed with the patient and son at bedside who indicate understanding and agree with the plan and Code Status.  Code Status DNR  Likely DC to  SNF  Condition GUARDED    Consults called: ED D/W ortho    Admission status: Observation    Time spent in minutes : 55 minutes   Phillips Climes M.D on 06/16/2020 at 10:09 PM   [facility] - Office  [phone]

## 2020-06-16 NOTE — ED Notes (Signed)
Pt remains alert and appears in no distress   Will continue to attempt to gain accurate Ox sat

## 2020-06-16 NOTE — ED Notes (Signed)
Pt brief changed and pt placed on purewick. Blood noted in pt's diaper, pt son says pt has a cyst in vagina that bleeds sometimes. RN made aware.

## 2020-06-16 NOTE — ED Notes (Signed)
Pt from CT

## 2020-06-16 NOTE — ED Notes (Signed)
In and out cath with return of dark, foul odor urine  Pt leaned of feces and diaper changed   Pt resting without increased work of breathing nor distress noted

## 2020-06-16 NOTE — Progress Notes (Signed)
Ortho note  Reviewed imaging after discussing case with Dr. Laverta Baltimore. 86 yo w/ nondisplaced left sacral and pubic rami fractures. These can be treated nonoperatively. Patient may be WBAT BLE. She may be admitted to [facility] for physical therapy. No need for transfer.  Shona Needles, MD [facility] [phone] (office) [email or web address]

## 2020-06-16 NOTE — ED Notes (Signed)
Pt hands remain cold to touch   Pulse ox changed   [place] to pt with O2/2.5 L

## 2020-06-16 NOTE — ED Notes (Signed)
Lab in to draw

## 2020-06-16 NOTE — Progress Notes (Signed)
ANTICOAGULATION CONSULT NOTE - Initial Consult  Pharmacy Consult for Warfarin Indication: PAF, MVR  Allergies  Allergen Reactions  . Penicillins Swelling    Has patient had a PCN reaction causing immediate rash, facial/tongue/throat swelling, SOB or lightheadedness with hypotension: Yes Has patient had a PCN reaction causing severe rash involving mucus membranes or skin necrosis: No Has patient had a PCN reaction that required hospitalization No Has patient had a PCN reaction occurring within the last 10 years: No If all of the above answers are "NO", then may proceed with Cephalosporin use. Whole body "swelled up" and had to go to the hospital    Patient Measurements: Height: 5\' 3"  (160 cm) Weight: 49.9 kg (110 lb) IBW/kg (Calculated) : 52.4  Vital Signs: Temp: 99.8 F (37.7 C) (11/12 1955) Temp Source: Oral (11/12 1955) BP: 142/77 (11/12 2030) Pulse Rate: 73 (11/12 2030)  Labs: Recent Labs    06/16/20 1650  HGB 11.9*  HCT 38.5  PLT 162  LABPROT 28.8*  INR 2.8*  CREATININE 0.78    Estimated Creatinine Clearance: 37.6 mL/min (by C-G formula based on SCr of 0.78 mg/dL).   Medical History: Past Medical History:  Diagnosis Date  . Biatrial enlargement 12/01/2015  . Chronic anticoagulation    coumadin, CHADS2VASC=6  . History of mitral valve replacement   . Hypertension   . Hypothyroidism   . Osteoporosis    on Boniva  . Permanent atrial fibrillation ([place])   . Pulmonary hypertension, moderate to severe ([place]) 12/01/2015  . Stroke [facility])    on Clopidrigel  . UTI (lower urinary tract infection) 07/2015   n&v; AKI  . Vascular dementia ([place])     Medications:  See electronic med rec  Assessment: 86 y.o. F presents s/p fall.  Pt on warfarin PTA for afib and MVR. Admission INR 2.8. CBC ok on admission. Home dose: Warfarin 4mg  M/W/F and 3mg  all other days  Goal of Therapy:   INR 2.5-3.5 Monitor platelets by anticoagulation protocol: Yes   Plan:  Daily  INR Restart home warfarin 4mg  M/W/F and 3mg  all other days  Sherlon Handing, PharmD, BCPS Please see amion for complete clinical pharmacist phone list 06/16/2020,10:10 PM

## 2020-06-16 NOTE — ED Notes (Signed)
Pt son asks if pt might have something to drink  Informed that pt is going to CT  And dependant on result can get something to drink when she returns

## 2020-06-16 NOTE — ED Notes (Signed)
Pt sats are decreased  Unable to put ear probe on pt as there are no connections  Pt hands are chilled and warm blanket applied

## 2020-06-16 NOTE — ED Provider Notes (Signed)
[facility] EMERGENCY DEPARTMENT Provider Note   CSN: [id number] Arrival date & time: 06/16/20  1532     History Chief Complaint  Patient presents with  . Fall    Courtney [name] is a 86 y.o. female with past medical history significant for permanent A. fib, mitral valve replacement, chronic anticoagulation on Coumadin, pulmonary hypertension, CVA, vascular dementia.  Son is at the bedside and contributes majority of history.  Tetanus is up-to-date.  HPI Patient presents to emergency department today with chief complaint of fall happening 1 hour prior to arrival.  Son states that patient was standing near the door with her walker.  He was coming inside from taking out the trash and saw patient lose her balance and started to fall.  He tried to catch her however was unable to.  Patient landed on her bottom and then fell backwards.  He denies being her hit her head or loss of consciousness.  He scooped patient up from the fall and sit on the couch.  Patient was saying she was in pain but unable to tell where it was.  He brought her to the emergency department for evaluation.  Patient has not been ambulatory since the fall.  Of note he also states patient possibly has urinary tract infection he is been treating with cranberry pills.    Past Medical History:  Diagnosis Date  . Biatrial enlargement 12/01/2015  . Chronic anticoagulation    coumadin, CHADS2VASC=6  . History of mitral valve replacement   . Hypertension   . Hypothyroidism   . Osteoporosis    on Boniva  . Permanent atrial fibrillation ([place])   . Pulmonary hypertension, moderate to severe ([place]) 12/01/2015  . Stroke [facility])    on Clopidrigel  . UTI (lower urinary tract infection) 07/2015   n&v; AKI  . Vascular dementia [facility])     Patient Active Problem List   Diagnosis Date Noted  . Frequent falls 05/14/2018  . Hypercalcemia 05/14/2018  . Closed fracture of right proximal humerus 05/14/2018  . Periorbital hematoma of right eye  05/14/2018  . Encounter for therapeutic drug monitoring 03/11/2018  . T12 compression fracture ([place]) 04/22/2017  . Dementia, multi-infarct ([place]) 04/22/2017  . Cardioembolic stroke ([place]) 68/06/5725  . B12 deficiency 04/22/2017  . Cough 12/16/2015  . Osteoporosis 12/05/2015  . Biatrial enlargement 12/01/2015  . Pulmonary hypertension, moderate to severe ([place]) 12/01/2015  . Closed fracture of intertrochanteric section of femur ([place])   . Chronic atrial fibrillation ([place]) 11/30/2015  . Hypothyroidism 11/29/2015  . Thrombocytopenia ([place]) 07/13/2015  . Chronic anticoagulation 07/13/2015  . S/P mitral valve replacement 07/13/2015  . Vascular dementia, due to brain bleed from elevated INR, not due to ischemic strokes.  07/12/2015  . Essential hypertension 07/12/2015    Past Surgical History:  Procedure Laterality Date  . ABDOMINAL HYSTERECTOMY    . FRACTURE SURGERY  [id number]   Operative repair with intramedullary nail intratrochanteric Left Hip fracture  . INTRAMEDULLARY (IM) NAIL INTERTROCHANTERIC Left 12/01/2015   Procedure: INTRAMEDULLARY (IM) NAIL INTERTROCHANTRIC;  Surgeon: Carole Civil, MD;  Location: [facility];  Service: Orthopedics;  Laterality: Left;  . MITRAL VALVE REPLACEMENT       OB History    Gravida  3   Para  2   Term  2   Preterm      AB  1   Living  1     SAB  1   TAB      Ectopic  Multiple      Live Births  2           Family History  Problem Relation Age of Onset  . Heart disease Mother   . Heart disease Father   . Suicidality Son   . Stroke Neg Hx   . Cancer Neg Hx   . Diabetes Neg Hx     Social History   Tobacco Use  . Smoking status: Former Smoker    Types: Cigarettes  . Smokeless tobacco: Never Used  Vaping Use  . Vaping Use: Never used  Substance Use Topics  . Alcohol use: No  . Drug use: No    Home Medications Prior to Admission medications   Medication Sig Start Date End Date Taking? Authorizing Provider    acetaminophen (TYLENOL) 500 MG tablet Take 500 mg by mouth every 6 (six) hours as needed for mild pain.   Yes [provider]  alendronate (FOSAMAX) 70 MG tablet TAKE 1 TABLET BY MOUTH EVERY WEDNESDAY Patient taking differently: Take 70 mg by mouth once a week.  09/01/19  Yes Gosrani, Nimish C, MD  atenolol (TENORMIN) 25 MG tablet Take 12.5 mg by mouth daily.   Yes [provider]  b complex vitamins tablet Take 1 tablet by mouth daily.   Yes [provider]  clopidogrel (PLAVIX) 75 MG tablet TAKE 1 TABLET BY MOUTH EVERY DAY 03/22/20  Yes Ailene Ards, NP  levothyroxine (SYNTHROID) 100 MCG tablet TAKE 1 TABLET BY MOUTH EVERY DAY BEFORE BREAKFAST Patient taking differently: Take 100 mcg by mouth daily before breakfast.  06/05/20  Yes Ailene Ards, NP  lisinopril (ZESTRIL) 2.5 MG tablet Take 1 tablet (2.5 mg total) by mouth daily. 01/11/20  Yes Ailene Ards, NP  vitamin C (ASCORBIC ACID) 250 MG tablet Take 250 mg by mouth daily.    Yes [provider]  warfarin (COUMADIN) 2 MG tablet TAKE 1 TABLET BY MOUTH EVERY DAY 01/08/20  Yes Anastasio Champion, Nimish C, MD  warfarin (COUMADIN) 3 MG tablet Take 6mg  tonight then resume 3mg  daily except 4mg  on Wednesdays 01/20/20  Yes Josue Hector, MD  VITAMIN D, ERGOCALCIFEROL, PO Take 250 mg by mouth. Three times a week Patient not taking: Reported on 06/16/2020    [provider]  alendronate (FOSAMAX) 70 MG tablet once a week.  05/29/18   [provider]  amLODipine (NORVASC) 2.5 MG tablet Take 1 tablet (2.5 mg total) by mouth daily. 07/06/19   Doree Albee, MD  atenolol (TENORMIN) 25 MG tablet Take 0.5 tablets (12.5 mg total) by mouth daily. 02/10/18   Caren Macadam, MD  clopidogrel (PLAVIX) 75 MG tablet Take 1 tablet (75 mg total) by mouth daily. 02/10/18   Caren Macadam, MD  clopidogrel (PLAVIX) 75 MG tablet Take 1 tablet (75 mg total) by mouth daily. 06/20/19   Doree Albee, MD  clopidogrel (PLAVIX) 75 MG  tablet TAKE 1 TABLET BY MOUTH EVERY DAY 10/14/19   Hurshel Party C, MD  warfarin (COUMADIN) 2 MG tablet Take 1 tablet (2 mg total) by mouth daily. 04/27/19   Doree Albee, MD  warfarin (COUMADIN) 2 MG tablet TAKE 1 TABLET BY MOUTH EVERY DAY 07/22/19   Doree Albee, MD    Allergies    Penicillins  Review of Systems   Review of Systems  Unable to perform ROS: Dementia    Physical Exam Updated Vital Signs BP (!) 124/107 (BP Location: Right Arm)   Temp  98.6 F (37 C) (Oral)   Resp (!) 24   Ht 5\' 3"  (1.6 m)   Wt 49.9 kg   SpO2 90%   BMI 19.49 kg/m   Physical Exam Vitals and nursing note reviewed.  Constitutional:      General: She is not in acute distress.    Appearance: She is not ill-appearing.  HENT:     Head: Normocephalic and atraumatic.     Comments: No tenderness to palpation of skull. No deformities or crepitus noted. No open wounds, abrasions or lacerations.    Right Ear: Tympanic membrane and external ear normal.     Left Ear: Tympanic membrane and external ear normal.     Nose: Nose normal.     Mouth/Throat:     Mouth: Mucous membranes are moist.     Pharynx: Oropharynx is clear.  Eyes:     General: No scleral icterus.       Right eye: No discharge.        Left eye: No discharge.     Extraocular Movements: Extraocular movements intact.     Conjunctiva/sclera: Conjunctivae normal.     Pupils: Pupils are equal, round, and reactive to light.  Neck:     Vascular: No JVD.     Comments: Full ROM intact without spinous process TTP. No bony stepoffs or deformities,no paraspinous muscle TTP or muscle spasms. No bruising, erythema, or swelling.  Cardiovascular:     Rate and Rhythm: Normal rate. Rhythm irregular.     Pulses: Normal pulses.          Radial pulses are 2+ on the right side and 2+ on the left side.     Heart sounds: Normal heart sounds.  Pulmonary:     Comments: Lungs clear to auscultation in all fields. Symmetric chest rise. No wheezing,  rales, or rhonchi.  Oxygen saturation is 90% on room air.  She has normal work of breathing. Abdominal:     Comments: Abdomen is soft, non-distended, and non-tender in all quadrants. No rigidity, no guarding. No peritoneal signs.  Musculoskeletal:     Cervical back: Normal range of motion.     Comments: No tenderness to palpation of bilateral upper extremities.  Compartments are soft.  Full range of motion of the T-spine and L-spine No tenderness to palpation of the spinous processes of the T-spine or L-spine No crepitus, deformity or step-offs No tenderness to palpation of the paraspinous muscles of the L-spine   No cervical, thoracic, or lumbar spinal tenderness to palpation. No paraspinal tenderness. No step offs, crepitus or deformity palpated.   No tender to palpation of bilateral hips.  Pelvis is stable.  No obvious deformity.  No leg length discrepancy.  Full passive range of motion of bilateral knees.  Skin:    General: Skin is warm and dry.     Capillary Refill: Capillary refill takes less than 2 seconds.     Comments: 2 cm x 2 cm L shaped skin tear on right bicep. Bleeding controlled with pressure.  Neurological:     Mental Status: Mental status is at baseline.     GCS: GCS eye subscore is 4. GCS verbal subscore is 5. GCS motor subscore is 6.     Comments: Fluent speech, no facial droop.  Psychiatric:        Behavior: Behavior normal.     Comments: Pleasantly demented     ED Results / Procedures / Treatments   Labs (all labs ordered are listed,  but only abnormal results are displayed) Labs Reviewed  COMPREHENSIVE METABOLIC PANEL - Abnormal; Notable for the following components:      Result Value   Glucose, Bld 103 (*)    All other components within normal limits  CBC WITH DIFFERENTIAL/PLATELET - Abnormal; Notable for the following components:   Hemoglobin 11.9 (*)    Abs Immature Granulocytes 0.12 (*)    All other components within normal limits  PROTIME-INR -  Abnormal; Notable for the following components:   Prothrombin Time 28.8 (*)    INR 2.8 (*)    All other components within normal limits  URINALYSIS, ROUTINE W REFLEX MICROSCOPIC - Abnormal; Notable for the following components:   Color, Urine RED (*)    APPearance TURBID (*)    Glucose, UA   (*)    Value: TEST NOT REPORTED DUE TO COLOR INTERFERENCE OF URINE PIGMENT   Hgb urine dipstick   (*)    Value: TEST NOT REPORTED DUE TO COLOR INTERFERENCE OF URINE PIGMENT   Bilirubin Urine   (*)    Value: TEST NOT REPORTED DUE TO COLOR INTERFERENCE OF URINE PIGMENT   Ketones, ur   (*)    Value: TEST NOT REPORTED DUE TO COLOR INTERFERENCE OF URINE PIGMENT   Protein, ur   (*)    Value: TEST NOT REPORTED DUE TO COLOR INTERFERENCE OF URINE PIGMENT   Nitrite   (*)    Value: TEST NOT REPORTED DUE TO COLOR INTERFERENCE OF URINE PIGMENT   Leukocytes,Ua   (*)    Value: TEST NOT REPORTED DUE TO COLOR INTERFERENCE OF URINE PIGMENT   All other components within normal limits  URINALYSIS, MICROSCOPIC (REFLEX) - Abnormal; Notable for the following components:   Bacteria, UA MANY (*)    All other components within normal limits    EKG None  Radiology DG Chest 2 View  Result Date: 06/16/2020 CLINICAL DATA:  [place], bilateral hip pain EXAM: CHEST - 2 VIEW COMPARISON:  05/29/2017 FINDINGS: Frontal and lateral views of the chest demonstrate an enlarged cardiac silhouette. Postsurgical changes from median sternotomy and mitral valve replacement. Background scarring again noted without focal consolidation, effusion, or pneumothorax. No acute bony abnormalities. IMPRESSION: 1. Stable exam, no acute process. Electronically Signed   By: Randa Ngo M.D.   On: 06/16/2020 16:55   CT Head Wo Contrast  Result Date: 06/16/2020 CLINICAL DATA:  [place], hit head, hypertension EXAM: CT HEAD WITHOUT CONTRAST TECHNIQUE: Contiguous axial images were obtained from the base of the skull through the vertex without intravenous  contrast. COMPARISON:  05/13/2018 FINDINGS: Brain: Scattered areas of chronic small vessel ischemic changes are seen throughout the periventricular white matter, stable. Chronic encephalomalacia right frontal lobe from prior cortical infarct. No acute infarct or hemorrhage. Lateral ventricles and midline structures are unremarkable. No acute extra-axial fluid collections. No mass effect. Vascular: No hyperdense vessel or unexpected calcification. Skull: Normal. Negative for fracture or focal lesion. Sinuses/Orbits: No acute finding. Other: None. IMPRESSION: 1. Chronic ischemic changes as above, no acute intracranial process. Electronically Signed   By: Randa Ngo M.D.   On: 06/16/2020 18:54   CT Cervical Spine Wo Contrast  Result Date: 06/16/2020 CLINICAL DATA:  [place], hypertension EXAM: CT CERVICAL SPINE WITHOUT CONTRAST TECHNIQUE: Multidetector CT imaging of the cervical spine was performed without intravenous contrast. Multiplanar CT image reconstructions were also generated. COMPARISON:  05/13/2018 FINDINGS: Alignment: Alignment is anatomic. Skull base and vertebrae: No acute fracture. No primary bone lesion or focal pathologic process.  Soft tissues and spinal canal: No prevertebral fluid or swelling. No visible canal hematoma. Disc levels: Extensive multilevel spondylosis and facet hypertrophy is unchanged. Bony fusion across the C2/C3 facet joints. Upper chest: Airway is patent.  Lung apices are clear. Other: Reconstructed images demonstrate no additional findings. IMPRESSION: 1. No acute cervical spine fracture. Stable multilevel degenerative changes. Electronically Signed   By: Randa Ngo M.D.   On: 06/16/2020 18:57   DG Hips Bilat W or Wo Pelvis 3-4 Views  Result Date: 06/16/2020 CLINICAL DATA:  86 year old female with fall and bilateral hip pain. EXAM: DG HIP (WITH OR WITHOUT PELVIS) 3-4V BILAT COMPARISON:  Left hip radiograph dated 06/09/2017. FINDINGS: Evaluation for fracture is very  limited due to advanced osteopenia. There is faint linear lucency through the left ischial tuberosity and left inferior pubic ramus which may be artifactual. Nondisplaced fracture is not excluded. Correlation with point tenderness recommended. No other acute fracture identified. There is no dislocation. Prior ORIF of the left femoral fracture. The hardware is intact. The soft tissues are unremarkable. Vascular calcifications noted. IMPRESSION: 1. Artifact versus less likely nondisplaced fracture of the left ischial tuberosity and left inferior pubic ramus. Correlation with point tenderness recommended. 2. No other acute fracture identified. No dislocation. 3. Advanced osteopenia. Electronically Signed   By: Anner Crete M.D.   On: 06/16/2020 16:59    Procedures Procedures (including critical care time)  Medications Ordered in ED Medications - No data to display  ED Course  I have reviewed the triage vital signs and the nursing notes.  Pertinent labs & imaging results that were available during my care of the patient were reviewed by me and considered in my medical decision making (see chart for details).    MDM Rules/Calculators/A&P                          History provided by patient with additional history obtained from chart review.    86 year old female presenting after mechanical fall witnessed by her son.  She is pleasantly demented.  On arrival she is afebrile, normotensive.  During exam she has oxygen saturation in the low 90s.  Does not wear oxygen at home.  Lung sounds clear to auscultation all fields and she has normal work of breathing.  Patient difficult to initially get O2 sat on secondary to cold fingertips.  After fingertips were warmed able to get read in the upper 80s.  Patient placed on 2 L nasal cannula with improvement to 95%.  Is difficult historian secondary to dementia.  She has a skin tear on her right bicep with bleeding controlled.  Palpated patient from head to toe  and she denies any pain.  She did not withdraw or grimace during the exam. Wound care provided, tetanus is up-to-date.  Basic labs checked no leukocytosis, hemoglobin consistent with baseline.  CMP overall unremarkable.  INR is 2.8, patient is on Coumadin.  UA performed by cath specimen and is red and turbid.  Testing was unable to be performed because of pigment, was noted there over 50 RBCs, only 6-10 WBC and many bacteria. CT head and cervical spine negative for any acute findings.  No signs of a head bleed, no fractures seen. I viewed pt's chest xray and it does not suggest acute infectious processes.  X-ray of pelvis and hips shows possible nondisplaced fracture of left ischial tuberosity and left inferior pubic ramus.  Unable to correlate with clinical exam as patient is  demented and very stoic in appearance.  CT pelvis ordered for further evaluation of possible fracture. Patient care transferred to ED attending Dr. Laverta Baltimore at the end of my shift pending CT pelvis and reassessment. Patient will need to be off oxygen without hypoxia and ambulated if planning to dc home. Patient presentation, ED course, and plan of care discussed with review of all pertinent labs and imaging. Please see his note for further details regarding further ED course and disposition.   Portions of this note were generated with [employer]. Dictation errors may occur despite best attempts at proofreading.   Final Clinical Impression(s) / ED Diagnoses Final diagnoses:  Fall, initial encounter    Rx / DC Orders ED Discharge Orders    None       Lewanda Rife 06/16/20 1925    Margette Fast, MD 06/17/20 1247

## 2020-06-16 NOTE — ED Notes (Signed)
Cleaned skin tears on bilateral arms, steri strips placed, dressings placed.  Pt tolerated well.

## 2020-06-16 NOTE — ED Triage Notes (Signed)
Pt fell today at home. Skin tears to both arms. Denies hitting head.

## 2020-06-16 NOTE — ED Notes (Signed)
Son is historian  Pt lives with son  [place] at home   Has happened in past with result of several strokes, spine fx etc per son   Pt with 2 skin tears that are triangle shaped approx 2 inches to L and R lateral upper arm  Pt son denies that it was done assisting pt up from floor rather it was fall injuries   PA has assessed   Pt appears alert

## 2020-06-17 DIAGNOSIS — I482 Chronic atrial fibrillation, unspecified: Secondary | ICD-10-CM

## 2020-06-17 DIAGNOSIS — I69311 Memory deficit following cerebral infarction: Secondary | ICD-10-CM | POA: Diagnosis not present

## 2020-06-17 DIAGNOSIS — I4821 Permanent atrial fibrillation: Secondary | ICD-10-CM | POA: Diagnosis present

## 2020-06-17 DIAGNOSIS — N390 Urinary tract infection, site not specified: Secondary | ICD-10-CM | POA: Diagnosis present

## 2020-06-17 DIAGNOSIS — E538 Deficiency of other specified B group vitamins: Secondary | ICD-10-CM | POA: Diagnosis present

## 2020-06-17 DIAGNOSIS — E039 Hypothyroidism, unspecified: Secondary | ICD-10-CM

## 2020-06-17 DIAGNOSIS — Z515 Encounter for palliative care: Secondary | ICD-10-CM | POA: Diagnosis not present

## 2020-06-17 DIAGNOSIS — Z7901 Long term (current) use of anticoagulants: Secondary | ICD-10-CM | POA: Diagnosis not present

## 2020-06-17 DIAGNOSIS — R54 Age-related physical debility: Secondary | ICD-10-CM | POA: Diagnosis present

## 2020-06-17 DIAGNOSIS — Z9181 History of falling: Secondary | ICD-10-CM | POA: Diagnosis not present

## 2020-06-17 DIAGNOSIS — Z952 Presence of prosthetic heart valve: Secondary | ICD-10-CM

## 2020-06-17 DIAGNOSIS — W1830XA Fall on same level, unspecified, initial encounter: Secondary | ICD-10-CM | POA: Diagnosis present

## 2020-06-17 DIAGNOSIS — R296 Repeated falls: Secondary | ICD-10-CM | POA: Diagnosis present

## 2020-06-17 DIAGNOSIS — Z20822 Contact with and (suspected) exposure to covid-19: Secondary | ICD-10-CM | POA: Diagnosis present

## 2020-06-17 DIAGNOSIS — I10 Essential (primary) hypertension: Secondary | ICD-10-CM | POA: Diagnosis present

## 2020-06-17 DIAGNOSIS — Z7189 Other specified counseling: Secondary | ICD-10-CM | POA: Diagnosis not present

## 2020-06-17 DIAGNOSIS — Z681 Body mass index (BMI) 19 or less, adult: Secondary | ICD-10-CM | POA: Diagnosis not present

## 2020-06-17 DIAGNOSIS — D62 Acute posthemorrhagic anemia: Secondary | ICD-10-CM | POA: Diagnosis present

## 2020-06-17 DIAGNOSIS — F0150 Vascular dementia without behavioral disturbance: Secondary | ICD-10-CM | POA: Diagnosis present

## 2020-06-17 DIAGNOSIS — I272 Pulmonary hypertension, unspecified: Secondary | ICD-10-CM | POA: Diagnosis present

## 2020-06-17 DIAGNOSIS — S32602A Unspecified fracture of left ischium, initial encounter for closed fracture: Secondary | ICD-10-CM | POA: Diagnosis present

## 2020-06-17 DIAGNOSIS — I639 Cerebral infarction, unspecified: Secondary | ICD-10-CM

## 2020-06-17 DIAGNOSIS — R791 Abnormal coagulation profile: Secondary | ICD-10-CM | POA: Diagnosis present

## 2020-06-17 DIAGNOSIS — Z66 Do not resuscitate: Secondary | ICD-10-CM | POA: Diagnosis present

## 2020-06-17 DIAGNOSIS — G9341 Metabolic encephalopathy: Secondary | ICD-10-CM | POA: Diagnosis present

## 2020-06-17 DIAGNOSIS — D696 Thrombocytopenia, unspecified: Secondary | ICD-10-CM | POA: Diagnosis present

## 2020-06-17 DIAGNOSIS — M808AXA Other osteoporosis with current pathological fracture, other site, initial encounter for fracture: Secondary | ICD-10-CM | POA: Diagnosis present

## 2020-06-17 DIAGNOSIS — S32592A Other specified fracture of left pubis, initial encounter for closed fracture: Secondary | ICD-10-CM | POA: Diagnosis present

## 2020-06-17 LAB — BASIC METABOLIC PANEL
Anion gap: 8 (ref 5–15)
BUN: 22 mg/dL (ref 8–23)
CO2: 26 mmol/L (ref 22–32)
Calcium: 9.2 mg/dL (ref 8.9–10.3)
Chloride: 104 mmol/L (ref 98–111)
Creatinine, Ser: 0.89 mg/dL (ref 0.44–1.00)
GFR, Estimated: >60 mL/min (ref >60)
Glucose, Bld: 113 mg/dL — ABNORMAL HIGH (ref 70–99)
Potassium: 4.2 mmol/L (ref 3.5–5.1)
Sodium: 138 mmol/L (ref 135–145)

## 2020-06-17 LAB — CBC
HCT: 35.1 % — ABNORMAL LOW (ref 36.0–46.0)
Hemoglobin: 10.9 g/dL — ABNORMAL LOW (ref 12.0–15.0)
MCH: 28.6 pg (ref 26.0–34.0)
MCHC: 31.1 g/dL (ref 30.0–36.0)
MCV: 92.1 fL (ref 80.0–100.0)
Platelets: 120 10*3/uL — ABNORMAL LOW (ref 150–400)
RBC: 3.81 MIL/uL — ABNORMAL LOW (ref 3.87–5.11)
RDW: 15.1 % (ref 11.5–15.5)
WBC: 6.1 10*3/uL (ref 4.0–10.5)
nRBC: 0 % (ref 0.0–0.2)

## 2020-06-17 LAB — PROTIME-INR
INR: 3.1 — ABNORMAL HIGH (ref 0.8–1.2)
Prothrombin Time: 30.6 seconds — ABNORMAL HIGH (ref 11.4–15.2)

## 2020-06-17 MED ORDER — LEVOTHYROXINE SODIUM 100 MCG PO TABS
100.0000 ug | ORAL_TABLET | Freq: Every day | ORAL | Status: DC
  Administered 2020-06-18 – 2020-06-20 (×3): 100 ug via ORAL
  Filled 2020-06-17 (×3): qty 1

## 2020-06-17 MED ORDER — LABETALOL HCL 5 MG/ML IV SOLN
5.0000 mg | INTRAVENOUS | Status: DC | PRN
  Administered 2020-06-19: 5 mg via INTRAVENOUS
  Filled 2020-06-17: qty 4

## 2020-06-17 NOTE — Evaluation (Signed)
Physical Therapy Evaluation Patient Details Name: Courtney [name] MRN: [id number] DOB: April 17, 1929 Today's Date: 06/17/2020   History of Present Illness   Courtney [name]  is a 86 y.o. female, with past medical history significant for permanent A. fib, mitral valve replacement, chronic anticoagulation on Coumadin, pulmonary hypertension, CVA, vascular dementia, patient presents to ED secondary to fall, at baseline patient with poor balance, ambulates mainly with walker, she is with history of multiple falls in the past, she is on warfarin for mechanical heart valve, patient had mechanical fall this morning, no dizziness, no lightheadedness, no head trauma, she lost her balance while on the walker, son tried to catch her, he was unable to, patient landed on her bottom, and then fell backward, patient told son she was in pain, but was unable to locate where is the pain, so he brought her to ED for further evaluation.    Clinical Impression  Patient limited for functional mobility as stated below secondary to BLE weakness, fatigue and poor sitting balance. Patient appears to be a poor historian. Patient requires mod assist to transition to seated and assist to remain seated secondary to impaired strength and balance. She c/o pain but unable to state where and c/o weakness throughout session. Patient with limited sitting tolerance today and requires assist back to supine.  Patient will benefit from continued physical therapy in hospital and recommended venue below to increase strength, balance, endurance for safe ADLs and gait.     Follow Up Recommendations SNF    Equipment Recommendations  None recommended by PT    Recommendations for Other Services       Precautions / Restrictions Precautions Precautions: Fall Restrictions Weight Bearing Restrictions: Yes RLE Weight Bearing: Weight bearing as tolerated LLE Weight Bearing: Weight bearing as tolerated      Mobility  Bed Mobility Overal bed mobility:  Needs Assistance Bed Mobility: Supine to Sit;Sit to Supine     Supine to sit: Mod assist;HOB elevated Sit to supine: Min assist        Transfers                    Ambulation/Gait                Stairs            Wheelchair Mobility    Modified Rankin (Stroke Patients Only)       Balance Overall balance assessment: Needs assistance Sitting-balance support: Bilateral upper extremity supported Sitting balance-Leahy Scale: Poor Sitting balance - Comments: seated EOB                                     Pertinent Vitals/Pain Pain Assessment: No/denies pain    Home Living Family/patient expects to be discharged to:: Private residence Living Arrangements: Children Available Help at Discharge: Family Type of Home: House Home Access: Stairs to enter;Ramped entrance     Home Layout: Two level;Able to live on main level with bedroom/bathroom   Additional Comments: Patient is a poor historian of home set up and PLOF    Prior Function Level of Independence: Needs assistance   Gait / Transfers Assistance Needed: Patient states she ambulates without AD  ADL's / Homemaking Assistance Needed: son assists  Comments: appears to be poor historian     Hand Dominance        Extremity/Trunk Assessment   Upper Extremity Assessment Upper Extremity Assessment: Overall  WFL for tasks assessed    Lower Extremity Assessment Lower Extremity Assessment: Overall WFL for tasks assessed    Cervical / Trunk Assessment Cervical / Trunk Assessment: Normal  Communication   Communication: HOH  Cognition Arousal/Alertness: Awake/alert Behavior During Therapy: WFL for tasks assessed/performed Overall Cognitive Status: Within Functional Limits for tasks assessed                                        General Comments General comments (skin integrity, edema, etc.): LE wounds and bandages    Exercises     Assessment/Plan     PT Assessment Patient needs continued PT services  PT Problem List Decreased strength;Decreased activity tolerance;Decreased balance;Decreased mobility;Decreased cognition;Decreased knowledge of use of DME;Decreased safety awareness;Pain       PT Treatment Interventions DME instruction;Gait training;Stair training;Functional mobility training;Therapeutic activities;Therapeutic exercise;Balance training;Neuromuscular re-education;Patient/family education;Wheelchair mobility training;Manual techniques    PT Goals (Current goals can be found in the Care Plan section)  Acute Rehab PT Goals Patient Stated Goal: Return home PT Goal Formulation: With patient Time For Goal Achievement: 07/01/20 Potential to Achieve Goals: Fair    Frequency Min 3X/week   Barriers to discharge        Co-evaluation               AM-PAC PT "6 Clicks" Mobility  Outcome Measure Help needed turning from your back to your side while in a flat bed without using bedrails?: A Lot Help needed moving from lying on your back to sitting on the side of a flat bed without using bedrails?: A Lot Help needed moving to and from a bed to a chair (including a wheelchair)?: A Lot Help needed standing up from a chair using your arms (e.g., wheelchair or bedside chair)?: A Lot Help needed to walk in hospital room?: Total Help needed climbing 3-5 steps with a railing? : Total 6 Click Score: 10    End of Session Equipment Utilized During Treatment: Oxygen Activity Tolerance: Patient limited by pain;Patient limited by fatigue Patient left: in bed;with call bell/phone within reach   PT Visit Diagnosis: Unsteadiness on feet (R26.81);Other abnormalities of gait and mobility (R26.89);Muscle weakness (generalized) (M62.81);History of falling (Z91.81)    Time: 1610-9604 PT Time Calculation (min) (ACUTE ONLY): 9 min   Charges:   PT Evaluation $PT Eval Moderate Complexity: 1 Mod          11:20 AM, 06/17/20 Mearl Latin PT, DPT Physical Therapist at [facility]

## 2020-06-17 NOTE — Progress Notes (Signed)
ANTICOAGULATION CONSULT NOTE -   Pharmacy Consult for Warfarin Indication: PAF, MVR  Allergies  Allergen Reactions  . Penicillins Swelling    Has patient had a PCN reaction causing immediate rash, facial/tongue/throat swelling, SOB or lightheadedness with hypotension: Yes Has patient had a PCN reaction causing severe rash involving mucus membranes or skin necrosis: No Has patient had a PCN reaction that required hospitalization No Has patient had a PCN reaction occurring within the last 10 years: No If all of the above answers are "NO", then may proceed with Cephalosporin use. Whole body "swelled up" and had to go to the hospital    Patient Measurements: Height: 5\' 3"  (160 cm) Weight: 49.9 kg (110 lb) IBW/kg (Calculated) : 52.4  Vital Signs: BP: 149/83 (11/13 0738) Pulse Rate: 69 (11/13 0738)  Labs: Recent Labs    06/16/20 1650 06/17/20 0351  HGB 11.9* 10.9*  HCT 38.5 35.1*  PLT 162 120*  LABPROT 28.8* 30.6*  INR 2.8* 3.1*  CREATININE 0.78 0.89    Estimated Creatinine Clearance: 33.8 mL/min (by C-G formula based on SCr of 0.89 mg/dL).   Medical History: Past Medical History:  Diagnosis Date  . Biatrial enlargement 12/01/2015  . Chronic anticoagulation    coumadin, CHADS2VASC=6  . History of mitral valve replacement   . Hypertension   . Hypothyroidism   . Osteoporosis    on Boniva  . Permanent atrial fibrillation ([place])   . Pulmonary hypertension, moderate to severe ([place]) 12/01/2015  . Stroke [facility])    on Clopidrigel  . UTI (lower urinary tract infection) 07/2015   n&v; AKI  . Vascular dementia ([place])     Medications:  See electronic med rec  Assessment: 86 y.o. F presents s/p fall.  Pt on warfarin PTA for afib and MVR. Admission INR 2.8. CBC ok on admission. Home dose: Warfarin 4mg  M/W/F and 3mg  all other days INR 3.1, therapeutic  Goal of Therapy:   INR 2.5-3.5 Monitor platelets by anticoagulation protocol: Yes   Plan:  Daily INR warfarin 4mg   M/W/F and 3mg  all other days  Isac Sarna, BS Vena [place], BCPS Clinical Pharmacist Pager [phone] 06/17/2020,8:29 AM

## 2020-06-17 NOTE — ED Notes (Signed)
Pts Son [name] updated on Pts status and Pts new room assignment to [id number].

## 2020-06-17 NOTE — ED Notes (Signed)
In to give pt her Coumadin. Son requesting that I check with MD first because he "thought MD stated he was going to hold tonight's Coumadin dose". Text chat sent to Dr Waldron [facility] and he does want tonight's dose of Coumadin held.

## 2020-06-17 NOTE — TOC Initial Note (Signed)
Transition of Care [facility]) - Initial/Assessment Note    Patient Details  Name: Courtney [name] MRN: [id number] Date of Birth: Jul 30, 1929  Transition of Care [facility]) CM/SW Contact:    Natasha Bence, LCSW Phone Number: 06/17/2020, 2:25 PM  Clinical Narrative:                 Patient is a 86 year old female admitted for Closed fracture of multiple pubic rami, left, initial encounter. CSW contacted patient for SNF referral. ED nurse attempted to have patient discuss SNF referral. Patient was not able to discuss SNF placement. CSW inquired about patient's orientation. Nurse reported that patient is in and out of being oriented. CSW contacted patient's family for SNF consult. There was not a voicemail set up for the son's contact number and CSW did not receive answer when call placed. CSW LVM with [facility] on contact list. TOC to follow.    Barriers to Discharge: Continued Medical Work up   Patient Goals and CMS Choice        Expected Discharge Plan and Services                                                Prior Living Arrangements/Services                       Activities of Daily Living Home Assistive Devices/Equipment: None ADL Screening (condition at time of admission) Patient's cognitive ability adequate to safely complete daily activities?: Yes Is the patient deaf or have difficulty hearing?: Yes Does the patient have difficulty seeing, even when wearing glasses/contacts?: No Does the patient have difficulty concentrating, remembering, or making decisions?: Yes Patient able to express need for assistance with ADLs?: Yes Does the patient have difficulty dressing or bathing?: No Independently performs ADLs?: Yes (appropriate for developmental age) Does the patient have difficulty walking or climbing stairs?: Yes Weakness of Legs: Both Weakness of Arms/Hands: Both  Permission Sought/Granted                  Emotional Assessment               Admission diagnosis:  Closed fracture of multiple pubic rami, left, initial encounter [facility]) [S32.592A] Patient Active Problem List   Diagnosis Date Noted  . Closed fracture of multiple pubic rami, left, initial encounter ([place]) 06/16/2020  . Frequent falls 05/14/2018  . Hypercalcemia 05/14/2018  . Closed fracture of right proximal humerus 05/14/2018  . Periorbital hematoma of right eye 05/14/2018  . Encounter for therapeutic drug monitoring 03/11/2018  . T12 compression fracture ([place]) 04/22/2017  . Dementia, multi-infarct ([place]) 04/22/2017  . Cardioembolic stroke ([place]) 00/17/4944  . B12 deficiency 04/22/2017  . Cough 12/16/2015  . Osteoporosis 12/05/2015  . Biatrial enlargement 12/01/2015  . Pulmonary hypertension, moderate to severe ([place]) 12/01/2015  . Closed fracture of intertrochanteric section of femur ([place])   . Chronic atrial fibrillation ([place]) 11/30/2015  . Hypothyroidism 11/29/2015  . Thrombocytopenia ([place]) 07/13/2015  . Chronic anticoagulation 07/13/2015  . S/P mitral valve replacement 07/13/2015  . Vascular dementia, due to brain bleed from elevated INR, not due to ischemic strokes.  07/12/2015  . Essential hypertension 07/12/2015   PCP:  Doree Albee, MD Pharmacy:   [id number] - [place], [place]  [place] [address] Phone: [phone] Fax: [phone]     Social Determinants of Health (SDOH) Interventions    Readmission Risk Interventions No flowsheet data found.

## 2020-06-17 NOTE — Plan of Care (Signed)
  Problem: Acute Rehab PT Goals(only PT should resolve) Goal: Pt Will Go Supine/Side To Sit Outcome: Progressing [place]she[place] (Taken 06/17/2020 1122) Pt will go Supine/Side to Sit: with minimal assist Goal: Patient Will Perform Sitting Balance Outcome: Progressing [place]sheets (Taken 06/17/2020 1122) Patient will perform sitting balance: with supervision Goal: Patient Will Transfer Sit To/From Stand Outcome: Progressing [place] (Taken 06/17/2020 1122) Patient will transfer sit to/from stand:  with min guard assist  with minimal assist Goal: Pt Will Transfer Bed To Chair/Chair To Bed Outcome: Progressing Flowsheets (Taken 06/17/2020 1122) Pt will Transfer Bed to Chair/Chair to Bed: with min assist Goal: Pt Will Ambulate Outcome: Progressing [place]sheets (Taken 06/17/2020 1122) Pt will Ambulate:  15 feet  with moderate assist  with minimal assist  with least restrictive assistive device  11:22 AM, 06/17/20 Mearl Latin PT, DPT Physical Therapist at [facility]

## 2020-06-17 NOTE — Progress Notes (Addendum)
PROGRESS NOTE   Courtney [name]  CZY:[id number] DOB: 1929-03-27 DOA: 06/16/2020 PCP: Doree Albee, MD   Chief Complaint  Patient presents with  . Fall   Brief Admission History:  86 y.o. female, with past medical history significant for permanent A. fib, mitral valve replacement, chronic anticoagulation on Coumadin, pulmonary hypertension, CVA, vascular dementia, patient presents to ED secondary to fall, at baseline patient with poor balance, ambulates mainly with walker, she is with history of multiple falls in the past, she is on warfarin for mechanical heart valve, patient had mechanical fall this morning, no dizziness, no lightheadedness, no head trauma, she lost her balance while on the walker, son tried to catch her, he was unable to, patient landed on her bottom, and then fell backward, patient told son she was in pain, but was unable to locate where is the pain, so he brought her to ED for further evaluation.  In ED patient INR was therapeutic at 2.8, her CT pelvis was significant for left sacral/rami fracture, Per  ED discussion with Dr. Doreatha Martin, this is nonoperative management, recommendation for weightbearing as tolerated, so [facility] hospitalist consulted to admit.  Assessment & Plan:   Active Problems:   Vascular dementia, due to brain bleed from elevated INR, not due to ischemic strokes.    Essential hypertension   Chronic anticoagulation   S/P mitral valve replacement   Hypothyroidism   Chronic atrial fibrillation (HCC)   Dementia, multi-infarct (HCC)   Cardioembolic stroke (HCC)   U93 deficiency   Frequent falls   Closed fracture of multiple pubic rami, left, initial encounter [facility])   Fall with nondisplaced left sacral and pubic rami fracture -Patient with history of multiple falls in the past and fractures on multiple admissions, giving her age, frailty, history of CVA and severe deconditioning I worry about a severe bleeding complication from another fall.  I have asked for  a palliative care consultation.   -Ortho input appreciated, Dr. Doreatha Martin recommending nonoperative treatment, patient may be weightbearing as tolerated on bilateral lower extremities  PT recommending SNF.  -Continue to monitor CBC closely given evidence of surrounding small hematoma, and this patient was on warfarin and Plavix. -continue to work on pain management  Vascular Dementia  -Pt remains more confused than her reported baseline but no behavioral disturbance -High risk for acute delirium.   -Delirium precautions ordered.   Acute blood loss anemia  - Following CBC closely as she is high risk for significant bleed  Thrombocytopenia  - this is worrisome given her anticoagulation, will follow closely with daily CBC testing  Chronic anticoagulation secondary to A. fib and mechanical mitral valve replacement - continue with warfarin, pharmacy to dose.  UTI/hematuria with increasing confusion from baseline -hopefully confusion will improve as UTI is controlled -continue ceftriaxone, follow urine culture  Chronic Atrial fibrillation -Continue with atenolol for heart rate control -warfarin management per pharmacist.  Mechanical valve replacement -Continue with warfarin with target INR 2.5-3.5  Hypothyroidism -Continue with Synthroid  Hypertension -Continue with lisinopril and atenolol  DVT prophylaxis:  warfarin Code Status: DNR Family Communication: son  Disposition: SNF   Status is: Observation - changing to inpatient   The patient will require care spanning > 2 midnights and should be moved to inpatient because: Unsafe d/c plan and Inpatient level of care appropriate due to severity of illness  Dispo: The patient is from: Home              Anticipated d/c is to: SNF  Anticipated d/c date is: 2 days              Patient currently is not medically stable to d/c.  Consultants:   orthopedics  Procedures:     Antimicrobials:  Ceftriaxone IV  11/12>>   Subjective: Pt remains very confused but pleasant, son reports more confused than baseline, pt verbalizes no specific complaints  Objective: Vitals:   06/17/20 1115 06/17/20 1130 06/17/20 1200 06/17/20 1300  BP:  (!) 159/80 (!) 167/101 (!) 142/96  Pulse: 73 66 (!) 108 92  Resp: (!) 22 16 (!) 25 19  Temp:      TempSrc:      SpO2: 100% 93% (!) 80% 93%  Weight:      Height:        Intake/Output Summary (Last 24 hours) at 06/17/2020 1403 Last data filed at 06/16/2020 2317 Gross per 24 hour  Intake 100 ml  Output --  Net 100 ml   Filed Weights   06/16/20 1547  Weight: 49.9 kg    Examination:  General exam: elderly fraile female, Appears calm and comfortable  Respiratory system: Clear to auscultation. Respiratory effort normal. Cardiovascular system: normal S1 & S2 heard. No JVD, murmurs, rubs, gallops or clicks. No pedal edema. Gastrointestinal system: Abdomen is nondistended, soft and nontender. No organomegaly or masses felt. Normal bowel sounds heard. Central nervous system: Alert and confused. No focal neurological deficits. Extremities: Symmetric 5 x 5 power. Skin: No rashes, lesions or ulcers Psychiatry:  Mood & affect appropriate.   Data Reviewed: I have personally reviewed following labs and imaging studies  CBC: Recent Labs  Lab 06/16/20 1650 06/17/20 0351  WBC 8.5 6.1  NEUTROABS 7.1  --   HGB 11.9* 10.9*  HCT 38.5 35.1*  MCV 92.5 92.1  PLT 162 120*    Basic Metabolic Panel: Recent Labs  Lab 06/16/20 1650 06/17/20 0351  NA 141 138  K 4.1 4.2  CL 106 104  CO2 29 26  GLUCOSE 103* 113*  BUN 22 22  CREATININE 0.78 0.89  CALCIUM 9.6 9.2    GFR: Estimated Creatinine Clearance: 33.8 mL/min (by C-G formula based on SCr of 0.89 mg/dL).  Liver Function Tests: Recent Labs  Lab 06/16/20 1650  AST 28  ALT 26  ALKPHOS 67  BILITOT 0.8  PROT 6.6  ALBUMIN 4.0    CBG: No results for input(s): GLUCAP in the last 168 hours.  Recent  Results (from the past 240 hour(s))  Respiratory Panel by RT PCR (Flu A&B, Covid) - Nasopharyngeal Swab     Status: None   Collection Time: 06/16/20 10:02 PM   Specimen: Nasopharyngeal Swab  Result Value Ref Range Status   SARS Coronavirus 2 by RT PCR NEGATIVE NEGATIVE Final    Comment: (NOTE) SARS-CoV-2 target nucleic acids are NOT DETECTED.  The SARS-CoV-2 RNA is generally detectable in upper respiratoy specimens during the acute phase of infection. The lowest concentration of SARS-CoV-2 viral copies this assay can detect is 131 copies/mL. A negative result does not preclude SARS-Cov-2 infection and should not be used as the sole basis for treatment or other patient management decisions. A negative result may occur with  improper specimen collection/handling, submission of specimen other than nasopharyngeal swab, presence of viral mutation(s) within the areas targeted by this assay, and inadequate number of viral copies (<131 copies/mL). A negative result must be combined with clinical observations, patient history, and epidemiological information. The expected result is Negative.  Fact Sheet for Patients:  PinkCheek.be  Fact Sheet for Healthcare Providers:  GravelBags.it  This test is no t yet approved or cleared by the [place] FDA and  has been authorized for detection and/or diagnosis of SARS-CoV-2 by FDA under an Emergency Use Authorization (EUA). This EUA will remain  in effect (meaning this test can be used) for the duration of the COVID-19 declaration under Section 564(b)(1) of the Act, 21 U.S.C. section 360bbb-3(b)(1), unless the authorization is terminated or revoked sooner.     Influenza A by PCR NEGATIVE NEGATIVE Final   Influenza B by PCR NEGATIVE NEGATIVE Final    Comment: (NOTE) The Xpert Xpress SARS-CoV-2/FLU/RSV assay is intended as an aid in  the diagnosis of influenza from Nasopharyngeal swab  specimens and  should not be used as a sole basis for treatment. Nasal washings and  aspirates are unacceptable for Xpert Xpress SARS-CoV-2/FLU/RSV  testing.  Fact Sheet for Patients: PinkCheek.be  Fact Sheet for Healthcare Providers: GravelBags.it  This test is not yet approved or cleared by the [place] FDA and  has been authorized for detection and/or diagnosis of SARS-CoV-2 by  FDA under an Emergency Use Authorization (EUA). This EUA will remain  in effect (meaning this test can be used) for the duration of the  Covid-19 declaration under Section 564(b)(1) of the Act, 21  U.S.C. section 360bbb-3(b)(1), unless the authorization is  terminated or revoked. Performed at [facility], [address], [place], [place] [address]      Radiology Studies: DG Chest 2 View  Result Date: 06/16/2020 CLINICAL DATA:  [place], bilateral hip pain EXAM: CHEST - 2 VIEW COMPARISON:  05/29/2017 FINDINGS: Frontal and lateral views of the chest demonstrate an enlarged cardiac silhouette. Postsurgical changes from median sternotomy and mitral valve replacement. Background scarring again noted without focal consolidation, effusion, or pneumothorax. No acute bony abnormalities. IMPRESSION: 1. Stable exam, no acute process. Electronically Signed   By: Randa Ngo M.D.   On: 06/16/2020 16:55   CT Head Wo Contrast  Result Date: 06/16/2020 CLINICAL DATA:  [place], hit head, hypertension EXAM: CT HEAD WITHOUT CONTRAST TECHNIQUE: Contiguous axial images were obtained from the base of the skull through the vertex without intravenous contrast. COMPARISON:  05/13/2018 FINDINGS: Brain: Scattered areas of chronic small vessel ischemic changes are seen throughout the periventricular white matter, stable. Chronic encephalomalacia right frontal lobe from prior cortical infarct. No acute infarct or hemorrhage. Lateral ventricles and midline structures are  unremarkable. No acute extra-axial fluid collections. No mass effect. Vascular: No hyperdense vessel or unexpected calcification. Skull: Normal. Negative for fracture or focal lesion. Sinuses/Orbits: No acute finding. Other: None. IMPRESSION: 1. Chronic ischemic changes as above, no acute intracranial process. Electronically Signed   By: Randa Ngo M.D.   On: 06/16/2020 18:54   CT Cervical Spine Wo Contrast  Result Date: 06/16/2020 CLINICAL DATA:  [place], hypertension EXAM: CT CERVICAL SPINE WITHOUT CONTRAST TECHNIQUE: Multidetector CT imaging of the cervical spine was performed without intravenous contrast. Multiplanar CT image reconstructions were also generated. COMPARISON:  05/13/2018 FINDINGS: Alignment: Alignment is anatomic. Skull base and vertebrae: No acute fracture. No primary bone lesion or focal pathologic process. Soft tissues and spinal canal: No prevertebral fluid or swelling. No visible canal hematoma. Disc levels: Extensive multilevel spondylosis and facet hypertrophy is unchanged. Bony fusion across the C2/C3 facet joints. Upper chest: Airway is patent.  Lung apices are clear. Other: Reconstructed images demonstrate no additional findings. IMPRESSION: 1. No acute cervical spine fracture. Stable multilevel degenerative changes. Electronically Signed  By: Randa Ngo M.D.   On: 06/16/2020 18:57   CT PELVIS WO CONTRAST  Result Date: 06/16/2020 CLINICAL DATA:  Fall, bilateral hip pain, abnormal x-ray EXAM: CT PELVIS WITHOUT CONTRAST TECHNIQUE: Multidetector CT imaging of the pelvis was performed following the standard protocol without intravenous contrast. COMPARISON:  None. FINDINGS: Urinary Tract: 3 mm nonobstructing calculus noted within the visualized lower pole of the right kidney. Vascular calcifications are noted within the visualized renal hila. Preserved renal cortical thickness. No hydronephrosis. The bladder is decompressed, but appears relatively thick walled and  demonstrates mild perivesicular inflammatory stranding suggesting a superimposed infectious or inflammatory cystitis. Bowel:  Unremarkable.  No free intraperitoneal fluid Vascular/Lymphatic: Extensive aortoiliac atherosclerotic calcification. No aortic aneurysm. No pathologic adenopathy within the abdomen and pelvis. Reproductive: 2.7 cm incompletely evaluated but simple appearing cyst noted within the right ovary. Uterus absent., Other: Infiltrative soft tissue within the left pelvic sidewall and left operator internus musculature is in keeping with intramuscular and interstitial hemorrhage related to multiple pelvic fractures described below. No abdominal wall hernia identified. Musculoskeletal: Left hip ORIF utilizing a gamma nail has been performed, partially visualized. The osseous structures are diffusely osteopenic. There are acute fractures of the left ischium with minimal displacement of the inferior ischial fracture fragment comprising the origin of the hamstring musculature. Additionally, there is a a minimally displaced fracture of the left sacral ala extending to the caudal sacroiliac joint with minimal displacement of the fracture fragment. Advanced degenerative changes are seen within the lumbar spine. Advanced degenerative changes are seen within the hips bilaterally. No dislocation. IMPRESSION: Acute, minimally displaced fractures of the left ischium and left sacral ala. Femoral heads are still seated within the acetabula bilaterally. Superimposed severe degenerative change within the lumbar spine and hips bilaterally. Interstitial and intramuscular hemorrhage within the left pelvic sidewall and left operator internus. 2.7 cm incompletely evaluated simple appearing cyst within the right ovary. Further follow-up is not required given its size. Bladder wall thickening and perivesicular inflammatory stranding suggesting a diffuse infectious or inflammatory cystitis. Correlation with urinalysis and  urine culture may be helpful. Minimal nonobstructing right nephrolithiasis. Aortic Atherosclerosis (ICD10-I70.0). Electronically Signed   By: Fidela Salisbury MD   On: 06/16/2020 20:29   DG Hips Bilat W or Wo Pelvis 3-4 Views  Result Date: 06/16/2020 CLINICAL DATA:  86 year old female with fall and bilateral hip pain. EXAM: DG HIP (WITH OR WITHOUT PELVIS) 3-4V BILAT COMPARISON:  Left hip radiograph dated 06/09/2017. FINDINGS: Evaluation for fracture is very limited due to advanced osteopenia. There is faint linear lucency through the left ischial tuberosity and left inferior pubic ramus which may be artifactual. Nondisplaced fracture is not excluded. Correlation with point tenderness recommended. No other acute fracture identified. There is no dislocation. Prior ORIF of the left femoral fracture. The hardware is intact. The soft tissues are unremarkable. Vascular calcifications noted. IMPRESSION: 1. Artifact versus less likely nondisplaced fracture of the left ischial tuberosity and left inferior pubic ramus. Correlation with point tenderness recommended. 2. No other acute fracture identified. No dislocation. 3. Advanced osteopenia. Electronically Signed   By: Anner Crete M.D.   On: 06/16/2020 16:59   Scheduled Meds: . vitamin C  250 mg Oral Daily  . atenolol  12.5 mg Oral Daily  . B-complex with vitamin C  1 tablet Oral Daily  . clopidogrel  75 mg Oral Daily  . docusate sodium  100 mg Oral BID  . levothyroxine  100 mcg Oral QAC breakfast  . lisinopril  2.5 mg Oral Daily  . warfarin  3 mg Oral Once per day on Sun Tue Thu Sat  . warfarin  4 mg Oral Once per day on Mon Wed Fri  . Warfarin - Pharmacist Dosing Inpatient   Does not apply q1600   Continuous Infusions: . cefTRIAXone (ROCEPHIN)  IV       LOS: 0 days   Time spent: 38 mins   Breann Losano Wynetta Emery, MD How to contact the [facility] Attending or Consulting provider [place] or covering provider during after hours [place], for this patient?   1. Check the care team in [facility] and look for a) attending/consulting [facility] provider listed and b) the [facility] team listed 2. Log into [email or web address] and use [place]'s universal password to access. If you do not have the password, please contact the hospital operator. 3. Locate the [facility] provider you are looking for under [facility] and page to a number that you can be directly reached. 4. If you still have difficulty reaching the provider, please page the [facility] (Director on Call) for the Hospitalists listed on amion for assistance.  06/17/2020, 2:03 PM

## 2020-06-17 NOTE — ED Notes (Signed)
Attending in room

## 2020-06-18 LAB — CBC
HCT: 32.6 % — ABNORMAL LOW (ref 36.0–46.0)
Hemoglobin: 10 g/dL — ABNORMAL LOW (ref 12.0–15.0)
MCH: 27.8 pg (ref 26.0–34.0)
MCHC: 30.7 g/dL (ref 30.0–36.0)
MCV: 90.6 fL (ref 80.0–100.0)
Platelets: 107 10*3/uL — ABNORMAL LOW (ref 150–400)
RBC: 3.6 MIL/uL — ABNORMAL LOW (ref 3.87–5.11)
RDW: 15 % (ref 11.5–15.5)
WBC: 6.5 10*3/uL (ref 4.0–10.5)
nRBC: 0 % (ref 0.0–0.2)

## 2020-06-18 LAB — PROTIME-INR
INR: 2.7 — ABNORMAL HIGH (ref 0.8–1.2)
Prothrombin Time: 27.6 seconds — ABNORMAL HIGH (ref 11.4–15.2)

## 2020-06-18 LAB — OCCULT BLOOD X 1 CARD TO LAB, STOOL: Fecal Occult Bld: NEGATIVE

## 2020-06-18 NOTE — Plan of Care (Signed)

## 2020-06-18 NOTE — Progress Notes (Signed)
PROGRESS NOTE   Courtney [name]  JOI:[id number] DOB: 1929/05/19 DOA: 06/16/2020 PCP: Doree Albee, MD   Chief Complaint  Patient presents with  . Fall   Brief Admission History:  86 y.o. female, with past medical history significant for permanent A. fib, mitral valve replacement, chronic anticoagulation on Coumadin, pulmonary hypertension, CVA, vascular dementia, patient presents to ED secondary to fall, at baseline patient with poor balance, ambulates mainly with walker, she is with history of multiple falls in the past, she is on warfarin for mechanical heart valve, patient had mechanical fall this morning, no dizziness, no lightheadedness, no head trauma, she lost her balance while on the walker, son tried to catch her, he was unable to, patient landed on her bottom, and then fell backward, patient told son she was in pain, but was unable to locate where is the pain, so he brought her to ED for further evaluation.  In ED patient INR was therapeutic at 2.8, her CT pelvis was significant for left sacral/rami fracture, Per  ED discussion with Dr. Doreatha Martin, this is nonoperative management, recommendation for weightbearing as tolerated, so [facility] hospitalist consulted to admit.  Assessment & Plan:   Active Problems:   Vascular dementia, due to brain bleed from elevated INR, not due to ischemic strokes.    Essential hypertension   Chronic anticoagulation   S/P mitral valve replacement   Hypothyroidism   Chronic atrial fibrillation (HCC)   Dementia, multi-infarct (HCC)   Cardioembolic stroke (HCC)   B58 deficiency   Frequent falls   Closed fracture of multiple pubic rami, left, initial encounter ([place])   Metabolic encephalopathy   Fall with nondisplaced left sacral and pubic rami fracture -Patient with history of multiple falls in the past and fractures on multiple admissions, giving her age, frailty, history of CVA and severe deconditioning I worry about a severe bleeding complication from  another fall.  I have asked for a palliative care consultation.   -Ortho input appreciated, Dr. Doreatha Martin recommending nonoperative treatment, patient may be weightbearing as tolerated on bilateral lower extremities  PT recommending SNF.  -Continue to monitor CBC closely given evidence of surrounding small hematoma, and this patient was on warfarin and Plavix. -continue to work on pain management  Vascular Dementia  -Pt remains more confused than her reported baseline but no behavioral disturbance -High risk for acute delirium.   -Delirium precautions ordered.   Acute blood loss anemia  - Following CBC closely as she is high risk for significant bleed  Thrombocytopenia  - this is worrisome given her anticoagulation, will follow closely with daily CBC testing  Chronic anticoagulation secondary to A. fib and mechanical mitral valve replacement - continue with warfarin, pharmacy to dose.  UTI/hematuria with increasing confusion from baseline -hopefully confusion will improve as UTI is controlled -continue ceftriaxone, follow urine culture  Chronic Atrial fibrillation -Continue with atenolol for heart rate control -warfarin management per pharmacist.  Mechanical valve replacement -Continue with warfarin with target INR 2.5-3.5  Hypothyroidism -Continue with Synthroid  Hypertension -Continue with lisinopril and atenolol  DVT prophylaxis:  warfarin Code Status: DNR Family Communication: t/c to son david and cindy no answer, unable to leave msg  Disposition: SNF   Status is: inpatient   The patient will require care spanning > 2 midnights and should be moved to inpatient because: Unsafe d/c plan and Inpatient level of care appropriate due to severity of illness  Dispo: The patient is from: Home  Anticipated d/c is to: SNF              Anticipated d/c date is: 2 days              Patient currently is not medically stable to d/c.  Consultants:    orthopedics  Procedures:     Antimicrobials:  Ceftriaxone IV 11/12>>   Subjective: Pt remains confused but pleasant, she is more confused than baseline per family.   Objective: Vitals:   06/17/20 2300 06/17/20 2336 06/17/20 2337 06/18/20 0638  BP: (!) 159/82 (!) 149/77  139/75  Pulse: 62 66  69  Resp: 20 16  16   Temp:  99.2 F (37.3 C)  99.6 F (37.6 C)  TempSrc:  Axillary  Axillary  SpO2: 98% 93%  97%  Weight:   52.2 kg   Height:   5\' 7"  (1.702 m)     Intake/Output Summary (Last 24 hours) at 06/18/2020 1615 Last data filed at 06/18/2020 1536 Gross per 24 hour  Intake 590 ml  Output 150 ml  Net 440 ml   Filed Weights   06/16/20 1547 06/17/20 2337  Weight: 49.9 kg 52.2 kg    Examination:  General exam: elderly fraile female, Appears calm and comfortable  Respiratory system: Clear to auscultation. Respiratory effort normal. Cardiovascular system: normal S1 & S2 heard. No JVD, murmurs, rubs, gallops or clicks. No pedal edema. Gastrointestinal system: Abdomen is nondistended, soft and nontender. No organomegaly or masses felt. Normal bowel sounds heard. Central nervous system: Alert and confused. No focal neurological deficits. Extremities: Symmetric 5 x 5 power. Skin: No rashes, lesions or ulcers Psychiatry:  Mood & affect appropriate.   Data Reviewed: I have personally reviewed following labs and imaging studies  CBC: Recent Labs  Lab 06/16/20 1650 06/17/20 0351 06/18/20 0745  WBC 8.5 6.1 6.5  NEUTROABS 7.1  --   --   HGB 11.9* 10.9* 10.0*  HCT 38.5 35.1* 32.6*  MCV 92.5 92.1 90.6  PLT 162 120* 107*    Basic Metabolic Panel: Recent Labs  Lab 06/16/20 1650 06/17/20 0351  NA 141 138  K 4.1 4.2  CL 106 104  CO2 29 26  GLUCOSE 103* 113*  BUN 22 22  CREATININE 0.78 0.89  CALCIUM 9.6 9.2    GFR: Estimated Creatinine Clearance: 35.3 mL/min (by C-G formula based on SCr of 0.89 mg/dL).  Liver Function Tests: Recent Labs  Lab  06/16/20 1650  AST 28  ALT 26  ALKPHOS 67  BILITOT 0.8  PROT 6.6  ALBUMIN 4.0    CBG: No results for input(s): GLUCAP in the last 168 hours.  Recent Results (from the past 240 hour(s))  Respiratory Panel by RT PCR (Flu A&B, Covid) - Nasopharyngeal Swab     Status: None   Collection Time: 06/16/20 10:02 PM   Specimen: Nasopharyngeal Swab  Result Value Ref Range Status   SARS Coronavirus 2 by RT PCR NEGATIVE NEGATIVE Final    Comment: (NOTE) SARS-CoV-2 target nucleic acids are NOT DETECTED.  The SARS-CoV-2 RNA is generally detectable in upper respiratoy specimens during the acute phase of infection. The lowest concentration of SARS-CoV-2 viral copies this assay can detect is 131 copies/mL. A negative result does not preclude SARS-Cov-2 infection and should not be used as the sole basis for treatment or other patient management decisions. A negative result may occur with  improper specimen collection/handling, submission of specimen other than nasopharyngeal swab, presence of viral mutation(s) within the areas targeted by  this assay, and inadequate number of viral copies (<131 copies/mL). A negative result must be combined with clinical observations, patient history, and epidemiological information. The expected result is Negative.  Fact Sheet for Patients:  PinkCheek.be  Fact Sheet for Healthcare Providers:  GravelBags.it  This test is no t yet approved or cleared by the [place] FDA and  has been authorized for detection and/or diagnosis of SARS-CoV-2 by FDA under an Emergency Use Authorization (EUA). This EUA will remain  in effect (meaning this test can be used) for the duration of the COVID-19 declaration under Section 564(b)(1) of the Act, 21 U.S.C. section 360bbb-3(b)(1), unless the authorization is terminated or revoked sooner.     Influenza A by PCR NEGATIVE NEGATIVE Final   Influenza B by PCR  NEGATIVE NEGATIVE Final    Comment: (NOTE) The Xpert Xpress SARS-CoV-2/FLU/RSV assay is intended as an aid in  the diagnosis of influenza from Nasopharyngeal swab specimens and  should not be used as a sole basis for treatment. Nasal washings and  aspirates are unacceptable for Xpert Xpress SARS-CoV-2/FLU/RSV  testing.  Fact Sheet for Patients: PinkCheek.be  Fact Sheet for Healthcare Providers: GravelBags.it  This test is not yet approved or cleared by the [place] FDA and  has been authorized for detection and/or diagnosis of SARS-CoV-2 by  FDA under an Emergency Use Authorization (EUA). This EUA will remain  in effect (meaning this test can be used) for the duration of the  Covid-19 declaration under Section 564(b)(1) of the Act, 21  U.S.C. section 360bbb-3(b)(1), unless the authorization is  terminated or revoked. Performed at [facility], [address] [place], [place] [address]      Radiology Studies: DG Chest 2 View  Result Date: 06/16/2020 CLINICAL DATA:  [place], bilateral hip pain EXAM: CHEST - 2 VIEW COMPARISON:  05/29/2017 FINDINGS: Frontal and lateral views of the chest demonstrate an enlarged cardiac silhouette. Postsurgical changes from median sternotomy and mitral valve replacement. Background scarring again noted without focal consolidation, effusion, or pneumothorax. No acute bony abnormalities. IMPRESSION: 1. Stable exam, no acute process. Electronically Signed   By: Randa Ngo M.D.   On: 06/16/2020 16:55   CT Head Wo Contrast  Result Date: 06/16/2020 CLINICAL DATA:  [place], hit head, hypertension EXAM: CT HEAD WITHOUT CONTRAST TECHNIQUE: Contiguous axial images were obtained from the base of the skull through the vertex without intravenous contrast. COMPARISON:  05/13/2018 FINDINGS: Brain: Scattered areas of chronic small vessel ischemic changes are seen throughout the periventricular white matter,  stable. Chronic encephalomalacia right frontal lobe from prior cortical infarct. No acute infarct or hemorrhage. Lateral ventricles and midline structures are unremarkable. No acute extra-axial fluid collections. No mass effect. Vascular: No hyperdense vessel or unexpected calcification. Skull: Normal. Negative for fracture or focal lesion. Sinuses/Orbits: No acute finding. Other: None. IMPRESSION: 1. Chronic ischemic changes as above, no acute intracranial process. Electronically Signed   By: Randa Ngo M.D.   On: 06/16/2020 18:54   CT Cervical Spine Wo Contrast  Result Date: 06/16/2020 CLINICAL DATA:  [place], hypertension EXAM: CT CERVICAL SPINE WITHOUT CONTRAST TECHNIQUE: Multidetector CT imaging of the cervical spine was performed without intravenous contrast. Multiplanar CT image reconstructions were also generated. COMPARISON:  05/13/2018 FINDINGS: Alignment: Alignment is anatomic. Skull base and vertebrae: No acute fracture. No primary bone lesion or focal pathologic process. Soft tissues and spinal canal: No prevertebral fluid or swelling. No visible canal hematoma. Disc levels: Extensive multilevel spondylosis and facet hypertrophy is unchanged. Bony fusion  across the C2/C3 facet joints. Upper chest: Airway is patent.  Lung apices are clear. Other: Reconstructed images demonstrate no additional findings. IMPRESSION: 1. No acute cervical spine fracture. Stable multilevel degenerative changes. Electronically Signed   By: Randa Ngo M.D.   On: 06/16/2020 18:57   CT PELVIS WO CONTRAST  Result Date: 06/16/2020 CLINICAL DATA:  Fall, bilateral hip pain, abnormal x-ray EXAM: CT PELVIS WITHOUT CONTRAST TECHNIQUE: Multidetector CT imaging of the pelvis was performed following the standard protocol without intravenous contrast. COMPARISON:  None. FINDINGS: Urinary Tract: 3 mm nonobstructing calculus noted within the visualized lower pole of the right kidney. Vascular calcifications are noted within the  visualized renal hila. Preserved renal cortical thickness. No hydronephrosis. The bladder is decompressed, but appears relatively thick walled and demonstrates mild perivesicular inflammatory stranding suggesting a superimposed infectious or inflammatory cystitis. Bowel:  Unremarkable.  No free intraperitoneal fluid Vascular/Lymphatic: Extensive aortoiliac atherosclerotic calcification. No aortic aneurysm. No pathologic adenopathy within the abdomen and pelvis. Reproductive: 2.7 cm incompletely evaluated but simple appearing cyst noted within the right ovary. Uterus absent., Other: Infiltrative soft tissue within the left pelvic sidewall and left operator internus musculature is in keeping with intramuscular and interstitial hemorrhage related to multiple pelvic fractures described below. No abdominal wall hernia identified. Musculoskeletal: Left hip ORIF utilizing a gamma nail has been performed, partially visualized. The osseous structures are diffusely osteopenic. There are acute fractures of the left ischium with minimal displacement of the inferior ischial fracture fragment comprising the origin of the hamstring musculature. Additionally, there is a a minimally displaced fracture of the left sacral ala extending to the caudal sacroiliac joint with minimal displacement of the fracture fragment. Advanced degenerative changes are seen within the lumbar spine. Advanced degenerative changes are seen within the hips bilaterally. No dislocation. IMPRESSION: Acute, minimally displaced fractures of the left ischium and left sacral ala. Femoral heads are still seated within the acetabula bilaterally. Superimposed severe degenerative change within the lumbar spine and hips bilaterally. Interstitial and intramuscular hemorrhage within the left pelvic sidewall and left operator internus. 2.7 cm incompletely evaluated simple appearing cyst within the right ovary. Further follow-up is not required given its size. Bladder wall  thickening and perivesicular inflammatory stranding suggesting a diffuse infectious or inflammatory cystitis. Correlation with urinalysis and urine culture may be helpful. Minimal nonobstructing right nephrolithiasis. Aortic Atherosclerosis (ICD10-I70.0). Electronically Signed   By: Fidela Salisbury MD   On: 06/16/2020 20:29   DG Hips Bilat W or Wo Pelvis 3-4 Views  Result Date: 06/16/2020 CLINICAL DATA:  86 year old female with fall and bilateral hip pain. EXAM: DG HIP (WITH OR WITHOUT PELVIS) 3-4V BILAT COMPARISON:  Left hip radiograph dated 06/09/2017. FINDINGS: Evaluation for fracture is very limited due to advanced osteopenia. There is faint linear lucency through the left ischial tuberosity and left inferior pubic ramus which may be artifactual. Nondisplaced fracture is not excluded. Correlation with point tenderness recommended. No other acute fracture identified. There is no dislocation. Prior ORIF of the left femoral fracture. The hardware is intact. The soft tissues are unremarkable. Vascular calcifications noted. IMPRESSION: 1. Artifact versus less likely nondisplaced fracture of the left ischial tuberosity and left inferior pubic ramus. Correlation with point tenderness recommended. 2. No other acute fracture identified. No dislocation. 3. Advanced osteopenia. Electronically Signed   By: Anner Crete M.D.   On: 06/16/2020 16:59   Scheduled Meds: . vitamin C  250 mg Oral Daily  . atenolol  12.5 mg Oral Daily  . B-complex with  vitamin C  1 tablet Oral Daily  . clopidogrel  75 mg Oral Daily  . docusate sodium  100 mg Oral BID  . levothyroxine  100 mcg Oral Q0600  . lisinopril  2.5 mg Oral Daily  . warfarin  3 mg Oral Once per day on Sun Tue Thu Sat  . warfarin  4 mg Oral Once per day on Mon Wed Fri  . Warfarin - Pharmacist Dosing Inpatient   Does not apply q1600   Continuous Infusions: . cefTRIAXone (ROCEPHIN)  IV Stopped (06/17/20 2028)    LOS: 1 day   Time spent: 33 mins    Kolton Kienle Wynetta Emery, MD How to contact the [facility] Attending or Consulting provider [place] or covering provider during after hours [place], for this patient?  1. Check the care team in [facility] and look for a) attending/consulting [facility] provider listed and b) the [facility] team listed 2. Log into [email or web address] and use [place]'s universal password to access. If you do not have the password, please contact the hospital operator. 3. Locate the [facility] provider you are looking for under [facility] and page to a number that you can be directly reached. 4. If you still have difficulty reaching the provider, please page the [facility] (Director on Call) for the Hospitalists listed on amion for assistance.  06/18/2020, 4:15 PM

## 2020-06-18 NOTE — Progress Notes (Signed)
ANTICOAGULATION CONSULT NOTE -   Pharmacy Consult for Warfarin Indication: PAF, MVR  Allergies  Allergen Reactions  . Penicillins Swelling    Has patient had a PCN reaction causing immediate rash, facial/tongue/throat swelling, SOB or lightheadedness with hypotension: Yes Has patient had a PCN reaction causing severe rash involving mucus membranes or skin necrosis: No Has patient had a PCN reaction that required hospitalization No Has patient had a PCN reaction occurring within the last 10 years: No If all of the above answers are "NO", then may proceed with Cephalosporin use. Whole body "swelled up" and had to go to the hospital    Patient Measurements: Height: 5\' 7"  (170.2 cm) Weight: 52.2 kg (115 lb 1.3 oz) IBW/kg (Calculated) : 61.6  Vital Signs: Temp: 99.6 F (37.6 C) (11/14 0638) Temp Source: Axillary (11/14 0638) BP: 139/75 (11/14 0638) Pulse Rate: 69 (11/14 0638)  Labs: Recent Labs    06/16/20 1650 06/16/20 1650 06/17/20 0351 06/18/20 0745  HGB 11.9*   < > 10.9* 10.0*  HCT 38.5  --  35.1* 32.6*  PLT 162  --  120* 107*  LABPROT 28.8*  --  30.6* 27.6*  INR 2.8*  --  3.1* 2.7*  CREATININE 0.78  --  0.89  --    < > = values in this interval not displayed.    Estimated Creatinine Clearance: 35.3 mL/min (by C-G formula based on SCr of 0.89 mg/dL).   Medical History: Past Medical History:  Diagnosis Date  . Biatrial enlargement 12/01/2015  . Chronic anticoagulation    coumadin, CHADS2VASC=6  . History of mitral valve replacement   . Hypertension   . Hypothyroidism   . Osteoporosis    on Boniva  . Permanent atrial fibrillation ([place])   . Pulmonary hypertension, moderate to severe ([place]) 12/01/2015  . Stroke [facility])    on Clopidrigel  . UTI (lower urinary tract infection) 07/2015   n&v; AKI  . Vascular dementia ([place])     Medications:  See electronic med rec  Assessment: 86 y.o. F presents s/p fall.  Pt on warfarin PTA for afib and MVR. Admission INR  2.8. CBC ok on admission. Home dose: Warfarin 4mg  M/W/F and 3mg  all other days INR 2.7, therapeutic  Goal of Therapy:   INR 2.5-3.5 Monitor platelets by anticoagulation protocol: Yes   Plan:  Daily INR warfarin 4mg  M/W/F and 3mg  all other days  Isac Sarna, BS Vena [place], BCPS Clinical Pharmacist Pager [phone] 06/18/2020,9:56 AM

## 2020-06-19 ENCOUNTER — Encounter (HOSPITAL_COMMUNITY): Payer: Self-pay | Admitting: Family Medicine

## 2020-06-19 DIAGNOSIS — Z7189 Other specified counseling: Secondary | ICD-10-CM

## 2020-06-19 DIAGNOSIS — Z515 Encounter for palliative care: Secondary | ICD-10-CM

## 2020-06-19 LAB — CBC
HCT: 34.3 % — ABNORMAL LOW (ref 36.0–46.0)
Hemoglobin: 10.8 g/dL — ABNORMAL LOW (ref 12.0–15.0)
MCH: 28.4 pg (ref 26.0–34.0)
MCHC: 31.5 g/dL (ref 30.0–36.0)
MCV: 90.3 fL (ref 80.0–100.0)
Platelets: 118 10*3/uL — ABNORMAL LOW (ref 150–400)
RBC: 3.8 MIL/uL — ABNORMAL LOW (ref 3.87–5.11)
RDW: 14.8 % (ref 11.5–15.5)
WBC: 7.2 10*3/uL (ref 4.0–10.5)
nRBC: 0 % (ref 0.0–0.2)

## 2020-06-19 LAB — PROTIME-INR
INR: 2.3 — ABNORMAL HIGH (ref 0.8–1.2)
Prothrombin Time: 24.3 seconds — ABNORMAL HIGH (ref 11.4–15.2)

## 2020-06-19 MED ORDER — WARFARIN SODIUM 3 MG PO TABS: ORAL_TABLET | ORAL | 2 refills | Status: AC

## 2020-06-19 MED ORDER — HYDROCODONE-ACETAMINOPHEN 5-325 MG PO TABS
1.0000 | ORAL_TABLET | Freq: Four times a day (QID) | ORAL | Status: DC | PRN
  Administered 2020-06-19 – 2020-06-20 (×2): 1 via ORAL
  Filled 2020-06-19 (×2): qty 1

## 2020-06-19 MED ORDER — WARFARIN SODIUM 2 MG PO TABS: ORAL_TABLET | ORAL | 1 refills | Status: AC

## 2020-06-19 MED ORDER — HYDROCODONE-ACETAMINOPHEN 5-325 MG PO TABS: 1.0000 | ORAL_TABLET | Freq: Four times a day (QID) | ORAL | 0 refills | Status: AC | PRN

## 2020-06-19 NOTE — Evaluation (Signed)
Occupational Therapy Evaluation Patient Details Name: Courtney [name] MRN: [id number] DOB: February 11, 1929 Today's Date: 06/19/2020    History of Present Illness  Courtney [name]  is a 86 y.o. female, with past medical history significant for permanent A. fib, mitral valve replacement, chronic anticoagulation on Coumadin, pulmonary hypertension, CVA, vascular dementia, patient presents to ED secondary to fall, at baseline patient with poor balance, ambulates mainly with walker, she is with history of multiple falls in the past, she is on warfarin for mechanical heart valve, patient had mechanical fall this morning, no dizziness, no lightheadedness, no head trauma, she lost her balance while on the walker, son tried to catch her, he was unable to, patient landed on her bottom, and then fell backward, patient told son she was in pain, but was unable to locate where is the pain, so he brought her to ED for further evaluation.   Clinical Impression   Pt admitted with recent fall at home sustaining a left sacral/rami fracture. Pt currently with functional limitations due to the deficits listed below (see OT Problem List). Upon entering room, patient was laying on her right side in bed with noticeable skin tear on right knee and blood on bedding and hospital gown. Nursing was notified of need for wound care. Therapist changed hospital gown and fitted sheet as well. Blood was cleaned from knee and all surfaces. At end of evaluation, PT arrived to complete transfer from bed to chair. At end of evaluation, nursing had not returned to complete wound care. Pt was left with PT in chair.  Pt will benefit from skilled OT to increase their safety and independence with ADL and functional mobility for ADL to facilitate discharge to venue listed below.       Follow Up Recommendations  SNF;Supervision/Assistance - 24 hour    Equipment Recommendations  None recommended by OT       Precautions / Restrictions  Precautions Precautions: Fall Restrictions Weight Bearing Restrictions: No RLE Weight Bearing: Weight bearing as tolerated LLE Weight Bearing: Weight bearing as tolerated      Mobility Bed Mobility Overal bed mobility: Needs Assistance Bed Mobility: Sidelying to Sit   Sidelying to sit: Total assist    General bed mobility comments: slow labored movement with c/o severe pain left hip/sacral area        Balance Overall balance assessment: Needs assistance;History of Falls Sitting-balance support: Feet supported;Single extremity supported Sitting balance-Leahy Scale: Fair Sitting balance - Comments: seated EOB Postural control: Right lateral lean (due to hip pain)     ADL either performed or assessed with clinical judgement   ADL Overall ADL's : At baseline;Needs assistance/impaired     Toilet Transfer: Total assistance;RW Toilet Transfer Details (indicate cue type and reason): See PT note. Pt unable to follow directions for safety and sequencing requiring PT to complete transfer. Toileting- Clothing Manipulation and Hygiene: Total assistance;Bed level;Sit to/from stand         General ADL Comments: In regards to basic ADL tasks such as bathing, dressing, grooming, and self-feeding patient is at baseline as she received assistance with all these areas prior to fall.     Vision Baseline Vision/History: No visual deficits Patient Visual Report: No change from baseline              Pertinent Vitals/Pain Pain Assessment: Faces Faces Pain Scale: Hurts even more Pain Location: left hip, sacral area Pain Descriptors / Indicators: Guarding;Grimacing;Sore Pain Intervention(s): Limited activity within patient's tolerance;Monitored during session;Repositioned  Hand Dominance Right   Extremity/Trunk Assessment Upper Extremity Assessment Upper Extremity Assessment: RUE deficits/detail;LUE deficits/detail RUE Deficits / Details: History of right proximal humerus  fracture with baseline limitations in ROM and strength. LUE Deficits / Details: Demonstrated inadequate strength in LUE during transition from supine to seated. Required increased assistance to complete task.   Lower Extremity Assessment Lower Extremity Assessment: Defer to PT evaluation       Communication Communication Communication: No difficulties   Cognition Arousal/Alertness: Awake/alert Behavior During Therapy: WFL for tasks assessed/performed Overall Cognitive Status: History of cognitive impairments - at baseline        Exercises General Exercises - Lower Extremity Ankle Circles/Pumps: Seated;AROM;Both;10 reps Long Arc Quad: Seated;AAROM;AROM;Both;10 reps Hip Flexion/Marching: Seated;AROM;AAROM;Both;10 reps        Home Living Family/patient expects to be discharged to:: Skilled nursing facility Living Arrangements: Children Available Help at Discharge: Family Type of Home: House Home Access: Ramped entrance     [place]: Two level;Able to live on main level with bedroom/bathroom     Additional Comments: Patient is a poor historian of home set up and PLOF      Prior Functioning/Environment Level of Independence: Needs assistance  Gait / Transfers Assistance Needed: Utilizes walker for ambulation primarily. Has a single point cane which she's used in the past ADL's / Homemaking Assistance Needed: From previous therapy, Son assisted with ADL and homemaking tasks. An aide was also providing assistance when patient was seen in outpatient clinic. Unsure if this is still ongoing today.   Comments: Unable to provide any background information due to Dementia        OT Problem List: Decreased strength;Pain;Decreased activity tolerance;Decreased safety awareness;Decreased knowledge of use of DME or AE;Impaired balance (sitting and/or standing);Decreased knowledge of precautions      OT Treatment/Interventions: Self-care/ADL training;Therapeutic  exercise;Therapeutic activities;Neuromuscular education;Energy conservation;DME and/or AE instruction;Patient/family education;Balance training;Modalities;Manual therapy    OT Goals(Current goals can be found in the care plan section) Acute Rehab OT Goals Patient Stated Goal: None stated OT Goal Formulation: Patient unable to participate in goal setting Time For Goal Achievement: 07/03/20 Potential to Achieve Goals: Fair  OT Frequency: Min 2X/week    AM-PAC OT "6 Clicks" Daily Activity     Outcome Measure Help from another person eating meals?: A Little Help from another person taking care of personal grooming?: A Lot Help from another person toileting, which includes using toliet, bedpan, or urinal?: Total Help from another person bathing (including washing, rinsing, drying)?: Total Help from another person to put on and taking off regular upper body clothing?: Total Help from another person to put on and taking off regular lower body clothing?: Total 6 Click Score: 9   End of Session Equipment Utilized During Treatment: Rolling walker Nurse Communication: Other (comment) (Presence of new skin tear on right knee with need for wound care.)  Activity Tolerance: Patient limited by pain;Other (comment) (limited by cognition) Patient left: in chair;with call bell/phone within reach;with chair alarm set;Other (comment) (With physical therapist)  OT Visit Diagnosis: Unsteadiness on feet (R26.81);History of falling (Z91.81);Muscle weakness (generalized) (M62.81);Repeated falls (R29.6)                Time: 1660-6301 OT Time Calculation (min): 27 min Charges:  OT General Charges $OT Visit: 1 Visit OT Evaluation $OT Eval Low Complexity: [place], OTR/L,CBIS  [phone]   Kambrea Carrasco, Clarene Duke 06/19/2020, 11:55 AM

## 2020-06-19 NOTE — Progress Notes (Signed)
PROGRESS NOTE   Courtney [name]  TZG:[id number] DOB: 06-28-1929 DOA: 06/16/2020 PCP: Doree Albee, MD   Chief Complaint  Patient presents with  . Fall   Brief Admission History:  86 y.o. female, with past medical history significant for permanent A. fib, mitral valve replacement, chronic anticoagulation on Coumadin, pulmonary hypertension, CVA, vascular dementia, patient presents to ED secondary to fall, at baseline patient with poor balance, ambulates mainly with walker, she is with history of multiple falls in the past, she is on warfarin for mechanical heart valve, patient had mechanical fall this morning, no dizziness, no lightheadedness, no head trauma, she lost her balance while on the walker, son tried to catch her, he was unable to, patient landed on her bottom, and then fell backward, patient told son she was in pain, but was unable to locate where is the pain, so he brought her to ED for further evaluation.  In ED patient INR was therapeutic at 2.8, her CT pelvis was significant for left sacral/rami fracture, Per  ED discussion with Dr. Doreatha Martin, this is nonoperative management, recommendation for weightbearing as tolerated, so [facility] hospitalist consulted to admit.  Assessment & Plan:   Active Problems:   Vascular dementia, due to brain bleed from elevated INR, not due to ischemic strokes.    Essential hypertension   Chronic anticoagulation   S/P mitral valve replacement   Hypothyroidism   Chronic atrial fibrillation (HCC)   Dementia, multi-infarct (HCC)   Cardioembolic stroke (HCC)   P59 deficiency   Frequent falls   Closed fracture of multiple pubic rami, left, initial encounter ([place])   Metabolic encephalopathy   Fall with nondisplaced left sacral and pubic rami fracture -Patient with history of multiple falls in the past and fractures on multiple admissions, giving her age, frailty, history of CVA and severe deconditioning I worry about a severe bleeding complication from  another fall.  I have asked for a palliative care consultation.   -Ortho input appreciated, Dr. Doreatha Martin recommending nonoperative treatment, patient may be weightbearing as tolerated on bilateral lower extremities  PT recommending SNF.  -Continue to monitor CBC closely given evidence of surrounding small hematoma, and this patient was on warfarin and Plavix. -pain seems well controlled.   Vascular Dementia  -Pt remains more confused than her reported baseline but no behavioral disturbance -High risk for acute delirium.   -Delirium precautions ordered.   Acute blood loss anemia  - Hg has been holding stable.   Thrombocytopenia  - platelets have been stable  Chronic anticoagulation secondary to A. fib and mechanical mitral valve replacement - continue with warfarin, pharmacy to dose.  UTI/hematuria with increasing confusion from baseline -hopefully confusion will improve as UTI is controlled -completed ceftriaxone, following urine culture  Chronic Atrial fibrillation -Continue with atenolol for heart rate control -warfarin management per pharmacist.  Mechanical valve replacement -Continue with warfarin with target INR 2.5-3.5  Hypothyroidism -Continue with Synthroid  Hypertension -Continue with lisinopril and atenolol  DVT prophylaxis:  warfarin Code Status: DNR Family Communication: t/c to son [name] and cindy no answer, unable to leave msg 11/14 Disposition: SNF   Status is: inpatient   The patient will require care spanning > 2 midnights and should be moved to inpatient because: Unsafe d/c plan and Inpatient level of care appropriate due to severity of illness  Dispo: The patient is from: Home              Anticipated d/c is to: SNF  Anticipated d/c date is: 1 day              Patient currently is medically stable to d/c.  Consultants:   orthopedics  Procedures:     Antimicrobials:  Ceftriaxone IV 11/12>>   Subjective: Pt remains  confused but pleasant, she is more confused than baseline per family.  Pt has no specific complaints.   Objective: Vitals:   06/18/20 0638 06/18/20 1525 06/18/20 2013 06/19/20 0543  BP: 139/75 (!) 122/57 (!) 157/85 (!) 174/101  Pulse: 69 96 93 98  Resp: 16 18 20 18   Temp: 99.6 F (37.6 C) 98.1 F (36.7 C) 97.7 F (36.5 C) 99.4 F (37.4 C)  TempSrc: Axillary Oral Oral Axillary  SpO2: 97% 95% 98% 97%  Weight:      Height:        Intake/Output Summary (Last 24 hours) at 06/19/2020 1213 Last data filed at 06/19/2020 0600 Gross per 24 hour  Intake 590 ml  Output 450 ml  Net 140 ml   Filed Weights   06/16/20 1547 06/17/20 2337  Weight: 49.9 kg 52.2 kg    Examination:  General exam: elderly fraile female, Appears calm and comfortable  Respiratory system: Clear to auscultation. Respiratory effort normal. Cardiovascular system: normal S1 & S2 heard. No JVD, murmurs, rubs, gallops or clicks. No pedal edema. Gastrointestinal system: Abdomen is nondistended, soft and nontender. No organomegaly or masses felt. Normal bowel sounds heard. Central nervous system: Alert and confused. No focal neurological deficits. Extremities: Symmetric 5 x 5 power. Skin: No rashes, lesions or ulcers Psychiatry:  Mood & affect appropriate.   Data Reviewed: I have personally reviewed following labs and imaging studies  CBC: Recent Labs  Lab 06/16/20 1650 06/17/20 0351 06/18/20 0745 06/19/20 0722  WBC 8.5 6.1 6.5 7.2  NEUTROABS 7.1  --   --   --   HGB 11.9* 10.9* 10.0* 10.8*  HCT 38.5 35.1* 32.6* 34.3*  MCV 92.5 92.1 90.6 90.3  PLT 162 120* 107* 118*    Basic Metabolic Panel: Recent Labs  Lab 06/16/20 1650 06/17/20 0351  NA 141 138  K 4.1 4.2  CL 106 104  CO2 29 26  GLUCOSE 103* 113*  BUN 22 22  CREATININE 0.78 0.89  CALCIUM 9.6 9.2    GFR: Estimated Creatinine Clearance: 35.3 mL/min (by C-G formula based on SCr of 0.89 mg/dL).  Liver Function Tests: Recent Labs  Lab  06/16/20 1650  AST 28  ALT 26  ALKPHOS 67  BILITOT 0.8  PROT 6.6  ALBUMIN 4.0    CBG: No results for input(s): GLUCAP in the last 168 hours.  Recent Results (from the past 240 hour(s))  Respiratory Panel by RT PCR (Flu A&B, Covid) - Nasopharyngeal Swab     Status: None   Collection Time: 06/16/20 10:02 PM   Specimen: Nasopharyngeal Swab  Result Value Ref Range Status   SARS Coronavirus 2 by RT PCR NEGATIVE NEGATIVE Final    Comment: (NOTE) SARS-CoV-2 target nucleic acids are NOT DETECTED.  The SARS-CoV-2 RNA is generally detectable in upper respiratoy specimens during the acute phase of infection. The lowest concentration of SARS-CoV-2 viral copies this assay can detect is 131 copies/mL. A negative result does not preclude SARS-Cov-2 infection and should not be used as the sole basis for treatment or other patient management decisions. A negative result may occur with  improper specimen collection/handling, submission of specimen other than nasopharyngeal swab, presence of viral mutation(s) within the areas targeted by  this assay, and inadequate number of viral copies (<131 copies/mL). A negative result must be combined with clinical observations, patient history, and epidemiological information. The expected result is Negative.  Fact Sheet for Patients:  PinkCheek.be  Fact Sheet for Healthcare Providers:  GravelBags.it  This test is no t yet approved or cleared by the [place] FDA and  has been authorized for detection and/or diagnosis of SARS-CoV-2 by FDA under an Emergency Use Authorization (EUA). This EUA will remain  in effect (meaning this test can be used) for the duration of the COVID-19 declaration under Section 564(b)(1) of the Act, 21 U.S.C. section 360bbb-3(b)(1), unless the authorization is terminated or revoked sooner.     Influenza A by PCR NEGATIVE NEGATIVE Final   Influenza B by PCR  NEGATIVE NEGATIVE Final    Comment: (NOTE) The Xpert Xpress SARS-CoV-2/FLU/RSV assay is intended as an aid in  the diagnosis of influenza from Nasopharyngeal swab specimens and  should not be used as a sole basis for treatment. Nasal washings and  aspirates are unacceptable for Xpert Xpress SARS-CoV-2/FLU/RSV  testing.  Fact Sheet for Patients: PinkCheek.be  Fact Sheet for Healthcare Providers: GravelBags.it  This test is not yet approved or cleared by the [place] FDA and  has been authorized for detection and/or diagnosis of SARS-CoV-2 by  FDA under an Emergency Use Authorization (EUA). This EUA will remain  in effect (meaning this test can be used) for the duration of the  Covid-19 declaration under Section 564(b)(1) of the Act, 21  U.S.C. section 360bbb-3(b)(1), unless the authorization is  terminated or revoked. Performed at [facility], [address], [place], [place] [address]      Radiology Studies: No results found. Scheduled Meds: . vitamin C  250 mg Oral Daily  . atenolol  12.5 mg Oral Daily  . B-complex with vitamin C  1 tablet Oral Daily  . clopidogrel  75 mg Oral Daily  . docusate sodium  100 mg Oral BID  . levothyroxine  100 mcg Oral Q0600  . lisinopril  2.5 mg Oral Daily  . warfarin  3 mg Oral Once per day on Sun Tue Thu Sat  . warfarin  4 mg Oral Once per day on Mon Wed Fri  . Warfarin - Pharmacist Dosing Inpatient   Does not apply q1600   Continuous Infusions: . cefTRIAXone (ROCEPHIN)  IV Stopped (06/18/20 2210)    LOS: 2 days   Time spent:31 mins   Takai Chiaramonte Wynetta Emery, MD How to contact the [facility] Attending or Consulting provider [place] or covering provider during after hours [place], for this patient?  1. Check the care team in [facility] and look for a) attending/consulting [facility] provider listed and b) the [facility] team listed 2. Log into [email or web address] and use [place]'s universal password to access.  If you do not have the password, please contact the hospital operator. 3. Locate the [facility] provider you are looking for under [facility] and page to a number that you can be directly reached. 4. If you still have difficulty reaching the provider, please page the [facility] (Director on Call) for the Hospitalists listed on amion for assistance.  06/19/2020, 12:13 PM

## 2020-06-19 NOTE — Progress Notes (Signed)
ANTICOAGULATION CONSULT NOTE -   Pharmacy Consult for Warfarin Indication: PAF, MVR  Allergies  Allergen Reactions  . Penicillins Swelling    Has patient had a PCN reaction causing immediate rash, facial/tongue/throat swelling, SOB or lightheadedness with hypotension: Yes Has patient had a PCN reaction causing severe rash involving mucus membranes or skin necrosis: No Has patient had a PCN reaction that required hospitalization No Has patient had a PCN reaction occurring within the last 10 years: No If all of the above answers are "NO", then may proceed with Cephalosporin use. Whole body "swelled up" and had to go to the hospital    Patient Measurements: Height: 5\' 7"  (170.2 cm) Weight: 52.2 kg (115 lb 1.3 oz) IBW/kg (Calculated) : 61.6  Vital Signs: Temp: 99.4 F (37.4 C) (11/15 0543) Temp Source: Axillary (11/15 0543) BP: 174/101 (11/15 0543) Pulse Rate: 98 (11/15 0543)  Labs: Recent Labs    06/16/20 1650 06/16/20 1650 06/17/20 0351 06/17/20 0351 06/18/20 0745 06/19/20 0722  HGB 11.9*   < > 10.9*   < > 10.0* 10.8*  HCT 38.5   < > 35.1*  --  32.6* 34.3*  PLT 162   < > 120*  --  107* 118*  LABPROT 28.8*   < > 30.6*  --  27.6* 24.3*  INR 2.8*   < > 3.1*  --  2.7* 2.3*  CREATININE 0.78  --  0.89  --   --   --    < > = values in this interval not displayed.    Estimated Creatinine Clearance: 35.3 mL/min (by C-G formula based on SCr of 0.89 mg/dL).   Medical History: Past Medical History:  Diagnosis Date  . Biatrial enlargement 12/01/2015  . Chronic anticoagulation    coumadin, CHADS2VASC=6  . History of mitral valve replacement   . Hypertension   . Hypothyroidism   . Osteoporosis    on Boniva  . Permanent atrial fibrillation ([place])   . Pulmonary hypertension, moderate to severe ([place]) 12/01/2015  . Stroke [facility])    on Clopidrigel  . UTI (lower urinary tract infection) 07/2015   n&v; AKI  . Vascular dementia ([place])     Medications:  See electronic med  rec  Assessment: 86 y.o. F presents s/p fall.  Pt on warfarin PTA for afib and MVR. Admission INR 2.8. CBC ok on admission. Home dose: Warfarin 4mg  M/W/F and 3mg  all other days INR 2.3, plan to give 4mg  today  Goal of Therapy:   INR 2.5-3.5 Monitor platelets by anticoagulation protocol: Yes   Plan:  Daily INR warfarin 4mg  M/W/F and 3mg  all other days Monitor for S/S of bleeding  Isac Sarna, BS [place], BCPS Clinical Pharmacist Pager [phone] 06/19/2020,10:45 AM

## 2020-06-19 NOTE — Plan of Care (Signed)

## 2020-06-19 NOTE — NC FL2 (Signed)
[place] LEVEL OF CARE SCREENING TOOL     IDENTIFICATION  Patient Name: Courtney [name] Birthdate: [birth year] Sex: female Admission Date (Current Location): 06/16/2020  [facility] and [place] Number:  [employer] and Address:  [place] [address], [place]      Provider Number: [id number]  Attending Physician Name and Address:  Murlean Iba, MD  Relative Name and Phone Number:  [name], [name]) [phone] [facility])    Current Level of Care: Hospital Recommended Level of Care: [place] Prior Approval Number:    Date Approved/Denied:   PASRR Number: [id number]  Discharge Plan: SNF    Current Diagnoses: Patient Active Problem List   Diagnosis Date Noted  . Metabolic encephalopathy 59/29/2446  . Closed fracture of multiple pubic rami, left, initial encounter ([place]) 06/16/2020  . Frequent falls 05/14/2018  . Hypercalcemia 05/14/2018  . Closed fracture of right proximal humerus 05/14/2018  . Periorbital hematoma of right eye 05/14/2018  . Encounter for therapeutic drug monitoring 03/11/2018  . T12 compression fracture ([place]) 04/22/2017  . Dementia, multi-infarct ([place]) 04/22/2017  . Cardioembolic stroke ([place]) 28/63/8177  . B12 deficiency 04/22/2017  . Cough 12/16/2015  . Osteoporosis 12/05/2015  . Biatrial enlargement 12/01/2015  . Pulmonary hypertension, moderate to severe ([place]) 12/01/2015  . Closed fracture of intertrochanteric section of femur ([place])   . Chronic atrial fibrillation ([place]) 11/30/2015  . Hypothyroidism 11/29/2015  . Thrombocytopenia ([place]) 07/13/2015  . Chronic anticoagulation 07/13/2015  . S/P mitral valve replacement 07/13/2015  . Vascular dementia, due to brain bleed from elevated INR, not due to ischemic strokes.  07/12/2015  . Essential hypertension 07/12/2015    Orientation RESPIRATION BLADDER Height & Weight     Self  Normal Incontinent Weight: 115 lb 1.3 oz (52.2  kg) Height:  5\' 7"  (170.2 cm)  BEHAVIORAL SYMPTOMS/MOOD NEUROLOGICAL BOWEL NUTRITION STATUS      Incontinent Diet (heart healthy)  AMBULATORY STATUS COMMUNICATION OF NEEDS Skin   Extensive Assist Verbally Other (Comment) (skin tear pretibial right; anterior; lower healing skin tear with purelent drainage.)                       Personal Care Assistance Level of Assistance  Bathing, Feeding, Dressing Bathing Assistance: Maximum assistance Feeding assistance: Limited assistance Dressing Assistance: Maximum assistance     Functional Limitations Info  Sight, Speech, Hearing Sight Info: Adequate Hearing Info: Adequate Speech Info: Adequate    SPECIAL CARE FACTORS FREQUENCY  PT (By licensed PT), OT (By licensed OT)     PT Frequency: 5x/week OT Frequency: 3x/week            Contractures Contractures Info: Not present    Additional Factors Info  Code Status Code Status Info: DNR             Current Medications (06/19/2020):  This is the current hospital active medication list Current Facility-Administered Medications  Medication Dose Route Frequency Provider Last Rate Last Admin  . acetaminophen (TYLENOL) tablet 650 mg  650 mg Oral Q6H PRN Elgergawy, Silver Huguenin, MD   650 mg at 06/19/20 0546   Or  . acetaminophen (TYLENOL) suppository 650 mg  650 mg Rectal Q6H PRN Elgergawy, Silver Huguenin, MD      . ascorbic acid (VITAMIN C) tablet 250 mg  250 mg Oral Daily Elgergawy, Silver Huguenin, MD   250 mg at 06/19/20 0947  . atenolol (TENORMIN) tablet 12.5 mg  12.5 mg Oral  Daily Elgergawy, Silver Huguenin, MD   12.5 mg at 06/19/20 0948  . B-complex with vitamin C tablet 1 tablet  1 tablet Oral Daily Elgergawy, Silver Huguenin, MD   1 tablet at 06/19/20 0948  . clopidogrel (PLAVIX) tablet 75 mg  75 mg Oral Daily Elgergawy, Silver Huguenin, MD   75 mg at 06/19/20 0948  . docusate sodium (COLACE) capsule 100 mg  100 mg Oral BID Elgergawy, Silver Huguenin, MD   100 mg at 06/19/20 0947  . HYDROcodone-acetaminophen  (NORCO/VICODIN) 5-325 MG per tablet 1-2 tablet  1-2 tablet Oral Q6H PRN Johnson, Clanford L, MD      . labetalol (NORMODYNE) injection 5 mg  5 mg Intravenous Q2H PRN Wynetta Emery, Clanford L, MD   5 mg at 06/19/20 0546  . levothyroxine (SYNTHROID) tablet 100 mcg  100 mcg Oral Q0600 Wynetta Emery, Clanford L, MD   100 mcg at 06/19/20 0547  . lisinopril (ZESTRIL) tablet 2.5 mg  2.5 mg Oral Daily Elgergawy, Silver Huguenin, MD   2.5 mg at 06/19/20 0947  . warfarin (COUMADIN) tablet 3 mg  3 mg Oral Once per day on Sun Tue Thu Sat Franky Macho, RPH   3 mg at 06/18/20 1747  . warfarin (COUMADIN) tablet 4 mg  4 mg Oral Once per day on Mon Wed Fri Amend, Caron G, [place]      . Warfarin - Pharmacist Dosing Inpatient   Does not apply [place]         Discharge Medications: Please see discharge summary for a list of discharge medications.  Relevant Imaging Results:  Relevant Lab Results:   Additional Information SSN: [id number]. Patient is NOT vaccinated for COVID19  Deissy Guilbert, Clydene Pugh, LCSW

## 2020-06-19 NOTE — Consult Note (Signed)
Consultation Note Date: 06/19/2020   Patient Name: Courtney [name]  DOB: [birth year]  MRN: [id number]  Age / Sex: 86 y.o., female  PCP: Doree Albee, MD Referring Physician: Murlean Iba, MD  Reason for Consultation: Establishing goals of care and Psychosocial/spiritual support  HPI/Patient Profile: 86 y.o. female  with past medical history of A. fib chronic anticoagulation on Coumadin, mitral valve replacement, pulmonary hypertension, CVA, vascular dementia, history of UTIs, history of multiple falls in the past with fractures on multiple occasions, osteoporosis, hypothyroidism, HTN admitted on 06/16/2020 with fall with nondisplaced left sacral and pubic rami fracture.   Clinical Assessment and Goals of Care: Courtney [name] is sitting up in the [place] chair in her room.  Her breakfast tray is in front of her and it seems that she is eaten some of her food.  She greets me making and mostly keeping eye contact.  She has known dementia, and is oriented to self only.  She is able to make her basic needs known.  There is no family at bedside at this time.  I asked Courtney [name] what is happened, and she is able to tell me that she fell.  I shared that she has a fracture in her pelvis, and she nods in agreement.  We talked about her going to short-term rehab to try to get strength back, at this point she is agreeable.  Son is requesting short-term rehab at [facility], referral sent by [facility] team.  Conference with attending, bedside nursing staff, and transition of care team related to patient condition, needs, goals of care.  Outpatient palliative to follow.    HCPOA    NEXT OF KIN -son, Courtney [name].  Courtney [name] is divorced, but her son, Courtney [name], cared for both she and her former husband in his home until her former husband passed away.  Courtney [name] is her only living child.    SUMMARY OF RECOMMENDATIONS   At this point, continue to  treat the treatable but no CPR or intubation. Outpatient palliative services to follow.   Code Status/Advance Care Planning:  DNR  Symptom Management:   Per hospitalist, no additional needs at this time.  Palliative Prophylaxis:   Bowel Regimen, Frequent Pain Assessment and Palliative Wound Care  Additional Recommendations (Limitations, Scope, Preferences):  Treat the treatable but no CPR or intubation  Psycho-social/Spiritual:   Desire for further Chaplaincy support:no  Additional Recommendations: Caregiving  Support/Resources and Education on Hospice  Prognosis:   Unable to determine, based on outcomes.  6 months or less would not be surprising based on decreasing functional status, frailty, chronic illness burden.  Discharge Planning: At this point son is requesting short-term rehab.  Outpatient palliative to follow..  Return to home when possible.      Primary Diagnoses: Present on Admission: . B12 deficiency . Cardioembolic stroke ([place]) . Chronic atrial fibrillation ([place]) . Dementia, multi-infarct ([place]) . Essential hypertension . Hypothyroidism . Vascular dementia, due to brain bleed from elevated INR, not due to ischemic strokes.  . Closed fracture  of multiple pubic rami, left, initial encounter ([place]) . Metabolic encephalopathy   I have reviewed the medical record, interviewed the patient and family, and examined the patient. The following aspects are pertinent.  Past Medical History:  Diagnosis Date  . Biatrial enlargement 12/01/2015  . Chronic anticoagulation    coumadin, CHADS2VASC=6  . History of mitral valve replacement   . Hypertension   . Hypothyroidism   . Osteoporosis    on Boniva  . Permanent atrial fibrillation ([place])   . Pulmonary hypertension, moderate to severe ([place]) 12/01/2015  . Stroke [facility])    on Clopidrigel  . UTI (lower urinary tract infection) 07/2015   n&v; AKI  . Vascular dementia [facility])    Social History   Socioeconomic  History  . Marital status: Divorced    Spouse name: Not on file  . Number of children: Not on file  . Years of education: Not on file  . Highest education level: Not on file  Occupational History  . Occupation: Retired  Tobacco Use  . Smoking status: Former Smoker    Types: Cigarettes  . Smokeless tobacco: Never Used  Vaping Use  . Vaping Use: Never used  Substance and Sexual Activity  . Alcohol use: No  . Drug use: No  . Sexual activity: Not Currently    Birth control/protection: Surgical    Comment: hyst  Other Topics Concern  . Not on file  Social History Narrative   Lives with son when in [place]. Has a house in [place], [place]. Husband died.    Had a son that committed suicide. Has dementia.    Social Determinants of Health   Financial Resource Strain:   . Difficulty of Paying Living Expenses: Not on file  Food Insecurity:   . Worried About Charity fundraiser in the Last Year: Not on file  . Ran Out of Food in the Last Year: Not on file  Transportation Needs:   . Lack of Transportation (Medical): Not on file  . Lack of Transportation (Non-Medical): Not on file  Physical Activity:   . Days of Exercise per Week: Not on file  . Minutes of Exercise per Session: Not on file  Stress:   . Feeling of Stress : Not on file  Social Connections:   . Frequency of Communication with Friends and Family: Not on file  . Frequency of Social Gatherings with Friends and Family: Not on file  . Attends Religious Services: Not on file  . Active Member of Clubs or Organizations: Not on file  . Attends Archivist Meetings: Not on file  . Marital Status: Not on file   Family History  Problem Relation Age of Onset  . Heart disease Mother   . Heart disease Father   . Suicidality Son   . Stroke Neg Hx   . Cancer Neg Hx   . Diabetes Neg Hx    Scheduled Meds: . vitamin C  250 mg Oral Daily  . atenolol  12.5 mg Oral Daily  . B-complex with vitamin C  1 tablet Oral Daily    . clopidogrel  75 mg Oral Daily  . docusate sodium  100 mg Oral BID  . levothyroxine  100 mcg Oral Q0600  . lisinopril  2.5 mg Oral Daily  . warfarin  3 mg Oral Once per day on Sun Tue Thu Sat  . warfarin  4 mg Oral Once per day on Mon Wed Fri  . Warfarin - Pharmacist Dosing Inpatient  Does not apply q1600   Continuous Infusions: PRN Meds:.acetaminophen **OR** acetaminophen, HYDROcodone-acetaminophen, labetalol Medications Prior to Admission:  Prior to Admission medications   Medication Sig Start Date End Date Taking? Authorizing Provider  acetaminophen (TYLENOL) 500 MG tablet Take 500 mg by mouth every 6 (six) hours as needed for mild pain.   Yes [provider]  alendronate (FOSAMAX) 70 MG tablet TAKE 1 TABLET BY MOUTH EVERY WEDNESDAY Patient taking differently: Take 70 mg by mouth once a week.  09/01/19  Yes Gosrani, Nimish C, MD  atenolol (TENORMIN) 25 MG tablet Take 12.5 mg by mouth daily.   Yes [provider]  b complex vitamins tablet Take 1 tablet by mouth daily.   Yes [provider]  clopidogrel (PLAVIX) 75 MG tablet TAKE 1 TABLET BY MOUTH EVERY DAY 03/22/20  Yes Ailene Ards, NP  levothyroxine (SYNTHROID) 100 MCG tablet TAKE 1 TABLET BY MOUTH EVERY DAY BEFORE BREAKFAST Patient taking differently: Take 100 mcg by mouth daily before breakfast.  06/05/20  Yes Ailene Ards, NP  lisinopril (ZESTRIL) 2.5 MG tablet Take 1 tablet (2.5 mg total) by mouth daily. 01/11/20  Yes Ailene Ards, NP  vitamin C (ASCORBIC ACID) 250 MG tablet Take 250 mg by mouth daily.    Yes [provider]  HYDROcodone-acetaminophen (NORCO/VICODIN) 5-325 MG tablet Take 1 tablet by mouth every 6 (six) hours as needed for up to 5 days for severe pain. 06/19/20 06/24/20  Johnson, Clanford L, MD  VITAMIN D, ERGOCALCIFEROL, PO Take 250 mg by mouth. Three times a week Patient not taking: Reported on 06/16/2020    [provider]  warfarin (COUMADIN) 2 MG tablet Take 4 mg  Mon, Wed, and Friday all other days take 3 mg 06/19/20   Murlean Iba, MD  warfarin (COUMADIN) 3 MG tablet Take 4 mg Mon, Wed, and Friday all other days take 3 mg 06/19/20   Johnson, Clanford L, MD  alendronate (FOSAMAX) 70 MG tablet once a week.  05/29/18   [provider]  amLODipine (NORVASC) 2.5 MG tablet Take 1 tablet (2.5 mg total) by mouth daily. 07/06/19   Doree Albee, MD  atenolol (TENORMIN) 25 MG tablet Take 0.5 tablets (12.5 mg total) by mouth daily. 02/10/18   Caren Macadam, MD  clopidogrel (PLAVIX) 75 MG tablet Take 1 tablet (75 mg total) by mouth daily. 02/10/18   Caren Macadam, MD  clopidogrel (PLAVIX) 75 MG tablet Take 1 tablet (75 mg total) by mouth daily. 06/20/19   Doree Albee, MD  clopidogrel (PLAVIX) 75 MG tablet TAKE 1 TABLET BY MOUTH EVERY DAY 10/14/19   Hurshel Party C, MD  warfarin (COUMADIN) 2 MG tablet Take 1 tablet (2 mg total) by mouth daily. 04/27/19   Doree Albee, MD  warfarin (COUMADIN) 2 MG tablet TAKE 1 TABLET BY MOUTH EVERY DAY 07/22/19   Doree Albee, MD   Allergies  Allergen Reactions  . Penicillins Swelling    Has patient had a PCN reaction causing immediate rash, facial/tongue/throat swelling, SOB or lightheadedness with hypotension: Yes Has patient had a PCN reaction causing severe rash involving mucus membranes or skin necrosis: No Has patient had a PCN reaction that required hospitalization No Has patient had a PCN reaction occurring within the last 10 years: No If all of the above answers are "NO", then may proceed with Cephalosporin use. Whole body "swelled up" and had to go to the hospital   Review of Systems  Unable  to perform ROS: Dementia    Physical Exam Vitals reviewed.  Constitutional:      General: She is not in acute distress.    Appearance: Normal appearance.  HENT:     Head: Normocephalic and atraumatic.     Mouth/Throat:     Mouth: Mucous membranes are moist.  Cardiovascular:     Rate and  Rhythm: Normal rate.  Pulmonary:     Effort: Pulmonary effort is normal. No respiratory distress.  Abdominal:     Palpations: Abdomen is soft.  Skin:    General: Skin is warm and dry.  Neurological:     Mental Status: She is alert.     Comments: Known dementia  Psychiatric:     Comments: Calm and cooperative, not fearful     Vital Signs: BP (!) 174/101 (BP Location: Left Arm)   Pulse 98   Temp 99.4 F (37.4 C) (Axillary)   Resp 18   Ht 5\' 7"  (1.702 m)   Wt 52.2 kg   SpO2 97%   BMI 18.02 kg/m  Pain Scale: 0-10   Pain Score: Asleep   SpO2: SpO2: 97 % O2 Device:SpO2: 97 % O2 Flow Rate: .O2 Flow Rate (L/min): 1.5 L/min  IO: Intake/output summary:   Intake/Output Summary (Last 24 hours) at 06/19/2020 1301 Last data filed at 06/19/2020 0600 Gross per 24 hour  Intake 590 ml  Output 450 ml  Net 140 ml    LBM: Last BM Date: 06/18/20 Baseline Weight: Weight: 49.9 kg Most recent weight: Weight: 52.2 kg     Palliative Assessment/Data:   Flowsheet Rows     Most Recent Value  Intake Tab  Referral Department Hospitalist  Unit at Time of Referral Med/Surg Unit  Palliative Care Primary Diagnosis Trauma  Date Notified 06/17/20  Palliative Care Type New Palliative care  Reason for referral Clarify Goals of Care  Date of Admission 06/16/20  Date first seen by Palliative Care 06/19/20  # of days Palliative referral response time 2 Day(s)  # of days IP prior to Palliative referral 1  Clinical Assessment  Palliative Performance Scale Score 30%  Pain Max last 24 hours Not able to report  Pain Min Last 24 hours Not able to report  Dyspnea Max Last 24 Hours Not able to report  Dyspnea Min Last 24 hours Not able to report  Psychosocial & Spiritual Assessment  Palliative Care Outcomes      Time In: 0910 Time Out: 0940 Time Total: 30 minutes   Greater than 50%  of this time was spent counseling and coordinating care related to the above assessment and plan.  Signed  by: Drue Novel, NP   Please contact Palliative Medicine Team phone at [phone] for questions and concerns.  For individual provider: See Shea Evans

## 2020-06-19 NOTE — Progress Notes (Signed)
Physical Therapy Treatment Patient Details Name: Almedia [name] MRN: [id number] DOB: 22-Mar-1929 Today's Date: 06/19/2020    History of Present Illness  Courtney [name]  is a 86 y.o. female, with past medical history significant for permanent A. fib, mitral valve replacement, chronic anticoagulation on Coumadin, pulmonary hypertension, CVA, vascular dementia, patient presents to ED secondary to fall, at baseline patient with poor balance, ambulates mainly with walker, she is with history of multiple falls in the past, she is on warfarin for mechanical heart valve, patient had mechanical fall this morning, no dizziness, no lightheadedness, no head trauma, she lost her balance while on the walker, son tried to catch her, he was unable to, patient landed on her bottom, and then fell backward, patient told son she was in pain, but was unable to locate where is the pain, so he brought her to ED for further evaluation.    PT Comments    Patient presents seated at bedside with OT assisting, demonstrating fair/good sitting balance.  During sit to stands patient tends to resist movement due to increasing left sacral/hip pain resulting in poor carryover for using RW, once on feet limited to a couple of shuffling steps to transfer to chair with Max assist, and tolerated sitting up in chair after therapy.  Patient unable to use RW when put back to bed requiring Max assist stand pivot with knees blocked and Max/total assist to reposition in bed.  Patient will benefit from continued physical therapy in hospital and recommended venue below to increase strength, balance, endurance for safe ADLs and gait.   Follow Up Recommendations  SNF     Equipment Recommendations  None recommended by PT    Recommendations for Other Services       Precautions / Restrictions Precautions Precautions: Fall Restrictions Weight Bearing Restrictions: No RLE Weight Bearing: Weight bearing as tolerated LLE Weight Bearing: Weight bearing  as tolerated    Mobility  Bed Mobility Overal bed mobility: Needs Assistance Bed Mobility: Supine to Sit;Sit to Supine     Supine to sit: Max assist Sit to supine: Max assist   General bed mobility comments: slow labored movement with c/o severe pain left hip/sacral area  Transfers Overall transfer level: Needs assistance Equipment used: Rolling walker (2 wheeled);1 person hand held assist Transfers: Sit to/from [facility] Sit to Stand: Max assist Stand pivot transfers: Max assist       General transfer comment: poor carryover for using RW, tends to resist movement during sit to stands requiring Max verbal/tactile cueing to push down on RW  Ambulation/Gait Ambulation/Gait assistance: Max Web designer (Feet): 2 Feet Assistive device: Rolling walker (2 wheeled) Gait Pattern/deviations: Decreased step length - left;Decreased stance time - right;Decreased stride length;Shuffle Gait velocity: slow   General Gait Details: limited to 2 unsteady labored side steps with mostly suffling on feet due to severe left hip pain   Stairs             Wheelchair Mobility    Modified Rankin (Stroke Patients Only)       Balance Overall balance assessment: Needs assistance Sitting-balance support: Feet supported;No upper extremity supported Sitting balance-Leahy Scale: Fair Sitting balance - Comments: seated EOB   Standing balance support: During functional activity;Bilateral upper extremity supported Standing balance-Leahy Scale: Poor Standing balance comment: using RW                            Cognition Arousal/Alertness: Awake/alert Behavior  During Therapy: WFL for tasks assessed/performed Overall Cognitive Status: History of cognitive impairments - at baseline                                        Exercises General Exercises - Lower Extremity Ankle Circles/Pumps: Seated;AROM;Both;10 reps Long Arc Quad:  Seated;AAROM;AROM;Both;10 reps Hip Flexion/Marching: Seated;AROM;AAROM;Both;10 reps    General Comments        Pertinent Vitals/Pain Pain Assessment: Faces Faces Pain Scale: Hurts even more Pain Location: left hip, sacral area Pain Descriptors / Indicators: Guarding;Grimacing;Sore Pain Intervention(s): Limited activity within patient's tolerance;Monitored during session;Repositioned    Home Living                      Prior Function            PT Goals (current goals can now be found in the care plan section) Acute Rehab PT Goals Patient Stated Goal: Return home PT Goal Formulation: With patient Time For Goal Achievement: 07/01/20 Potential to Achieve Goals: Fair Progress towards PT goals: Progressing toward goals    Frequency    Min 3X/week      PT Plan Current plan remains appropriate    Co-evaluation              AM-PAC PT "6 Clicks" Mobility   Outcome Measure  Help needed turning from your back to your side while in a flat bed without using bedrails?: A Lot Help needed moving from lying on your back to sitting on the side of a flat bed without using bedrails?: A Lot Help needed moving to and from a bed to a chair (including a wheelchair)?: A Lot Help needed standing up from a chair using your arms (e.g., wheelchair or bedside chair)?: A Lot Help needed to walk in hospital room?: Total Help needed climbing 3-5 steps with a railing? : Total 6 Click Score: 10    End of Session Equipment Utilized During Treatment: Oxygen Activity Tolerance: Patient limited by pain;Patient limited by fatigue Patient left: in chair;with call bell/phone within reach;with chair alarm set Nurse Communication: Mobility status PT Visit Diagnosis: Unsteadiness on feet (R26.81);Other abnormalities of gait and mobility (R26.89);Muscle weakness (generalized) (M62.81);History of falling (Z91.81)     Time: 8921-1941 PT Time Calculation (min) (ACUTE ONLY): 33  min  Charges:  $Therapeutic Exercise: 8-22 mins $Therapeutic Activity: 8-22 mins                     11:37 AM, 06/19/20 Lonell Grandchild, MPT Physical Therapist with [facility] [phone] office [phone] mobile phone

## 2020-06-19 NOTE — TOC Progression Note (Signed)
Transition of Care [facility]) - Progression Note    Patient Details  Name: Courtney [name] MRN: [id number] Date of Birth: 30-Sep-1928  Transition of Care [facility]) CM/SW Contact  Ihor Gully, LCSW Phone Number: 06/19/2020, 11:50 AM  Clinical Narrative:    Patient has been residing with son, Waunita Schooner, since 2016. She ambulates with a four-wheeled walker at baseline. She requires assistance with activities of daily living and eating. Waunita Schooner is agreeable to SNF. Patient has been at [facility] in the past. Reviewed Medicare SNF ratings for facilities in this area. Referrals made to facilities requested.      Barriers to Discharge: Continued Medical Work up  Expected Discharge Plan and Services                                                 Social Determinants of Health (SDOH) Interventions    Readmission Risk Interventions No flowsheet data found.

## 2020-06-19 NOTE — Plan of Care (Signed)
  Problem: Acute Rehab OT Goals (only OT should resolve) Goal: Pt. Will Transfer To Toilet Flowsheets (Taken 06/19/2020 1200) Pt Will Transfer to Toilet:  with mod assist  stand pivot transfer  bedside commode  ambulating Goal: OT Additional ADL Goal #1 Flowsheets (Taken 06/19/2020 1200) Additional ADL Goal #1: Patient will increase her static sitting balance without requiring support from a single upper extremity for more than 10 minutes while completing an ADL task while utilizing pain managment techniques as needed.

## 2020-06-20 ENCOUNTER — Other Ambulatory Visit (INDEPENDENT_AMBULATORY_CARE_PROVIDER_SITE_OTHER): Payer: Self-pay | Admitting: Nurse Practitioner

## 2020-06-20 DIAGNOSIS — Z515 Encounter for palliative care: Secondary | ICD-10-CM

## 2020-06-20 DIAGNOSIS — Z7189 Other specified counseling: Secondary | ICD-10-CM

## 2020-06-20 DIAGNOSIS — G9341 Metabolic encephalopathy: Secondary | ICD-10-CM

## 2020-06-20 DIAGNOSIS — I482 Chronic atrial fibrillation, unspecified: Secondary | ICD-10-CM

## 2020-06-20 LAB — CBC
HCT: 33.4 % — ABNORMAL LOW (ref 36.0–46.0)
Hemoglobin: 10.4 g/dL — ABNORMAL LOW (ref 12.0–15.0)
MCH: 28.7 pg (ref 26.0–34.0)
MCHC: 31.1 g/dL (ref 30.0–36.0)
MCV: 92.3 fL (ref 80.0–100.0)
Platelets: 145 10*3/uL — ABNORMAL LOW (ref 150–400)
RBC: 3.62 MIL/uL — ABNORMAL LOW (ref 3.87–5.11)
RDW: 15 % (ref 11.5–15.5)
WBC: 6.7 10*3/uL (ref 4.0–10.5)
nRBC: 0 % (ref 0.0–0.2)

## 2020-06-20 LAB — PROTIME-INR
INR: 2.7 — ABNORMAL HIGH (ref 0.8–1.2)
Prothrombin Time: 27.8 seconds — ABNORMAL HIGH (ref 11.4–15.2)

## 2020-06-20 LAB — SARS CORONAVIRUS 2 BY RT PCR (HOSPITAL ORDER, PERFORMED IN ~~LOC~~ HOSPITAL LAB): SARS Coronavirus 2: NEGATIVE

## 2020-06-20 MED ORDER — AMLODIPINE BESYLATE 5 MG PO TABS: 5.0000 mg | ORAL_TABLET | Freq: Every day | ORAL | Status: AC

## 2020-06-20 MED ORDER — AMLODIPINE BESYLATE 5 MG PO TABS
5.0000 mg | ORAL_TABLET | Freq: Every day | ORAL | Status: DC
  Administered 2020-06-20: 5 mg via ORAL
  Filled 2020-06-20: qty 1

## 2020-06-20 NOTE — Care Management Important Message (Signed)
Important Message  Patient Details  Name: Courtney [name] MRN: [id number] Date of Birth: May 22, 1929   Medicare Important Message Given:  Yes     KIYA ENO 06/20/2020, 12:44 PM

## 2020-06-20 NOTE — Plan of Care (Signed)

## 2020-06-20 NOTE — NC FL2 (Signed)
[place] LEVEL OF CARE SCREENING TOOL     IDENTIFICATION  Patient Name: Courtney [name] Birthdate: [birth year] Sex: female Admission Date (Current Location): 06/16/2020  [facility] and [place] Number:  [employer] and Address:  [facility] [address], [place]      Provider Number: [id number]  Attending Physician Name and Address:  Murlean Iba, MD  Relative Name and Phone Number:  [place], [place]) [phone] [facility])    Current Level of Care: Hospital Recommended Level of Care: [place] Prior Approval Number:    Date Approved/Denied:   PASRR Number: [id number]  Discharge Plan: SNF    Current Diagnoses: Patient Active Problem List   Diagnosis Date Noted  . Goals of care, counseling/discussion   . Palliative care by specialist   . Metabolic encephalopathy 30/94/0768  . Closed fracture of multiple pubic rami, left, initial encounter ([place]) 06/16/2020  . Frequent falls 05/14/2018  . Hypercalcemia 05/14/2018  . Closed fracture of right proximal humerus 05/14/2018  . Periorbital hematoma of right eye 05/14/2018  . Encounter for therapeutic drug monitoring 03/11/2018  . T12 compression fracture ([place]) 04/22/2017  . Dementia, multi-infarct ([place]) 04/22/2017  . Cardioembolic stroke ([place]) 08/81/1031  . B12 deficiency 04/22/2017  . Cough 12/16/2015  . Osteoporosis 12/05/2015  . Biatrial enlargement 12/01/2015  . Pulmonary hypertension, moderate to severe ([place]) 12/01/2015  . Closed fracture of intertrochanteric section of femur ([place])   . Chronic atrial fibrillation ([place]) 11/30/2015  . Hypothyroidism 11/29/2015  . Thrombocytopenia ([place]) 07/13/2015  . Chronic anticoagulation 07/13/2015  . S/P mitral valve replacement 07/13/2015  . Vascular dementia, due to brain bleed from elevated INR, not due to ischemic strokes.  07/12/2015  . Essential hypertension 07/12/2015    Orientation RESPIRATION BLADDER Height  & Weight     Self  O2 (1L) Incontinent Weight: 115 lb 1.3 oz (52.2 kg) Height:  5\' 7"  (170.2 cm)  BEHAVIORAL SYMPTOMS/MOOD NEUROLOGICAL BOWEL NUTRITION STATUS      Incontinent Diet (heart healthy)  AMBULATORY STATUS COMMUNICATION OF NEEDS Skin   Extensive Assist Verbally Other (Comment) (skin tear pretibial right; anterior; lower healing skin tear with purelent drainage.)                       Personal Care Assistance Level of Assistance  Bathing, Feeding, Dressing Bathing Assistance: Maximum assistance Feeding assistance: Limited assistance Dressing Assistance: Maximum assistance     Functional Limitations Info  Sight, Speech, Hearing Sight Info: Adequate Hearing Info: Adequate Speech Info: Adequate    SPECIAL CARE FACTORS FREQUENCY  PT (By licensed PT), OT (By licensed OT)     PT Frequency: 5x/week OT Frequency: 3x/week            Contractures Contractures Info: Not present    Additional Factors Info  Code Status Code Status Info: DNR             Current Medications (06/20/2020):  This is the current hospital active medication list Current Facility-Administered Medications  Medication Dose Route Frequency Provider Last Rate Last Admin  . acetaminophen (TYLENOL) tablet 650 mg  650 mg Oral Q6H PRN Elgergawy, Silver Huguenin, MD   650 mg at 06/19/20 0546   Or  . acetaminophen (TYLENOL) suppository 650 mg  650 mg Rectal Q6H PRN Elgergawy, Silver Huguenin, MD      . amLODipine (NORVASC) tablet 5 mg  5 mg Oral Daily Johnson, Clanford L, MD   5 mg at  06/20/20 0909  . ascorbic acid (VITAMIN C) tablet 250 mg  250 mg Oral Daily Elgergawy, Silver Huguenin, MD   250 mg at 06/20/20 0907  . atenolol (TENORMIN) tablet 12.5 mg  12.5 mg Oral Daily Elgergawy, Silver Huguenin, MD   12.5 mg at 06/20/20 0907  . B-complex with vitamin C tablet 1 tablet  1 tablet Oral Daily Elgergawy, Silver Huguenin, MD   1 tablet at 06/20/20 0906  . clopidogrel (PLAVIX) tablet 75 mg  75 mg Oral Daily Elgergawy, Silver Huguenin, MD    75 mg at 06/20/20 0908  . docusate sodium (COLACE) capsule 100 mg  100 mg Oral BID Elgergawy, Silver Huguenin, MD   100 mg at 06/20/20 0906  . HYDROcodone-acetaminophen (NORCO/VICODIN) 5-325 MG per tablet 1-2 tablet  1-2 tablet Oral Q6H PRN Murlean Iba, MD   1 tablet at 06/20/20 0846  . labetalol (NORMODYNE) injection 5 mg  5 mg Intravenous Q2H PRN Wynetta Emery, Clanford L, MD   5 mg at 06/19/20 0546  . levothyroxine (SYNTHROID) tablet 100 mcg  100 mcg Oral Q0600 Irwin Brakeman L, MD   100 mcg at 06/20/20 0619  . lisinopril (ZESTRIL) tablet 2.5 mg  2.5 mg Oral Daily Elgergawy, Silver Huguenin, MD   2.5 mg at 06/20/20 0906  . warfarin (COUMADIN) tablet 3 mg  3 mg Oral Once per day on Sun Tue Thu Sat Franky Macho, RPH   3 mg at 06/18/20 1747  . warfarin (COUMADIN) tablet 4 mg  4 mg Oral Once per day on Mon Wed Fri Franky Macho, RPH   4 mg at 06/19/20 1647  . Warfarin - Pharmacist Dosing Inpatient   Does not apply q1600 Franky Macho [facility]   Given at 06/19/20 1650     Discharge Medications: Please see discharge summary for a list of discharge medications.  Relevant Imaging Results:  Relevant Lab Results:   Additional Information SSN: [id number]. Patient is NOT vaccinated for COVID19  Alasdair Kleve, Clydene Pugh, LCSW

## 2020-06-20 NOTE — Progress Notes (Signed)
Updates son, [name] that EMS is transporting pt to [facility] at this time.

## 2020-06-20 NOTE — Progress Notes (Signed)
ANTICOAGULATION CONSULT NOTE -   Pharmacy Consult for Warfarin Indication: PAF, MVR  Patient Measurements: Height: 5\' 7"  (170.2 cm) Weight: 52.2 kg (115 lb 1.3 oz) IBW/kg (Calculated) : 61.6  Vital Signs: Temp: 98.5 F (36.9 C) (11/16 0800) Temp Source: Oral (11/16 0800) BP: 122/71 (11/16 0909) Pulse Rate: 78 (11/16 0909)  Labs: Recent Labs    06/18/20 0745 06/18/20 0745 06/19/20 0722 06/20/20 0751  HGB 10.0*   < > 10.8* 10.4*  HCT 32.6*  --  34.3* 33.4*  PLT 107*  --  118* 145*  LABPROT 27.6*  --  24.3* 27.8*  INR 2.7*  --  2.3* 2.7*   < > = values in this interval not displayed.    Estimated Creatinine Clearance: 35.3 mL/min (by C-G formula based on SCr of 0.89 mg/dL).   Assessment: 86 y.o. F presents s/p fall.  Pt on warfarin PTA for afib and MVR. Admission INR 2.8. CBC ok on admission. Home dose: Warfarin 4mg  M/W/F and 3mg  all other days INR 2.3, plan to give 4mg  today  Goal of Therapy:   INR 2.5-3.5 Monitor platelets by anticoagulation protocol: Yes   Plan:   warfarin  3mg  today for INR 2.7 Daily INR and every other day CBC. Monitor for S/S of bleeding   Despina Pole, Pharm. D. Clinical Pharmacist 06/20/2020 11:20 AM

## 2020-06-20 NOTE — TOC Transition Note (Signed)
Transition of Care [facility]) - CM/SW Discharge Note   Patient Details  Name: Courtney [name] MRN: [id number] Date of Birth: [birth year]  Transition of Care [facility]) CM/SW Contact:  Ihor Gully, LCSW Phone Number: 06/20/2020, 2:53 PM   Clinical Narrative:    Discharge clinicals sent to facility. RN Network engineer to call EMS when patient is ready. TOC signing off.    Final next level of care: Skilled Nursing Facility Barriers to Discharge: No Barriers Identified   Patient Goals and CMS Choice Patient states their goals for this hospitalization and ongoing recovery are:: rehab and home with family   Choice offered to / list presented to : Adult Children  Discharge Placement              Patient chooses bed at: [facility] Patient to be transferred to facility by: [place] Name of family member notified: Theone Murdoch, son Patient and family notified of of transfer: 06/20/20  Discharge Plan and Services                                     Social Determinants of Health (SDOH) Interventions     Readmission Risk Interventions No flowsheet data found.

## 2020-06-20 NOTE — Plan of Care (Signed)

## 2020-06-20 NOTE — Progress Notes (Signed)
SATURATION QUALIFICATIONS: (This note is used to comply with regulatory documentation for home oxygen)  Patient Saturations on Room Air at Rest = 87%  Patient Saturations on Room Air while Ambulating = pt unable to ambulate %  Patient Saturations on 1 Liters of oxygen while Ambulating = 92%  Please briefly explain why patient needs home oxygen:

## 2020-06-20 NOTE — Progress Notes (Signed)
Occupational Therapy Treatment Patient Details Name: Courtney [name] MRN: [id number] DOB: [birth year] Today's Date: 06/20/2020    History of present illness  Courtney [name]  is a 86 y.o. female, with past medical history significant for permanent A. fib, mitral valve replacement, chronic anticoagulation on Coumadin, pulmonary hypertension, CVA, vascular dementia, patient presents to ED secondary to fall, at baseline patient with poor balance, ambulates mainly with walker, she is with history of multiple falls in the past, she is on warfarin for mechanical heart valve, patient had mechanical fall this morning, no dizziness, no lightheadedness, no head trauma, she lost her balance while on the walker, son tried to catch her, he was unable to, patient landed on her bottom, and then fell backward, patient told son she was in pain, but was unable to locate where is the pain, so he brought her to ED for further evaluation.   OT comments  Pt agreeable to participate in OT session, reports no pain while resting supine. Pt grimacing and reporting soreness in sacral area upon beginning bed mobility, increased time to complete with verbal cuing for hand placement and to push up on elbow and hand into sitting. Pt able to complete with mod assist today. Pt able to tolerate sitting at EOB for approximately 3-4 minutes today, drinking water and adjusting gown while sitting up. Pt reporting increasing soreness and requesting to return to supine. Pt requiring min/mod assist for return. Discharge plan remains appropriate.    Follow Up Recommendations  SNF;Supervision/Assistance - 24 hour    Equipment Recommendations  None recommended by OT       Precautions / Restrictions Precautions Precautions: Fall Restrictions RLE Weight Bearing: Weight bearing as tolerated LLE Weight Bearing: Weight bearing as tolerated       Mobility Bed Mobility Overal bed mobility: Needs Assistance Bed Mobility: Supine to Sit;Sit to Supine      Supine to sit: Mod assist Sit to supine: Min assist;Mod assist             ADL either performed or assessed with clinical judgement   ADL Overall ADL's : At baseline;Needs assistance/impaired Eating/Feeding: Set up;Sitting Eating/Feeding Details (indicate cue type and reason): pt able to sit at EOB for ~3 minutes while drinking water                                   General ADL Comments: In regards to basic ADL tasks such as bathing, dressing, grooming, and self-feeding patient is at baseline as she received assistance with all these areas prior to fall.               Cognition Arousal/Alertness: Awake/alert Behavior During Therapy: WFL for tasks assessed/performed Overall Cognitive Status: History of cognitive impairments - at baseline                                                     Pertinent Vitals/ Pain       Pain Assessment: Faces Faces Pain Scale: Hurts a little bit Pain Location: left hip, sacral area Pain Descriptors / Indicators: Guarding;Grimacing;Sore Pain Intervention(s): Limited activity within patient's tolerance;Monitored during session;Repositioned         Frequency  Min 2X/week        Progress Toward Goals  OT Goals(current  goals can now be found in the care plan section)  Progress towards OT goals: Progressing toward goals  Acute Rehab OT Goals Patient Stated Goal: None stated OT Goal Formulation: Patient unable to participate in goal setting Time For Goal Achievement: 07/03/20 Potential to Achieve Goals: Fair ADL Goals Pt Will Transfer to Toilet: with mod assist;stand pivot transfer;bedside commode;ambulating Additional ADL Goal #1: Patient will increase her static sitting balance without requiring support from a single upper extremity for more than 10 minutes while completing an ADL task while utilizing pain managment techniques as needed.  Plan Discharge plan remains appropriate           End of Session Equipment Utilized During Treatment: Oxygen  OT Visit Diagnosis: Unsteadiness on feet (R26.81);History of falling (Z91.81);Muscle weakness (generalized) (M62.81);Repeated falls (R29.6)   Activity Tolerance Patient limited by pain;Other (comment) (limited by cognition)   Patient Left in bed;with call bell/phone within reach;with chair alarm set   Nurse Communication          Time: [id number] OT Time Calculation (min): 22 min  Charges: OT General Charges $OT Visit: 1 Visit OT Treatments $Therapeutic Activity: 8-22 mins    Guadelupe Sabin, OTR/L  [phone] 06/20/2020, 8:06 AM

## 2020-06-20 NOTE — Plan of Care (Signed)
Problem: Education: Goal: Knowledge of General Education information will improve Description: Including pain rating scale, medication(s)/side effects and non-pharmacologic comfort measures 06/20/2020 1502 by Cameron Ali, RN Outcome: Completed/Met 06/20/2020 0934 by Cameron Ali, RN Outcome: Progressing   Problem: Health Behavior/Discharge Planning: Goal: Ability to manage health-related needs will improve 06/20/2020 1502 by Cameron Ali, RN Outcome: Completed/Met 06/20/2020 0934 by Cameron Ali, RN Outcome: Progressing   Problem: Clinical Measurements: Goal: Ability to maintain clinical measurements within normal limits will improve 06/20/2020 1502 by Cameron Ali, RN Outcome: Completed/Met 06/20/2020 0934 by Cameron Ali, RN Outcome: Progressing Goal: Will remain free from infection 06/20/2020 1502 by Cameron Ali, RN Outcome: Completed/Met 06/20/2020 0934 by Cameron Ali, RN Outcome: Progressing Goal: Diagnostic test results will improve 06/20/2020 1502 by Cameron Ali, RN Outcome: Completed/Met 06/20/2020 0934 by Cameron Ali, RN Outcome: Progressing Goal: Respiratory complications will improve 06/20/2020 1502 by Cameron Ali, RN Outcome: Completed/Met 06/20/2020 0934 by Cameron Ali, RN Outcome: Progressing Goal: Cardiovascular complication will be avoided 06/20/2020 1502 by Cameron Ali, RN Outcome: Completed/Met 06/20/2020 0934 by Cameron Ali, RN Outcome: Progressing   Problem: Activity: Goal: Risk for activity intolerance will decrease 06/20/2020 1502 by Cameron Ali, RN Outcome: Completed/Met 06/20/2020 0934 by Cameron Ali, RN Outcome: Progressing   Problem: Nutrition: Goal: Adequate nutrition will be maintained 06/20/2020 1502 by Cameron Ali, RN Outcome: Completed/Met 06/20/2020 0934 by Cameron Ali, RN Outcome: Progressing   Problem: Coping: Goal: Level  of anxiety will decrease 06/20/2020 1502 by Cameron Ali, RN Outcome: Completed/Met 06/20/2020 0934 by Cameron Ali, RN Outcome: Progressing   Problem: Elimination: Goal: Will not experience complications related to bowel motility 06/20/2020 1502 by Cameron Ali, RN Outcome: Completed/Met 06/20/2020 0934 by Cameron Ali, RN Outcome: Progressing Goal: Will not experience complications related to urinary retention 06/20/2020 1502 by Cameron Ali, RN Outcome: Completed/Met 06/20/2020 0934 by Cameron Ali, RN Outcome: Progressing   Problem: Pain Managment: Goal: General experience of comfort will improve 06/20/2020 1502 by Cameron Ali, RN Outcome: Completed/Met 06/20/2020 0934 by Cameron Ali, RN Outcome: Progressing   Problem: Safety: Goal: Ability to remain free from injury will improve 06/20/2020 1502 by Cameron Ali, RN Outcome: Completed/Met 06/20/2020 0934 by Cameron Ali, RN Outcome: Progressing   Problem: Skin Integrity: Goal: Risk for impaired skin integrity will decrease 06/20/2020 1502 by Cameron Ali, RN Outcome: Completed/Met 06/20/2020 0934 by Cameron Ali, RN Outcome: Progressing

## 2020-06-20 NOTE — Progress Notes (Addendum)
Discharge instructions given to nurse Debroah Baller of [facility]. Answered all questions at this time. All belongings sent with patient and son. Per nurse [place], pt will have until 2000 to arrive or pt will not be able to be admitted. Await on EMS

## 2020-06-20 NOTE — Discharge Summary (Signed)
Physician Discharge Summary  Courtney [name] QMV:[id number] DOB: 23-Nov-1928 DOA: 06/16/2020  PCP: Doree Albee, MD  Admit date: 06/16/2020 Discharge date: 06/20/2020  Disposition: SNF   Recommendations for Outpatient Follow-up:  1. Follow up with orthopedics in 2-3 weeks 2. Follow up with PCP in 2-4 weeks 3. Please monitor PT/INR closely (at least every 3 days) with goal INR 2.5-3.5 4. Bleeding precautions while on anticoagulant therapy 5. Please monitor CBC in 1 week 6. Outpatient palliative medicine referral recommended 7. Weightbearing as tolerated on bilateral lower extremities  Discharge Condition: STABLE   CODE STATUS: DNR (Do not resuscitate)   Brief Hospitalization Summary: Please see all hospital notes, images, labs for full details of the hospitalization. Brief Admission History:  86 y.o.female,with past medical history significant for permanent A. fib, mitral valve replacement, chronic anticoagulation on Coumadin, pulmonary hypertension, CVA, vascular dementia,patient presents to ED secondary to fall, at baseline patient with poor balance, ambulates mainly with walker, she is with history of multiple falls in the past, she is on warfarin for mechanical heart valve, patient had mechanical fall this morning, no dizziness, no lightheadedness, no head trauma, she lost her balance while on the walker, son tried to catch her, he was unable to, patient landed on her bottom, and then fell backward, patient told son she was in pain, but was unable to locate where is the pain, so he brought her to ED for further evaluation.  In ED patient INR was therapeutic at 2.8, her CT pelvis was significant for left sacral/rami fracture,PerED discussion with Dr. Doreatha Martin, this is nonoperative management, recommendation for weightbearing as tolerated, so [facility] hospitalist consulted to admit.  Assessment & Plan:   Active Problems:   Vascular dementia, due to brain bleed from elevated INR, not due  to ischemic strokes.    Essential hypertension   Chronic anticoagulation   S/P mitral valve replacement   Hypothyroidism   Chronic atrial fibrillation    Dementia, multi-infarct    Cardioembolic stroke   M84 deficiency   Frequent falls   Closed fracture of multiple pubic rami, left, initial encounter   Metabolic encephalopathy  Hospital Course   Fall with nondisplaced left sacral and pubic rami fracture -Patient with history of multiple falls in the past and fractures on multiple admissions, giving her age, frailty, history of CVA and severe deconditioning I worry about a severe bleeding complication from another fall.  I asked for a palliative care consultation.    -Ortho input appreciated, Dr. Doreatha Martin recommending nonoperative treatment, patient may be weightbearing as tolerated on bilateral lower extremities  PT recommending SNF.  -pain seems well controlled.   Vascular Dementia  -Pt remains more confused than her reported baseline but no behavioral disturbance -High risk for acute delirium.   -Delirium precautions ordered.   Acute blood loss anemia  - Hg has been holding stable.   Thrombocytopenia  - platelets have been stable  Chronic anticoagulation secondary to A. fib and mechanical mitral valve replacement-continue with warfarin, pharmacy to dose.  UTI/hematuria with increasing confusion from baseline -completed ceftriaxone   Chronic Atrial fibrillation -Continue with atenolol for heart rate control -warfarin management per pharmacist.  Mechanical valve replacement -Continue with warfarin with target INR 2.5-3.5 -Please monitor PT/INR every 3 days or more frequently if not therapeutic  Hypothyroidism -Continue with Synthroid  Hypertension -Continue with lisinopril and atenolol and amlodipine  DVT prophylaxis:  warfarin Code Status: DNR Family Communication: t/c to son Courtney [name] and Courtney [name] no answer, unable to leave msg  11/14 Disposition: SNF    Discharge Diagnoses:  Active Problems:   Vascular dementia, due to brain bleed from elevated INR, not due to ischemic strokes.    Essential hypertension   Chronic anticoagulation   S/P mitral valve replacement   Hypothyroidism   Chronic atrial fibrillation (HCC)   Dementia, multi-infarct (HCC)   Cardioembolic stroke (HCC)   D32 deficiency   Frequent falls   Closed fracture of multiple pubic rami, left, initial encounter [facility])   Metabolic encephalopathy   Goals of care, counseling/discussion   Palliative care by specialist   Discharge Instructions: Discharge Instructions    AMB referral to orthopedics   Complete by: As directed      Allergies as of 06/20/2020      Reactions   Penicillins Swelling   Has patient had a PCN reaction causing immediate rash, facial/tongue/throat swelling, SOB or lightheadedness with hypotension: Yes Has patient had a PCN reaction causing severe rash involving mucus membranes or skin necrosis: No Has patient had a PCN reaction that required hospitalization No Has patient had a PCN reaction occurring within the last 10 years: No If all of the above answers are "NO", then may proceed with Cephalosporin use. Whole body "swelled up" and had to go to the hospital   Tramadol Hcl Anxiety   Per son      Medication List    STOP taking these medications   VITAMIN D (ERGOCALCIFEROL) PO     TAKE these medications   acetaminophen 500 MG tablet Commonly known as: TYLENOL Take 500 mg by mouth every 6 (six) hours as needed for mild pain.   alendronate 70 MG tablet Commonly known as: FOSAMAX TAKE 1 TABLET BY MOUTH EVERY WEDNESDAY What changed: See the new instructions.   amLODipine 5 MG tablet Commonly known as: NORVASC Take 1 tablet (5 mg total) by mouth daily.   atenolol 25 MG tablet Commonly known as: TENORMIN Take 12.5 mg by mouth daily.   b complex vitamins tablet Take 1 tablet by mouth daily.   clopidogrel 75 MG tablet Commonly known  as: PLAVIX TAKE 1 TABLET BY MOUTH EVERY DAY   HYDROcodone-acetaminophen 5-325 MG tablet Commonly known as: NORCO/VICODIN Take 1 tablet by mouth every 6 (six) hours as needed for up to 5 days for severe pain.   levothyroxine 100 MCG tablet Commonly known as: SYNTHROID TAKE 1 TABLET BY MOUTH EVERY DAY BEFORE BREAKFAST What changed: See the new instructions.   lisinopril 2.5 MG tablet Commonly known as: ZESTRIL TAKE 1 TABLET BY MOUTH EVERY DAY   vitamin C 250 MG tablet Commonly known as: ASCORBIC ACID Take 250 mg by mouth daily.   warfarin 2 MG tablet Commonly known as: COUMADIN Take as directed. If you are unsure how to take this medication, talk to your nurse or doctor. Original instructions: Take 4 mg Mon, Wed, and Friday all other days take 3 mg What changed:   how much to take  how to take this  when to take this  additional instructions   warfarin 3 MG tablet Commonly known as: COUMADIN Take as directed. If you are unsure how to take this medication, talk to your nurse or doctor. Original instructions: Take 4 mg Mon, Wed, and Friday all other days take 3 mg What changed: additional instructions       Allergies  Allergen Reactions  . Penicillins Swelling    Has patient had a PCN reaction causing immediate rash, facial/tongue/throat swelling, SOB or lightheadedness with hypotension: Yes  Has patient had a PCN reaction causing severe rash involving mucus membranes or skin necrosis: No Has patient had a PCN reaction that required hospitalization No Has patient had a PCN reaction occurring within the last 10 years: No If all of the above answers are "NO", then may proceed with Cephalosporin use. Whole body "swelled up" and had to go to the hospital  . Tramadol Hcl Anxiety    Per son   Allergies as of 06/20/2020      Reactions   Penicillins Swelling   Has patient had a PCN reaction causing immediate rash, facial/tongue/throat swelling, SOB or lightheadedness with  hypotension: Yes Has patient had a PCN reaction causing severe rash involving mucus membranes or skin necrosis: No Has patient had a PCN reaction that required hospitalization No Has patient had a PCN reaction occurring within the last 10 years: No If all of the above answers are "NO", then may proceed with Cephalosporin use. Whole body "swelled up" and had to go to the hospital   Tramadol Hcl Anxiety   Per son      Medication List    STOP taking these medications   VITAMIN D (ERGOCALCIFEROL) PO     TAKE these medications   acetaminophen 500 MG tablet Commonly known as: TYLENOL Take 500 mg by mouth every 6 (six) hours as needed for mild pain.   alendronate 70 MG tablet Commonly known as: FOSAMAX TAKE 1 TABLET BY MOUTH EVERY WEDNESDAY What changed: See the new instructions.   amLODipine 5 MG tablet Commonly known as: NORVASC Take 1 tablet (5 mg total) by mouth daily.   atenolol 25 MG tablet Commonly known as: TENORMIN Take 12.5 mg by mouth daily.   b complex vitamins tablet Take 1 tablet by mouth daily.   clopidogrel 75 MG tablet Commonly known as: PLAVIX TAKE 1 TABLET BY MOUTH EVERY DAY   HYDROcodone-acetaminophen 5-325 MG tablet Commonly known as: NORCO/VICODIN Take 1 tablet by mouth every 6 (six) hours as needed for up to 5 days for severe pain.   levothyroxine 100 MCG tablet Commonly known as: SYNTHROID TAKE 1 TABLET BY MOUTH EVERY DAY BEFORE BREAKFAST What changed: See the new instructions.   lisinopril 2.5 MG tablet Commonly known as: ZESTRIL TAKE 1 TABLET BY MOUTH EVERY DAY   vitamin C 250 MG tablet Commonly known as: ASCORBIC ACID Take 250 mg by mouth daily.   warfarin 2 MG tablet Commonly known as: COUMADIN Take as directed. If you are unsure how to take this medication, talk to your nurse or doctor. Original instructions: Take 4 mg Mon, Wed, and Friday all other days take 3 mg What changed:   how much to take  how to take this  when to take  this  additional instructions   warfarin 3 MG tablet Commonly known as: COUMADIN Take as directed. If you are unsure how to take this medication, talk to your nurse or doctor. Original instructions: Take 4 mg Mon, Wed, and Friday all other days take 3 mg What changed: additional instructions       Procedures/Studies: DG Chest 2 View  Result Date: 06/16/2020 CLINICAL DATA:  [place], bilateral hip pain EXAM: CHEST - 2 VIEW COMPARISON:  05/29/2017 FINDINGS: Frontal and lateral views of the chest demonstrate an enlarged cardiac silhouette. Postsurgical changes from median sternotomy and mitral valve replacement. Background scarring again noted without focal consolidation, effusion, or pneumothorax. No acute bony abnormalities. IMPRESSION: 1. Stable exam, no acute process. Electronically Signed   By:  Randa Ngo M.D.   On: 06/16/2020 16:55   CT Head Wo Contrast  Result Date: 06/16/2020 CLINICAL DATA:  [place], hit head, hypertension EXAM: CT HEAD WITHOUT CONTRAST TECHNIQUE: Contiguous axial images were obtained from the base of the skull through the vertex without intravenous contrast. COMPARISON:  05/13/2018 FINDINGS: Brain: Scattered areas of chronic small vessel ischemic changes are seen throughout the periventricular white matter, stable. Chronic encephalomalacia right frontal lobe from prior cortical infarct. No acute infarct or hemorrhage. Lateral ventricles and midline structures are unremarkable. No acute extra-axial fluid collections. No mass effect. Vascular: No hyperdense vessel or unexpected calcification. Skull: Normal. Negative for fracture or focal lesion. Sinuses/Orbits: No acute finding. Other: None. IMPRESSION: 1. Chronic ischemic changes as above, no acute intracranial process. Electronically Signed   By: Randa Ngo M.D.   On: 06/16/2020 18:54   CT Cervical Spine Wo Contrast  Result Date: 06/16/2020 CLINICAL DATA:  [place], hypertension EXAM: CT CERVICAL SPINE WITHOUT CONTRAST  TECHNIQUE: Multidetector CT imaging of the cervical spine was performed without intravenous contrast. Multiplanar CT image reconstructions were also generated. COMPARISON:  05/13/2018 FINDINGS: Alignment: Alignment is anatomic. Skull base and vertebrae: No acute fracture. No primary bone lesion or focal pathologic process. Soft tissues and spinal canal: No prevertebral fluid or swelling. No visible canal hematoma. Disc levels: Extensive multilevel spondylosis and facet hypertrophy is unchanged. Bony fusion across the C2/C3 facet joints. Upper chest: Airway is patent.  Lung apices are clear. Other: Reconstructed images demonstrate no additional findings. IMPRESSION: 1. No acute cervical spine fracture. Stable multilevel degenerative changes. Electronically Signed   By: Randa Ngo M.D.   On: 06/16/2020 18:57   CT PELVIS WO CONTRAST  Result Date: 06/16/2020 CLINICAL DATA:  Fall, bilateral hip pain, abnormal x-ray EXAM: CT PELVIS WITHOUT CONTRAST TECHNIQUE: Multidetector CT imaging of the pelvis was performed following the standard protocol without intravenous contrast. COMPARISON:  None. FINDINGS: Urinary Tract: 3 mm nonobstructing calculus noted within the visualized lower pole of the right kidney. Vascular calcifications are noted within the visualized renal hila. Preserved renal cortical thickness. No hydronephrosis. The bladder is decompressed, but appears relatively thick walled and demonstrates mild perivesicular inflammatory stranding suggesting a superimposed infectious or inflammatory cystitis. Bowel:  Unremarkable.  No free intraperitoneal fluid Vascular/Lymphatic: Extensive aortoiliac atherosclerotic calcification. No aortic aneurysm. No pathologic adenopathy within the abdomen and pelvis. Reproductive: 2.7 cm incompletely evaluated but simple appearing cyst noted within the right ovary. Uterus absent., Other: Infiltrative soft tissue within the left pelvic sidewall and left operator internus  musculature is in keeping with intramuscular and interstitial hemorrhage related to multiple pelvic fractures described below. No abdominal wall hernia identified. Musculoskeletal: Left hip ORIF utilizing a gamma nail has been performed, partially visualized. The osseous structures are diffusely osteopenic. There are acute fractures of the left ischium with minimal displacement of the inferior ischial fracture fragment comprising the origin of the hamstring musculature. Additionally, there is a a minimally displaced fracture of the left sacral ala extending to the caudal sacroiliac joint with minimal displacement of the fracture fragment. Advanced degenerative changes are seen within the lumbar spine. Advanced degenerative changes are seen within the hips bilaterally. No dislocation. IMPRESSION: Acute, minimally displaced fractures of the left ischium and left sacral ala. Femoral heads are still seated within the acetabula bilaterally. Superimposed severe degenerative change within the lumbar spine and hips bilaterally. Interstitial and intramuscular hemorrhage within the left pelvic sidewall and left operator internus. 2.7 cm incompletely evaluated simple appearing cyst within the  right ovary. Further follow-up is not required given its size. Bladder wall thickening and perivesicular inflammatory stranding suggesting a diffuse infectious or inflammatory cystitis. Correlation with urinalysis and urine culture may be helpful. Minimal nonobstructing right nephrolithiasis. Aortic Atherosclerosis (ICD10-I70.0). Electronically Signed   By: Fidela Salisbury MD   On: 06/16/2020 20:29   DG Hips Bilat W or Wo Pelvis 3-4 Views  Result Date: 06/16/2020 CLINICAL DATA:  86 year old female with fall and bilateral hip pain. EXAM: DG HIP (WITH OR WITHOUT PELVIS) 3-4V BILAT COMPARISON:  Left hip radiograph dated 06/09/2017. FINDINGS: Evaluation for fracture is very limited due to advanced osteopenia. There is faint linear lucency  through the left ischial tuberosity and left inferior pubic ramus which may be artifactual. Nondisplaced fracture is not excluded. Correlation with point tenderness recommended. No other acute fracture identified. There is no dislocation. Prior ORIF of the left femoral fracture. The hardware is intact. The soft tissues are unremarkable. Vascular calcifications noted. IMPRESSION: 1. Artifact versus less likely nondisplaced fracture of the left ischial tuberosity and left inferior pubic ramus. Correlation with point tenderness recommended. 2. No other acute fracture identified. No dislocation. 3. Advanced osteopenia. Electronically Signed   By: Anner Crete M.D.   On: 06/16/2020 16:59      Subjective: Pt reports no specific complaints today.  Asking for breakfast.    Discharge Exam: Vitals:   06/20/20 0800 06/20/20 0909  BP: (!) 121/105 122/71  Pulse: 82 78  Resp: 15   Temp: 98.5 F (36.9 C)   SpO2: 99% 99%   Vitals:   06/19/20 1419 06/20/20 0519 06/20/20 0800 06/20/20 0909  BP: (!) 141/72 (!) 164/77 (!) 121/105 122/71  Pulse: (!) 54 72 82 78  Resp: 16 16 15    Temp: 98 F (36.7 C) 99.8 F (37.7 C) 98.5 F (36.9 C)   TempSrc: Oral Oral Oral   SpO2: 90% 99% 99% 99%  Weight:      Height:       General exam: elderly fraile female with dementia, Appears calm and comfortable  Respiratory system: Clear to auscultation. Respiratory effort normal. Cardiovascular system: normal S1 & S2 heard. No JVD, murmurs, rubs, gallops or clicks. No pedal edema. Gastrointestinal system: Abdomen is nondistended, soft and nontender. No organomegaly or masses felt. Normal bowel sounds heard. Central nervous system: Alert and confused. No focal neurological deficits. Extremities: Symmetric 5 x 5 power. Skin: No rashes, lesions or ulcers Psychiatry:  Mood & affect appropriate.    The results of significant diagnostics from this hospitalization (including imaging, microbiology, ancillary and laboratory)  are listed below for reference.     Microbiology: Recent Results (from the past 240 hour(s))  Respiratory Panel by RT PCR (Flu A&B, Covid) - Nasopharyngeal Swab     Status: None   Collection Time: 06/16/20 10:02 PM   Specimen: Nasopharyngeal Swab  Result Value Ref Range Status   SARS Coronavirus 2 by RT PCR NEGATIVE NEGATIVE Final    Comment: (NOTE) SARS-CoV-2 target nucleic acids are NOT DETECTED.  The SARS-CoV-2 RNA is generally detectable in upper respiratoy specimens during the acute phase of infection. The lowest concentration of SARS-CoV-2 viral copies this assay can detect is 131 copies/mL. A negative result does not preclude SARS-Cov-2 infection and should not be used as the sole basis for treatment or other patient management decisions. A negative result may occur with  improper specimen collection/handling, submission of specimen other than nasopharyngeal swab, presence of viral mutation(s) within the areas targeted by this assay,  and inadequate number of viral copies (<131 copies/mL). A negative result must be combined with clinical observations, patient history, and epidemiological information. The expected result is Negative.  Fact Sheet for Patients:  PinkCheek.be  Fact Sheet for Healthcare Providers:  GravelBags.it  This test is no t yet approved or cleared by the [place] FDA and  has been authorized for detection and/or diagnosis of SARS-CoV-2 by FDA under an Emergency Use Authorization (EUA). This EUA will remain  in effect (meaning this test can be used) for the duration of the COVID-19 declaration under Section 564(b)(1) of the Act, 21 U.S.C. section 360bbb-3(b)(1), unless the authorization is terminated or revoked sooner.     Influenza A by PCR NEGATIVE NEGATIVE Final   Influenza B by PCR NEGATIVE NEGATIVE Final    Comment: (NOTE) The Xpert Xpress SARS-CoV-2/FLU/RSV assay is intended as an  aid in  the diagnosis of influenza from Nasopharyngeal swab specimens and  should not be used as a sole basis for treatment. Nasal washings and  aspirates are unacceptable for Xpert Xpress SARS-CoV-2/FLU/RSV  testing.  Fact Sheet for Patients: PinkCheek.be  Fact Sheet for Healthcare Providers: GravelBags.it  This test is not yet approved or cleared by the [place] FDA and  has been authorized for detection and/or diagnosis of SARS-CoV-2 by  FDA under an Emergency Use Authorization (EUA). This EUA will remain  in effect (meaning this test can be used) for the duration of the  Covid-19 declaration under Section 564(b)(1) of the Act, 21  U.S.C. section 360bbb-3(b)(1), unless the authorization is  terminated or revoked. Performed at [facility], [address], [place], [place] [address]      Labs: BNP (last 3 results) No results for input(s): BNP in the last 8760 hours. Basic Metabolic Panel: Recent Labs  Lab 06/16/20 1650 06/17/20 0351  NA 141 138  K 4.1 4.2  CL 106 104  CO2 29 26  GLUCOSE 103* 113*  BUN 22 22  CREATININE 0.78 0.89  CALCIUM 9.6 9.2   Liver Function Tests: Recent Labs  Lab 06/16/20 1650  AST 28  ALT 26  ALKPHOS 67  BILITOT 0.8  PROT 6.6  ALBUMIN 4.0   No results for input(s): LIPASE, AMYLASE in the last 168 hours. No results for input(s): AMMONIA in the last 168 hours. CBC: Recent Labs  Lab 06/16/20 1650 06/17/20 0351 06/18/20 0745 06/19/20 0722 06/20/20 0751  WBC 8.5 6.1 6.5 7.2 6.7  NEUTROABS 7.1  --   --   --   --   HGB 11.9* 10.9* 10.0* 10.8* 10.4*  HCT 38.5 35.1* 32.6* 34.3* 33.4*  MCV 92.5 92.1 90.6 90.3 92.3  PLT 162 120* 107* 118* 145*   Cardiac Enzymes: No results for input(s): CKTOTAL, CKMB, CKMBINDEX, TROPONINI in the last 168 hours. BNP: Invalid input(s): POCBNP CBG: No results for input(s): GLUCAP in the last 168 hours. D-Dimer No results for input(s):  DDIMER in the last 72 hours. Hgb A1c No results for input(s): HGBA1C in the last 72 hours. Lipid Profile No results for input(s): CHOL, HDL, LDLCALC, TRIG, CHOLHDL, LDLDIRECT in the last 72 hours. Thyroid function studies No results for input(s): TSH, T4TOTAL, T3FREE, THYROIDAB in the last 72 hours.  Invalid input(s): FREET3 Anemia work up No results for input(s): VITAMINB12, FOLATE, FERRITIN, TIBC, IRON, RETICCTPCT in the last 72 hours. Urinalysis    Component Value Date/Time   COLORURINE RED (A) 06/16/2020 1725   APPEARANCEUR TURBID (A) 06/16/2020 1725   LABSPEC  06/16/2020  1725    TEST NOT REPORTED DUE TO COLOR INTERFERENCE OF URINE PIGMENT   PHURINE  06/16/2020 1725    TEST NOT REPORTED DUE TO COLOR INTERFERENCE OF URINE PIGMENT   GLUCOSEU (A) 06/16/2020 1725    TEST NOT REPORTED DUE TO COLOR INTERFERENCE OF URINE PIGMENT   HGBUR (A) 06/16/2020 1725    TEST NOT REPORTED DUE TO COLOR INTERFERENCE OF URINE PIGMENT   BILIRUBINUR (A) 06/16/2020 1725    TEST NOT REPORTED DUE TO COLOR INTERFERENCE OF URINE PIGMENT   KETONESUR (A) 06/16/2020 1725    TEST NOT REPORTED DUE TO COLOR INTERFERENCE OF URINE PIGMENT   PROTEINUR (A) 06/16/2020 1725    TEST NOT REPORTED DUE TO COLOR INTERFERENCE OF URINE PIGMENT   NITRITE (A) 06/16/2020 1725    TEST NOT REPORTED DUE TO COLOR INTERFERENCE OF URINE PIGMENT   LEUKOCYTESUR (A) 06/16/2020 1725    TEST NOT REPORTED DUE TO COLOR INTERFERENCE OF URINE PIGMENT   Sepsis Labs Invalid input(s): PROCALCITONIN,  WBC,  LACTICIDVEN Microbiology Recent Results (from the past 240 hour(s))  Respiratory Panel by RT PCR (Flu A&B, Covid) - Nasopharyngeal Swab     Status: None   Collection Time: 06/16/20 10:02 PM   Specimen: Nasopharyngeal Swab  Result Value Ref Range Status   SARS Coronavirus 2 by RT PCR NEGATIVE NEGATIVE Final    Comment: (NOTE) SARS-CoV-2 target nucleic acids are NOT DETECTED.  The SARS-CoV-2 RNA is generally detectable in upper  respiratoy specimens during the acute phase of infection. The lowest concentration of SARS-CoV-2 viral copies this assay can detect is 131 copies/mL. A negative result does not preclude SARS-Cov-2 infection and should not be used as the sole basis for treatment or other patient management decisions. A negative result may occur with  improper specimen collection/handling, submission of specimen other than nasopharyngeal swab, presence of viral mutation(s) within the areas targeted by this assay, and inadequate number of viral copies (<131 copies/mL). A negative result must be combined with clinical observations, patient history, and epidemiological information. The expected result is Negative.  Fact Sheet for Patients:  PinkCheek.be  Fact Sheet for Healthcare Providers:  GravelBags.it  This test is no t yet approved or cleared by the [place] FDA and  has been authorized for detection and/or diagnosis of SARS-CoV-2 by FDA under an Emergency Use Authorization (EUA). This EUA will remain  in effect (meaning this test can be used) for the duration of the COVID-19 declaration under Section 564(b)(1) of the Act, 21 U.S.C. section 360bbb-3(b)(1), unless the authorization is terminated or revoked sooner.     Influenza A by PCR NEGATIVE NEGATIVE Final   Influenza B by PCR NEGATIVE NEGATIVE Final    Comment: (NOTE) The Xpert Xpress SARS-CoV-2/FLU/RSV assay is intended as an aid in  the diagnosis of influenza from Nasopharyngeal swab specimens and  should not be used as a sole basis for treatment. Nasal washings and  aspirates are unacceptable for Xpert Xpress SARS-CoV-2/FLU/RSV  testing.  Fact Sheet for Patients: PinkCheek.be  Fact Sheet for Healthcare Providers: GravelBags.it  This test is not yet approved or cleared by the [place] FDA and  has been  authorized for detection and/or diagnosis of SARS-CoV-2 by  FDA under an Emergency Use Authorization (EUA). This EUA will remain  in effect (meaning this test can be used) for the duration of the  Covid-19 declaration under Section 564(b)(1) of the Act, 21  U.S.C. section 360bbb-3(b)(1), unless the authorization is  terminated or revoked.  Performed at [facility], [address] [place], [place] [address]    Time coordinating discharge: 40 mins  SIGNED:  Irwin Brakeman, MD  [facility] 06/20/2020, 10:13 AM How to contact the [facility] Attending or Consulting provider [place] or covering provider during after hours [place], for this patient?  1. Check the care team in [facility] and look for a) attending/consulting [facility] provider listed and b) the [facility] team listed 2. Log into [email or web address] and use [place] universal password to access. If you do not have the password, please contact the hospital operator. 3. Locate the [facility] provider you are looking for under [facility] and page to a number that you can be directly reached. 4. If you still have difficulty reaching the provider, please page the [facility] (Director on Call) for the Hospitalists listed on amion for assistance.

## 2020-06-22 ENCOUNTER — Telehealth (INDEPENDENT_AMBULATORY_CARE_PROVIDER_SITE_OTHER): Payer: Self-pay

## 2020-06-22 NOTE — Telephone Encounter (Signed)
SON FEELS SHE WAS LEFT GO FROM HOSPITAL TOO SOON FOR THE TERRABLE FALL AND NOW Fx'S SHE HAS . HE IS IN WAITING AREA AND WILL SEE WHAT HE CAN DO ANT THIS TIME. ASKED TO LEAVE A PHONE NUMBER; MR K[place]oka SAID HE WILL WAIT UNTIL HE CA SEE DR Anastasio [place].

## 2020-06-22 NOTE — Telephone Encounter (Signed)
I told him to talk to the facility as he has healthcare power of attorney.  If he does take his mother home, and if he wants, we could order home health physical therapy.  Lets wait and see what happens.

## 2020-07-17 NOTE — Progress Notes (Deleted)
Cardiology Office Note   Date:  07/17/2020   ID:  Courtney [name], DOB 1928-12-21, MRN [id number]  PCP:  Doree Albee, MD  Cardiologist:   Kate Sable   No chief complaint on file.     History of Present Illness: Courtney [name] is a 86 y.o. female who presents for f/u regarding mitral valve disease and chronic atrial fibrillation  She has a history of CVA, HTN, vascular dementia MVR and chronic afib. MVR around 11-01-00 done at [facility] in [place] by Dr Leeroy Bock . CHADS2VASD is 6. Echo ordered by Dr Bronson Ing Nov 02, 2015 preop hip fracture reviewed  EF 60-65% AV sclerosis St Jude mechanical MV normal function  Severe biatrial enlargement  Estimated PA pressure 65 mmHg   INR;s have been Rx  Her notes indicate she had cardio embolic stroke on coumadin alone and she Has been maintained on plavix and coumadin since  She ambulates with walker in house and cane outside.  No cardiac concerns  Divorced husband MINTA FAIR died 01-Nov-2017 was a principle with doctorate in education from [employer] But cheated on Courtney [name] quite a bit  Son [name]  is taking care of her and wife who has dementia He is opposed to [place] fell 06/20/20 with non displaced pubic rami fracture   ***   Past Medical History:  Diagnosis Date  . Biatrial enlargement 12/01/2015  . Chronic anticoagulation    coumadin, CHADS2VASC=6  . History of mitral valve replacement   . Hypertension   . Hypothyroidism   . Osteoporosis    on Boniva  . Permanent atrial fibrillation ([place])   . Pulmonary hypertension, moderate to severe ([place]) 12/01/2015  . Stroke [facility])    on Clopidrigel  . UTI (lower urinary tract infection) 07/2015   n&v; AKI  . Vascular dementia [facility])     Past Surgical History:  Procedure Laterality Date  . ABDOMINAL HYSTERECTOMY    . FRACTURE SURGERY  [id number]   Operative repair with intramedullary nail intratrochanteric Left Hip fracture  . INTRAMEDULLARY (IM) NAIL INTERTROCHANTERIC Left  12/01/2015   Procedure: INTRAMEDULLARY (IM) NAIL INTERTROCHANTRIC;  Surgeon: Carole Civil, MD;  Location: [facility];  Service: Orthopedics;  Laterality: Left;  . MITRAL VALVE REPLACEMENT       Current Outpatient Medications  Medication Sig Dispense Refill  . acetaminophen (TYLENOL) 500 MG tablet Take 500 mg by mouth every 6 (six) hours as needed for mild pain.    Marland Kitchen alendronate (FOSAMAX) 70 MG tablet TAKE 1 TABLET BY MOUTH EVERY WEDNESDAY (Patient taking differently: Take 70 mg by mouth once a week. ) 12 tablet 1  . amLODipine (NORVASC) 5 MG tablet Take 1 tablet (5 mg total) by mouth daily.    Marland Kitchen atenolol (TENORMIN) 25 MG tablet Take 12.5 mg by mouth daily.    Marland Kitchen b complex vitamins tablet Take 1 tablet by mouth daily.    . clopidogrel (PLAVIX) 75 MG tablet TAKE 1 TABLET BY MOUTH EVERY DAY 90 tablet 0  . levothyroxine (SYNTHROID) 100 MCG tablet TAKE 1 TABLET BY MOUTH EVERY DAY BEFORE BREAKFAST (Patient taking differently: Take 100 mcg by mouth daily before breakfast. ) 90 tablet 0  . lisinopril (ZESTRIL) 2.5 MG tablet TAKE 1 TABLET BY MOUTH EVERY DAY 90 tablet 0  . vitamin C (ASCORBIC ACID) 250 MG tablet Take 250 mg by mouth daily.     Marland Kitchen warfarin (COUMADIN) 2 MG tablet Take 4 mg Mon, Wed, and 11-02-2022 all other days  take 3 mg 90 tablet 1  . warfarin (COUMADIN) 3 MG tablet Take 4 mg Mon, Wed, and Friday all other days take 3 mg 72 tablet 2   No current facility-administered medications for this visit.    Allergies:   Penicillins and Tramadol hcl    Social History:  The patient  reports that she has quit smoking. Her smoking use included cigarettes. She has never used smokeless tobacco. She reports that she does not drink alcohol and does not use drugs.   Family History:  The patient's family history includes Heart disease in her father and mother; Suicidality in her son.    ROS:  Please see the history of present illness.   Otherwise, review of systems are positive for none.   All other  systems are reviewed and negative.    PHYSICAL EXAM: VS:  There were no vitals taken for this visit. , BMI There is no height or weight on file to calculate BMI. Affect appropriate Healthy:  appears stated age 80: normal Neck supple with no adenopathy JVP normal no bruits no thyromegaly Lungs clear with no wheezing and good diaphragmatic motion Heart:  S1/S2 no murmur, no rub, gallop or click PMI normal Abdomen: benighn, BS positve, no tenderness, no AAA no bruit.  No HSM or HJR Distal pulses intact with no bruits No edema Neuro non-focal Skin warm and dry No muscular weakness   EKG:  11/29/15 afib rate 66 LAD    Recent Labs: 11/02/2019: TSH 1.23 06/16/2020: ALT 26 06/17/2020: BUN 22; Creatinine, Ser 0.89; Potassium 4.2; Sodium 138 06/20/2020: Hemoglobin 10.4; Platelets 145    Lipid Panel No results found for: CHOL, TRIG, HDL, CHOLHDL, VLDL, LDLCALC, LDLDIRECT    Wt Readings from Last 3 Encounters:  06/17/20 52.2 kg  02/23/20 49.9 kg  01/20/20 50.3 kg      Other studies Reviewed: Additional studies/ records that were reviewed today include: notes from Dr Bronson Ing 2017 TTE notes from primary labs .   ASSESSMENT AND PLAN:  1.  MVR: normal exam no symptoms and echo with normal function 2017 [facility] 2. Anticoagulation INR Rx last 2.7 06/20/20  3. Chronic Afib ECG today with good rate control INR Rx 4. CVA distant functional ambulates with cane maintain plavix with coumadin 5. HTN: Well controlled.  Continue current medications and low sodium Dash type diet.   6. Thyroid continue replacement TSH normal 10/30/17  6. Advanced Directives:  ***   Current medicines are reviewed at length with the patient today.  The patient does not have concerns regarding medicines.  The following changes have been made:  no change  Labs/ tests ordered today include: Coumadin clinic   No orders of the defined types were placed in this encounter.    Disposition:   FU with  cardiology in a year     Signed, Jenkins Rouge, MD  07/17/2020 5:29 PM    [facility] [address], [place], [place]  [address] Phone: [phone]; Fax: [phone]

## 2020-07-24 ENCOUNTER — Ambulatory Visit: Payer: Medicare PPO | Admitting: Cardiovascular Disease

## 2020-08-05 DEATH — deceased

## 2020-08-25 ENCOUNTER — Ambulatory Visit: Payer: Self-pay | Admitting: *Deleted

## 2021-03-08 ENCOUNTER — Encounter (INDEPENDENT_AMBULATORY_CARE_PROVIDER_SITE_OTHER): Payer: Self-pay
